# Patient Record
Sex: Female | Born: 1975
Health system: Southern US, Community
[De-identification: ages and names within clinical notes are randomized; demographics above are authoritative.]

## PROBLEM LIST (undated history)

## (undated) DIAGNOSIS — N289 Disorder of kidney and ureter, unspecified: Secondary | ICD-10-CM

## (undated) DIAGNOSIS — R102 Pelvic and perineal pain: Secondary | ICD-10-CM

## (undated) DIAGNOSIS — IMO0002 Reserved for concepts with insufficient information to code with codable children: Secondary | ICD-10-CM

## (undated) DIAGNOSIS — M4800 Spinal stenosis, site unspecified: Secondary | ICD-10-CM

## (undated) DIAGNOSIS — K219 Gastro-esophageal reflux disease without esophagitis: Secondary | ICD-10-CM

## (undated) DIAGNOSIS — M797 Fibromyalgia: Secondary | ICD-10-CM

## (undated) DIAGNOSIS — F509 Eating disorder, unspecified: Secondary | ICD-10-CM

## (undated) DIAGNOSIS — D6804 Acquired von Willebrand disease: Secondary | ICD-10-CM

## (undated) DIAGNOSIS — F909 Attention-deficit hyperactivity disorder, unspecified type: Secondary | ICD-10-CM

## (undated) DIAGNOSIS — M7072 Other bursitis of hip, left hip: Secondary | ICD-10-CM

## (undated) DIAGNOSIS — F4310 Post-traumatic stress disorder, unspecified: Secondary | ICD-10-CM

## (undated) DIAGNOSIS — M7140 Calcium deposit in bursa, unspecified site: Secondary | ICD-10-CM

## (undated) DIAGNOSIS — N840 Polyp of corpus uteri: Secondary | ICD-10-CM

## (undated) DIAGNOSIS — F319 Bipolar disorder, unspecified: Secondary | ICD-10-CM

## (undated) DIAGNOSIS — F419 Anxiety disorder, unspecified: Secondary | ICD-10-CM

## (undated) DIAGNOSIS — M7071 Other bursitis of hip, right hip: Secondary | ICD-10-CM

## (undated) DIAGNOSIS — J309 Allergic rhinitis, unspecified: Secondary | ICD-10-CM

## (undated) DIAGNOSIS — R87629 Unspecified abnormal cytological findings in specimens from vagina: Secondary | ICD-10-CM

## (undated) DIAGNOSIS — D680 Von Willebrand's disease: Secondary | ICD-10-CM

## (undated) DIAGNOSIS — N200 Calculus of kidney: Secondary | ICD-10-CM

## (undated) DIAGNOSIS — N946 Dysmenorrhea, unspecified: Secondary | ICD-10-CM

## (undated) HISTORY — PX: HYSTEROSCOPY: SHX211

## (undated) HISTORY — DX: Reserved for concepts with insufficient information to code with codable children: IMO0002

## (undated) HISTORY — DX: Post-traumatic stress disorder, unspecified: F43.10

## (undated) HISTORY — DX: Unspecified abnormal cytological findings in specimens from vagina: R87.629

## (undated) HISTORY — DX: Dysmenorrhea, unspecified: N94.6

## (undated) HISTORY — DX: Pelvic and perineal pain: R10.2

## (undated) HISTORY — DX: Von Willebrand's disease: D68.0

## (undated) HISTORY — DX: Bipolar disorder, unspecified: F31.9

## (undated) HISTORY — DX: Allergic rhinitis, unspecified: J30.9

## (undated) HISTORY — DX: Attention-deficit hyperactivity disorder, unspecified type: F90.9

## (undated) HISTORY — DX: Polyp of corpus uteri: N84.0

## (undated) HISTORY — DX: Acquired von Willebrand disease: D68.04

## (undated) HISTORY — DX: Eating disorder, unspecified: F50.9

## (undated) HISTORY — DX: Calculus of kidney: N20.0

## (undated) HISTORY — DX: Calcium deposit in bursa, unspecified site: M71.40

## (undated) HISTORY — DX: Gastro-esophageal reflux disease without esophagitis: K21.9

## (undated) HISTORY — DX: Fibromyalgia: M79.7

## (undated) HISTORY — PX: LAPAROSCOPY ABDOMEN DIAGNOSTIC: PRO50

## (undated) HISTORY — PX: ABDOMINAL HYSTERECTOMY: SHX81

## (undated) HISTORY — DX: Spinal stenosis, site unspecified: M48.00

## (undated) HISTORY — PX: OOPHORECTOMY: SHX86

## (undated) HISTORY — PX: TUBAL LIGATION: SHX77

## (undated) HISTORY — DX: Other bursitis of hip, right hip: M70.72

## (undated) HISTORY — DX: Other bursitis of hip, right hip: M70.71

## (undated) HISTORY — DX: Anxiety disorder, unspecified: F41.9

---

## 2005-08-13 ENCOUNTER — Ambulatory Visit: Payer: Self-pay | Admitting: Obstetrics and Gynecology

## 2006-02-06 ENCOUNTER — Ambulatory Visit: Payer: Self-pay | Admitting: Internal Medicine

## 2006-04-16 ENCOUNTER — Emergency Department: Payer: Self-pay | Admitting: Emergency Medicine

## 2008-01-17 ENCOUNTER — Emergency Department: Payer: Self-pay | Admitting: Emergency Medicine

## 2008-02-19 ENCOUNTER — Emergency Department: Payer: Self-pay | Admitting: Emergency Medicine

## 2008-08-23 ENCOUNTER — Inpatient Hospital Stay: Payer: Self-pay | Admitting: Unknown Physician Specialty

## 2008-12-06 ENCOUNTER — Encounter: Payer: Self-pay | Admitting: Orthopedic Surgery

## 2008-12-23 ENCOUNTER — Encounter: Payer: Self-pay | Admitting: Orthopedic Surgery

## 2009-08-29 ENCOUNTER — Ambulatory Visit: Payer: Self-pay | Admitting: Obstetrics and Gynecology

## 2009-09-13 ENCOUNTER — Ambulatory Visit: Payer: Self-pay | Admitting: Obstetrics and Gynecology

## 2009-09-16 ENCOUNTER — Ambulatory Visit: Payer: Self-pay | Admitting: Obstetrics and Gynecology

## 2009-10-20 ENCOUNTER — Ambulatory Visit: Payer: Self-pay | Admitting: Urology

## 2009-11-12 ENCOUNTER — Ambulatory Visit: Payer: Self-pay | Admitting: Neurological Surgery

## 2010-03-25 ENCOUNTER — Ambulatory Visit: Payer: Self-pay | Admitting: Oncology

## 2010-04-13 ENCOUNTER — Ambulatory Visit: Payer: Self-pay | Admitting: Oncology

## 2010-04-25 ENCOUNTER — Ambulatory Visit: Payer: Self-pay | Admitting: Oncology

## 2010-05-04 ENCOUNTER — Ambulatory Visit: Payer: Self-pay | Admitting: Oncology

## 2010-05-25 ENCOUNTER — Ambulatory Visit: Payer: Self-pay | Admitting: Oncology

## 2010-06-25 LAB — HM PAP SMEAR

## 2010-12-03 ENCOUNTER — Emergency Department: Payer: Self-pay | Admitting: Emergency Medicine

## 2011-06-05 ENCOUNTER — Encounter: Payer: Self-pay | Admitting: Internal Medicine

## 2011-06-05 ENCOUNTER — Ambulatory Visit (INDEPENDENT_AMBULATORY_CARE_PROVIDER_SITE_OTHER): Payer: Medicaid Other | Admitting: Internal Medicine

## 2011-06-05 VITALS — BP 120/76 | HR 94 | Temp 98.2°F | Wt 165.0 lb

## 2011-06-05 DIAGNOSIS — G5600 Carpal tunnel syndrome, unspecified upper limb: Secondary | ICD-10-CM

## 2011-06-05 DIAGNOSIS — F319 Bipolar disorder, unspecified: Secondary | ICD-10-CM

## 2011-06-05 MED ORDER — LAMOTRIGINE 25 MG PO TABS: 25.0000 mg | ORAL_TABLET | Freq: Two times a day (BID) | ORAL | Status: DC

## 2011-06-05 MED ORDER — MELOXICAM 15 MG PO TABS: 15.0000 mg | ORAL_TABLET | Freq: Every day | ORAL | Status: AC

## 2011-06-05 MED ORDER — ALPRAZOLAM 0.5 MG PO TABS: 0.5000 mg | ORAL_TABLET | Freq: Three times a day (TID) | ORAL | Status: DC | PRN

## 2011-06-05 NOTE — Progress Notes (Signed)
Subjective:    Patient ID: Analyssa Hodges, female    DOB: 1976-06-14, 35 y.o.   MRN: [id number]  HPI 35 year old female presents for an acute visit. She was seen in our former clinic that I have not seen her in over a year. She reports that she was recently diagnosed with bipolar disorder at mental health clinic. She notes that she has had intermittent episodes of severe depression alternating with episodes of increased energy and anxiety. She has a long history of panic attacks in her and her husband report that these have been more severe recently. She reports that she lost her job because of her inability to control her panic attacks and anxiety. She also separated from her husband for a brief period. She was instructed at the mental Health Center to start a prescription of Lamictal. She did not start this medication. She was concerned about potential side effects. She reports that she has followup scheduled with a psychiatrist on 06/29/2011.  She also notes a recent history of carpal tunnel syndrome. She notes that she was seen by an orthopedic surgeon for this yesterday. She reports that EMG studies are pending. She was instructed to start a prednisone taper but opted not to because she was concerned about potential side effects. She is currently taking ibuprofen for pain relief with minimal improvement.  She reports that she would like to be considered for disability and is interested in having me fill out paperwork so that she can obtain disability benefits because of her bipolar disorder.  Outpatient Encounter Prescriptions as of 06/05/2011  Medication Sig Dispense Refill  . ALPRAZolam (XANAX) 0.5 MG tablet Take 1 tablet (0.5 mg total) by mouth 3 (three) times daily as needed for sleep or anxiety.  90 tablet  0  . lamoTRIgine (LAMICTAL) 25 MG tablet Take 1 tablet (25 mg total) by mouth 2 (two) times daily.  60 tablet  2  . meloxicam (MOBIC) 15 MG tablet Take 1 tablet (15 mg total) by mouth daily.   30 tablet  2    Review of Systems  Constitutional: Negative for fever, chills, appetite change, fatigue and unexpected weight change.  HENT: Negative for ear pain, congestion, sore throat, trouble swallowing, neck pain, voice change and sinus pressure.   Eyes: Negative for visual disturbance.  Respiratory: Negative for cough and shortness of breath.   Cardiovascular: Negative for chest pain, palpitations and leg swelling.  Gastrointestinal: Negative for abdominal pain.  Genitourinary: Negative for dysuria and flank pain.  Musculoskeletal: Positive for myalgias and arthralgias. Negative for gait problem.  Skin: Negative for color change and rash.  Neurological: Negative for dizziness and headaches.  Hematological: Negative for adenopathy. Does not bruise/bleed easily.  Psychiatric/Behavioral: Positive for behavioral problems, sleep disturbance, dysphoric mood and agitation. Negative for suicidal ideas. The patient is nervous/anxious.    BP 120/76  Pulse 94  Temp(Src) 98.2 F (36.8 C) (Oral)  Wt 165 lb (74.844 kg)  SpO2 97%     Objective:   Physical Exam  Constitutional: She is oriented to person, place, and time. She appears well-developed and well-nourished. No distress.  HENT:  Head: Normocephalic and atraumatic.  Right Ear: External ear normal.  Left Ear: External ear normal.  Nose: Nose normal.  Mouth/Throat: Oropharynx is clear and moist. No oropharyngeal exudate.  Eyes: Conjunctivae are normal. Pupils are equal, round, and reactive to light. Right eye exhibits no discharge. Left eye exhibits no discharge. No scleral icterus.  Neck: Normal range of motion. Neck supple.  No tracheal deviation present. No thyromegaly present.  Cardiovascular: Normal rate, regular rhythm, normal heart sounds and intact distal pulses.  Exam reveals no gallop and no friction rub.   No murmur heard. Pulmonary/Chest: Effort normal and breath sounds normal. No respiratory distress. She has no  wheezes. She has no rales. She exhibits no tenderness.  Musculoskeletal: Normal range of motion. She exhibits no edema and no tenderness.  Lymphadenopathy:    She has no cervical adenopathy.  Neurological: She is alert and oriented to person, place, and time. No cranial nerve deficit. She exhibits normal muscle tone. Coordination normal.  Skin: Skin is warm and dry. No rash noted. She is not diaphoretic. No erythema. No pallor.  Psychiatric: She has a normal mood and affect. Her behavior is normal. Judgment and thought content normal.          Assessment & Plan:  1. Bipolar disorder - Per pt, was diagnosed at Mental Health facility.  Encouraged her to take Lamictal as instructed. Will alprazolam as needed for severe episodes of panic. She has taken this medication in the past and tolerated it well. Encouraged her to followup with psychiatry. She will followup here in one month.  2. Carpal Tunnel syndrome - she is taking an excessive amount of ibuprofen. Will change to meloxicam. She will continue to follow with orthopedics. She will followup here in one month.

## 2011-07-06 ENCOUNTER — Ambulatory Visit: Payer: Medicaid Other | Admitting: Internal Medicine

## 2011-07-30 ENCOUNTER — Telehealth: Payer: Self-pay | Admitting: Internal Medicine

## 2011-07-30 DIAGNOSIS — F319 Bipolar disorder, unspecified: Secondary | ICD-10-CM

## 2011-07-30 MED ORDER — ALPRAZOLAM 0.5 MG PO TABS: 0.5000 mg | ORAL_TABLET | Freq: Three times a day (TID) | ORAL | Status: DC | PRN

## 2011-07-30 NOTE — Telephone Encounter (Signed)
Did she follow up with psychiatry? She did not keep follow up here.  We can refill one month only.

## 2011-08-01 NOTE — Telephone Encounter (Signed)
Left pt detailed VM

## 2012-03-16 ENCOUNTER — Emergency Department: Payer: Self-pay | Admitting: Emergency Medicine

## 2012-03-17 LAB — COMPREHENSIVE METABOLIC PANEL
Albumin: 3.5 g/dL (ref 3.4–5.0)
BUN: 12 mg/dL (ref 7–18)
Bilirubin,Total: 0.2 mg/dL (ref 0.2–1.0)
Calcium, Total: 9 mg/dL (ref 8.5–10.1)
Chloride: 107 mmol/L (ref 98–107)
EGFR (African American): >60
Glucose: 88 mg/dL (ref 65–99)
Osmolality: 277 (ref 275–301)
Potassium: 3.7 mmol/L (ref 3.5–5.1)
SGOT(AST): 21 U/L (ref 15–37)
Sodium: 139 mmol/L (ref 136–145)

## 2012-03-17 LAB — URINALYSIS, COMPLETE
Bilirubin,UR: NEGATIVE
Glucose,UR: NEGATIVE mg/dL (ref 0–75)
Ketone: NEGATIVE
Leukocyte Esterase: NEGATIVE
Nitrite: NEGATIVE
Ph: 5 (ref 4.5–8.0)
Specific Gravity: 1.028 (ref 1.003–1.030)
Squamous Epithelial: 5
WBC UR: 1 /HPF (ref 0–5)

## 2012-03-17 LAB — CBC
MCH: 30.5 pg (ref 26.0–34.0)
MCHC: 34.3 g/dL (ref 32.0–36.0)
MCV: 89 fL (ref 80–100)
Platelet: 244 10*3/uL (ref 150–440)
RBC: 4.8 10*6/uL (ref 3.80–5.20)
RDW: 13 % (ref 11.5–14.5)
WBC: 9.7 10*3/uL (ref 3.6–11.0)

## 2012-03-17 LAB — PREGNANCY, URINE: Pregnancy Test, Urine: NEGATIVE m[IU]/mL

## 2012-04-06 ENCOUNTER — Emergency Department: Payer: Self-pay | Admitting: Emergency Medicine

## 2012-04-06 LAB — COMPREHENSIVE METABOLIC PANEL
Albumin: 3.6 g/dL (ref 3.4–5.0)
Alkaline Phosphatase: 83 U/L (ref 50–136)
BUN: 11 mg/dL (ref 7–18)
Bilirubin,Total: 0.4 mg/dL (ref 0.2–1.0)
Chloride: 110 mmol/L — ABNORMAL HIGH (ref 98–107)
Co2: 22 mmol/L (ref 21–32)
EGFR (African American): >60
EGFR (Non-African Amer.): >60
SGOT(AST): 39 U/L — ABNORMAL HIGH (ref 15–37)
SGPT (ALT): 46 U/L (ref 12–78)
Total Protein: 7.6 g/dL (ref 6.4–8.2)

## 2012-04-06 LAB — URINALYSIS, COMPLETE
Bacteria: NONE SEEN
Bilirubin,UR: NEGATIVE
Blood: NEGATIVE
Ketone: NEGATIVE
Nitrite: NEGATIVE
Ph: 8 (ref 4.5–8.0)
Protein: NEGATIVE
RBC,UR: 7 /HPF (ref 0–5)
Specific Gravity: 1.024 (ref 1.003–1.030)
WBC UR: 2 /HPF (ref 0–5)

## 2012-04-06 LAB — CBC
HGB: 14.4 g/dL (ref 12.0–16.0)
MCH: 29.6 pg (ref 26.0–34.0)
MCHC: 33.8 g/dL (ref 32.0–36.0)
Platelet: 278 10*3/uL (ref 150–440)
RBC: 4.86 10*6/uL (ref 3.80–5.20)
RDW: 12.7 % (ref 11.5–14.5)
WBC: 7.2 10*3/uL (ref 3.6–11.0)

## 2012-05-16 ENCOUNTER — Emergency Department: Payer: Self-pay | Admitting: Emergency Medicine

## 2012-05-16 LAB — URINALYSIS, COMPLETE
Bilirubin,UR: NEGATIVE
Glucose,UR: NEGATIVE mg/dL (ref 0–75)
Ketone: NEGATIVE
Leukocyte Esterase: NEGATIVE
Nitrite: POSITIVE
Specific Gravity: 1.026 (ref 1.003–1.030)
Squamous Epithelial: 1
WBC UR: 3 /HPF (ref 0–5)

## 2012-05-16 LAB — BASIC METABOLIC PANEL
Calcium, Total: 9.2 mg/dL (ref 8.5–10.1)
Chloride: 107 mmol/L (ref 98–107)
Co2: 26 mmol/L (ref 21–32)
Osmolality: 274 (ref 275–301)
Potassium: 4.1 mmol/L (ref 3.5–5.1)

## 2012-05-16 LAB — CBC
HCT: 40.6 % (ref 35.0–47.0)
Platelet: 236 10*3/uL (ref 150–440)
RBC: 4.62 10*6/uL (ref 3.80–5.20)
RDW: 12.9 % (ref 11.5–14.5)
WBC: 6.8 10*3/uL (ref 3.6–11.0)

## 2012-05-23 ENCOUNTER — Encounter: Payer: Self-pay | Admitting: Internal Medicine

## 2012-05-23 ENCOUNTER — Ambulatory Visit (INDEPENDENT_AMBULATORY_CARE_PROVIDER_SITE_OTHER): Payer: Medicaid Other | Admitting: Internal Medicine

## 2012-05-23 ENCOUNTER — Telehealth: Payer: Self-pay | Admitting: Internal Medicine

## 2012-05-23 VITALS — BP 122/88 | HR 82 | Temp 98.1°F | Resp 16 | Ht 62.0 in | Wt 170.5 lb

## 2012-05-23 DIAGNOSIS — IMO0001 Reserved for inherently not codable concepts without codable children: Secondary | ICD-10-CM

## 2012-05-23 DIAGNOSIS — G8929 Other chronic pain: Secondary | ICD-10-CM | POA: Insufficient documentation

## 2012-05-23 DIAGNOSIS — N200 Calculus of kidney: Secondary | ICD-10-CM

## 2012-05-23 DIAGNOSIS — R102 Pelvic and perineal pain unspecified side: Secondary | ICD-10-CM

## 2012-05-23 DIAGNOSIS — M797 Fibromyalgia: Secondary | ICD-10-CM

## 2012-05-23 DIAGNOSIS — N949 Unspecified condition associated with female genital organs and menstrual cycle: Secondary | ICD-10-CM

## 2012-05-23 DIAGNOSIS — F319 Bipolar disorder, unspecified: Secondary | ICD-10-CM

## 2012-05-23 HISTORY — DX: Calculus of kidney: N20.0

## 2012-05-23 LAB — POCT URINALYSIS DIPSTICK
Glucose, UA: NEGATIVE
Nitrite, UA: NEGATIVE
Protein, UA: NEGATIVE
Spec Grav, UA: >=1.03
Urobilinogen, UA: 0.2

## 2012-05-23 MED ORDER — ALPRAZOLAM 0.5 MG PO TABS: 0.5000 mg | ORAL_TABLET | Freq: Three times a day (TID) | ORAL | Status: DC | PRN

## 2012-05-23 MED ORDER — HYDROCODONE-ACETAMINOPHEN 5-325 MG PO TABS: 1.0000 | ORAL_TABLET | Freq: Four times a day (QID) | ORAL | Status: DC | PRN

## 2012-05-23 MED ORDER — CIPROFLOXACIN HCL 500 MG PO TABS: 500.0000 mg | ORAL_TABLET | Freq: Two times a day (BID) | ORAL | Status: DC

## 2012-05-23 MED ORDER — ALPRAZOLAM 0.5 MG PO TABS: 0.5000 mg | ORAL_TABLET | Freq: Three times a day (TID) | ORAL | Status: AC | PRN

## 2012-05-23 NOTE — Assessment & Plan Note (Signed)
Pt with chronic right lower pelvic pain. S/p recent CT abd/pel at [facility], which pt reports was normal except for noted nephrolithasis. Also had laparoscopy in the past to evaluate symptoms and this was normal per her report. Will request previous records on CT and surgical evaluation.  Will also set up urogynecology evaluation.

## 2012-05-23 NOTE — Telephone Encounter (Signed)
I called patient to inform her that she would need to come and pay $50 before we could fax paperwork to [facility].

## 2012-05-23 NOTE — Assessment & Plan Note (Signed)
Recent diagnosis of nephrolithiasis in ED. Treated with Cipro and Hydrocodone. Continues to have right sided flank pain. Will repeat urinalysis today. Will request records on CT abdomen/pel and urinalysis from [facility]. Will set up urogynecology evaluation for nephrolithiasis and chronic right lower pelvic pain. Refill given on hydrocodone today.

## 2012-05-23 NOTE — Assessment & Plan Note (Signed)
Pt reports symptoms are controlled off medication. Currently using only xanax prn. Will continue.

## 2012-05-23 NOTE — Assessment & Plan Note (Signed)
Symptoms of chronic, diffuse pain with trigger points most consistent with fibromyalgia. We discussed initiating workup and evaluation including labs to look for inflammatory conditions such as lupus or RA, however pt does not currently have health insurance, so will hold off until coverage established for labs and rheumatology evaluation. Would favor adding Cymbalta if symptoms are persistent, interfering with quality of life. For now, will hold off, as addressing acute issue of nephrolithiasis and treating pain symptoms with hydrocodone.

## 2012-05-23 NOTE — Telephone Encounter (Signed)
Patient and her husband came by to discuss the $85.  I took patient and husband into exam room to discuss the payment.  Patient said that it wasn't told to her at time of visit that she would have to pay for the forms to be filled out and that she didn't have to last year. Husband used fowl language: "This is bullshit that we are charging an office visit and for the forms to be filled out". "We should find a cheaper doctor", mine would have filled the paperwork out at my visit". I explained to both the patient and husband that the charge was for Dr. Tilman Neat time for filling out paperwork.  You could tell by his body language that he wasn't happy and he kept saying that he couldn't afford it he just paid $60 for prescriptions.  I told patient that we would fax paperwork to [facility] and that she needed to come by on Thursday December 5th to pay for the forms.  We went ahead and faxed since patient was scared that she wouldn't be able to drive because she had 30 days to get the paperwork back to them. She apologized for her husbands actions and the way he was acting.

## 2012-05-23 NOTE — Telephone Encounter (Signed)
Please discharge pt from practice.
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
## 2012-05-27 ENCOUNTER — Telehealth: Payer: Self-pay | Admitting: Internal Medicine

## 2012-05-27 NOTE — Telephone Encounter (Signed)
NO. Absolutely not. If she has gone through an entire Rx for Hydrocodone in 5 DAYS!! Then she will need to be seen in the ED or urgent care.  She may have obstructing kidney stone, which needs to be addressed sooner than 12/20

## 2012-05-27 NOTE — Telephone Encounter (Signed)
Patient Information:  Caller Name: Maraki  Phone: [phone]  Patient: Casey Hodges, Casey Hodges  Gender: Female  DOB: 1976-01-14  Age: 36 Years  PCP: Ronna Polio (Adults only)  Pregnant: No   Symptoms  Reason For Call & Symptoms: Wants Rx for pain.  seen in office 05/23/12 for bilateral kidney stones.  Seen in ED 05/16/12.  Has appt with urogynecologist at [facility] 06/13/12.  Taking cipro and having blood in urine; states is almost out of hydrocodone now.  Wants refill.or something stronger.  Reviewed Health History In EMR: Yes  Reviewed Medications In EMR: Yes  Reviewed Allergies In EMR: Yes  Reviewed Surgeries / Procedures: Yes  Date of Onset of Symptoms: 05/16/2012 OB:  LMP: Unknown  Guideline(s) Used:  Flank Pain  Disposition Per Guideline:   See Today in Office  Reason For Disposition Reached:   Moderate pain (e.g., interferes with normal activities or awakens from sleep)  Advice Given:  N/A  Office Follow Up:  Does the office need to follow up with this patient?: Yes  Instructions For The Office: Requests Rx change/refill till can be seen by specialist 06/13/12 krs/can  Patient Refused Recommendation:  Patient Requests Prescription  Requests Rx change/refill till can be seen by specialist 06/13/12 krs/can  RN Note:  States pain is "unrelenting," and cannot sleep well.  States pain is not new, changed, or otherwise emergent.  Per protocol, advised recheck; declines appt at this time.  States would like change in Rx or refill to last till can be seen by specialist 06/13/12.  Info to office for provider review/Rx/callback.  May reach patient at [phone].  krs/can

## 2012-05-27 NOTE — Telephone Encounter (Signed)
I have filled out the paperwork and waiting for Dr. Dan Humphreys to finish her part.

## 2012-05-28 NOTE — Telephone Encounter (Signed)
Pt advised that she could get no more refills and that she needed to go to ED or Urgent care to rule out Kidney stone obstruction.

## 2012-05-29 NOTE — Telephone Encounter (Signed)
I have sent form to [facility] medical records department.

## 2012-07-29 ENCOUNTER — Emergency Department: Payer: Self-pay | Admitting: Emergency Medicine

## 2012-07-29 LAB — URINALYSIS, COMPLETE
Glucose,UR: NEGATIVE mg/dL (ref 0–75)
Ketone: NEGATIVE
Leukocyte Esterase: NEGATIVE
Nitrite: POSITIVE
Ph: 6 (ref 4.5–8.0)
Protein: NEGATIVE
RBC,UR: 3 /HPF (ref 0–5)

## 2012-07-29 LAB — CBC
HCT: 40.2 % (ref 35.0–47.0)
HGB: 13.2 g/dL (ref 12.0–16.0)
MCH: 28.9 pg (ref 26.0–34.0)
MCV: 88 fL (ref 80–100)
Platelet: 251 10*3/uL (ref 150–440)
RBC: 4.56 10*6/uL (ref 3.80–5.20)

## 2012-07-29 LAB — BASIC METABOLIC PANEL
Anion Gap: 6 — ABNORMAL LOW (ref 7–16)
BUN: 10 mg/dL (ref 7–18)
Chloride: 111 mmol/L — ABNORMAL HIGH (ref 98–107)
Creatinine: 0.72 mg/dL (ref 0.60–1.30)
EGFR (African American): >60
EGFR (Non-African Amer.): >60
Osmolality: 281 (ref 275–301)

## 2012-07-29 LAB — PREGNANCY, URINE: Pregnancy Test, Urine: NEGATIVE m[IU]/mL

## 2012-07-30 ENCOUNTER — Encounter: Payer: Self-pay | Admitting: Internal Medicine

## 2012-08-06 ENCOUNTER — Telehealth: Payer: Self-pay | Admitting: Internal Medicine

## 2012-08-06 NOTE — Telephone Encounter (Addendum)
Patient dismissed from [facility] by Ronna Polio, MD effective 05/28/2012. Dismissal letter sent out by certified / registered mail. rmf  Certified letter returned as undeliverable after three attempts, change of address listed on last sticker. Letter remailed to [address], [place], [place] [address]. rmf

## 2012-10-20 ENCOUNTER — Telehealth: Payer: Self-pay | Admitting: *Deleted

## 2012-10-20 NOTE — Telephone Encounter (Signed)
Pt called, requesting referral to rheumatologist for fibromyalgia. Please advise.

## 2012-10-20 NOTE — Telephone Encounter (Signed)
This pt has been discharged from the practice in 05/2012. She will need to establish with another primary care provider for referral.

## 2012-10-24 NOTE — Telephone Encounter (Signed)
Patient called back to check on the status of the referral and I advised her of the information below that says she had been discharged from the practice in December and she would need to find another PCP to refer her to the rheumatologist.  She did want to know the reason why she had been discharged. I advised patient I wasn't aware of the reason.

## 2013-03-17 ENCOUNTER — Emergency Department: Payer: Self-pay | Admitting: Emergency Medicine

## 2013-03-17 LAB — URINALYSIS, COMPLETE
Bilirubin,UR: NEGATIVE
Glucose,UR: NEGATIVE mg/dL (ref 0–75)
Ketone: NEGATIVE
Leukocyte Esterase: NEGATIVE
Nitrite: NEGATIVE
Ph: 6 (ref 4.5–8.0)
RBC,UR: 7 /HPF (ref 0–5)
Specific Gravity: 1.019 (ref 1.003–1.030)
Squamous Epithelial: 9
WBC UR: 4 /HPF (ref 0–5)

## 2013-03-17 LAB — CBC
HGB: 14.2 g/dL (ref 12.0–16.0)
Platelet: 267 10*3/uL (ref 150–440)
RBC: 4.78 10*6/uL (ref 3.80–5.20)
RDW: 13 % (ref 11.5–14.5)
WBC: 9.1 10*3/uL (ref 3.6–11.0)

## 2013-03-17 LAB — BASIC METABOLIC PANEL
Anion Gap: 5 — ABNORMAL LOW (ref 7–16)
BUN: 8 mg/dL (ref 7–18)
Calcium, Total: 9 mg/dL (ref 8.5–10.1)
Chloride: 109 mmol/L — ABNORMAL HIGH (ref 98–107)
Co2: 24 mmol/L (ref 21–32)
EGFR (African American): >60
Glucose: 103 mg/dL — ABNORMAL HIGH (ref 65–99)
Osmolality: 274 (ref 275–301)
Potassium: 3.6 mmol/L (ref 3.5–5.1)

## 2013-05-15 ENCOUNTER — Emergency Department: Payer: Self-pay | Admitting: Emergency Medicine

## 2013-05-15 LAB — COMPREHENSIVE METABOLIC PANEL
Albumin: 3.6 g/dL (ref 3.4–5.0)
Alkaline Phosphatase: 77 U/L (ref 50–136)
Anion Gap: 3 — ABNORMAL LOW (ref 7–16)
BUN: 10 mg/dL (ref 7–18)
Calcium, Total: 8.8 mg/dL (ref 8.5–10.1)
EGFR (African American): >60
EGFR (Non-African Amer.): >60
Glucose: 86 mg/dL (ref 65–99)
SGOT(AST): 25 U/L (ref 15–37)
SGPT (ALT): 37 U/L (ref 12–78)
Sodium: 139 mmol/L (ref 136–145)
Total Protein: 7.4 g/dL (ref 6.4–8.2)

## 2013-05-15 LAB — CBC
HCT: 42.2 % (ref 35.0–47.0)
MCH: 29.6 pg (ref 26.0–34.0)
MCHC: 33.6 g/dL (ref 32.0–36.0)
MCV: 88 fL (ref 80–100)
Platelet: 245 10*3/uL (ref 150–440)
RBC: 4.78 10*6/uL (ref 3.80–5.20)
WBC: 7.1 10*3/uL (ref 3.6–11.0)

## 2013-05-15 LAB — URINALYSIS, COMPLETE
Nitrite: POSITIVE
Protein: NEGATIVE
RBC,UR: 6 /HPF (ref 0–5)
Specific Gravity: 1.021 (ref 1.003–1.030)
Squamous Epithelial: 33

## 2013-05-15 LAB — DRUG SCREEN, URINE
Barbiturates, Ur Screen: NEGATIVE (ref ?–200)
Benzodiazepine, Ur Scrn: POSITIVE (ref ?–200)
Cannabinoid 50 Ng, Ur ~~LOC~~: POSITIVE (ref ?–50)
MDMA (Ecstasy)Ur Screen: NEGATIVE (ref ?–500)
Methadone, Ur Screen: NEGATIVE (ref ?–300)
Opiate, Ur Screen: NEGATIVE (ref ?–300)
Phencyclidine (PCP) Ur S: NEGATIVE (ref ?–25)
Tricyclic, Ur Screen: NEGATIVE (ref ?–1000)

## 2013-05-17 LAB — URINE CULTURE

## 2013-05-25 ENCOUNTER — Emergency Department: Payer: Self-pay | Admitting: Emergency Medicine

## 2013-05-25 LAB — COMPREHENSIVE METABOLIC PANEL
Albumin: 3.6 g/dL (ref 3.4–5.0)
Alkaline Phosphatase: 78 U/L
Anion Gap: 4 — ABNORMAL LOW (ref 7–16)
Bilirubin,Total: 0.3 mg/dL (ref 0.2–1.0)
Calcium, Total: 9.8 mg/dL (ref 8.5–10.1)
Chloride: 108 mmol/L — ABNORMAL HIGH (ref 98–107)
Co2: 25 mmol/L (ref 21–32)
Creatinine: 0.77 mg/dL (ref 0.60–1.30)
EGFR (African American): >60
EGFR (Non-African Amer.): >60
Glucose: 82 mg/dL (ref 65–99)
Osmolality: 273 (ref 275–301)
Potassium: 3.6 mmol/L (ref 3.5–5.1)
SGOT(AST): 23 U/L (ref 15–37)
SGPT (ALT): 32 U/L (ref 12–78)
Sodium: 137 mmol/L (ref 136–145)

## 2013-05-25 LAB — CBC
HCT: 40.9 % (ref 35.0–47.0)
HGB: 13.7 g/dL (ref 12.0–16.0)
MCH: 29.4 pg (ref 26.0–34.0)
MCHC: 33.5 g/dL (ref 32.0–36.0)
MCV: 88 fL (ref 80–100)
Platelet: 260 10*3/uL (ref 150–440)
RDW: 13.2 % (ref 11.5–14.5)

## 2013-05-25 LAB — URINALYSIS, COMPLETE
Bacteria: NONE SEEN
Ketone: NEGATIVE
Leukocyte Esterase: NEGATIVE
Nitrite: NEGATIVE
Ph: 6 (ref 4.5–8.0)
Protein: NEGATIVE
RBC,UR: 17 /HPF (ref 0–5)
Specific Gravity: 1.024 (ref 1.003–1.030)
Squamous Epithelial: 15
WBC UR: 1 /HPF (ref 0–5)

## 2013-05-26 ENCOUNTER — Emergency Department: Payer: Self-pay | Admitting: Emergency Medicine

## 2013-05-29 ENCOUNTER — Emergency Department (HOSPITAL_COMMUNITY)
Admission: EM | Admit: 2013-05-29 | Discharge: 2013-05-29 | Disposition: A | Discharge status: Home / Self Care | Payer: Medicaid Other | Attending: Emergency Medicine | Admitting: Emergency Medicine

## 2013-05-29 ENCOUNTER — Encounter (HOSPITAL_COMMUNITY): Payer: Self-pay | Admitting: Emergency Medicine

## 2013-05-29 DIAGNOSIS — Z8742 Personal history of other diseases of the female genital tract: Secondary | ICD-10-CM | POA: Insufficient documentation

## 2013-05-29 DIAGNOSIS — R109 Unspecified abdominal pain: Secondary | ICD-10-CM | POA: Insufficient documentation

## 2013-05-29 DIAGNOSIS — R6883 Chills (without fever): Secondary | ICD-10-CM | POA: Insufficient documentation

## 2013-05-29 DIAGNOSIS — Z87448 Personal history of other diseases of urinary system: Secondary | ICD-10-CM | POA: Insufficient documentation

## 2013-05-29 DIAGNOSIS — Z9861 Coronary angioplasty status: Secondary | ICD-10-CM | POA: Insufficient documentation

## 2013-05-29 DIAGNOSIS — R110 Nausea: Secondary | ICD-10-CM | POA: Insufficient documentation

## 2013-05-29 DIAGNOSIS — Z87442 Personal history of urinary calculi: Secondary | ICD-10-CM | POA: Insufficient documentation

## 2013-05-29 DIAGNOSIS — Z8659 Personal history of other mental and behavioral disorders: Secondary | ICD-10-CM | POA: Insufficient documentation

## 2013-05-29 DIAGNOSIS — F17200 Nicotine dependence, unspecified, uncomplicated: Secondary | ICD-10-CM | POA: Insufficient documentation

## 2013-05-29 DIAGNOSIS — Z3202 Encounter for pregnancy test, result negative: Secondary | ICD-10-CM | POA: Insufficient documentation

## 2013-05-29 LAB — CBC WITH DIFFERENTIAL/PLATELET
Basophils Absolute: 0 10*3/uL (ref 0.0–0.1)
Basophils Relative: 0 % (ref 0–1)
Eosinophils Relative: 1 % (ref 0–5)
Lymphocytes Relative: 44 % (ref 12–46)
MCHC: 33.8 g/dL (ref 30.0–36.0)
Monocytes Absolute: 0.5 10*3/uL (ref 0.1–1.0)
Neutro Abs: 4.4 10*3/uL (ref 1.7–7.7)
Neutrophils Relative %: 49 % (ref 43–77)
Platelets: 246 10*3/uL (ref 150–400)
RBC: 4.38 MIL/uL (ref 3.87–5.11)
RDW: 13.1 % (ref 11.5–15.5)
WBC: 9 10*3/uL (ref 4.0–10.5)

## 2013-05-29 LAB — COMPREHENSIVE METABOLIC PANEL
ALT: 19 U/L (ref 0–35)
AST: 21 U/L (ref 0–37)
Albumin: 3.3 g/dL — ABNORMAL LOW (ref 3.5–5.2)
CO2: 23 mEq/L (ref 19–32)
Calcium: 9.1 mg/dL (ref 8.4–10.5)
Chloride: 104 mEq/L (ref 96–112)
Creatinine, Ser: 0.68 mg/dL (ref 0.50–1.10)
GFR calc non Af Amer: >90 mL/min (ref >90)
Potassium: 3.8 mEq/L (ref 3.5–5.1)
Sodium: 137 mEq/L (ref 135–145)
Total Bilirubin: 0.1 mg/dL — ABNORMAL LOW (ref 0.3–1.2)
Total Protein: 6.8 g/dL (ref 6.0–8.3)

## 2013-05-29 LAB — POCT PREGNANCY, URINE: Preg Test, Ur: NEGATIVE

## 2013-05-29 LAB — URINALYSIS, ROUTINE W REFLEX MICROSCOPIC
Glucose, UA: NEGATIVE mg/dL
Hgb urine dipstick: NEGATIVE
Leukocytes, UA: NEGATIVE
Protein, ur: NEGATIVE mg/dL
Specific Gravity, Urine: 1.021 (ref 1.005–1.030)
Urobilinogen, UA: 0.2 mg/dL (ref 0.0–1.0)

## 2013-05-29 MED ORDER — OXYCODONE-ACETAMINOPHEN 5-325 MG PO TABS: 1.0000 | ORAL_TABLET | ORAL | Status: DC | PRN

## 2013-05-29 MED ORDER — TAMSULOSIN HCL 0.4 MG PO CAPS: 0.4000 mg | ORAL_CAPSULE | Freq: Every day | ORAL | Status: DC

## 2013-05-29 MED ORDER — IBUPROFEN 800 MG PO TABS: 800.0000 mg | ORAL_TABLET | Freq: Three times a day (TID) | ORAL | Status: DC

## 2013-05-29 MED ORDER — PROMETHAZINE HCL 25 MG PO TABS: 25.0000 mg | ORAL_TABLET | Freq: Four times a day (QID) | ORAL | Status: DC | PRN

## 2013-05-29 MED ORDER — SODIUM CHLORIDE 0.9 % IV BOLUS (SEPSIS)
1000.0000 mL | Freq: Once | INTRAVENOUS | Status: AC
  Administered 2013-05-29: 1000 mL via INTRAVENOUS

## 2013-05-29 MED ORDER — KETOROLAC TROMETHAMINE 30 MG/ML IJ SOLN
30.0000 mg | Freq: Once | INTRAMUSCULAR | Status: AC
  Administered 2013-05-29: 30 mg via INTRAVENOUS
  Filled 2013-05-29: qty 1

## 2013-05-29 MED ORDER — MORPHINE SULFATE 4 MG/ML IJ SOLN
4.0000 mg | Freq: Once | INTRAMUSCULAR | Status: AC
  Administered 2013-05-29: 4 mg via INTRAVENOUS
  Filled 2013-05-29: qty 1

## 2013-05-29 MED ORDER — ONDANSETRON HCL 4 MG/2ML IJ SOLN
4.0000 mg | Freq: Once | INTRAMUSCULAR | Status: AC
  Administered 2013-05-29: 4 mg via INTRAVENOUS
  Filled 2013-05-29: qty 2

## 2013-05-29 NOTE — ED Notes (Signed)
Per pt recently treated for kidney infection. sts that she is done with the abx and still in a lot of pain. sts lower back, flank and pelvic pain.

## 2013-05-29 NOTE — ED Provider Notes (Signed)
TIME SEEN: 8:51 PM  CHIEF COMPLAINT: Left-sided flank pain  HPI: Patient is a 37 year old female with a history of ovarian cysts, recurrent polynephritis and kidney stones who has had one stent placed 16 years ago who presents the emergency department with complaints of left-sided flank pain. She reports that 2 weeks ago she was diagnosed with right-sided pyelonephritis and finished a course of Cipro on Sunday, 5 days ago. She reports that 3 days ago she began having return of her pain and went to [facility] and was diagnosed with a right-sided kidney stone. She reports that yesterday morning she began having sharp left-sided flank pain. She's also had chills and nausea but no vomiting or diarrhea. No dysuria or hematuria. No vaginal bleeding or discharge. She states her symptoms are similar to her prior kidney stones. Her pain radiates into her lower abdomen.  ROS: See HPI Constitutional: no fever  Eyes: no drainage  ENT: no runny nose   Cardiovascular:  no chest pain  Resp: no SOB  GI: no vomiting GU: no dysuria Integumentary: no rash  Allergy: no hives  Musculoskeletal: no leg swelling  Neurological: no slurred speech ROS otherwise negative  PAST MEDICAL HISTORY/PAST SURGICAL HISTORY:  Past Medical History  Diagnosis Date  . Bipolar affective     pt reported  . Uterine polyp   . Eating disorder     Under control per patient    MEDICATIONS:  Prior to Admission medications   Medication Sig Start Date End Date Taking? Authorizing Provider  ibuprofen (ADVIL,MOTRIN) 200 MG tablet Take 400 mg by mouth 2 (two) times daily as needed (pain).   Yes Historical Provider, MD  Melatonin 5 MG TABS Take 10 mg by mouth at bedtime.   Yes Historical Provider, MD  ciprofloxacin (CIPRO) 500 MG tablet Take 500 mg by mouth 2 (two) times daily. 7 day course completed 05/24/2013    Historical Provider, MD    ALLERGIES:  Allergies  Allergen Reactions  . Wellbutrin [Bupropion  Hcl] Other (See Comments)    Mental breakdown? Suicidal   . Lasix [Furosemide] Rash  . Nitrofurantoin Monohyd Macro Rash    SOCIAL HISTORY:  History  Substance Use Topics  . Smoking status: Current Every Day Smoker -- 1.00 packs/day  . Smokeless tobacco: Never Used  . Alcohol Use: Yes     Comment: Occasional    FAMILY HISTORY: History reviewed. No pertinent family history.  EXAM: BP 96/61  Pulse 85  Temp(Src) 97.2 F (36.2 C) (Oral)  Resp 16  Ht 5\' 2"  (1.575 m)  Wt 167 lb (75.751 kg)  BMI 30.54 kg/m2  SpO2 98% CONSTITUTIONAL: Alert and oriented and responds appropriately to questions. Well-appearing; well-nourished, appears uncomfortable on exam HEAD: Normocephalic EYES: Conjunctivae clear, PERRL ENT: normal nose; no rhinorrhea; moist mucous membranes; pharynx without lesions noted NECK: Supple, no meningismus, no LAD  CARD: RRR; S1 and S2 appreciated; no murmurs, no clicks, no rubs, no gallops RESP: Normal chest excursion without splinting or tachypnea; breath sounds clear and equal bilaterally; no wheezes, no rhonchi, no rales,  ABD/GI: Normal bowel sounds; non-distended; soft, non-tender, no rebound, no guarding BACK:  The back appears normal and is non-tender to palpation, right-sided mild CVA tenderness; no midline spinal tenderness, step-off or deformity EXT: Normal ROM in all joints; non-tender to palpation; no edema; normal capillary refill; no cyanosis    SKIN: Normal color for age and race; warm NEURO: Moves all extremities equally PSYCH: The patient's mood and manner are  appropriate. Grooming and personal hygiene are appropriate.  MEDICAL DECISION MAKING: Patient here with right-sided flank pain. She has a history of recurrent pyelonephritis and kidney stones. She appears uncomfortable on exam but nontoxic and is hemodynamically stable. Urine pregnancy test is negative and urine shows no blood or sign of infection. We'll check basic labs, give IV fluids,  antiemetics, pain medication and reassess. Patient has had multiple CT scans in the past. We'll attempt to avoid repeat imaging at this time if possible.  ED PROGRESS: Labs are unremarkable. Patient reports her pain is completely resolved after one dose of Toradol, morphine, Zofran. She reports she would like to be discharged home. We'll discharge with prescription for ibuprofen, Percocet, Flomax, Phenergan. Given return precautions. Patient verbalized understanding and is comfortable plan.     Layla Maw Nitesh Pitstick, DO 05/29/13 2240

## 2013-06-25 HISTORY — PX: LAPAROSCOPIC VAGINAL HYSTERECTOMY: SUR798

## 2013-09-18 ENCOUNTER — Emergency Department: Payer: Self-pay | Admitting: Emergency Medicine

## 2013-09-18 LAB — COMPREHENSIVE METABOLIC PANEL
ANION GAP: 6 — AB (ref 7–16)
AST: 29 U/L (ref 15–37)
Albumin: 3.6 g/dL (ref 3.4–5.0)
Alkaline Phosphatase: 89 U/L
BUN: 13 mg/dL (ref 7–18)
Bilirubin,Total: 0.2 mg/dL (ref 0.2–1.0)
CALCIUM: 9.3 mg/dL (ref 8.5–10.1)
CREATININE: 0.86 mg/dL (ref 0.60–1.30)
Chloride: 105 mmol/L (ref 98–107)
Co2: 26 mmol/L (ref 21–32)
EGFR (Non-African Amer.): >60
GLUCOSE: 92 mg/dL (ref 65–99)
Osmolality: 274 (ref 275–301)
POTASSIUM: 3.6 mmol/L (ref 3.5–5.1)
SGPT (ALT): 54 U/L (ref 12–78)
Sodium: 137 mmol/L (ref 136–145)
Total Protein: 7.9 g/dL (ref 6.4–8.2)

## 2013-09-18 LAB — URINALYSIS, COMPLETE
BILIRUBIN, UR: NEGATIVE
Glucose,UR: NEGATIVE mg/dL (ref 0–75)
Ketone: NEGATIVE
LEUKOCYTE ESTERASE: NEGATIVE
Nitrite: NEGATIVE
Ph: 6 (ref 4.5–8.0)
Protein: NEGATIVE
SPECIFIC GRAVITY: 1.018 (ref 1.003–1.030)
Squamous Epithelial: 5
WBC UR: 4 /HPF (ref 0–5)

## 2013-09-18 LAB — CBC
HCT: 41.3 % (ref 35.0–47.0)
HGB: 13.7 g/dL (ref 12.0–16.0)
MCH: 29.5 pg (ref 26.0–34.0)
MCHC: 33.2 g/dL (ref 32.0–36.0)
MCV: 89 fL (ref 80–100)
PLATELETS: 234 10*3/uL (ref 150–440)
RBC: 4.63 10*6/uL (ref 3.80–5.20)
RDW: 12.9 % (ref 11.5–14.5)
WBC: 9.4 10*3/uL (ref 3.6–11.0)

## 2013-09-22 ENCOUNTER — Emergency Department: Payer: Self-pay | Admitting: Emergency Medicine

## 2013-09-22 LAB — URINALYSIS, COMPLETE
Bacteria: NONE SEEN
Bilirubin,UR: NEGATIVE
Glucose,UR: NEGATIVE mg/dL (ref 0–75)
KETONE: NEGATIVE
LEUKOCYTE ESTERASE: NEGATIVE
Nitrite: NEGATIVE
PH: 6 (ref 4.5–8.0)
PROTEIN: NEGATIVE
SPECIFIC GRAVITY: 1.017 (ref 1.003–1.030)
Squamous Epithelial: 3
WBC UR: NONE SEEN /HPF (ref 0–5)

## 2013-09-22 LAB — COMPREHENSIVE METABOLIC PANEL
ALK PHOS: 85 U/L
ALT: 63 U/L (ref 12–78)
ANION GAP: 5 — AB (ref 7–16)
AST: 25 U/L (ref 15–37)
Albumin: 3.7 g/dL (ref 3.4–5.0)
BUN: 9 mg/dL (ref 7–18)
Bilirubin,Total: 0.2 mg/dL (ref 0.2–1.0)
Calcium, Total: 9.3 mg/dL (ref 8.5–10.1)
Chloride: 104 mmol/L (ref 98–107)
Co2: 28 mmol/L (ref 21–32)
Creatinine: 0.8 mg/dL (ref 0.60–1.30)
EGFR (African American): >60
GLUCOSE: 86 mg/dL (ref 65–99)
Osmolality: 272 (ref 275–301)
POTASSIUM: 3.8 mmol/L (ref 3.5–5.1)
Sodium: 137 mmol/L (ref 136–145)
Total Protein: 7.9 g/dL (ref 6.4–8.2)

## 2013-09-22 LAB — CBC WITH DIFFERENTIAL/PLATELET
BASOS ABS: 0.1 10*3/uL (ref 0.0–0.1)
BASOS PCT: 0.8 %
EOS ABS: 0.1 10*3/uL (ref 0.0–0.7)
EOS PCT: 0.8 %
HCT: 42.3 % (ref 35.0–47.0)
HGB: 13.7 g/dL (ref 12.0–16.0)
Lymphocyte #: 3.9 10*3/uL — ABNORMAL HIGH (ref 1.0–3.6)
Lymphocyte %: 33.3 %
MCH: 29 pg (ref 26.0–34.0)
MCHC: 32.4 g/dL (ref 32.0–36.0)
MCV: 89 fL (ref 80–100)
MONO ABS: 0.6 x10 3/mm (ref 0.2–0.9)
MONOS PCT: 5.5 %
NEUTROS PCT: 59.6 %
Neutrophil #: 6.9 10*3/uL — ABNORMAL HIGH (ref 1.4–6.5)
Platelet: 248 10*3/uL (ref 150–440)
RBC: 4.73 10*6/uL (ref 3.80–5.20)
RDW: 13.3 % (ref 11.5–14.5)
WBC: 11.6 10*3/uL — ABNORMAL HIGH (ref 3.6–11.0)

## 2013-09-22 LAB — LIPASE, BLOOD: LIPASE: 175 U/L (ref 73–393)

## 2013-11-03 ENCOUNTER — Emergency Department: Payer: Self-pay | Admitting: Emergency Medicine

## 2013-11-03 LAB — CBC
HCT: 42.1 % (ref 35.0–47.0)
HGB: 13.8 g/dL (ref 12.0–16.0)
MCH: 29.2 pg (ref 26.0–34.0)
MCHC: 32.7 g/dL (ref 32.0–36.0)
MCV: 89 fL (ref 80–100)
Platelet: 261 10*3/uL (ref 150–440)
RBC: 4.72 10*6/uL (ref 3.80–5.20)
RDW: 12.7 % (ref 11.5–14.5)
WBC: 8.6 10*3/uL (ref 3.6–11.0)

## 2013-11-03 LAB — URINALYSIS, COMPLETE
BACTERIA: NONE SEEN
BILIRUBIN, UR: NEGATIVE
GLUCOSE, UR: NEGATIVE mg/dL (ref 0–75)
Ketone: NEGATIVE
Nitrite: NEGATIVE
PH: 6 (ref 4.5–8.0)
Protein: 30
RBC,UR: 560 /HPF (ref 0–5)
SPECIFIC GRAVITY: 1.027 (ref 1.003–1.030)

## 2013-11-03 LAB — COMPREHENSIVE METABOLIC PANEL
Albumin: 3.7 g/dL (ref 3.4–5.0)
Alkaline Phosphatase: 77 U/L
Anion Gap: 8 (ref 7–16)
BUN: 11 mg/dL (ref 7–18)
Bilirubin,Total: 0.2 mg/dL (ref 0.2–1.0)
CALCIUM: 9.2 mg/dL (ref 8.5–10.1)
Chloride: 107 mmol/L (ref 98–107)
Co2: 23 mmol/L (ref 21–32)
Creatinine: 0.62 mg/dL (ref 0.60–1.30)
EGFR (African American): >60
GLUCOSE: 105 mg/dL — AB (ref 65–99)
Osmolality: 275 (ref 275–301)
Potassium: 4 mmol/L (ref 3.5–5.1)
SGOT(AST): 20 U/L (ref 15–37)
SGPT (ALT): 38 U/L (ref 12–78)
Sodium: 138 mmol/L (ref 136–145)
TOTAL PROTEIN: 7.8 g/dL (ref 6.4–8.2)

## 2013-11-22 ENCOUNTER — Emergency Department (HOSPITAL_COMMUNITY): Payer: Medicaid Other

## 2013-11-22 ENCOUNTER — Emergency Department (HOSPITAL_COMMUNITY)
Admission: EM | Admit: 2013-11-22 | Discharge: 2013-11-22 | Disposition: A | Discharge status: Home / Self Care | Payer: Medicaid Other | Attending: Emergency Medicine | Admitting: Emergency Medicine

## 2013-11-22 ENCOUNTER — Encounter (HOSPITAL_COMMUNITY): Payer: Self-pay | Admitting: Emergency Medicine

## 2013-11-22 DIAGNOSIS — F17200 Nicotine dependence, unspecified, uncomplicated: Secondary | ICD-10-CM | POA: Insufficient documentation

## 2013-11-22 DIAGNOSIS — N390 Urinary tract infection, site not specified: Secondary | ICD-10-CM | POA: Insufficient documentation

## 2013-11-22 DIAGNOSIS — Z79899 Other long term (current) drug therapy: Secondary | ICD-10-CM | POA: Insufficient documentation

## 2013-11-22 DIAGNOSIS — F319 Bipolar disorder, unspecified: Secondary | ICD-10-CM | POA: Insufficient documentation

## 2013-11-22 DIAGNOSIS — Z792 Long term (current) use of antibiotics: Secondary | ICD-10-CM | POA: Insufficient documentation

## 2013-11-22 DIAGNOSIS — Z87442 Personal history of urinary calculi: Secondary | ICD-10-CM | POA: Insufficient documentation

## 2013-11-22 DIAGNOSIS — R000 Tachycardia, unspecified: Secondary | ICD-10-CM | POA: Insufficient documentation

## 2013-11-22 HISTORY — DX: Disorder of kidney and ureter, unspecified: N28.9

## 2013-11-22 LAB — URINALYSIS, ROUTINE W REFLEX MICROSCOPIC
Bilirubin Urine: NEGATIVE
Glucose, UA: NEGATIVE mg/dL
KETONES UR: 15 mg/dL — AB
NITRITE: POSITIVE — AB
PH: 6 (ref 5.0–8.0)
Protein, ur: NEGATIVE mg/dL
Specific Gravity, Urine: 1.027 (ref 1.005–1.030)
UROBILINOGEN UA: 1 mg/dL (ref 0.0–1.0)

## 2013-11-22 LAB — CBC WITH DIFFERENTIAL/PLATELET
BASOS PCT: 0 % (ref 0–1)
Basophils Absolute: 0 10*3/uL (ref 0.0–0.1)
Eosinophils Absolute: 0.1 10*3/uL (ref 0.0–0.7)
Eosinophils Relative: 1 % (ref 0–5)
HCT: 39.5 % (ref 36.0–46.0)
Hemoglobin: 13 g/dL (ref 12.0–15.0)
LYMPHS PCT: 28 % (ref 12–46)
Lymphs Abs: 2.7 10*3/uL (ref 0.7–4.0)
MCH: 29.7 pg (ref 26.0–34.0)
MCHC: 32.9 g/dL (ref 30.0–36.0)
MCV: 90.4 fL (ref 78.0–100.0)
Monocytes Absolute: 0.6 10*3/uL (ref 0.1–1.0)
Monocytes Relative: 6 % (ref 3–12)
Neutro Abs: 6.2 10*3/uL (ref 1.7–7.7)
Neutrophils Relative %: 65 % (ref 43–77)
PLATELETS: 264 10*3/uL (ref 150–400)
RBC: 4.37 MIL/uL (ref 3.87–5.11)
RDW: 12.8 % (ref 11.5–15.5)
WBC: 9.6 10*3/uL (ref 4.0–10.5)

## 2013-11-22 LAB — BASIC METABOLIC PANEL
BUN: 14 mg/dL (ref 6–23)
CALCIUM: 9.8 mg/dL (ref 8.4–10.5)
CO2: 23 mEq/L (ref 19–32)
Chloride: 104 mEq/L (ref 96–112)
Creatinine, Ser: 0.91 mg/dL (ref 0.50–1.10)
GFR calc Af Amer: >90 mL/min (ref >90)
GFR, EST NON AFRICAN AMERICAN: 80 mL/min — AB (ref >90)
Glucose, Bld: 93 mg/dL (ref 70–99)
Potassium: 3.9 mEq/L (ref 3.7–5.3)
Sodium: 140 mEq/L (ref 137–147)

## 2013-11-22 LAB — URINE MICROSCOPIC-ADD ON

## 2013-11-22 MED ORDER — ONDANSETRON HCL 4 MG/2ML IJ SOLN
4.0000 mg | Freq: Once | INTRAMUSCULAR | Status: AC
  Administered 2013-11-22: 4 mg via INTRAVENOUS
  Filled 2013-11-22: qty 2

## 2013-11-22 MED ORDER — HYDROCODONE-ACETAMINOPHEN 5-325 MG PO TABS: 1.0000 | ORAL_TABLET | Freq: Four times a day (QID) | ORAL | Status: DC | PRN

## 2013-11-22 MED ORDER — MORPHINE SULFATE 4 MG/ML IJ SOLN
4.0000 mg | Freq: Once | INTRAMUSCULAR | Status: AC
Start: 2013-11-22 — End: 2013-11-22
  Administered 2013-11-22: 4 mg via INTRAVENOUS
  Filled 2013-11-22: qty 1

## 2013-11-22 MED ORDER — CEPHALEXIN 500 MG PO CAPS: 500.0000 mg | ORAL_CAPSULE | Freq: Four times a day (QID) | ORAL | Status: DC

## 2013-11-22 MED ORDER — DEXTROSE 5 % IV SOLN
1.0000 g | Freq: Once | INTRAVENOUS | Status: AC
  Administered 2013-11-22: 1 g via INTRAVENOUS
  Filled 2013-11-22: qty 10

## 2013-11-22 NOTE — ED Provider Notes (Signed)
1600 - care from Dr. Wilkie Aye. 50F here with R flank pain. UTI on UA, hx of kidney stones - awaiting Renal US to look for hydro concerning for stones. 1700 - Renal US normal. Given pain meds, keflex. Patient stable for discharge.  1. UTI (lower urinary tract infection)      Dagmar Hait, MD 11/22/13 [phone]

## 2013-11-22 NOTE — ED Notes (Signed)
Patient transported to Ultrasound 

## 2013-11-22 NOTE — ED Notes (Signed)
Per pt she began having R flank pain and lower back pain on Friday.  Pt reports hx of kidney stones and also spinal stenosis.

## 2013-11-22 NOTE — Discharge Instructions (Signed)
Urinary Tract Infection  Urinary tract infections (UTIs) can develop anywhere along your urinary tract. Your urinary tract is your body's drainage system for removing wastes and extra water. Your urinary tract includes two kidneys, two ureters, a bladder, and a urethra. Your kidneys are a pair of bean-shaped organs. Each kidney is about the size of your fist. They are located below your ribs, one on each side of your spine.  CAUSES  Infections are caused by microbes, which are microscopic organisms, including fungi, viruses, and bacteria. These organisms are so small that they can only be seen through a microscope. Bacteria are the microbes that most commonly cause UTIs.  SYMPTOMS   Symptoms of UTIs may vary by age and gender of the patient and by the location of the infection. Symptoms in young women typically include a frequent and intense urge to urinate and a painful, burning feeling in the bladder or urethra during urination. Older women and men are more likely to be tired, shaky, and weak and have muscle aches and abdominal pain. A fever may mean the infection is in your kidneys. Other symptoms of a kidney infection include pain in your back or sides below the ribs, nausea, and vomiting.  DIAGNOSIS  To diagnose a UTI, your caregiver will ask you about your symptoms. Your caregiver also will ask to provide a urine sample. The urine sample will be tested for bacteria and white blood cells. White blood cells are made by your body to help fight infection.  TREATMENT   Typically, UTIs can be treated with medication. Because most UTIs are caused by a bacterial infection, they usually can be treated with the use of antibiotics. The choice of antibiotic and length of treatment depend on your symptoms and the type of bacteria causing your infection.  HOME CARE INSTRUCTIONS   If you were prescribed antibiotics, take them exactly as your caregiver instructs you. Finish the medication even if you feel better after you  have only taken some of the medication.   Drink enough water and fluids to keep your urine clear or pale yellow.   Avoid caffeine, tea, and carbonated beverages. They tend to irritate your bladder.   Empty your bladder often. Avoid holding urine for long periods of time.   Empty your bladder before and after sexual intercourse.   After a bowel movement, women should cleanse from front to back. Use each tissue only once.  SEEK MEDICAL CARE IF:    You have back pain.   You develop a fever.   Your symptoms do not begin to resolve within 3 days.  SEEK IMMEDIATE MEDICAL CARE IF:    You have severe back pain or lower abdominal pain.   You develop chills.   You have nausea or vomiting.   You have continued burning or discomfort with urination.  MAKE SURE YOU:    Understand these instructions.   Will watch your condition.   Will get help right away if you are not doing well or get worse.  Document Released: 03/21/2005 Document Revised: 12/11/2011 Document Reviewed: 07/20/2011  ExitCare Patient Information 2014 [employer].

## 2013-11-24 LAB — URINE CULTURE: Colony Count: >=100000

## 2013-11-26 ENCOUNTER — Telehealth (HOSPITAL_BASED_OUTPATIENT_CLINIC_OR_DEPARTMENT_OTHER): Payer: Self-pay | Admitting: Emergency Medicine

## 2013-11-26 NOTE — Telephone Encounter (Signed)
Post ED Visit - Positive Culture Follow-up  Culture report reviewed by antimicrobial stewardship pharmacist: [] Wes Dulaney, Pharm.D., BCPS [] Jeremy Frens, Pharm.D., BCPS [] Elizabeth Martin, Pharm.D., BCPS [x] Minh Pham, Pharm.D., BCPS, AAHIVP [] Michelle Turner, Pharm.D., BCPS, AAHIVP [] Nathan Cope, Pharm.D.  Positive urine culture Treated with Keflex, organism sensitive to the same and no further patient follow-up is required at this time.  Casey Hodges 11/26/2013, 1:40 PM   

## 2013-11-30 NOTE — ED Provider Notes (Signed)
CSN: [id number]     Arrival date & time 11/22/13  1406 History   First MD Initiated Contact with Patient 11/22/13 1426     Chief Complaint  Patient presents with  . Flank Pain  . Back Pain     (Consider location/radiation/quality/duration/timing/severity/associated sxs/prior Treatment) HPI Patient presents with R flank pain and back pain.  ONset of symptoms on Friday.  Waxing and waning but gradually worsening.  No radiation of pain.  No noted fevers.  Rates pain 10/10.  OTC pain medications not working.  Denies dysuria, hematuria.  Denies abdominal pain.  Denies weakness or numbness of the lower extremities or difficulty with bowel or bladder function.  Past Medical History  Diagnosis Date  . Bipolar affective     pt reported  . Uterine polyp   . Eating disorder     Under control per patient  . Renal disorder    Past Surgical History  Procedure Laterality Date  . Tubal ligation    . Laparoscopy abdomen diagnostic    . Hysteroscopy      removed polyps   No family history on file. History  Substance Use Topics  . Smoking status: Current Every Day Smoker -- 1.00 packs/day  . Smokeless tobacco: Never Used  . Alcohol Use: Yes     Comment: Occasional   OB History   Grav Para Term Preterm Abortions TAB SAB Ect Mult Living                 Review of Systems  Constitutional: Negative for fever.  Respiratory: Negative for chest tightness and shortness of breath.   Cardiovascular: Negative for chest pain.  Gastrointestinal: Negative for nausea, vomiting and abdominal pain.  Genitourinary: Positive for flank pain. Negative for dysuria and hematuria.  Musculoskeletal: Positive for back pain.  Neurological: Negative for headaches.  All other systems reviewed and are negative.     Allergies  Wellbutrin; Lasix; and Nitrofurantoin monohyd macro  Home Medications   Prior to Admission medications   Medication Sig Start Date End Date Taking? Authorizing Provider  ALPRAZolam  Prudy Feeler) 0.5 MG tablet Take 0.5 mg by mouth daily as needed for anxiety or sleep.   Yes Historical Provider, MD  cetirizine (ZYRTEC ALLERGY) 10 MG tablet Take 10 mg by mouth daily.   Yes Historical Provider, MD  ibuprofen (ADVIL,MOTRIN) 200 MG tablet Take 600 mg by mouth daily as needed for moderate pain (pain).    Yes Historical Provider, MD  Melatonin 5 MG TABS Take 10 mg by mouth at bedtime.   Yes Historical Provider, MD  ranitidine (ZANTAC) 150 MG tablet Take 150 mg by mouth 2 (two) times daily.   Yes Historical Provider, MD  sertraline (ZOLOFT) 100 MG tablet Take 100 mg by mouth daily.   Yes Historical Provider, MD  cephALEXin (KEFLEX) 500 MG capsule Take 1 capsule (500 mg total) by mouth 4 (four) times daily. 11/22/13   Dagmar Hait, MD  HYDROcodone-acetaminophen (NORCO/VICODIN) 5-325 MG per tablet Take 1 tablet by mouth every 6 (six) hours as needed for moderate pain. 11/22/13   Dagmar Hait, MD   BP 108/67  Pulse 91  Temp(Src) 97.3 F (36.3 C) (Oral)  Resp 16  SpO2 100%  LMP 10/29/2013 Physical Exam  Nursing note and vitals reviewed. Constitutional: She is oriented to person, place, and time. She appears well-developed and well-nourished.  Uncomfortable appearing  HENT:  Head: Normocephalic and atraumatic.  Cardiovascular: Regular rhythm and normal heart sounds.   No  murmur heard. tachycardia  Pulmonary/Chest: Effort normal. No respiratory distress.  Abdominal: Soft. Bowel sounds are normal. There is no tenderness.  No CVA tenderness  Musculoskeletal: She exhibits no edema.  Neurological: She is alert and oriented to person, place, and time.  Skin: Skin is warm and dry.  Psychiatric: She has a normal mood and affect.    ED Course  Procedures (including critical care time) Labs Review Labs Reviewed  URINALYSIS, ROUTINE W REFLEX MICROSCOPIC - Abnormal; Notable for the following:    Color, Urine AMBER (*)    APPearance CLOUDY (*)    Hgb urine dipstick LARGE  (*)    Ketones, ur 15 (*)    Nitrite POSITIVE (*)    Leukocytes, UA SMALL (*)    All other components within normal limits  BASIC METABOLIC PANEL - Abnormal; Notable for the following:    GFR calc non Af Amer 80 (*)    All other components within normal limits  URINE MICROSCOPIC-ADD ON - Abnormal; Notable for the following:    Squamous Epithelial / LPF FEW (*)    Bacteria, UA MANY (*)    All other components within normal limits  URINE CULTURE  CBC WITH DIFFERENTIAL    Imaging Review No results found.   EKG Interpretation None      MDM   Final diagnoses:  UTI (lower urinary tract infection)    Patient presents with right flank pain.  Hx of kidney stones.  Nontoxic on exam but uncomfortable.  Afebrile.  Nitrite + urine.  Patient given IV Rocephin.  Patient has had mulitple CT scans in the past for kidney stones.  Will obtain US to eval for infected kidney stone.  If neg, would treat as pyelonephritis with 1 week course of abx at home.  Signed out to Dr. Gwendolyn GrantWalden.    Shon Batonourtney F Latarsha Zani, MD 12/03/13 [phone]

## 2013-12-23 ENCOUNTER — Ambulatory Visit: Payer: Self-pay | Admitting: Obstetrics & Gynecology

## 2013-12-23 LAB — CBC
HCT: 39.8 % (ref 35.0–47.0)
HGB: 13.7 g/dL (ref 12.0–16.0)
MCH: 30.6 pg (ref 26.0–34.0)
MCHC: 34.3 g/dL (ref 32.0–36.0)
MCV: 89 fL (ref 80–100)
PLATELETS: 231 10*3/uL (ref 150–440)
RBC: 4.46 10*6/uL (ref 3.80–5.20)
RDW: 12.9 % (ref 11.5–14.5)
WBC: 7.5 10*3/uL (ref 3.6–11.0)

## 2013-12-29 ENCOUNTER — Ambulatory Visit: Payer: Self-pay | Admitting: Obstetrics & Gynecology

## 2013-12-30 LAB — HEMOGLOBIN: HGB: 10.8 g/dL — AB (ref 12.0–16.0)

## 2014-01-01 LAB — PATHOLOGY REPORT

## 2014-01-08 LAB — COMPREHENSIVE METABOLIC PANEL
ALBUMIN: 3.1 g/dL — AB (ref 3.4–5.0)
ALK PHOS: 96 U/L
ANION GAP: 4 — AB (ref 7–16)
AST: 63 U/L — AB (ref 15–37)
BILIRUBIN TOTAL: 0.3 mg/dL (ref 0.2–1.0)
BUN: 8 mg/dL (ref 7–18)
CALCIUM: 8.8 mg/dL (ref 8.5–10.1)
CREATININE: 0.71 mg/dL (ref 0.60–1.30)
Chloride: 110 mmol/L — ABNORMAL HIGH (ref 98–107)
Co2: 24 mmol/L (ref 21–32)
EGFR (Non-African Amer.): >60
Glucose: 104 mg/dL — ABNORMAL HIGH (ref 65–99)
Osmolality: 274 (ref 275–301)
Potassium: 4 mmol/L (ref 3.5–5.1)
SGPT (ALT): 80 U/L — ABNORMAL HIGH (ref 12–78)
Sodium: 138 mmol/L (ref 136–145)
TOTAL PROTEIN: 6.8 g/dL (ref 6.4–8.2)

## 2014-01-08 LAB — ACETAMINOPHEN LEVEL: Acetaminophen: <2 ug/mL

## 2014-01-08 LAB — CBC
HCT: 37.2 % (ref 35.0–47.0)
HGB: 12.6 g/dL (ref 12.0–16.0)
MCH: 30.5 pg (ref 26.0–34.0)
MCHC: 34 g/dL (ref 32.0–36.0)
MCV: 90 fL (ref 80–100)
PLATELETS: 324 10*3/uL (ref 150–440)
RBC: 4.13 10*6/uL (ref 3.80–5.20)
RDW: 12.8 % (ref 11.5–14.5)
WBC: 10.4 10*3/uL (ref 3.6–11.0)

## 2014-01-08 LAB — URINALYSIS, COMPLETE
Bacteria: NONE SEEN
Bilirubin,UR: NEGATIVE
Blood: NEGATIVE
GLUCOSE, UR: NEGATIVE mg/dL (ref 0–75)
Ketone: NEGATIVE
NITRITE: NEGATIVE
Ph: 6 (ref 4.5–8.0)
Protein: NEGATIVE
Specific Gravity: 1.015 (ref 1.003–1.030)
Squamous Epithelial: 8
WBC UR: 6 /HPF (ref 0–5)

## 2014-01-08 LAB — DRUG SCREEN, URINE
Amphetamines, Ur Screen: NEGATIVE (ref ?–1000)
Barbiturates, Ur Screen: NEGATIVE (ref ?–200)
Benzodiazepine, Ur Scrn: POSITIVE (ref ?–200)
CANNABINOID 50 NG, UR ~~LOC~~: POSITIVE (ref ?–50)
Cocaine Metabolite,Ur ~~LOC~~: NEGATIVE (ref ?–300)
MDMA (ECSTASY) UR SCREEN: NEGATIVE (ref ?–500)
Methadone, Ur Screen: NEGATIVE (ref ?–300)
Opiate, Ur Screen: NEGATIVE (ref ?–300)
Phencyclidine (PCP) Ur S: NEGATIVE (ref ?–25)
Tricyclic, Ur Screen: POSITIVE (ref ?–1000)

## 2014-01-08 LAB — ETHANOL
Ethanol %: <0.003 % (ref 0.000–0.080)
Ethanol: <3 mg/dL

## 2014-01-08 LAB — SALICYLATE LEVEL: SALICYLATES, SERUM: 2.1 mg/dL

## 2014-01-09 ENCOUNTER — Inpatient Hospital Stay: Payer: Self-pay | Admitting: Psychiatry

## 2014-01-12 LAB — SGOT (AST)(ARMC): SGOT(AST): 22 U/L (ref 15–37)

## 2014-01-12 LAB — ALT: ALT: 43 U/L (ref 12–78)

## 2014-04-21 ENCOUNTER — Ambulatory Visit: Payer: Self-pay | Admitting: Family Medicine

## 2014-05-11 ENCOUNTER — Ambulatory Visit: Payer: Self-pay | Admitting: Neurology

## 2014-05-23 ENCOUNTER — Emergency Department: Payer: Self-pay | Admitting: Emergency Medicine

## 2014-05-23 LAB — CBC
HCT: 39.2 % (ref 35.0–47.0)
HGB: 12.9 g/dL (ref 12.0–16.0)
MCH: 29 pg (ref 26.0–34.0)
MCHC: 32.9 g/dL (ref 32.0–36.0)
MCV: 88 fL (ref 80–100)
Platelet: 242 10*3/uL (ref 150–440)
RBC: 4.45 10*6/uL (ref 3.80–5.20)
RDW: 14 % (ref 11.5–14.5)
WBC: 9.4 10*3/uL (ref 3.6–11.0)

## 2014-05-23 LAB — COMPREHENSIVE METABOLIC PANEL
ALBUMIN: 3.3 g/dL — AB (ref 3.4–5.0)
ANION GAP: 7 (ref 7–16)
AST: 35 U/L (ref 15–37)
Alkaline Phosphatase: 92 U/L
BUN: 12 mg/dL (ref 7–18)
Bilirubin,Total: 0.2 mg/dL (ref 0.2–1.0)
CHLORIDE: 108 mmol/L — AB (ref 98–107)
CREATININE: 0.76 mg/dL (ref 0.60–1.30)
Calcium, Total: 8.7 mg/dL (ref 8.5–10.1)
Co2: 27 mmol/L (ref 21–32)
EGFR (African American): >60
EGFR (Non-African Amer.): >60
GLUCOSE: 93 mg/dL (ref 65–99)
Osmolality: 283 (ref 275–301)
Potassium: 3.6 mmol/L (ref 3.5–5.1)
SGPT (ALT): 45 U/L
Sodium: 142 mmol/L (ref 136–145)
TOTAL PROTEIN: 7.1 g/dL (ref 6.4–8.2)

## 2014-05-24 LAB — URINALYSIS, COMPLETE
BILIRUBIN, UR: NEGATIVE
Glucose,UR: NEGATIVE mg/dL (ref 0–75)
KETONE: NEGATIVE
Leukocyte Esterase: NEGATIVE
Nitrite: NEGATIVE
Ph: 6 (ref 4.5–8.0)
Protein: NEGATIVE
RBC,UR: 1083 /HPF (ref 0–5)
Specific Gravity: 1.023 (ref 1.003–1.030)
Squamous Epithelial: 2
WBC UR: 1 /HPF (ref 0–5)

## 2014-05-26 ENCOUNTER — Emergency Department (HOSPITAL_COMMUNITY)
Admission: EM | Admit: 2014-05-26 | Discharge: 2014-05-26 | Disposition: A | Discharge status: Home / Self Care | Payer: Medicaid Other | Attending: Emergency Medicine | Admitting: Emergency Medicine

## 2014-05-26 ENCOUNTER — Emergency Department (HOSPITAL_COMMUNITY): Payer: Medicaid Other

## 2014-05-26 ENCOUNTER — Encounter (HOSPITAL_COMMUNITY): Payer: Self-pay | Admitting: Family Medicine

## 2014-05-26 DIAGNOSIS — F319 Bipolar disorder, unspecified: Secondary | ICD-10-CM | POA: Diagnosis not present

## 2014-05-26 DIAGNOSIS — Z720 Tobacco use: Secondary | ICD-10-CM | POA: Diagnosis not present

## 2014-05-26 DIAGNOSIS — N201 Calculus of ureter: Secondary | ICD-10-CM | POA: Insufficient documentation

## 2014-05-26 DIAGNOSIS — Z91040 Latex allergy status: Secondary | ICD-10-CM | POA: Insufficient documentation

## 2014-05-26 DIAGNOSIS — Z79899 Other long term (current) drug therapy: Secondary | ICD-10-CM | POA: Insufficient documentation

## 2014-05-26 DIAGNOSIS — Z792 Long term (current) use of antibiotics: Secondary | ICD-10-CM | POA: Diagnosis not present

## 2014-05-26 DIAGNOSIS — R109 Unspecified abdominal pain: Secondary | ICD-10-CM | POA: Diagnosis present

## 2014-05-26 LAB — URINALYSIS, ROUTINE W REFLEX MICROSCOPIC
Bilirubin Urine: NEGATIVE
Glucose, UA: NEGATIVE mg/dL
KETONES UR: NEGATIVE mg/dL
Leukocytes, UA: NEGATIVE
NITRITE: NEGATIVE
PH: 5 (ref 5.0–8.0)
Protein, ur: NEGATIVE mg/dL
SPECIFIC GRAVITY, URINE: 1.021 (ref 1.005–1.030)
Urobilinogen, UA: 0.2 mg/dL (ref 0.0–1.0)

## 2014-05-26 LAB — CBC WITH DIFFERENTIAL/PLATELET
BASOS ABS: 0 10*3/uL (ref 0.0–0.1)
BASOS PCT: 0 % (ref 0–1)
EOS ABS: 0.1 10*3/uL (ref 0.0–0.7)
Eosinophils Relative: 2 % (ref 0–5)
HCT: 36.3 % (ref 36.0–46.0)
Hemoglobin: 11.9 g/dL — ABNORMAL LOW (ref 12.0–15.0)
Lymphocytes Relative: 49 % — ABNORMAL HIGH (ref 12–46)
Lymphs Abs: 3.8 10*3/uL (ref 0.7–4.0)
MCH: 28.5 pg (ref 26.0–34.0)
MCHC: 32.8 g/dL (ref 30.0–36.0)
MCV: 86.8 fL (ref 78.0–100.0)
Monocytes Absolute: 0.5 10*3/uL (ref 0.1–1.0)
Monocytes Relative: 6 % (ref 3–12)
NEUTROS ABS: 3.4 10*3/uL (ref 1.7–7.7)
NEUTROS PCT: 43 % (ref 43–77)
PLATELETS: 242 10*3/uL (ref 150–400)
RBC: 4.18 MIL/uL (ref 3.87–5.11)
RDW: 13.5 % (ref 11.5–15.5)
WBC: 7.9 10*3/uL (ref 4.0–10.5)

## 2014-05-26 LAB — BASIC METABOLIC PANEL
Anion gap: 11 (ref 5–15)
BUN: 8 mg/dL (ref 6–23)
CHLORIDE: 107 meq/L (ref 96–112)
CO2: 24 mEq/L (ref 19–32)
CREATININE: 0.88 mg/dL (ref 0.50–1.10)
Calcium: 9.2 mg/dL (ref 8.4–10.5)
GFR, EST NON AFRICAN AMERICAN: 83 mL/min — AB (ref >90)
Glucose, Bld: 87 mg/dL (ref 70–99)
Potassium: 4.2 mEq/L (ref 3.7–5.3)
Sodium: 142 mEq/L (ref 137–147)

## 2014-05-26 LAB — URINE MICROSCOPIC-ADD ON

## 2014-05-26 MED ORDER — OXYCODONE-ACETAMINOPHEN 5-325 MG PO TABS
1.0000 | ORAL_TABLET | ORAL | Status: DC | PRN
Start: 2014-05-26 — End: 2014-12-13

## 2014-05-26 MED ORDER — ONDANSETRON 4 MG PO TBDP: 4.0000 mg | ORAL_TABLET | Freq: Three times a day (TID) | ORAL | Status: DC | PRN

## 2014-05-26 MED ORDER — KETOROLAC TROMETHAMINE 30 MG/ML IJ SOLN: 30.0000 mg | Freq: Once | INTRAMUSCULAR | Status: DC

## 2014-05-26 MED ORDER — HYDROMORPHONE HCL 1 MG/ML IJ SOLN: 1.0000 mg | Freq: Once | INTRAMUSCULAR | Status: DC

## 2014-05-26 MED ORDER — FENTANYL CITRATE 0.05 MG/ML IJ SOLN
100.0000 ug | Freq: Once | INTRAMUSCULAR | Status: AC
  Administered 2014-05-26: 100 ug via INTRAVENOUS
  Filled 2014-05-26: qty 2

## 2014-05-26 MED ORDER — ONDANSETRON HCL 4 MG/2ML IJ SOLN
4.0000 mg | Freq: Once | INTRAMUSCULAR | Status: AC
  Administered 2014-05-26: 4 mg via INTRAVENOUS
  Filled 2014-05-26: qty 2

## 2014-05-26 NOTE — Discharge Instructions (Signed)
Please follow up with your primary care physician in 1-2 days. If you do not have one please call the [facility] number listed above. Please follow up with Dr. Borden to schedule a follow up appointment.  °Please take pain medication and/or muscle relaxants as prescribed and as needed for pain. Please do not drive on narcotic pain medication or on muscle relaxants. Please read all discharge instructions and return precautions.  ° °Kidney Stones °Kidney stones (urolithiasis) are deposits that form inside your kidneys. The intense pain is caused by the stone moving through the urinary tract. When the stone moves, the ureter goes into spasm around the stone. The stone is usually passed in the urine.  °CAUSES  °· A disorder that makes certain neck glands produce too much parathyroid hormone (primary hyperparathyroidism). °· A buildup of uric acid crystals, similar to gout in your joints. °· Narrowing (stricture) of the ureter. °· A kidney obstruction present at birth (congenital obstruction). °· Previous surgery on the kidney or ureters. °· Numerous kidney infections. °SYMPTOMS  °· Feeling sick to your stomach (nauseous). °· Throwing up (vomiting). °· Blood in the urine (hematuria). °· Pain that usually spreads (radiates) to the groin. °· Frequency or urgency of urination. °DIAGNOSIS  °· Taking a history and physical exam. °· Blood or urine tests. °· CT scan. °· Occasionally, an examination of the inside of the urinary bladder (cystoscopy) is performed. °TREATMENT  °· Observation. °· Increasing your fluid intake. °· Extracorporeal shock wave lithotripsy--This is a noninvasive procedure that uses shock waves to break up kidney stones. °· Surgery may be needed if you have severe pain or persistent obstruction. There are various surgical procedures. Most of the procedures are performed with the use of small instruments. Only small incisions are needed to accommodate these instruments, so recovery time  is minimized. °The size, location, and chemical composition are all important variables that will determine the proper choice of action for you. Talk to your health care provider to better understand your situation so that you will minimize the risk of injury to yourself and your kidney.  °HOME CARE INSTRUCTIONS  °· Drink enough water and fluids to keep your urine clear or pale yellow. This will help you to pass the stone or stone fragments. °· Strain all urine through the provided strainer. Keep all particulate matter and stones for your health care provider to see. The stone causing the pain may be as small as a grain of salt. It is very important to use the strainer each and every time you pass your urine. The collection of your stone will allow your health care provider to analyze it and verify that a stone has actually passed. The stone analysis will often identify what you can do to reduce the incidence of recurrences. °· Only take over-the-counter or prescription medicines for pain, discomfort, or fever as directed by your health care provider. °· Make a follow-up appointment with your health care provider as directed. °· Get follow-up X-rays if required. The absence of pain does not always mean that the stone has passed. It may have only stopped moving. If the urine remains completely obstructed, it can cause loss of kidney function or even complete destruction of the kidney. It is your responsibility to make sure X-rays and follow-ups are completed. Ultrasounds of the kidney can show blockages and the status of the kidney. Ultrasounds are not associated with any radiation and can be performed easily in a matter of minutes. °SEEK   MEDICAL CARE IF: °· You experience pain that is progressive and unresponsive to any pain medicine you have been prescribed. °SEEK IMMEDIATE MEDICAL CARE IF:  °· Pain cannot be controlled with the prescribed medicine. °· You have a fever or shaking chills. °· The severity or  intensity of pain increases over 18 hours and is not relieved by pain medicine. °· You develop a new onset of abdominal pain. °· You feel faint or pass out. °· You are unable to urinate. °MAKE SURE YOU:  °· Understand these instructions. °· Will watch your condition. °· Will get help right away if you are not doing well or get worse. °Document Released: 06/11/2005 Document Revised: 02/11/2013 Document Reviewed: 11/12/2012 °ExitCare® Patient Information ©2015 [employer]. This information is not intended to replace advice given to you by your health care provider. Make sure you discuss any questions you have with your health care provider. ° ° ° °

## 2014-05-26 NOTE — ED Provider Notes (Signed)
CSN: [id number]     Arrival date & time 05/26/14  1831 History   First MD Initiated Contact with Patient 05/26/14 1854     Chief Complaint  Patient presents with  . Flank Pain     (Consider location/radiation/quality/duration/timing/severity/associated sxs/prior Treatment) HPI Comments: Patient is a 38 yo F PMHx significant for recurrent kidney stones presenting to the ED for bilateral flank pain with radiation into lower abdomen with associated hematuria, dysuria, nausea, non-bloody non-bilious vomiting (x2 per day). Patient states this feels similar to previous kidney stones. Patient states [facility] for her kidney stones, states she has had a stent placed before.   Patient is a 38 y.o. female presenting with flank pain.  Flank Pain Associated symptoms include abdominal pain, nausea and vomiting.    Past Medical History  Diagnosis Date  . Bipolar affective     pt reported  . Uterine polyp   . Eating disorder     Under control per patient  . Renal disorder    Past Surgical History  Procedure Laterality Date  . Tubal ligation    . Laparoscopy abdomen diagnostic    . Hysteroscopy      removed polyps   No family history on file. History  Substance Use Topics  . Smoking status: Current Every Day Smoker -- 1.00 packs/day  . Smokeless tobacco: Never Used  . Alcohol Use: Yes     Comment: Occasional   OB History    No data available     Review of Systems  Gastrointestinal: Positive for nausea, vomiting and abdominal pain. Negative for diarrhea, blood in stool and anal bleeding.  Genitourinary: Positive for dysuria, hematuria and flank pain.  All other systems reviewed and are negative.     Allergies  Wellbutrin; Lasix; and Nitrofurantoin monohyd macro  Home Medications   Prior to Admission medications   Medication Sig Start Date End Date Taking? Authorizing Provider  ALPRAZolam Prudy Feeler(XANAX) 0.5 MG tablet Take 0.5 mg by mouth daily as needed  for anxiety or sleep.   Yes Historical Provider, MD  cetirizine (ZYRTEC ALLERGY) 10 MG tablet Take 10 mg by mouth daily.   Yes Historical Provider, MD  ibuprofen (ADVIL,MOTRIN) 200 MG tablet Take 600 mg by mouth daily as needed for moderate pain (pain).    Yes Historical Provider, MD  Melatonin 5 MG TABS Take 10 mg by mouth at bedtime.   Yes Historical Provider, MD  ranitidine (ZANTAC) 150 MG tablet Take 150 mg by mouth 2 (two) times daily.   Yes Historical Provider, MD  sertraline (ZOLOFT) 100 MG tablet Take 100 mg by mouth daily.   Yes Historical Provider, MD  cephALEXin (KEFLEX) 500 MG capsule Take 1 capsule (500 mg total) by mouth 4 (four) times daily. Patient not taking: Reported on 05/26/2014 11/22/13   Elwin MochaBlair Walden, MD  HYDROcodone-acetaminophen (NORCO/VICODIN) 5-325 MG per tablet Take 1 tablet by mouth every 6 (six) hours as needed for moderate pain. Patient not taking: Reported on 05/26/2014 11/22/13   Elwin MochaBlair Walden, MD  ondansetron (ZOFRAN ODT) 4 MG disintegrating tablet Take 1 tablet (4 mg total) by mouth every 8 (eight) hours as needed for nausea or vomiting. 05/26/14   Kristiann Noyce L Lucilia Yanni, PA-C  oxyCODONE-acetaminophen (PERCOCET/ROXICET) 5-325 MG per tablet Take 1 tablet by mouth every 4 (four) hours as needed for severe pain. May take 2 tablets PO q 6 hours for severe pain - Do not take with Tylenol as this tablet already  contains tylenol 05/26/14   Beatrice Ziehm L Jaide Hillenburg, PA-C   BP 113/57 mmHg  Pulse 88  Temp(Src) 97.9 F (36.6 C) (Oral)  Resp 20  SpO2 99%  LMP 10/29/2013 Physical Exam  Constitutional: She is oriented to person, place, and time. She appears well-developed and well-nourished. No distress.  HENT:  Head: Normocephalic and atraumatic.  Right Ear: External ear normal.  Left Ear: External ear normal.  Nose: Nose normal.  Mouth/Throat: Oropharynx is clear and moist.  Eyes: Conjunctivae are normal.  Neck: Neck supple.  Cardiovascular: Normal rate, regular rhythm and  normal heart sounds.   Pulmonary/Chest: Effort normal and breath sounds normal. No respiratory distress.  Abdominal: Soft. Bowel sounds are normal. She exhibits no distension. There is tenderness (mild suprapubic). There is no rebound and no guarding.  Musculoskeletal: Normal range of motion. She exhibits no edema.       Back:  Neurological: She is alert and oriented to person, place, and time.  Moves all extremities without ataxia.   Skin: Skin is warm and dry. She is not diaphoretic.  Nursing note and vitals reviewed.   ED Course  Procedures (including critical care time) Medications  fentaNYL (SUBLIMAZE) injection 100 mcg (100 mcg Intravenous Given 05/26/14 2014)  ondansetron (ZOFRAN) injection 4 mg (4 mg Intravenous Given 05/26/14 2014)  fentaNYL (SUBLIMAZE) injection 100 mcg (100 mcg Intravenous Given 05/26/14 2134)    Labs Review Labs Reviewed  URINALYSIS, ROUTINE W REFLEX MICROSCOPIC - Abnormal; Notable for the following:    APPearance CLOUDY (*)    Hgb urine dipstick LARGE (*)    All other components within normal limits  BASIC METABOLIC PANEL - Abnormal; Notable for the following:    GFR calc non Af Amer 83 (*)    All other components within normal limits  CBC WITH DIFFERENTIAL - Abnormal; Notable for the following:    Hemoglobin 11.9 (*)    Lymphocytes Relative 49 (*)    All other components within normal limits  URINE MICROSCOPIC-ADD ON - Abnormal; Notable for the following:    Squamous Epithelial / LPF MANY (*)    All other components within normal limits  URINE CULTURE    Imaging Review Ct Renal Stone Study  05/26/2014   CLINICAL DATA:  Bilateral flank pain.  Nausea and vomiting.  EXAM: CT ABDOMEN AND PELVIS WITHOUT CONTRAST  TECHNIQUE: Multidetector CT imaging of the abdomen and pelvis was performed following the standard protocol without IV contrast.  COMPARISON:  05/24/2014  FINDINGS: Tiny calculus in the lower pole of the left kidney. No hydronephrosis. No  ureteral calculus. Pelvic phleboliths are noted.  Liver, gallbladder, spleen, pancreas, adrenal glands are within normal limits.  Normal appendix.  Unremarkable bladder. Uterus is absent. Small right adnexal cystic lesion has a benign appearance.  No destructive bone lesion.  IMPRESSION: Left nephrolithiasis.  No evidence of urinary obstruction or ureteral calculus.   Electronically Signed   By: Maryclare Bean M.D.   On: 05/26/2014 21:24     EKG Interpretation None      Patient declined further pain medication after two doses of Fentanyl.   Patient able to tolerate PO intake in ED without difficulty.  MDM   Final diagnoses:  Flank pain  Left ureteral stone    Filed Vitals:   05/26/14 2130  BP: 113/57  Pulse: 88  Temp:   Resp:    Afebrile, NAD, non-toxic appearing, AAOx4.  Pt has been diagnosed with a Kidney Stone via CT. There is no evidence  of significant hydronephrosis, serum creatine WNL, vitals sign stable and the pt does not have irratractable vomiting. Pt will be dc home with pain medications & has been advised to follow up with PCP. Patient is stable at time of discharge      Jeannetta EllisJennifer L Latrelle Bazar, PA-C 05/26/14 2221  Layla MawKristen N Ward, DO 05/27/14 0110

## 2014-05-26 NOTE — ED Notes (Signed)
Pt here with bilateral flank pain and N,V. Hx pf same with kidney stones.

## 2014-05-26 NOTE — ED Notes (Signed)
Gave pt Sprite with PA Jen approval. Pt tolerating well.

## 2014-05-27 LAB — URINE CULTURE
COLONY COUNT: NO GROWTH
CULTURE: NO GROWTH

## 2014-05-28 ENCOUNTER — Emergency Department: Payer: Self-pay | Admitting: Student

## 2014-05-28 LAB — URINALYSIS, COMPLETE
BACTERIA: NONE SEEN
Bilirubin,UR: NEGATIVE
Glucose,UR: NEGATIVE mg/dL (ref 0–75)
Ketone: NEGATIVE
Leukocyte Esterase: NEGATIVE
Nitrite: NEGATIVE
Ph: 7 (ref 4.5–8.0)
Protein: 30
RBC,UR: 1025 /HPF (ref 0–5)
SPECIFIC GRAVITY: 1.017 (ref 1.003–1.030)
Squamous Epithelial: 4
WBC UR: 2 /HPF (ref 0–5)

## 2014-05-28 LAB — COMPREHENSIVE METABOLIC PANEL
ANION GAP: 5 — AB (ref 7–16)
AST: 31 U/L (ref 15–37)
Albumin: 2.7 g/dL — ABNORMAL LOW (ref 3.4–5.0)
Alkaline Phosphatase: 69 U/L
BUN: 10 mg/dL (ref 7–18)
Bilirubin,Total: 0.2 mg/dL (ref 0.2–1.0)
CHLORIDE: 109 mmol/L — AB (ref 98–107)
CO2: 27 mmol/L (ref 21–32)
CREATININE: 0.85 mg/dL (ref 0.60–1.30)
Calcium, Total: 7.7 mg/dL — ABNORMAL LOW (ref 8.5–10.1)
EGFR (African American): >60
EGFR (Non-African Amer.): >60
Glucose: 105 mg/dL — ABNORMAL HIGH (ref 65–99)
OSMOLALITY: 281 (ref 275–301)
POTASSIUM: 3.6 mmol/L (ref 3.5–5.1)
SGPT (ALT): 48 U/L
Sodium: 141 mmol/L (ref 136–145)
Total Protein: 5.9 g/dL — ABNORMAL LOW (ref 6.4–8.2)

## 2014-05-28 LAB — CBC
HCT: 40.7 % (ref 35.0–47.0)
HGB: 13.3 g/dL (ref 12.0–16.0)
MCH: 29.2 pg (ref 26.0–34.0)
MCHC: 32.8 g/dL (ref 32.0–36.0)
MCV: 89 fL (ref 80–100)
PLATELETS: 248 10*3/uL (ref 150–440)
RBC: 4.57 10*6/uL (ref 3.80–5.20)
RDW: 14.4 % (ref 11.5–14.5)
WBC: 7.3 10*3/uL (ref 3.6–11.0)

## 2014-05-28 LAB — PREGNANCY, URINE: Pregnancy Test, Urine: NEGATIVE m[IU]/mL

## 2014-05-30 ENCOUNTER — Emergency Department (HOSPITAL_COMMUNITY)
Admission: EM | Admit: 2014-05-30 | Discharge: 2014-05-30 | Disposition: A | Discharge status: Home / Self Care | Payer: Medicaid Other | Attending: Emergency Medicine | Admitting: Emergency Medicine

## 2014-05-30 ENCOUNTER — Encounter (HOSPITAL_COMMUNITY): Payer: Self-pay | Admitting: Emergency Medicine

## 2014-05-30 DIAGNOSIS — R110 Nausea: Secondary | ICD-10-CM | POA: Diagnosis not present

## 2014-05-30 DIAGNOSIS — R109 Unspecified abdominal pain: Secondary | ICD-10-CM | POA: Diagnosis present

## 2014-05-30 DIAGNOSIS — Z79899 Other long term (current) drug therapy: Secondary | ICD-10-CM | POA: Insufficient documentation

## 2014-05-30 DIAGNOSIS — M545 Low back pain: Secondary | ICD-10-CM | POA: Diagnosis not present

## 2014-05-30 DIAGNOSIS — Z3202 Encounter for pregnancy test, result negative: Secondary | ICD-10-CM | POA: Insufficient documentation

## 2014-05-30 DIAGNOSIS — Z8742 Personal history of other diseases of the female genital tract: Secondary | ICD-10-CM | POA: Insufficient documentation

## 2014-05-30 DIAGNOSIS — Z87448 Personal history of other diseases of urinary system: Secondary | ICD-10-CM | POA: Diagnosis not present

## 2014-05-30 DIAGNOSIS — R195 Other fecal abnormalities: Secondary | ICD-10-CM | POA: Insufficient documentation

## 2014-05-30 DIAGNOSIS — F319 Bipolar disorder, unspecified: Secondary | ICD-10-CM | POA: Diagnosis not present

## 2014-05-30 DIAGNOSIS — Z720 Tobacco use: Secondary | ICD-10-CM | POA: Diagnosis not present

## 2014-05-30 LAB — CBC WITH DIFFERENTIAL/PLATELET
BASOS ABS: 0 10*3/uL (ref 0.0–0.1)
BASOS PCT: 0 % (ref 0–1)
Eosinophils Absolute: 0.1 10*3/uL (ref 0.0–0.7)
Eosinophils Relative: 1 % (ref 0–5)
HCT: 37.5 % (ref 36.0–46.0)
Hemoglobin: 12.5 g/dL (ref 12.0–15.0)
Lymphocytes Relative: 27 % (ref 12–46)
Lymphs Abs: 1.9 10*3/uL (ref 0.7–4.0)
MCH: 28.2 pg (ref 26.0–34.0)
MCHC: 33.3 g/dL (ref 30.0–36.0)
MCV: 84.7 fL (ref 78.0–100.0)
Monocytes Absolute: 0.4 10*3/uL (ref 0.1–1.0)
Monocytes Relative: 6 % (ref 3–12)
Neutro Abs: 4.6 10*3/uL (ref 1.7–7.7)
Neutrophils Relative %: 66 % (ref 43–77)
Platelets: 268 10*3/uL (ref 150–400)
RBC: 4.43 MIL/uL (ref 3.87–5.11)
RDW: 13.2 % (ref 11.5–15.5)
WBC: 7 10*3/uL (ref 4.0–10.5)

## 2014-05-30 LAB — URINE MICROSCOPIC-ADD ON

## 2014-05-30 LAB — RAPID URINE DRUG SCREEN, HOSP PERFORMED
Amphetamines: NOT DETECTED
Barbiturates: NOT DETECTED
Benzodiazepines: NOT DETECTED
COCAINE: NOT DETECTED
Opiates: NOT DETECTED
Tetrahydrocannabinol: NOT DETECTED

## 2014-05-30 LAB — COMPREHENSIVE METABOLIC PANEL
ALBUMIN: 3.4 g/dL — AB (ref 3.5–5.2)
ALK PHOS: 63 U/L (ref 39–117)
ALT: 41 U/L — ABNORMAL HIGH (ref 0–35)
AST: 32 U/L (ref 0–37)
Anion gap: 14 (ref 5–15)
BUN: 7 mg/dL (ref 6–23)
CO2: 20 mEq/L (ref 19–32)
Calcium: 9.6 mg/dL (ref 8.4–10.5)
Chloride: 102 mEq/L (ref 96–112)
Creatinine, Ser: 0.77 mg/dL (ref 0.50–1.10)
GFR calc Af Amer: >90 mL/min (ref >90)
GFR calc non Af Amer: >90 mL/min (ref >90)
Glucose, Bld: 97 mg/dL (ref 70–99)
POTASSIUM: 4.1 meq/L (ref 3.7–5.3)
Sodium: 136 mEq/L — ABNORMAL LOW (ref 137–147)
Total Bilirubin: 0.2 mg/dL — ABNORMAL LOW (ref 0.3–1.2)
Total Protein: 7 g/dL (ref 6.0–8.3)

## 2014-05-30 LAB — URINALYSIS, ROUTINE W REFLEX MICROSCOPIC
BILIRUBIN URINE: NEGATIVE
Glucose, UA: NEGATIVE mg/dL
KETONES UR: 15 mg/dL — AB
LEUKOCYTES UA: NEGATIVE
Nitrite: NEGATIVE
PROTEIN: NEGATIVE mg/dL
Specific Gravity, Urine: 1.009 (ref 1.005–1.030)
Urobilinogen, UA: 0.2 mg/dL (ref 0.0–1.0)
pH: 5.5 (ref 5.0–8.0)

## 2014-05-30 LAB — LIPASE, BLOOD: LIPASE: 23 U/L (ref 11–59)

## 2014-05-30 LAB — PREGNANCY, URINE: Preg Test, Ur: NEGATIVE

## 2014-05-30 MED ORDER — ONDANSETRON HCL 4 MG/2ML IJ SOLN
4.0000 mg | Freq: Once | INTRAMUSCULAR | Status: AC
  Administered 2014-05-30: 4 mg via INTRAVENOUS
  Filled 2014-05-30: qty 2

## 2014-05-30 MED ORDER — FENTANYL CITRATE 0.05 MG/ML IJ SOLN
100.0000 ug | Freq: Once | INTRAMUSCULAR | Status: AC
  Administered 2014-05-30: 100 ug via INTRAVENOUS
  Filled 2014-05-30: qty 2

## 2014-05-30 MED ORDER — DICYCLOMINE HCL 20 MG PO TABS: 20.0000 mg | ORAL_TABLET | Freq: Three times a day (TID) | ORAL | Status: DC

## 2014-05-30 MED ORDER — SODIUM CHLORIDE 0.9 % IV BOLUS (SEPSIS)
1000.0000 mL | Freq: Once | INTRAVENOUS | Status: AC
  Administered 2014-05-30: 1000 mL via INTRAVENOUS

## 2014-05-30 NOTE — ED Provider Notes (Signed)
CSN: [id number]     Arrival date & time 05/30/14  1419 History   First MD Initiated Contact with Patient 05/30/14 1501     Chief Complaint  Patient presents with  . Flank Pain     (Consider location/radiation/quality/duration/timing/severity/associated sxs/prior Treatment) Patient is a 38 y.o. female presenting with flank pain. The history is provided by the patient.  Flank Pain   Casey Hodges is a 38 y.o. female who complains of bilateral back pain, right greater than left, for 1 week.  This is typical of prior episodes of pain that she usually has for 3 or 4 days, at least 4 times each here.  She was evaluated here 05/26/2014, and at that time had a CT of the abdomen which showed a tiny left renal calculus, without hydronephrosis, or suspected ureteral involvement.  She has had nausea today without vomiting.  She had some vomiting a couple of days ago.  She was prescribed Percocet on the prior visit.  [place], but decided to come here for evaluation.  She has not seen her urologist recently.  She denies weakness, dizziness, dysuria, cough, or chest pain.  She has noticed some white colored mucus on her stool, but does not think the stool itself has an abnormal color.  There are no other known modifying factors.   Past Medical History  Diagnosis Date  . Bipolar affective     pt reported  . Uterine polyp   . Eating disorder     Under control per patient  . Renal disorder    Past Surgical History  Procedure Laterality Date  . Tubal ligation    . Laparoscopy abdomen diagnostic    . Hysteroscopy      removed polyps   No family history on file. History  Substance Use Topics  . Smoking status: Current Every Day Smoker -- 1.00 packs/day  . Smokeless tobacco: Never Used  . Alcohol Use: Yes     Comment: Occasional   OB History    No data available     Review of Systems  Genitourinary: Positive for flank pain.  All other systems reviewed and are  negative.     Allergies  Wellbutrin; Hydrocodone; Lasix; and Nitrofurantoin monohyd macro  Home Medications   Prior to Admission medications   Medication Sig Start Date End Date Taking? Authorizing Provider  ARIPiprazole (ABILIFY) 5 MG tablet Take 5 mg by mouth daily.   Yes Historical Provider, MD  Biotin w/ Vitamins C & E (HAIR SKIN & NAILS GUMMIES) 1250-7.5-7.5 MCG-MG-UNT CHEW Chew 1 each by mouth daily.   Yes Historical Provider, MD  ibuprofen (ADVIL,MOTRIN) 200 MG tablet Take 600 mg by mouth daily as needed for moderate pain (pain).    Yes Historical Provider, MD  Melatonin 5 MG TABS Take 20 mg by mouth at bedtime.    Yes Historical Provider, MD  Multiple Vitamins-Minerals (MULTIVITAMIN GUMMIES WOMENS) CHEW Chew 1 each by mouth daily.   Yes Historical Provider, MD  oxyCODONE-acetaminophen (PERCOCET/ROXICET) 5-325 MG per tablet Take 1 tablet by mouth every 4 (four) hours as needed for severe pain. May take 2 tablets PO q 6 hours for severe pain - Do not take with Tylenol as this tablet already contains tylenol 05/26/14  Yes Jennifer L Piepenbrink, PA-C  promethazine (PHENERGAN) 25 MG suppository Place 25 mg rectally every 6 (six) hours as needed for nausea or vomiting.   Yes Historical Provider, MD  promethazine (PHENERGAN) 25 MG tablet Take  25 mg by mouth every 6 (six) hours as needed for nausea or vomiting.   Yes Historical Provider, MD  ranitidine (ZANTAC) 150 MG tablet Take 150 mg by mouth 2 (two) times daily as needed for heartburn.    Yes Historical Provider, MD  cephALEXin (KEFLEX) 500 MG capsule Take 1 capsule (500 mg total) by mouth 4 (four) times daily. Patient not taking: Reported on 05/26/2014 11/22/13   Elwin Mocha, MD  dicyclomine (BENTYL) 20 MG tablet Take 1 tablet (20 mg total) by mouth 4 (four) times daily -  before meals and at bedtime. 05/30/14   Flint Melter, MD  HYDROcodone-acetaminophen (NORCO/VICODIN) 5-325 MG per tablet Take 1 tablet by mouth every 6 (six) hours as  needed for moderate pain. Patient not taking: Reported on 05/26/2014 11/22/13   Elwin Mocha, MD   BP 121/82 mmHg  Pulse 77  Temp(Src) 97.6 F (36.4 C) (Oral)  Resp 10  SpO2 94%  LMP 10/29/2013 Physical Exam  Constitutional: She is oriented to person, place, and time. She appears well-developed and well-nourished. She appears distressed (she appears uncomfortable).  HENT:  Head: Normocephalic and atraumatic.  Right Ear: External ear normal.  Left Ear: External ear normal.  Eyes: Conjunctivae and EOM are normal. Pupils are equal, round, and reactive to light.  Neck: Normal range of motion and phonation normal. Neck supple.  Cardiovascular: Normal rate, regular rhythm and normal heart sounds.   Pulmonary/Chest: Effort normal and breath sounds normal. She exhibits no bony tenderness.  Abdominal: Soft. There is no tenderness.  Genitourinary:  No costovertebral angle tenderness to percussion.  Mild lumbar tenderness to palpation, bilateral.  Musculoskeletal: Normal range of motion.  Neurological: She is alert and oriented to person, place, and time. No cranial nerve deficit or sensory deficit. She exhibits normal muscle tone. Coordination normal.  Skin: Skin is warm, dry and intact.  Psychiatric: She has a normal mood and affect. Her behavior is normal. Judgment and thought content normal.  Nursing note and vitals reviewed.   ED Course  Procedures (including critical care time)  Initial assessment is nonspecific recurrent, likely chronic pain in her back.  There does not appear to be an urgent indication for imaging with either ultrasound or CT.  The CT done on 12/2, showed a very small left renal stone, and normal-appearing gallbladder and pancreas.  This was done during the time that she was having pain.  Medications  sodium chloride 0.9 % bolus 1,000 mL (1,000 mLs Intravenous New Bag/Given 05/30/14 1519)  ondansetron (ZOFRAN) injection 4 mg (4 mg Intravenous Given 05/30/14 1520)   fentaNYL (SUBLIMAZE) injection 100 mcg (100 mcg Intravenous Given 05/30/14 1521)  fentaNYL (SUBLIMAZE) injection 100 mcg (100 mcg Intravenous Given 05/30/14 1621)    Patient Vitals for the past 24 hrs:  BP Temp Temp src Pulse Resp SpO2  05/30/14 1630 121/82 mmHg - - 77 10 94 %  05/30/14 1626 116/66 mmHg - - 80 12 97 %  05/30/14 1545 119/73 mmHg - - 79 14 96 %  05/30/14 1530 127/80 mmHg - - 78 10 92 %  05/30/14 1500 132/88 mmHg - - 85 13 98 %  05/30/14 1446 - - - 86 12 99 %  05/30/14 1445 130/90 mmHg - - - (!) 31 -  05/30/14 1423 133/96 mmHg 97.6 F (36.4 C) Oral 83 22 100 %   r   Review of [place].  Controlled substance reporting system data bank indicates; that she frequently receives narcotic analgesic prescriptions  from various medical providers.  She also receives  Lyrica, and Zolpidem.     Marland Kitchen.4:52 PM Reevaluation with update and discussion. After initial assessment and treatment, an updated evaluation reveals the patient looks and is more comfortable.  I explained to her the findings.  We discussed follow-up with gastroenterology for possible endoscopy, and treatment of symptoms with Bentyl. At that point the female individual with her inquired about, "something for pain". I explained that I thought the Bentyl was adequate treatment. Later the female with the patient told the nurse, "she will probably be back". Jr Milliron L    Labs Review Labs Reviewed  URINALYSIS, ROUTINE W REFLEX MICROSCOPIC - Abnormal; Notable for the following:    Hgb urine dipstick LARGE (*)    Ketones, ur 15 (*)    All other components within normal limits  COMPREHENSIVE METABOLIC PANEL - Abnormal; Notable for the following:    Sodium 136 (*)    Albumin 3.4 (*)    ALT 41 (*)    Total Bilirubin 0.2 (*)    All other components within normal limits  URINE MICROSCOPIC-ADD ON - Abnormal; Notable for the following:    Squamous Epithelial / LPF FEW (*)    All other components within normal limits  CBC  WITH DIFFERENTIAL  LIPASE, BLOOD  PREGNANCY, URINE  URINE RAPID DRUG SCREEN (HOSP PERFORMED)    Imaging Review No results found.   EKG Interpretation None      MDM   Final diagnoses:  Low back pain without sciatica, unspecified back pain laterality  Stool discoloration    Nonspecific recurrent back pain, with recent advanced imaging, and history of chronic and recurrent pain.  Patient likely needs a GI evaluation for causes of imaging negative; occult gastrointestinal disease. Symptomatic treatment emergency department with IV fluids, Zofran, and screening laboratory evaluations were sent.  Labs were normal.  Indicating no acute metabolic infectious or liver problems to account for stool abnormalities.  It is a possibility that the patient has irritable bowel syndrome.  There is no current evidence for obstructing kidney stones, urinary tract infection or impending vascular collapse.  Doubt cauda equina syndrome or lumbar myelopathy   Nursing Notes Reviewed/ Care Coordinated Applicable Imaging Reviewed Interpretation of Laboratory Data incorporated into ED treatment  The patient appears reasonably screened and/or stabilized for discharge and I doubt any other medical condition or other [facility] requiring further screening, evaluation, or treatment in the ED at this time prior to discharge.  Plan: Home Medications- Bentyl; Home Treatments- rest; return here if the recommended treatment, does not improve the symptoms; Recommended follow up- GI and PCP f/u  Flint MelterElliott L Hannahmarie Asberry, MD 05/30/14 [phone]

## 2014-05-30 NOTE — Discharge Instructions (Signed)
Back Pain, Adult Low back pain is very common. About 1 in 5 people have back pain.The cause of low back pain is rarely dangerous. The pain often gets better over time.About half of people with a sudden onset of back pain feel better in just 2 weeks. About 8 in 10 people feel better by 6 weeks.  CAUSES Some common causes of back pain include:  Strain of the muscles or ligaments supporting the spine.  Wear and tear (degeneration) of the spinal discs.  Arthritis.  Direct injury to the back. DIAGNOSIS Most of the time, the direct cause of low back pain is not known.However, back pain can be treated effectively even when the exact cause of the pain is unknown.Answering your caregiver's questions about your overall health and symptoms is one of the most accurate ways to make sure the cause of your pain is not dangerous. If your caregiver needs more information, he or she may order lab work or imaging tests (X-rays or MRIs).However, even if imaging tests show changes in your back, this usually does not require surgery. HOME CARE INSTRUCTIONS For many people, back pain returns.Since low back pain is rarely dangerous, it is often a condition that people can learn to manageon their own.   Remain active. It is stressful on the back to sit or stand in one place. Do not sit, drive, or stand in one place for more than 30 minutes at a time. Take short walks on level surfaces as soon as pain allows.Try to increase the length of time you walk each day.  Do not stay in bed.Resting more than 1 or 2 days can delay your recovery.  Do not avoid exercise or work.Your body is made to move.It is not dangerous to be active, even though your back may hurt.Your back will likely heal faster if you return to being active before your pain is gone.  Pay attention to your body when you bend and lift. Many people have less discomfortwhen lifting if they bend their knees, keep the load close to their bodies,and  avoid twisting. Often, the most comfortable positions are those that put less stress on your recovering back.  Find a comfortable position to sleep. Use a firm mattress and lie on your side with your knees slightly bent. If you lie on your back, put a pillow under your knees.  Only take over-the-counter or prescription medicines as directed by your caregiver. Over-the-counter medicines to reduce pain and inflammation are often the most helpful.Your caregiver may prescribe muscle relaxant drugs.These medicines help dull your pain so you can more quickly return to your normal activities and healthy exercise.  Put ice on the injured area.  Put ice in a plastic bag.  Place a towel between your skin and the bag.  Leave the ice on for 15-20 minutes, 03-04 times a day for the first 2 to 3 days. After that, ice and heat may be alternated to reduce pain and spasms.  Ask your caregiver about trying back exercises and gentle massage. This may be of some benefit.  Avoid feeling anxious or stressed.Stress increases muscle tension and can worsen back pain.It is important to recognize when you are anxious or stressed and learn ways to manage it.Exercise is a great option. SEEK MEDICAL CARE IF:  You have pain that is not relieved with rest or medicine.  You have pain that does not improve in 1 week.  You have new symptoms.  You are generally not feeling well. SEEK   IMMEDIATE MEDICAL CARE IF:   You have pain that radiates from your back into your legs.  You develop new bowel or bladder control problems.  You have unusual weakness or numbness in your arms or legs.  You develop nausea or vomiting.  You develop abdominal pain.  You feel faint. Document Released: 06/11/2005 Document Revised: 12/11/2011 Document Reviewed: 10/13/2013 ExitCare Patient Information 2015 [employer]. This information is not intended to replace advice given to you by your health care provider. Make sure you  discuss any questions you have with your health care provider.  

## 2014-05-30 NOTE — ED Notes (Signed)
Pt. Stated, I have kidney stones that started one week ago.  [name]/when [facility] my stool is completely white so I know that's not normal.  I was here on Tuesday and Wednesday for the same.  It seems like its worse. I have had a stent years ago.

## 2014-05-30 NOTE — ED Notes (Signed)
Pt's significant other stated "if dr doesn't give the pt something for pain, then they will come back" dr bednar [name]

## 2014-06-19 ENCOUNTER — Emergency Department: Payer: Self-pay | Admitting: Emergency Medicine

## 2014-08-06 ENCOUNTER — Ambulatory Visit: Payer: Self-pay | Admitting: Family Medicine

## 2014-09-01 ENCOUNTER — Emergency Department: Payer: Self-pay | Admitting: Emergency Medicine

## 2014-10-16 NOTE — Op Note (Signed)
PATIENT NAME:  Casey Hodges, Casey Hodges MR#:  [id number] DATE OF BIRTH:  11/29/1975  DATE OF PROCEDURE:  12/29/2013  PREOPERATIVE DIAGNOSIS: Menorrhagia and chronic pelvic pain.   POSTOPERATIVE DIAGNOSIS: Menorrhagia, chronic pelvic pain, and fibroid uterus.   PROCEDURE PERFORMED: Complete laparoscopic hysterectomy with bilateral salpingectomy.   SURGEON: Annamarie MajorPaul Kritika Stukes, M.D.   ASSISTANJanene Harvey: [place] ANESTHESIA: General.   ESTIMATED BLOOD LOSS: 75 mL.   COMPLICATIONS: None.   FINDINGS: Fibroid uterus, small cyst on the left ovary that is aspirated.   DISPOSITION: To recovery room stable.   TECHNIQUE: The patient is prepped and draped in the usual sterile fashion. After adequate anesthesia is obtained in the dorsal lithotomy position,  in the bladder, Foley catheter is inserted. A speculum is placed and the cervix is grasped with a tenaculum and the uterus is sounded to 9 cm. A V-Care device is placed on the cervix for manipulation purposes.   Attention is then turned to the abdomen where a Veress needle is inserted through infraumbilical incision after Marcaine is used to anesthetize the skin. Veress needle placement is confirmed using the hanging drop technique and the abdomen is then insufflated with CO2 gas. A 5 mm trocar is then inserted under visualization with the laparoscope with no injuries or bleeding noted. The patient is placed in Trendelenburg positioning, and above findings are visualized.   An 11 mm trocar is placed in the right lower quadrant and a 5 mm trocar is placed in the left lower quadrant lateral to the inferior epigastric blood vessels with no injuries or bleeding noted. The right round ligaments are carefully coagulated and cut using a thunder beat device that uses harmonic energy. The fallopian tubes are dissected out along the mesosalpinx away from the ovary and the ovary and its main blood supply is preserved. The uterine ovarian blood vessels and ligaments are carefully coagulated  and cut. Dissection is carried down to the level of the uterine arteries which are also carefully coagulated and cut. The V-Care device is visualized and tenting the cervicovaginal junction in a circumferential incision is made with the thunder beat to completely amputate the cervix with uterus and fallopian tubes attached and it is removed vaginally.   The vaginal cuff is closed with a Polysorb suture in an interrupted fashion using the Endo Stitch device in a vertical fashion. Five sutures are placed and vaginal examination reveals complete closure with no loss of pneumoperitoneum and no defects.   Pelvic cavity is irrigated with saline with aspiration of all fluid and hemostasis is assured throughout the pelvic cavity. The left ovarian cyst is carefully punctured using the thunder beat device and drained of any fluid. There is no bleeding at the site. Gas is expelled. Trocars are removed. The skin is closed with Dermabond.   Cystoscopy is performed with saline distention of the bladder. The Foley catheter is removed. Urine flow is seen to jet out each ureter on both the left and right sides. Cystoscope is removed and Foley catheter is in place for overnight use.   The patient is sent to the recovery room in good condition. All sponge, instrument, and needle counts are correct    ____________________________ R. Annamarie MajorPaul Vyla Pint, MD rph:ts D: 12/29/2013 11:13:52 ET T: 12/29/2013 11:24:50 ET JOB#: [id number]  cc: Dierdre Searles. Paul Temple Ewart, MD, <Dictator> Nadara MustardOBERT P Kimya Mccahill MD ELECTRONICALLY SIGNED 12/30/2013 8:18

## 2014-10-16 NOTE — H&P (Signed)
PATIENT NAME:  Casey Hodges, Casey Hodges MR#:  [id number] DATE OF BIRTH:  February 11, 1976  DATE OF ADMISSION:  01/09/2014  SEX: Female.  RACE: White.  INITIAL PSYCHIATRIC OR IDENTIFYING INFORMATION: The patient is a 39 year old white female who is not employed and last worked at [employer] and quit in 2012 after she was stressed out because her 1 year old daughter at that time decided to go live with her father. The patient is married for the first time for 3 or 4 years and lives with her husband who is an Art gallery manager and works. The patient was seen with her husband and with her mother. The patient reports that she had a hysterectomy recently 2 weeks ago and she was given pain medications and that was too much for her and coming out of it was hard and so she took  pain medication, which is Percocet, caused her to have itching for which she took Benadryl and in addition, her pain did not subside so she took Naprosyn and her husband got worried and brought here for help. She did not mean to hurt herself.  PAST PSYCHIATRIC HISTORY: This is her third inpatient hospital hold on psychiatry. Her second inpatient hospitalization on psychiatry was in March of 2010 when she had disagreement with her daughter who decided to live with her father. No history of suicide attempts, not being followed by psychiatrist. Per PCP, Dr. Sherryll Burger, stated that he is going to recommend her to a psychiatrist for followup and help.  FAMILY HISTORY OF MENTAL ILLNESS: None known for mental illness in the family. No known history of suicide in the family.  FAMILY HISTORY: Raised by parents. Father works at [employer] part time in the lab and is an EMT. Mother works as Diplomatic Services operational officer. She has a half brother close to family.  PERSONAL HISTORY: Born in [place]. Graduated from high school. No college.   WORK HISTORY: Longest job was working at [employer] and last worked in 2012.   MILITARY HISTORY: None.  MARRIAGE HISTORY: Is married once. Married to husband  for 4 years and gets along very well with him. Has 1 daughter from a relationship and she decided to live with her father and patient gets to see the daughter whenever the daughter wants to see her and she is going to be 38 years old.  ALCOHOL AND DRUGS: Denies drinking alcohol. Does admit smoking THC whenever she wants to have sex and cannot relax. Denies any other street or prescription drug abuse. She smokes cigarettes at the rate of a pack a day for many years.  MEDICAL HISTORY: No known history of high blood pressure, no diabetes mellitus. Status post hysterectomy 2 weeks ago. No major injuries. No history of  MVA and never being unconscious. Spinal stenosis and fibromyalgia and has chronic pain secondary to the same, and degenerative bone disease and kidney stones and von Willebrand disease.   ALLERGIES: ALLERGIC TO LASIX WHICH CAUSES HER ITCHING IN HER PUBIC HAIR.   Being followed by Dr. Sherryll Burger. Last appointment 2 days ago. Next appointment is to be made.   PHYSICAL EXAMINATION: VITALS SIGNS: Temperature is 98.4, pulse is 91 and regular, respirations 18 per minute and regular. Blood pressure is 120/84 mmHg. HEENT: Head is normocephalic, atraumatic. Eyes: PERRLA, fundi are benign. NECK: Supple without any organomegaly.  CHEST: Normal expansion, normal breath sounds heard.  HEART: Normal rate and rhythum.. ABDOMEN: Soft, no organomegaly. Bowel sounds heard. RECTAL: Deferred. PELVIC: Deferred. NEUROLOGIC: Gait is normal. Romberg is negative. Cranial nerves II  through XII are grossly intact. DTRs 2+ normal. Plantars are normal as well.    MENTAL STATUS EXAMINATION: The patient is dressed in scrubs. Alert and oriented to place, person, and time. Was teary-eyed and had crying spells the whole interview talking about her daughter who is going to be 38 years old and patient can see her only when she wants to see her.  Denies any suicidal or homicidal plans. No psychosis. Denies auditory or visual  hallucinations or paranoid thinking. Memory is intact. Cognition and general knowledge of information is fair. Could spell the word world forward and backward without any problems. She knew capital of  [place] and  capital of [place] and, name of the current president. Insight and judgement guarded. Impulse control poor.   IMPRESSION:  AXIS I: Major depressive disorder, recurrent.  THC abuse. Nicotine dependent.  AXIS II: Personality disorder with dependent traits.   AXIS III: History of , left knee problems, history of kidney stones due to infection, a past history of spinal stenosis, fibromyalgia, von Willebrand disease.  AXIS IV: Moderate, constant problem because her daughter is 38 years old, does not want to see her very often and gets depressed about the same.   AXIS V: Global assessment of functioning of 35.  PLAN: Patient [facility] for close observation evaluation and help.Marland Kitchen. She will be observed and then she will be started on individual group therapy with coping skills and dealing with stressors or life and dealing with daughter's issues . Family conference may be requested to  given milieu therapy and supportive counseling. She will be stabilized and appropriate followup appointments will be made in the community at the time of discharge and she will be referred to a psychiatrist and a counselor.   ____________________________ Jannet MantisSurya K. Guss [place], MD skc:lt D: 01/09/2014 20:21:41 ET T: 01/09/2014 21:11:20 ET JOB#: [id number]  cc: Monika SalkSurya K. Guss [place], MD, <Dictator> Beau FannySURYA K Rena Sweeden MD ELECTRONICALLY SIGNED 01/10/2014 18:20

## 2014-10-16 NOTE — Discharge Summary (Signed)
PATIENT NAME:  Casey CadetBUCKNER, Zaylee C MR#:  [id number] DATE OF BIRTH:  18-Mar-1976  DATE OF ADMISSION:  01/09/2014 DATE OF DISCHARGE:  01/12/2014  REASON FOR ADMISSION: "I had an adverse reaction to pain medication."  Patient was brought in by her family for taking too many medications.    DISCHARGE DIAGNOSES: AXIS I: 1.  Unspecified depressive disorder. 2.  Cannibis use disorder.  AXIS II:  Deferred. AXIS III:   1.  Status post hysterectomy. 2.  Fibromyalgia. 3.  Right flank pain of unknown origin. AXIS IV:  Moderate. AXIS V:  Global assessment of function of 50.   DISCHARGE MEDICATIONS: 1.  Sertraline 200 mg p.o. daily for anxiety.  2.  Vistaril 50 mg p.o. at bedtime as needed for insomnia. 3.  Colace 200 mg p.o. b.i.d. for constipation. 4.  Lyrica 75 mg p.o. b.i.d. for fibromyalgia.  MEDICATIONS DISCONTINUED AT DISCHARGE:  Xanax 1 mg p.o. b.i.d.  HOSPITAL COURSE:  The patient is a 39 year old married Caucasian female who was brought in by her family members on July 18 for having a bad reaction to pain medication.  Patient explained that 2 weeks ago she had a partial laparoscopic hysterectomy due to having pelvic pain.  The patient was given opiates for pain.  Her family got concerned because the patient mixed Percocet with Benadryl and Naproxen, and took a large amount of medications all at once because she could not control her pain.  The patient stated that this was not a suicidal attempt, but the family got concerned about this unusual behavior and decided to bring her for psychiatric evaluation.  [facility]  Soon after the patient arrived, she was requesting to be discharged stating that all of this happened just because she had a bad reaction to her pain medication.  The patient states that when she took the Percocet, we was very confused and she does not have much recollection of the events that took place during the time that she was taking it.  The  Percocet was discontinued at admission and the patient's pain was managed with nonsteroidal antiinflammatory drugs.    There were no behavioral problems during her hospital stay, and the patient did not require any seclusion or restraints.   The patient was compliant with treatments.  She was pleasant and cooperative.  The patient has been hospitalized in our unit once before, and even though the patient does offer some anxiety issues, she has not been following up with an outpatient psychiatrist.  She stated that she had requested her primary care provider to refer her to see a professional for mental health.   At admission, the patient was found to have positive toxicology screen for marijuana.  She explained that she does use this marijuana occasionally to address pain issues related with intercourse.    LABORATORY RESULTS:  The patient had an AST on admission of 63 and an ALT of 80.  These were rechecked on July 21, and their levels returned to normal.  AST was 22 and ALT was 43. Her urine toxicology screen, as I mentioned above, was positive for benzodiazepines, cannibis, and antidepressants.   Her hematology screen was within normal limits.    At the time of the discharge, there were no concerns about the patient's safety.  The patient did not voice suicidality and appeared to significantly improve.  Patient was in agreement with following as outpatient with a psychiatrist.   During this hospital, there was  no need to start antidepressant; however, the patient was advised to start psychotherapy to address some ongoing depressive symptoms.     ____________________________ Jimmy Footman, MD ahg:ts D: 01/12/2014 17:14:14 ET T: 01/12/2014 18:00:11 ET JOB#: [id number]  cc: Jimmy Footman, MD, <Dictator> Horton Chin MD ELECTRONICALLY SIGNED 01/13/2014 15:29

## 2014-10-21 ENCOUNTER — Emergency Department: Admit: 2014-10-21 | Disposition: A | Discharge status: Home / Self Care | Payer: Self-pay | Admitting: Student

## 2014-10-21 LAB — CBC
HCT: 38.6 % (ref 35.0–47.0)
HGB: 12.6 g/dL (ref 12.0–16.0)
MCH: 28.9 pg (ref 26.0–34.0)
MCHC: 32.7 g/dL (ref 32.0–36.0)
MCV: 88 fL (ref 80–100)
PLATELETS: 229 10*3/uL (ref 150–440)
RBC: 4.37 10*6/uL (ref 3.80–5.20)
RDW: 13.4 % (ref 11.5–14.5)
WBC: 8.6 10*3/uL (ref 3.6–11.0)

## 2014-10-22 LAB — COMPREHENSIVE METABOLIC PANEL
ALBUMIN: 3.4 g/dL — AB
AST: 28 U/L
Alkaline Phosphatase: 56 U/L
Anion Gap: 6 — ABNORMAL LOW (ref 7–16)
BUN: 8 mg/dL
Bilirubin,Total: 0.5 mg/dL
CALCIUM: 9 mg/dL
Chloride: 111 mmol/L
Co2: 23 mmol/L
Creatinine: 0.69 mg/dL
EGFR (African American): >60
Glucose: 90 mg/dL
Potassium: 3.4 mmol/L — ABNORMAL LOW
SGPT (ALT): 21 U/L
SODIUM: 140 mmol/L
Total Protein: 6.5 g/dL

## 2014-10-22 LAB — ACETAMINOPHEN LEVEL: Acetaminophen: <10 ug/mL

## 2014-10-22 LAB — DRUG SCREEN, URINE
AMPHETAMINES, UR SCREEN: NEGATIVE
BENZODIAZEPINE, UR SCRN: POSITIVE
Barbiturates, Ur Screen: NEGATIVE
Cannabinoid 50 Ng, Ur ~~LOC~~: POSITIVE
Cocaine Metabolite,Ur ~~LOC~~: NEGATIVE
MDMA (ECSTASY) UR SCREEN: NEGATIVE
METHADONE, UR SCREEN: NEGATIVE
Opiate, Ur Screen: NEGATIVE
Phencyclidine (PCP) Ur S: NEGATIVE
Tricyclic, Ur Screen: NEGATIVE

## 2014-10-22 LAB — SALICYLATE LEVEL

## 2014-10-22 LAB — ETHANOL: Ethanol: <5 mg/dL

## 2014-10-22 LAB — TSH: Thyroid Stimulating Horm: 0.774 u[IU]/mL

## 2014-10-24 NOTE — Consult Note (Addendum)
PATIENT NAME:  Casey Hodges, Casey Hodges MR#:  [id number] DATE OF BIRTH:  18-Jan-1976  DATE OF CONSULTATION:  10/22/2014  REFERRING PHYSICIAN:   CONSULTING PHYSICIAN:  Josefita Weissmann K. Adrean Heitz, MD  HISTORY OF PRESENT ILLNESS: The patient is a 39 year old white female not employed and is on disability for mental illness that is bipolar disorder type 2 depressed and fibromyalgia and anxiety. The patient is widowed for 6 weeks after she lost her husband who was 38 years old when he had a bleeding ulcer. The patient has a 38 year old daughter and they both live together. The patient reports that she is being followed on an outpatient basis and then she got Xanax from her primary care physician for her sleep. Last night she felt that she could not sleep and took 1 extra pill of Xanax and her 38 year old daughter got concerned and call for help.  PAST PSYCHIATRIC HISTORY: No history of inpatient hospitalization to psychiatry. Being followed on an outpatient basis. [facility] is following her, by Dr. Mervyn SkeetersA. Her last appointment was last week when she went for therapy. Next appointment is this week and is coming up in the next few days with her therapist for help. No history of suicide attempts.   ALCOHOL AND DRUGS: Does not drink alcohol. Denies street or prescription drug abuse. Does admit smoking nicotine cigarettes.  MENTAL STATUS: The patient is alert and oriented, calm, pleasant and cooperative. No agitation. Affect and mood are stable. Denies feeling depressed. Denies feeling hopeless or helpless. No psychosis. Does not appear to respond to internal stimuli. Cognition is intact. Denies suicidal thoughts or plan. Contracts for safety and is eager to go home so that she can be with her 38 year old daughter who is by herself at home. Insight and judgement are fair. Impulse and control are fair.   IMPRESSION: Bipolar disorder, type 2, depressed, with insomnia.  PLAN: Discontinue involuntary commitment. Discontinue suicide  precautions and discontinue one-on-one sitter as she does not need a Comptrollersitter. Discharge the patient home and she will keep her [facility]up [facility] in the next few days with a therapist and with her doctor. She has enough medications at home.   ____________________________ Jannet MantisSurya K. Guss [place], MD skc:sb D: 10/22/2014 13:44:46 ET T: 10/22/2014 14:34:24 ET JOB#: [id number]  cc: Monika SalkSurya K. Guss [place], MD, <Dictator> Beau FannySURYA K Zharia Conrow MD ELECTRONICALLY SIGNED 10/23/2014 18:55

## 2014-11-29 ENCOUNTER — Other Ambulatory Visit: Payer: Self-pay

## 2014-11-29 DIAGNOSIS — IMO0002 Reserved for concepts with insufficient information to code with codable children: Secondary | ICD-10-CM

## 2014-11-29 DIAGNOSIS — R102 Pelvic and perineal pain: Secondary | ICD-10-CM

## 2014-12-01 ENCOUNTER — Other Ambulatory Visit (INDEPENDENT_AMBULATORY_CARE_PROVIDER_SITE_OTHER): Payer: Medicaid Other

## 2014-12-01 ENCOUNTER — Other Ambulatory Visit: Payer: Self-pay | Admitting: Obstetrics and Gynecology

## 2014-12-01 ENCOUNTER — Ambulatory Visit: Payer: Medicaid Other

## 2014-12-01 DIAGNOSIS — R102 Pelvic and perineal pain: Secondary | ICD-10-CM

## 2014-12-05 NOTE — Progress Notes (Unsigned)
ULTRASOUND REPORT  Location: [facility] Date of Service: 12/01/2014   Indications:Pelvic Pain Findings:  The uterus is surgically absent s/p hysterectomy in 12/29/13.   Right Ovary measures 1.0 x 3.4 x 2.0 cm. It is normal in appearance. Left Ovary measures 0.5 x 2.7 x 1.9 cm. It is normal appearance. Survey of the adnexa demonstrates no adnexal masses. There is no free fluid in the cul de sac.  Impression: 1. Normal appearing pelvic ultrasound s/p hysterectomy.  Recommendations: 1.Clinical correlation with the patient's History and Physical Exam.  Elliott,Teresa, Rad Tech  Herold Harms, MD

## 2014-12-07 ENCOUNTER — Ambulatory Visit: Payer: Medicaid Other | Admitting: Obstetrics and Gynecology

## 2014-12-10 ENCOUNTER — Other Ambulatory Visit: Payer: Medicaid Other

## 2014-12-13 ENCOUNTER — Ambulatory Visit (INDEPENDENT_AMBULATORY_CARE_PROVIDER_SITE_OTHER): Payer: Medicaid Other | Admitting: Family Medicine

## 2014-12-13 ENCOUNTER — Encounter: Payer: Self-pay | Admitting: Family Medicine

## 2014-12-13 VITALS — BP 104/72 | HR 120 | Temp 98.8°F | Ht 62.0 in | Wt 144.4 lb

## 2014-12-13 DIAGNOSIS — L659 Nonscarring hair loss, unspecified: Secondary | ICD-10-CM | POA: Diagnosis not present

## 2014-12-13 DIAGNOSIS — M797 Fibromyalgia: Secondary | ICD-10-CM | POA: Diagnosis not present

## 2014-12-13 DIAGNOSIS — N200 Calculus of kidney: Secondary | ICD-10-CM | POA: Insufficient documentation

## 2014-12-13 DIAGNOSIS — F411 Generalized anxiety disorder: Secondary | ICD-10-CM | POA: Insufficient documentation

## 2014-12-13 DIAGNOSIS — R42 Dizziness and giddiness: Secondary | ICD-10-CM | POA: Diagnosis not present

## 2014-12-13 DIAGNOSIS — L729 Follicular cyst of the skin and subcutaneous tissue, unspecified: Secondary | ICD-10-CM

## 2014-12-13 DIAGNOSIS — F419 Anxiety disorder, unspecified: Secondary | ICD-10-CM | POA: Insufficient documentation

## 2014-12-13 MED ORDER — CLONAZEPAM 0.5 MG PO TABS: 0.5000 mg | ORAL_TABLET | Freq: Two times a day (BID) | ORAL | Status: DC | PRN

## 2014-12-13 MED ORDER — PREGABALIN 150 MG PO CAPS: 150.0000 mg | ORAL_CAPSULE | Freq: Two times a day (BID) | ORAL | Status: DC

## 2014-12-13 MED ORDER — OXYCODONE-ACETAMINOPHEN 7.5-325 MG PO TABS: 1.0000 | ORAL_TABLET | Freq: Three times a day (TID) | ORAL | Status: DC | PRN

## 2014-12-13 NOTE — Progress Notes (Signed)
Name: Casey Hodges   MRN: [id number]    DOB: 30-Apr-1976   Date:12/13/2014       Progress Note  Subjective  Chief Complaint  Chief Complaint  Patient presents with  . Follow-up    wants to discuss = dizzy spells, husband passed away in 22-Sep-2022, has alopecia on front of scalp.    Dizziness This is a new problem. The problem has been unchanged. Associated symptoms include fatigue. Pertinent negatives include no chills, fever, nausea, neck pain, vertigo, visual change (sometimes has blurry vision when not using reading glasses.) or vomiting. She has tried position changes for the symptoms. The treatment provided moderate (Pt. thinks that the dizziness may be from her stressors.) relief.  Anxiety Presents for follow-up visit. Symptoms include dizziness, excessive worry, insomnia, irritability, malaise, nervous/anxious behavior and shortness of breath. Patient reports no nausea. Symptoms occur most days. The severity of symptoms is moderate. The quality of sleep is poor.   Risk factors include family history. Past treatments include benzodiazephines. The treatment provided mild relief. Compliance with prior treatments has been good.  Hair Loss Experienced a patch of hair loss in her front side of head. Noticed it about a month after her husband passed away and has been under a lot of stress. No other pertinent skin changes. Fibromyalgia Pt. Has history of fibromyalgia, with generalized aches and pains, which happen daily. Affected regions include lower and upper back, both lateral thighs, both hips, both knees, and both ankles. She is on Lyrica 150 mg twice daily and Oxycodone-Acetaminophen 7.5-325 mg every 8 hours as needed. These are helping with her pain. No apparent side effects. Cyst Pt. Is here for follow up of a cyst in her chest wall area, first noticed it a week and a half ago. She squeezed it and a greenish-yellow colored fluid came out. Since then, the cyst has gotten smaller in size but  not resolved. She has used hot compresses with no relief.   Past Medical History  Diagnosis Date  . Bipolar affective     pt reported  . Uterine polyp   . Eating disorder     Under control per patient  . Renal disorder   . Painful menstrual periods   . Abnormal vaginal Pap smear     10+ years ago- no colpo repeat was normal  . Painful intercourse   . GERD (gastroesophageal reflux disease)   . VWD (acquired von Willebrand's disease)   . Anxiety   . Spinal stenosis   . Kidney stone   . Bipolar disorder   . AR (allergic rhinitis)   . Pelvic pain in female   . Fibromyalgia     Past Surgical History  Procedure Laterality Date  . Tubal ligation    . Laparoscopy abdomen diagnostic    . Hysteroscopy      removed polyps  . Laparoscopic vaginal hysterectomy  2015    at [facility]    Family History  Problem Relation Age of Onset  . Breast cancer Paternal Grandmother   . Diabetes Maternal Grandmother   . Hypertension Father   . Sleep apnea Mother   . Diabetes Maternal Aunt   . Diabetes Maternal Uncle     History   Social History  . Marital Status: Widowed    Spouse Name: N/A  . Number of Children: 1  . Years of Education: N/A   Occupational History  . Un-employed due to bipolar    Social History Main Topics  .  Smoking status: Current Every Day Smoker -- 1.00 packs/day  . Smokeless tobacco: Never Used  . Alcohol Use: Yes     Comment: Occasional  . Drug Use: No  . Sexual Activity: No   Other Topics Concern  . Not on file   Social History Narrative   Regular Exercise -  NO   Daily Caffeine Use:  1 cup coffee in am      1 child, one step child           Current outpatient prescriptions:  .  Biotin w/ Vitamins C & E (HAIR SKIN & NAILS GUMMIES) 1250-7.5-7.5 MCG-MG-UNT CHEW, Chew 1 each by mouth daily., Disp: , Rfl:  .  clonazePAM (KLONOPIN) 0.5 MG tablet, Take 0.5 mg by mouth 2 (two) times daily as needed for anxiety., Disp: , Rfl:  .  ibuprofen  (ADVIL,MOTRIN) 200 MG tablet, Take 600 mg by mouth daily as needed for moderate pain (pain). , Disp: , Rfl:  .  Melatonin 5 MG TABS, Take 20 mg by mouth at bedtime. , Disp: , Rfl:  .  oxyCODONE-acetaminophen (PERCOCET/ROXICET) 5-325 MG per tablet, Take 1 tablet by mouth every 4 (four) hours as needed for severe pain. May take 2 tablets PO q 6 hours for severe pain - Do not take with Tylenol as this tablet already contains tylenol, Disp: 20 tablet, Rfl: 0 .  pregabalin (LYRICA) 150 MG capsule, Take 150 mg by mouth 2 (two) times daily., Disp: , Rfl:  .  promethazine (PHENERGAN) 25 MG tablet, Take 25 mg by mouth every 6 (six) hours as needed for nausea or vomiting., Disp: , Rfl:  .  ranitidine (ZANTAC) 150 MG tablet, Take 150 mg by mouth 2 (two) times daily as needed for heartburn. , Disp: , Rfl:  .  ALPRAZolam (XANAX) 0.25 MG tablet, Take 0.25 mg by mouth at bedtime as needed for anxiety., Disp: , Rfl:  .  ARIPiprazole (ABILIFY) 5 MG tablet, Take 5 mg by mouth daily., Disp: , Rfl:  .  cephALEXin (KEFLEX) 500 MG capsule, Take 1 capsule (500 mg total) by mouth 4 (four) times daily. (Patient not taking: Reported on 05/26/2014), Disp: 28 capsule, Rfl: 0 .  dicyclomine (BENTYL) 20 MG tablet, Take 1 tablet (20 mg total) by mouth 4 (four) times daily -  before meals and at bedtime. (Patient not taking: Reported on 12/13/2014), Disp: 40 tablet, Rfl: 0 .  HYDROcodone-acetaminophen (NORCO/VICODIN) 5-325 MG per tablet, Take 1 tablet by mouth every 6 (six) hours as needed for moderate pain. (Patient not taking: Reported on 05/26/2014), Disp: 20 tablet, Rfl: 0 .  Multiple Vitamins-Minerals (MULTIVITAMIN GUMMIES WOMENS) CHEW, Chew 1 each by mouth daily., Disp: , Rfl:  .  promethazine (PHENERGAN) 25 MG suppository, Place 25 mg rectally every 6 (six) hours as needed for nausea or vomiting., Disp: , Rfl:   Allergies  Allergen Reactions  . Wellbutrin [Bupropion Hcl] Other (See Comments)    Mental breakdown? Suicidal   .  Hydrocodone Nausea And Vomiting  . Penicillins Other (See Comments)    Yeast infections  . Lasix [Furosemide] Rash  . Nitrofurantoin Monohyd Macro Rash     Review of Systems  Constitutional: Positive for weight loss, malaise/fatigue, irritability and fatigue. Negative for fever and chills.  Respiratory: Positive for shortness of breath.   Gastrointestinal: Negative for nausea and vomiting.  Musculoskeletal: Negative for neck pain.  Neurological: Positive for dizziness. Negative for vertigo.  Psychiatric/Behavioral: Positive for depression. The patient is nervous/anxious and has  insomnia.       Objective  Filed Vitals:   12/13/14 1046  BP: 102/70  Pulse: 120  Temp: 98.8 F (37.1 C)  TempSrc: Oral  Height: 5\' 2"  (1.575 m)  Weight: 144 lb 6.4 oz (65.499 kg)  SpO2: 99%    Physical Exam  Constitutional: She is oriented to person, place, and time and well-developed, well-nourished, and in no distress. Vital signs are normal.  HENT:  Head: Normocephalic and atraumatic.  Right Ear: External ear normal.  Left Ear: External ear normal.  Eyes: Conjunctivae are normal. Pupils are equal, round, and reactive to light.  Neck: Normal range of motion. Neck supple.  Cardiovascular: Normal rate and regular rhythm.   Pulmonary/Chest: Effort normal and breath sounds normal.  Abdominal: Soft. Bowel sounds are normal.  Musculoskeletal:       Cervical back: She exhibits tenderness and spasm.       Lumbar back: She exhibits decreased range of motion, tenderness, pain and spasm.       Back:  Neurological: She is alert and oriented to person, place, and time. She has normal sensation, normal strength and intact cranial nerves. She displays abnormal reflex.  Reflex Scores:      Brachioradialis reflexes are 2+ on the right side and 2+ on the left side.      Patellar reflexes are 2+ on the right side and 0 on the left side. Skin: Skin is warm and dry.     A patch of hair loss on the front  left side of the head. Small round cystic subcutaneous cystic structure on pt.'s chest wall. No surrounding erythema, no drainge visible.  Psychiatric: Memory, affect and judgment normal. Her mood appears anxious. She exhibits a depressed mood.  Nursing note and vitals reviewed.  Assessment & Plan 1. Fibromyalgia Symptoms of chronic generalized musculoskeletal pain are well controlled on daily opioid and Lyrica. Patient is aware of the potential interactions and side effects associated with opioids as well as the tolerance potential. She is compliant with controlled substances agreement. Patient will follow-up in one month - pregabalin (LYRICA) 150 MG capsule; Take 1 capsule (150 mg total) by mouth 2 (two) times daily.  Dispense: 60 capsule; Refill: 0 - oxyCODONE-acetaminophen (PERCOCET) 7.5-325 MG per tablet; Take 1 tablet by mouth every 8 (eight) hours as needed for severe pain.  Dispense: 90 tablet; Refill: 0  2. Patchy loss of hair Hair loss could be from the recent stressors in her life. We will order a TSH for evaluation of thyroid function. Patient will follow-up with dermatology. - TSH  3. Generalized anxiety disorder We will increase the Pam to 0.5 mg 2 times daily as needed for optimal control of symptoms of anxiety. She is aware of the potential interaction between opioids and benzodiazepine therapy and the potential adverse effects and tolerance potential of benzodiazepines. Patient is compliant with her controlled substances agreement and will follow-up in one month. - clonazePAM (KLONOPIN) 0.5 MG tablet; Take 1 tablet (0.5 mg total) by mouth 2 (two) times daily as needed for anxiety.  Dispense: 60 tablet; Refill: 0  4. Postural dizziness Physical exam an orthostatic vital signs are unremarkable. We will obtain laboratory evaluation and follow-up with patient. - CBC w/Diff - Comprehensive Metabolic Panel (CMET)  5. Subcutaneous cyst  residual cystic structure remaining after  drainage of pus. No sign of infection and the cyst is getting small in size. She is going to follow up with Dermatology this week.   Hazel Sams  [facility] 12/13/2014 11:06 AM

## 2014-12-14 LAB — COMPREHENSIVE METABOLIC PANEL
A/G RATIO: 1.8 (ref 1.1–2.5)
ALBUMIN: 4.9 g/dL (ref 3.5–5.5)
ALT: 12 IU/L (ref 0–32)
AST: 17 IU/L (ref 0–40)
Alkaline Phosphatase: 69 IU/L (ref 39–117)
BUN/Creatinine Ratio: 20 (ref 8–20)
BUN: 14 mg/dL (ref 6–20)
Bilirubin Total: 0.5 mg/dL (ref 0.0–1.2)
CO2: 21 mmol/L (ref 18–29)
CREATININE: 0.71 mg/dL (ref 0.57–1.00)
Calcium: 10.2 mg/dL (ref 8.7–10.2)
Chloride: 103 mmol/L (ref 97–108)
GFR calc non Af Amer: 108 mL/min/{1.73_m2} (ref >59)
GFR, EST AFRICAN AMERICAN: 125 mL/min/{1.73_m2} (ref >59)
Globulin, Total: 2.7 g/dL (ref 1.5–4.5)
Glucose: 92 mg/dL (ref 65–99)
Potassium: 4.6 mmol/L (ref 3.5–5.2)
SODIUM: 140 mmol/L (ref 134–144)
Total Protein: 7.6 g/dL (ref 6.0–8.5)

## 2014-12-14 LAB — CBC WITH DIFFERENTIAL/PLATELET
BASOS ABS: 0 10*3/uL (ref 0.0–0.2)
Basos: 0 %
EOS (ABSOLUTE): 0 10*3/uL (ref 0.0–0.4)
Eos: 1 %
HEMOGLOBIN: 14.5 g/dL (ref 11.1–15.9)
Hematocrit: 43.8 % (ref 34.0–46.6)
IMMATURE GRANS (ABS): 0 10*3/uL (ref 0.0–0.1)
IMMATURE GRANULOCYTES: 0 %
LYMPHS: 37 %
Lymphocytes Absolute: 2.1 10*3/uL (ref 0.7–3.1)
MCH: 29.2 pg (ref 26.6–33.0)
MCHC: 33.1 g/dL (ref 31.5–35.7)
MCV: 88 fL (ref 79–97)
MONOCYTES: 9 %
Monocytes Absolute: 0.5 10*3/uL (ref 0.1–0.9)
NEUTROS ABS: 3.1 10*3/uL (ref 1.4–7.0)
NEUTROS PCT: 53 %
Platelets: 282 10*3/uL (ref 150–379)
RBC: 4.96 x10E6/uL (ref 3.77–5.28)
RDW: 13.8 % (ref 12.3–15.4)
WBC: 5.8 10*3/uL (ref 3.4–10.8)

## 2014-12-14 LAB — TSH: TSH: 1.05 u[IU]/mL (ref 0.450–4.500)

## 2014-12-14 NOTE — Progress Notes (Signed)
Called pt, advised of normal labs.

## 2014-12-16 ENCOUNTER — Ambulatory Visit (INDEPENDENT_AMBULATORY_CARE_PROVIDER_SITE_OTHER): Payer: Medicaid Other | Admitting: Obstetrics and Gynecology

## 2014-12-16 ENCOUNTER — Encounter: Payer: Self-pay | Admitting: Obstetrics and Gynecology

## 2014-12-16 VITALS — BP 109/69 | HR 111 | Ht 62.0 in | Wt 148.1 lb

## 2014-12-16 DIAGNOSIS — M545 Low back pain: Secondary | ICD-10-CM | POA: Diagnosis not present

## 2014-12-16 DIAGNOSIS — R102 Pelvic and perineal pain: Secondary | ICD-10-CM | POA: Diagnosis not present

## 2014-12-16 DIAGNOSIS — N898 Other specified noninflammatory disorders of vagina: Secondary | ICD-10-CM

## 2014-12-16 MED ORDER — METRONIDAZOLE 0.75 % VA GEL: 1.0000 | Freq: Every day | VAGINAL | Status: DC

## 2014-12-16 NOTE — Progress Notes (Signed)
Patient ID: Casey Hodges, female   DOB: Feb 10, 1976, 39 y.o.   MRN: [id number]   U/s results. C/o of slight vaginal d/c with odor.   GYN ENCOUNTER NOTE  Subjective:       Casey Hodges is a 39 y.o. G3P1001 female is here for gynecologic evaluation of the following issues:  1. Ultrasound results for pelvic pain 2. Vaginal discharge with odor.  Review of pelvic ultrasound from 12/01/2014 reveals a normal pelvis, status post hysterectomy.  The patient is still having pelvic pain and has vaginal cuff tenderness; she desires to proceed with laparoscopy to assess for etiology to her pelvic pain-rule out endometriosis versus pelvic adhesive disease.  Patient also is complaining of a malodorous discharge.Wet prep is done today.-Consistent with bacterial vaginosis.   Gynecologic History Patient's last menstrual period was 10/29/2013. Contraception: status post hysterectomy  Obstetric History OB History  Gravida Para Term Preterm AB SAB TAB Ectopic Multiple Living  1 1 1       1     # Outcome Date GA Lbr Len/2nd Weight Sex Delivery Anes PTL Lv  1 Term 1999   7 lb 9 oz (3.43 kg) F Vag-Spont   Y      Past Medical History  Diagnosis Date  . Bipolar affective     pt reported  . Uterine polyp   . Eating disorder     Under control per patient  . Renal disorder   . Painful menstrual periods   . Abnormal vaginal Pap smear     10+ years ago- no colpo repeat was normal  . Painful intercourse   . GERD (gastroesophageal reflux disease)   . VWD (acquired von Willebrand's disease)   . Anxiety   . Spinal stenosis   . Kidney stone   . Bipolar disorder   . AR (allergic rhinitis)   . Pelvic pain in female   . Fibromyalgia     Past Surgical History  Procedure Laterality Date  . Tubal ligation    . Laparoscopy abdomen diagnostic    . Hysteroscopy      removed polyps  . Laparoscopic vaginal hysterectomy  2015    at [facility]    Current Outpatient Prescriptions on File Prior to Visit   Medication Sig Dispense Refill  . Biotin w/ Vitamins C & E (HAIR SKIN & NAILS GUMMIES) 1250-7.5-7.5 MCG-MG-UNT CHEW Chew 1 each by mouth daily.    . clonazePAM (KLONOPIN) 0.5 MG tablet Take 1 tablet (0.5 mg total) by mouth 2 (two) times daily as needed for anxiety. 60 tablet 0  . ibuprofen (ADVIL,MOTRIN) 200 MG tablet Take 600 mg by mouth daily as needed for moderate pain (pain).     . Melatonin 5 MG TABS Take 20 mg by mouth at bedtime.     . Multiple Vitamins-Minerals (MULTIVITAMIN GUMMIES WOMENS) CHEW Chew 1 each by mouth daily.    Marland Kitchen oxyCODONE-acetaminophen (PERCOCET) 7.5-325 MG per tablet Take 1 tablet by mouth every 8 (eight) hours as needed for severe pain. 90 tablet 0  . pregabalin (LYRICA) 150 MG capsule Take 1 capsule (150 mg total) by mouth 2 (two) times daily. 60 capsule 0  . promethazine (PHENERGAN) 25 MG suppository Place 25 mg rectally every 6 (six) hours as needed for nausea or vomiting.    . ranitidine (ZANTAC) 150 MG tablet Take 150 mg by mouth 2 (two) times daily as needed for heartburn.      No current facility-administered medications on file prior to visit.  Allergies  Allergen Reactions  . Wellbutrin [Bupropion Hcl] Other (See Comments)    Mental breakdown? Suicidal   . Penicillins Other (See Comments)    Yeast infections  . Lasix [Furosemide] Rash  . Nitrofurantoin Monohyd Macro Rash    History   Social History  . Marital Status: Widowed    Spouse Name: N/A  . Number of Children: 1  . Years of Education: N/A   Occupational History  . Un-employed due to bipolar    Social History Main Topics  . Smoking status: Current Every Day Smoker -- 1.00 packs/day  . Smokeless tobacco: Never Used  . Alcohol Use: Yes     Comment: Occasional  . Drug Use: No  . Sexual Activity: No   Other Topics Concern  . Not on file   Social History Narrative   Regular Exercise -  NO   Daily Caffeine Use:  1 cup coffee in am      1 child, one step child           Family History  Problem Relation Age of Onset  . Breast cancer Paternal Grandmother   . Diabetes Maternal Grandmother   . Hypertension Father   . Sleep apnea Mother   . Diabetes Maternal Aunt   . Diabetes Maternal Uncle     The following portions of the patient's history were reviewed and updated as appropriate: allergies, current medications, past family history, past medical history, past social history, past surgical history and problem list.  Review of Systems Review of Systems - General ROS: negative for - chills, fatigue, fever, hot flashes, malaise or night sweats Hematological and Lymphatic ROS: negative for - bleeding problems or swollen lymph nodes Gastrointestinal ROS: negative for - blood in stools, change in bowel habits and naunal pain,sea/vomiting.POSITIVE-abdominal/pelvic pain Musculoskeletal ROS: negative for - joint pain, muscle pain or muscular weakness Genito-Urinary ROS:  incontinence; negative for -  dysuria, genital ulcers, hematuria, nocturia. POSITIVE-Pelvic pain, vaginal discharge, dyspareunia,  Objective:   BP 109/69 mmHg  Pulse 111  Ht  (1.575 m)  Wt 148 lb 1.6 oz (67.178 kg)  BMI 27.08 kg/m2  LMP 10/29/2013  CONSTITUTIONAL: Well-developed, well-nourished female in no acute distress.  HENT:  Normocephalic, atraumatic.  NECK: Normal range of motion, supple, no masses.  Normal thyroid.  SKIN: Skin is warm and dry. No rash noted. Not diaphoretic. No erythema. No pallor. NEUROLGIC: Alert and oriented to person, place, and time. PSYCHIATRIC: Normal mood and affect. Normal behavior. Normal judgment and thought content. CARDIOVASCULAR:Not Examined RESPIRATORY: Not Examined BREASTS: Not Examined ABDOMEN: Soft, non distended; Non tender.  No Organomegaly. PELVIC:  External Genitalia: Normal  BUS: Normal  Vagina: White discharge, vaginal cuff thickening, mild tenderness  Cervix: Surgically absent  Uterus: Surgically absent  Adnexa: Normal  RV:  Normal   Bladder: Nontender  PROCEDURE NOTE: Wet prep  NS - clue cells present, no WBCs, no trich KOH - No yeast or hyphae   Assessment:   1. Pelvic pain in female   2. Vaginal discharge   3. Low back pain without sciatica, unspecified back pain laterality      Plan:   1. Schedule Laparoscopy w/ biopsy - possible endometriosis Versus pelvic adhesive disease  2. Bacterial vaginosis on wet prep.-Metrogel  A total of 25 minutes were spent face-to-face with the patient during this encounter and over half of that time involved counseling and coordination of care.

## 2014-12-16 NOTE — Patient Instructions (Signed)
1. Use Metrogel for Bacterial Vaginosis  2. Laparoscopy - schedule pre-op appointment and procedure appointment

## 2014-12-19 ENCOUNTER — Encounter: Payer: Self-pay | Admitting: Obstetrics and Gynecology

## 2014-12-19 DIAGNOSIS — M1990 Unspecified osteoarthritis, unspecified site: Secondary | ICD-10-CM | POA: Insufficient documentation

## 2014-12-30 ENCOUNTER — Encounter: Payer: Self-pay | Admitting: *Deleted

## 2014-12-30 ENCOUNTER — Emergency Department: Payer: Medicaid Other

## 2014-12-30 ENCOUNTER — Emergency Department
Admission: EM | Admit: 2014-12-30 | Discharge: 2014-12-30 | Disposition: A | Discharge status: Home / Self Care | Payer: Medicaid Other | Attending: Student | Admitting: Student

## 2014-12-30 DIAGNOSIS — Z79899 Other long term (current) drug therapy: Secondary | ICD-10-CM | POA: Diagnosis not present

## 2014-12-30 DIAGNOSIS — Z7952 Long term (current) use of systemic steroids: Secondary | ICD-10-CM | POA: Diagnosis not present

## 2014-12-30 DIAGNOSIS — R05 Cough: Secondary | ICD-10-CM | POA: Diagnosis present

## 2014-12-30 DIAGNOSIS — Z880 Allergy status to penicillin: Secondary | ICD-10-CM | POA: Insufficient documentation

## 2014-12-30 DIAGNOSIS — Z792 Long term (current) use of antibiotics: Secondary | ICD-10-CM | POA: Diagnosis not present

## 2014-12-30 DIAGNOSIS — J209 Acute bronchitis, unspecified: Secondary | ICD-10-CM | POA: Diagnosis not present

## 2014-12-30 LAB — CBC WITH DIFFERENTIAL/PLATELET
BASOS PCT: 1 %
Basophils Absolute: 0 10*3/uL (ref 0–0.1)
Eosinophils Absolute: 0.1 10*3/uL (ref 0–0.7)
Eosinophils Relative: 1 %
HEMATOCRIT: 41.3 % (ref 35.0–47.0)
Hemoglobin: 13.6 g/dL (ref 12.0–16.0)
LYMPHS ABS: 3 10*3/uL (ref 1.0–3.6)
LYMPHS PCT: 45 %
MCH: 29.3 pg (ref 26.0–34.0)
MCHC: 32.8 g/dL (ref 32.0–36.0)
MCV: 89.4 fL (ref 80.0–100.0)
MONOS PCT: 7 %
Monocytes Absolute: 0.5 10*3/uL (ref 0.2–0.9)
Neutro Abs: 3.1 10*3/uL (ref 1.4–6.5)
Neutrophils Relative %: 46 %
Platelets: 215 10*3/uL (ref 150–440)
RBC: 4.62 MIL/uL (ref 3.80–5.20)
RDW: 13.5 % (ref 11.5–14.5)
WBC: 6.6 10*3/uL (ref 3.6–11.0)

## 2014-12-30 MED ORDER — GUAIFENESIN-CODEINE 100-10 MG/5ML PO SOLN: 10.0000 mL | ORAL | Status: DC | PRN

## 2014-12-30 MED ORDER — PREDNISONE 10 MG PO TABS: 50.0000 mg | ORAL_TABLET | Freq: Every day | ORAL | Status: DC

## 2014-12-30 MED ORDER — FLUCONAZOLE 150 MG PO TABS: 150.0000 mg | ORAL_TABLET | Freq: Every day | ORAL | Status: DC

## 2014-12-30 MED ORDER — LEVOFLOXACIN 500 MG PO TABS: 500.0000 mg | ORAL_TABLET | Freq: Every day | ORAL | Status: DC

## 2014-12-30 NOTE — Discharge Instructions (Signed)

## 2014-12-30 NOTE — ED Provider Notes (Signed)
[facility] Emergency Department Provider Note   Time seen: Approximately 5:45 PM  I have reviewed the triage vital signs and the nursing notes.   HISTORY  Chief Complaint Cough   HPI Casey Hodges is a 39 y.o. female presents with a one-week to 10 day history of cough and congestion and body aches. States that [facility] and given a [facility]-Pak but still complaining of cough. She reports fever subjectively at home and will see her primary care doctor.   Past Medical History  Diagnosis Date  . Bipolar affective     pt reported  . Uterine polyp   . Eating disorder     Under control per patient  . Renal disorder   . Painful menstrual periods   . Abnormal vaginal Pap smear     10+ years ago- no colpo repeat was normal  . Painful intercourse   . GERD (gastroesophageal reflux disease)   . VWD (acquired von Willebrand's disease)   . Anxiety   . Spinal stenosis   . Kidney stone   . Bipolar disorder   . AR (allergic rhinitis)   . Pelvic pain in female   . Fibromyalgia     Patient Active Problem List   Diagnosis Date Noted  . Arthritis 12/19/2014  . Calculus of kidney 12/13/2014  . Patchy loss of hair 12/13/2014  . Anxiety disorder 12/13/2014  . Postural dizziness 12/13/2014  . Subcutaneous cyst 12/13/2014  . Nephrolithiasis 05/23/2012  . Chronic pelvic pain in female 05/23/2012  . Fibromyalgia 05/23/2012  . Bipolar disorder 06/05/2011    Past Surgical History  Procedure Laterality Date  . Tubal ligation    . Laparoscopy abdomen diagnostic    . Hysteroscopy      removed polyps  . [facility]  [facility]    Current Outpatient Rx  Name  Route  Sig  Dispense  Refill  . Biotin w/ Vitamins C & E (HAIR SKIN & NAILS GUMMIES) 1250-7.5-7.5 MCG-MG-UNT CHEW   Oral   Chew 1 each by mouth daily.         . clonazePAM (KLONOPIN) 0.5 MG tablet   Oral   Take 1 tablet (0.5 mg total) by mouth  2 (two) times daily as needed for anxiety.   60 tablet   0   . fluconazole (DIFLUCAN) 150 MG tablet   Oral   Take 1 tablet (150 mg total) by mouth daily.   1 tablet   0   . guaiFENesin-codeine 100-10 MG/5ML syrup   Oral   Take 10 mLs by mouth every 4 (four) hours as needed for cough.   180 mL   0   . ibuprofen (ADVIL,MOTRIN) 200 MG tablet   Oral   Take 600 mg by mouth daily as needed for moderate pain (pain).          Marland Kitchen. levofloxacin (LEVAQUIN) 500 MG tablet   Oral   Take 1 tablet (500 mg total) by mouth daily.   10 tablet   0   . Melatonin 5 MG TABS   Oral   Take 20 mg by mouth at bedtime.          . metroNIDAZOLE (METROGEL) 0.75 % vaginal gel   Vaginal   Place 1 Applicatorful vaginally at bedtime. Apply one applicatorful to vagina at bedtime for 5 days   70 g   1   . Multiple Vitamins-Minerals (MULTIVITAMIN  GUMMIES WOMENS) CHEW   Oral   Chew 1 each by mouth daily.         Marland Kitchen oxyCODONE-acetaminophen (PERCOCET) 7.5-325 MG per tablet   Oral   Take 1 tablet by mouth every 8 (eight) hours as needed for severe pain.   90 tablet   0   . predniSONE (DELTASONE) 10 MG tablet   Oral   Take 5 tablets (50 mg total) by mouth daily with breakfast.   25 tablet   0   . pregabalin (LYRICA) 150 MG capsule   Oral   Take 1 capsule (150 mg total) by mouth 2 (two) times daily.   60 capsule   0   . promethazine (PHENERGAN) 25 MG suppository   Rectal   Place 25 mg rectally every 6 (six) hours as needed for nausea or vomiting.         . ranitidine (ZANTAC) 150 MG tablet   Oral   Take 150 mg by mouth 2 (two) times daily as needed for heartburn.            Allergies Wellbutrin; Penicillins; Lasix; and Nitrofurantoin monohyd macro  Family History  Problem Relation Age of Onset  . Breast cancer Paternal Grandmother   . Diabetes Maternal Grandmother   . Hypertension Father   . Sleep apnea Mother   . Diabetes Maternal Aunt   . Diabetes Maternal Uncle      Social History History  Substance Use Topics  . Smoking status: Current Every Day Smoker -- 1.00 packs/day  . Smokeless tobacco: Never Used  . Alcohol Use: No     Comment: Occasional    Review of Systems Constitutional: Positive for fever/chills and body aches Eyes: No visual changes. ENT: No sore throat. Cardiovascular: Denies chest pain. Respiratory: Denies shortness of breath. Positive for cough. Gastrointestinal: No abdominal pain.  No nausea, no vomiting.  No diarrhea.  No constipation. Genitourinary: Negative for dysuria. Musculoskeletal: Negative for back pain. Skin: Negative for rash. Neurological: Negative for headaches, focal weakness or numbness.  10-point ROS otherwise negative.  ____________________________________________   PHYSICAL EXAM:  VITAL SIGNS: ED Triage Vitals  Enc Vitals Group     BP 12/30/14 1731 132/84 mmHg     Pulse Rate 12/30/14 1731 92     Resp 12/30/14 1731 20     Temp 12/30/14 1731 98.4 F (36.9 C)     Temp Source 12/30/14 1731 Oral     SpO2 12/30/14 1731 97 %     Weight 12/30/14 1731 145 lb (65.772 kg)     Height 12/30/14 1731  (1.575 m)     Head Cir --      Peak Flow --      Pain Score 12/30/14 1733 6     Pain Loc --      Pain Edu? --      Excl. in GC? --     Constitutional: Alert and oriented. Well appearing and in no acute distress. Eyes: Conjunctivae are normal. PERRL. EOMI. Head: Atraumatic. Nose: No congestion/rhinnorhea. Mouth/Throat: Mucous membranes are moist.  Oropharynx non-erythematous. Neck: No stridor.   Cardiovascular: Normal rate, regular rhythm. Grossly normal heart sounds.  Good peripheral circulation. Respiratory: Normal respiratory effort.  No retractions. Lungs worse rhonchi scattered bilaterally with minimal wheezing. Gastrointestinal: Soft and nontender. No distention. No abdominal bruits. No CVA tenderness. Musculoskeletal: No lower extremity tenderness nor edema.  No joint  effusions. Neurologic:  Normal speech and language. No gross focal neurologic deficits are appreciated.  Speech is normal. No gait instability. Skin:  Skin is warm, dry and intact. No rash noted. Psychiatric: Mood and affect are normal. Speech and behavior are normal.  ____________________________________________   LABS (all labs ordered are listed, but only abnormal results are displayed)  Labs Reviewed  CBC WITH DIFFERENTIAL/PLATELET   ____________________________________________   RADIOLOGY  No pneumonia interpreted by radiologist reviewed by myself. ____________________________________________   PROCEDURES  Procedure(s) performed: None  Critical Care performed: No  ____________________________________________   INITIAL IMPRESSION / ASSESSMENT AND PLAN / ED COURSE  Pertinent labs & imaging results that were available during my care of the patient were reviewed by me and considered in my medical decision making (see chart for details).  Bronchitis. Rx given for Levaquin 500 mg daily 10, Robitussin-AC, prednisone 50 mg daily 5 days and Diflucan 150 by mouth 1. She voices no other emergency medical complaints at this visit and will return to the ER with any worsening symptomology. Patient understands and will follow-up with her PCP as directed. ____________________________________________   FINAL CLINICAL IMPRESSION(S) / ED DIAGNOSES  Final diagnoses:  Acute bronchitis, unspecified organism     Evangeline Dakin, PA-C 12/30/14 1859  Sharyn Creamer, MD 12/30/14 2120

## 2014-12-30 NOTE — ED Notes (Signed)
Pt states she has cough, congestion in her chest. Finished a zpak yesterday.  cig smoker.  Intermittent fever.

## 2014-12-30 NOTE — ED Notes (Signed)
Patient in good condition at discharge.  Respirations even and nonlabored.  Patient ambulatory with no obvious difficulty.

## 2014-12-31 ENCOUNTER — Emergency Department: Payer: Medicaid Other

## 2014-12-31 ENCOUNTER — Emergency Department
Admission: EM | Admit: 2014-12-31 | Discharge: 2015-01-01 | Disposition: A | Discharge status: Home / Self Care | Payer: Medicaid Other | Attending: Emergency Medicine | Admitting: Emergency Medicine

## 2014-12-31 ENCOUNTER — Encounter: Payer: Self-pay | Admitting: Emergency Medicine

## 2014-12-31 DIAGNOSIS — Z880 Allergy status to penicillin: Secondary | ICD-10-CM | POA: Insufficient documentation

## 2014-12-31 DIAGNOSIS — F419 Anxiety disorder, unspecified: Secondary | ICD-10-CM | POA: Insufficient documentation

## 2014-12-31 DIAGNOSIS — R51 Headache: Secondary | ICD-10-CM | POA: Insufficient documentation

## 2014-12-31 DIAGNOSIS — R112 Nausea with vomiting, unspecified: Secondary | ICD-10-CM | POA: Insufficient documentation

## 2014-12-31 DIAGNOSIS — Z7952 Long term (current) use of systemic steroids: Secondary | ICD-10-CM | POA: Diagnosis not present

## 2014-12-31 DIAGNOSIS — Z79899 Other long term (current) drug therapy: Secondary | ICD-10-CM | POA: Insufficient documentation

## 2014-12-31 DIAGNOSIS — Z720 Tobacco use: Secondary | ICD-10-CM | POA: Insufficient documentation

## 2014-12-31 DIAGNOSIS — R519 Headache, unspecified: Secondary | ICD-10-CM

## 2014-12-31 LAB — CBC WITH DIFFERENTIAL/PLATELET
BASOS ABS: 0.1 10*3/uL (ref 0–0.1)
BASOS PCT: 1 %
Eosinophils Absolute: 0 10*3/uL (ref 0–0.7)
Eosinophils Relative: 0 %
HCT: 43.2 % (ref 35.0–47.0)
Hemoglobin: 14.4 g/dL (ref 12.0–16.0)
LYMPHS PCT: 32 %
Lymphs Abs: 2.8 10*3/uL (ref 1.0–3.6)
MCH: 29.6 pg (ref 26.0–34.0)
MCHC: 33.3 g/dL (ref 32.0–36.0)
MCV: 88.7 fL (ref 80.0–100.0)
Monocytes Absolute: 0.6 10*3/uL (ref 0.2–0.9)
Monocytes Relative: 7 %
NEUTROS ABS: 5.4 10*3/uL (ref 1.4–6.5)
NEUTROS PCT: 60 %
PLATELETS: 253 10*3/uL (ref 150–440)
RBC: 4.87 MIL/uL (ref 3.80–5.20)
RDW: 13.1 % (ref 11.5–14.5)
WBC: 9 10*3/uL (ref 3.6–11.0)

## 2014-12-31 LAB — COMPREHENSIVE METABOLIC PANEL
ALT: 17 U/L (ref 14–54)
AST: 21 U/L (ref 15–41)
Albumin: 4 g/dL (ref 3.5–5.0)
Alkaline Phosphatase: 69 U/L (ref 38–126)
Anion gap: 10 (ref 5–15)
BILIRUBIN TOTAL: 0.4 mg/dL (ref 0.3–1.2)
BUN: 14 mg/dL (ref 6–20)
CHLORIDE: 104 mmol/L (ref 101–111)
CO2: 24 mmol/L (ref 22–32)
Calcium: 9.5 mg/dL (ref 8.9–10.3)
Creatinine, Ser: 0.86 mg/dL (ref 0.44–1.00)
GFR calc Af Amer: >60 mL/min (ref >60)
GFR calc non Af Amer: >60 mL/min (ref >60)
Glucose, Bld: 98 mg/dL (ref 65–99)
POTASSIUM: 3.6 mmol/L (ref 3.5–5.1)
SODIUM: 138 mmol/L (ref 135–145)
Total Protein: 7.7 g/dL (ref 6.5–8.1)

## 2014-12-31 MED ORDER — FENTANYL CITRATE (PF) 100 MCG/2ML IJ SOLN
INTRAMUSCULAR | Status: AC
  Filled 2014-12-31: qty 2

## 2014-12-31 MED ORDER — DIPHENHYDRAMINE HCL 50 MG/ML IJ SOLN
25.0000 mg | Freq: Once | INTRAMUSCULAR | Status: AC
  Administered 2014-12-31: 25 mg via INTRAVENOUS

## 2014-12-31 MED ORDER — METOCLOPRAMIDE HCL 5 MG/ML IJ SOLN
INTRAMUSCULAR | Status: AC
  Filled 2014-12-31: qty 4

## 2014-12-31 MED ORDER — BUTALBITAL-APAP-CAFFEINE 50-325-40 MG PO TABS: 1.0000 | ORAL_TABLET | Freq: Four times a day (QID) | ORAL | Status: DC | PRN

## 2014-12-31 MED ORDER — METOCLOPRAMIDE HCL 5 MG/ML IJ SOLN
20.0000 mg | Freq: Once | INTRAVENOUS | Status: AC
  Administered 2014-12-31: 20 mg via INTRAVENOUS

## 2014-12-31 MED ORDER — KETOROLAC TROMETHAMINE 30 MG/ML IJ SOLN
30.0000 mg | Freq: Once | INTRAMUSCULAR | Status: AC
  Administered 2014-12-31: 30 mg via INTRAVENOUS

## 2014-12-31 MED ORDER — ONDANSETRON HCL 4 MG/2ML IJ SOLN
4.0000 mg | Freq: Once | INTRAMUSCULAR | Status: AC
  Administered 2014-12-31: 4 mg via INTRAVENOUS

## 2014-12-31 MED ORDER — HYDROMORPHONE HCL 1 MG/ML IJ SOLN
INTRAMUSCULAR | Status: AC
  Administered 2014-12-31: 0.5 mg via INTRAVENOUS
  Filled 2014-12-31: qty 1

## 2014-12-31 MED ORDER — HYDROMORPHONE HCL 1 MG/ML IJ SOLN
0.5000 mg | Freq: Once | INTRAMUSCULAR | Status: AC
  Administered 2014-12-31: 0.5 mg via INTRAVENOUS

## 2014-12-31 MED ORDER — ONDANSETRON HCL 4 MG/2ML IJ SOLN: 4.0000 mg | Freq: Once | INTRAMUSCULAR | Status: DC

## 2014-12-31 MED ORDER — ONDANSETRON HCL 4 MG/2ML IJ SOLN
INTRAMUSCULAR | Status: AC
  Administered 2014-12-31: 4 mg via INTRAVENOUS
  Filled 2014-12-31: qty 2

## 2014-12-31 MED ORDER — KETOROLAC TROMETHAMINE 30 MG/ML IJ SOLN
INTRAMUSCULAR | Status: AC
  Administered 2014-12-31: 30 mg via INTRAVENOUS
  Filled 2014-12-31: qty 1

## 2014-12-31 MED ORDER — DIPHENHYDRAMINE HCL 50 MG/ML IJ SOLN
INTRAMUSCULAR | Status: AC
  Administered 2014-12-31: 25 mg via INTRAVENOUS
  Filled 2014-12-31: qty 1

## 2014-12-31 MED ORDER — FENTANYL CITRATE (PF) 100 MCG/2ML IJ SOLN: 50.0000 ug | Freq: Once | INTRAMUSCULAR | Status: DC

## 2014-12-31 NOTE — ED Notes (Signed)
Pt to rm 8 via EMS.  Per EMS pt seen here yesterday and dx with bronchitis.  Pt states HA started this morning when leaning over and coughing.  Pt states pain happened again tonight.  Pt vomiting upon arrival.

## 2014-12-31 NOTE — ED Notes (Signed)
Pt back from CT requesting pain meds. MD aware

## 2014-12-31 NOTE — ED Provider Notes (Signed)
[facility] Emergency Department Provider Note  ____________________________________________  Time seen: On arrival, via EMS  I have reviewed the triage vital signs and the nursing notes.   HISTORY  Chief Complaint Headache and Emesis    HPI Casey Hodges is a 39 y.o. female who presents with headache and nausea and vomiting. Patient recently treated for bronchitis in the emergency department. She reports she was coughing today and leaned forward and developed a headache nausea this apparently improved without treatment but then it happened again later in the day and patient called EMS. She denies neuro deficits. No fevers no chills. No neck pain.She complains of a global throbbing headache     Past Medical History  Diagnosis Date  . Bipolar affective     pt reported  . Uterine polyp   . Eating disorder     Under control per patient  . Renal disorder   . Painful menstrual periods   . Abnormal vaginal Pap smear     10+ years ago- no colpo repeat was normal  . Painful intercourse   . GERD (gastroesophageal reflux disease)   . VWD (acquired von Willebrand's disease)   . Anxiety   . Spinal stenosis   . Kidney stone   . Bipolar disorder   . AR (allergic rhinitis)   . Pelvic pain in female   . Fibromyalgia     Patient Active Problem List   Diagnosis Date Noted  . Arthritis 12/19/2014  . Calculus of kidney 12/13/2014  . Patchy loss of hair 12/13/2014  . Anxiety disorder 12/13/2014  . Postural dizziness 12/13/2014  . Subcutaneous cyst 12/13/2014  . Nephrolithiasis 05/23/2012  . Chronic pelvic pain in female 05/23/2012  . Fibromyalgia 05/23/2012  . Bipolar disorder 06/05/2011    Past Surgical History  Procedure Laterality Date  . Tubal ligation    . Laparoscopy abdomen diagnostic    . Hysteroscopy      removed polyps  . [facility]  2015    [facility]    Current Outpatient Rx  Name  Route  Sig  Dispense   Refill  . Biotin w/ Vitamins C & E (HAIR SKIN & NAILS GUMMIES) 1250-7.5-7.5 MCG-MG-UNT CHEW   Oral   Chew 1 each by mouth daily.         . clonazePAM (KLONOPIN) 0.5 MG tablet   Oral   Take 1 tablet (0.5 mg total) by mouth 2 (two) times daily as needed for anxiety.   60 tablet   0   . fluconazole (DIFLUCAN) 150 MG tablet   Oral   Take 1 tablet (150 mg total) by mouth daily.   1 tablet   0   . guaiFENesin-codeine 100-10 MG/5ML syrup   Oral   Take 10 mLs by mouth every 4 (four) hours as needed for cough.   180 mL   0   . ibuprofen (ADVIL,MOTRIN) 200 MG tablet   Oral   Take 600 mg by mouth daily as needed for moderate pain (pain).          Marland Kitchen. levofloxacin (LEVAQUIN) 500 MG tablet   Oral   Take 1 tablet (500 mg total) by mouth daily.   10 tablet   0   . Melatonin 5 MG TABS   Oral   Take 20 mg by mouth at bedtime.          . metroNIDAZOLE (METROGEL) 0.75 % vaginal gel   Vaginal   Place 1 Applicatorful  vaginally at bedtime. Apply one applicatorful to vagina at bedtime for 5 days   70 g   1   . Multiple Vitamins-Minerals (MULTIVITAMIN GUMMIES WOMENS) CHEW   Oral   Chew 1 each by mouth daily.         Marland Kitchen oxyCODONE-acetaminophen (PERCOCET) 7.5-325 MG per tablet   Oral   Take 1 tablet by mouth every 8 (eight) hours as needed for severe pain.   90 tablet   0   . predniSONE (DELTASONE) 10 MG tablet   Oral   Take 5 tablets (50 mg total) by mouth daily with breakfast.   25 tablet   0   . pregabalin (LYRICA) 150 MG capsule   Oral   Take 1 capsule (150 mg total) by mouth 2 (two) times daily.   60 capsule   0   . promethazine (PHENERGAN) 25 MG suppository   Rectal   Place 25 mg rectally every 6 (six) hours as needed for nausea or vomiting.         . ranitidine (ZANTAC) 150 MG tablet   Oral   Take 150 mg by mouth 2 (two) times daily as needed for heartburn.            Allergies Wellbutrin; Penicillins; Lasix; and Nitrofurantoin monohyd macro  Family  History  Problem Relation Age of Onset  . Breast cancer Paternal Grandmother   . Diabetes Maternal Grandmother   . Hypertension Father   . Sleep apnea Mother   . Diabetes Maternal Aunt   . Diabetes Maternal Uncle     Social History History  Substance Use Topics  . Smoking status: Current Every Day Smoker -- 1.00 packs/day  . Smokeless tobacco: Never Used  . Alcohol Use: No     Comment: Occasional    Review of Systems  Constitutional: Negative for fever. Eyes: Negative for visual changes. ENT: Negative for sore throat Cardiovascular: Negative for chest pain. Respiratory: Negative for shortness of breath. Gastrointestinal: Negative for abdominal pain, positive for nausea and vomiting Genitourinary: Negative for dysuria. Musculoskeletal: Negative for back pain. Skin: Negative for rash. Neurological: Positive for headache Psychiatric: Positive for anxiety  10-point ROS otherwise negative.  ____________________________________________   PHYSICAL EXAM:  VITAL SIGNS: ED Triage Vitals  Enc Vitals Group     BP 12/31/14 2056 132/108 mmHg     Pulse Rate 12/31/14 2056 90     Resp 12/31/14 2056 20     Temp 12/31/14 2056 98.3 F (36.8 C)     Temp Source 12/31/14 2056 Oral     SpO2 12/31/14 2056 100 %     Weight 12/31/14 2056 140 lb (63.504 kg)     Height 12/31/14 2056  (1.575 m)     Head Cir --      Peak Flow --      Pain Score 12/31/14 2058 10     Pain Loc --      Pain Edu? --      Excl. in GC? --      Constitutional: Alert and oriented. Well appearing and in no distress. Eyes: Conjunctivae are normal.  ENT   Head: Normocephalic and atraumatic.   Mouth/Throat: Mucous membranes are moist. Cardiovascular: Normal rate, regular rhythm. Normal and symmetric distal pulses are present in all extremities. No murmurs, rubs, or gallops. Respiratory: Normal respiratory effort without tachypnea nor retractions. Breath sounds are clear and equal bilaterally.   Gastrointestinal: Soft and non-tender in all quadrants. No distention. There is no CVA  tenderness. Genitourinary: deferred Musculoskeletal: Nontender with normal range of motion in all extremities. No lower extremity tenderness nor edema. Neurologic:  Normal speech and language. No gross focal neurologic deficits are appreciated. Skin:  Skin is warm, dry and intact. No rash noted. Psychiatric: Patient with considerable anxiety  ____________________________________________    LABS (pertinent positives/negatives)  Labs Reviewed  CBC WITH DIFFERENTIAL/PLATELET  COMPREHENSIVE METABOLIC PANEL    ____________________________________________   EKG  None  ____________________________________________    RADIOLOGY I have personally reviewed any xrays that were ordered on this patient: CT head unremarkable  ____________________________________________   PROCEDURES  Procedure(s) performed: none  Critical Care performed: none  ____________________________________________   INITIAL IMPRESSION / ASSESSMENT AND PLAN / ED COURSE  Pertinent labs & imaging results that were available during my care of the patient were reviewed by me and considered in my medical decision making (see chart for details).  Patient given Zofran 4 mg IV, normal saline  IV.  ----------------------------------------- 10:32 PM on 12/31/2014 -----------------------------------------  CT head negative, we will give Toradol for headache and Benadryl and Reglan. I suspect this is a migraine headache area patient has no neck pain, no neuro deficits, no fever no chills, no altered mental status. Normal CT head.   ----------------------------------------- 11:18 PM on 12/31/2014 -----------------------------------------  Patient reports improvement but still having some discomfort, we will add on Dilaudid IV 0.5 mg.   Will sign out to Dr. Pershing Proud to re-evaluate the  patient    ____________________________________________   FINAL CLINICAL IMPRESSION(S) / ED DIAGNOSES  Final diagnoses:  Acute nonintractable headache, unspecified headache type     Jene Every, MD 12/31/14 2320

## 2014-12-31 NOTE — Discharge Instructions (Signed)

## 2015-01-01 NOTE — ED Provider Notes (Signed)
  Physical Exam  BP 132/108 mmHg  Pulse 90  Temp(Src) 98.3 F (36.8 C) (Oral)  Resp 20  Ht 5\' 2"  (1.575 m)  Wt 140 lb (63.504 kg)  BMI 25.60 kg/m2  SpO2 100%  LMP 10/29/2013  Physical Exam Sleeping when I entered the room. Continues to have a nonfocal and normal neuro exam. ED Course  Procedures  MDM ----------------------------------------- 12:26 AM on 01/01/2015 -----------------------------------------  Patient resting comfortably at this time with pain relieved after treatment. Reassuring labs and imaging. We'll discharge to home.      Myrna Blazeravid Matthew Dimitrious Micciche, MD 01/01/15 [phone]

## 2015-01-05 ENCOUNTER — Encounter
Admission: RE | Admit: 2015-01-05 | Discharge: 2015-01-05 | Disposition: A | Discharge status: Home / Self Care | Payer: Medicaid Other | Source: Ambulatory Visit | Attending: Obstetrics and Gynecology | Admitting: Obstetrics and Gynecology

## 2015-01-05 ENCOUNTER — Ambulatory Visit (INDEPENDENT_AMBULATORY_CARE_PROVIDER_SITE_OTHER): Payer: Medicaid Other | Admitting: Obstetrics and Gynecology

## 2015-01-05 ENCOUNTER — Encounter: Payer: Self-pay | Admitting: Obstetrics and Gynecology

## 2015-01-05 VITALS — BP 122/75 | HR 92 | Ht 62.0 in | Wt 143.2 lb

## 2015-01-05 DIAGNOSIS — R102 Pelvic and perineal pain: Secondary | ICD-10-CM

## 2015-01-05 DIAGNOSIS — Z01812 Encounter for preprocedural laboratory examination: Secondary | ICD-10-CM | POA: Insufficient documentation

## 2015-01-05 DIAGNOSIS — Z01818 Encounter for other preprocedural examination: Secondary | ICD-10-CM

## 2015-01-05 LAB — CBC WITH DIFFERENTIAL/PLATELET
BASOS PCT: 0 %
Basophils Absolute: 0 10*3/uL (ref 0–0.1)
Eosinophils Absolute: 0 10*3/uL (ref 0–0.7)
Eosinophils Relative: 0 %
HEMATOCRIT: 42.3 % (ref 35.0–47.0)
Hemoglobin: 14 g/dL (ref 12.0–16.0)
LYMPHS ABS: 1.9 10*3/uL (ref 1.0–3.6)
Lymphocytes Relative: 13 %
MCH: 29.3 pg (ref 26.0–34.0)
MCHC: 33 g/dL (ref 32.0–36.0)
MCV: 88.9 fL (ref 80.0–100.0)
MONO ABS: 0.7 10*3/uL (ref 0.2–0.9)
Monocytes Relative: 5 %
NEUTROS ABS: 12.2 10*3/uL — AB (ref 1.4–6.5)
Neutrophils Relative %: 82 %
Platelets: 264 10*3/uL (ref 150–440)
RBC: 4.76 MIL/uL (ref 3.80–5.20)
RDW: 13.4 % (ref 11.5–14.5)
WBC: 14.9 10*3/uL — ABNORMAL HIGH (ref 3.6–11.0)

## 2015-01-05 LAB — RAPID HIV SCREEN (HIV 1/2 AB+AG)
HIV 1/2 Antibodies: NONREACTIVE
HIV-1 P24 ANTIGEN - HIV24: NONREACTIVE

## 2015-01-05 NOTE — OR Nursing (Addendum)
Per patient Dr. Greggory Keenefrancesco aware of VonWillebrands. She is to take her DDVAP.  Chart to Anesthesia for review.

## 2015-01-05 NOTE — Patient Instructions (Signed)
  Your procedure is scheduled on: Monday 01/10/2015 Report to Day Surgery. 2nd floor Medical mall Entrance To find out your arrival time please call [phone] between 1PM - 3PM on Friday 01/07/2015.  Remember: Instructions that are not followed completely may result in serious medical risk, up to and including death, or upon the discretion of your surgeon and anesthesiologist your surgery may need to be rescheduled.    __x__ 1. Do not eat food or drink liquids after midnight. No gum chewing or hard candies.     __x__ 2. No Alcohol for 24 hours before or after surgery.   ____ 3. Bring all medications with you on the day of surgery if instructed.    __x__ 4. Notify your doctor if there is any change in your medical condition     (cold, fever, infections).     Do not wear jewelry, make-up, hairpins, clips or nail polish.  Do not wear lotions, powders, or perfumes.  Do not shave 48 hours prior to surgery. Men may shave face and neck.  Do not bring valuables to the hospital.    [facility] is not responsible for any belongings or valuables.               Contacts, dentures or bridgework may not be worn into surgery.  Leave your suitcase in the car. After surgery it may be brought to your room.  For patients admitted to the hospital, discharge time is determined by your                treatment team.   Patients discharged the day of surgery will not be allowed to drive home.   Please read over the following fact sheets that you were given:   Surgical Site Infection Prevention   __x__ Take these medicines the morning of surgery with A SIP OF WATER:    1. clonazepam  2. Oxycodone if needed  3.   4.  5.  6.  ____ Fleet Enema (as directed)   __x__ Use CHG Soap as directed  ____ Use inhalers on the day of surgery  ____ Stop metformin 2 days prior to surgery    ____ Take 1/2 of usual insulin dose the night before surgery and none on the morning of surgery.   ____ Stop  Coumadin/Plavix/aspirin on   __x__ Stop Anti-inflammatories on stop motrin today    __x__ Stop supplements until after surgery.  Stop gummy vitamins and melatonin  ____ Bring C-Pap to the hospital.

## 2015-01-05 NOTE — Progress Notes (Addendum)
Patient ID: Casey Hodges, female   DOB: 1975-09-20, 39 y.o.   MRN: [id number] Pre-op appt- lap with bx on 01/10/2015  Subjective:    Patient is a 39 y.o. G1P1037female scheduled for laparoscopy with biopsies. Indications for procedure are chronic pelvic pain  Patient is status post LAVH, bilateral salpingectomy in 2015.  She continues to experience pelvic pain/dyspareunia.  Pain is also associated with defecation.  Sharp stabbing type component is noted intermittently.Marland Kitchen Pelvic ultrasound 12/01/2014 was normal. Review of pathology from hysterectomy demonstrates small fibroid without other abnormalities.  No evidence of endometriosis/adenomyosis.  Pertinent Gynecological History: Menses: status post hysterectomy Bleeding: none Contraception: status post hysterectomy Last mammogram: NA Date: NA Last pap: NA Date: NA  Discussed Blood/Blood Products: yes   Menstrual History: OB History    Gravida Para Term Preterm AB TAB SAB Ectopic Multiple Living   Menarche age:   na  Past Medical History  Diagnosis Date  . Bipolar affective     pt reported  . Uterine polyp   . Eating disorder     Under control per patient  . Renal disorder   . Painful menstrual periods   . Abnormal vaginal Pap smear     10+ years ago- no colpo repeat was normal  . Painful intercourse   . GERD (gastroesophageal reflux disease)   . VWD (acquired von Willebrand's disease)   . Anxiety   . Spinal stenosis   . Kidney stone   . Bipolar disorder   . AR (allergic rhinitis)   . Pelvic pain in female   . Fibromyalgia     Past Surgical History  Procedure Laterality Date  . Tubal ligation    . Laparoscopy abdomen diagnostic    . Hysteroscopy      removed polyps  . Laparoscopic vaginal hysterectomy  2015    at [facility]    OB History  Gravida Para Term Preterm AB SAB TAB Ectopic Multiple Living  # Outcome Date GA Lbr Len/2nd Weight Sex Delivery Anes PTL Lv  1 Term  1999   7 lb 9 oz (3.43 kg) F Vag-Spont   Y      History   Social History  . Marital Status: Widowed    Spouse Name: N/A  . Number of Children: 1  . Years of Education: N/A   Occupational History  . Un-employed due to bipolar    Social History Main Topics  . Smoking status: Current Every Day Smoker -- 1.00 packs/day  . Smokeless tobacco: Never Used  . Alcohol Use: No     Comment: Occasional  . Drug Use: No  . Sexual Activity: No   Other Topics Concern  . None   Social History Narrative   Regular Exercise -  NO   Daily Caffeine Use:  1 cup coffee in am      1 child, one step child          Family History  Problem Relation Age of Onset  . Breast cancer Paternal Grandmother   . Diabetes Maternal Grandmother   . Hypertension Father   . Sleep apnea Mother   . Diabetes Maternal Aunt   . Diabetes Maternal Uncle      (Not in a hospital admission)  Allergies  Allergen Reactions  . Wellbutrin [Bupropion Hcl] Other (See Comments)  Mental breakdown? Suicidal   . Penicillins Other (See Comments)    Yeast infections  . Lasix [Furosemide] Rash  . Nitrofurantoin Monohyd Macro Rash    Review of Systems Constitutional: No recent fever/chills/sweats Respiratory: No recent cough/bronchitis Cardiovascular: No chest pain Gastrointestinal: No recent nausea/vomiting/diarrhea Genitourinary: No UTI symptoms Hematologic/lymphatic: No recent blood thinner use  History of coagulopathy-von Willebrand's disease; uses DDAVP prior to surgery   Objective:    BP 122/75 mmHg  Pulse 92  Ht 5\' 2"  (1.575 m)  Wt 143 lb 3.2 oz (64.955 kg)  BMI 26.18 kg/m2  LMP 10/29/2013  General:   Normal  Skin:   normal  HEENT:  Normal  Neck:  Supple without Adenopathy or Thyromegaly  Lungs:   Heart:              Breasts:   Abdomen:  Pelvis:  M/S   Extremeties:  Neuro:    clear to auscultation bilaterally   Normal without murmur   Not Examined   soft, non-tender; bowel sounds  normal; no masses,  no organomegaly   Exam deferred to OR  No CVAT  Warm/Dry   Normal          Assessment:  1.  Chronic pelvic pain. 2.  Status post LAVH, bilateral salpingectomy-2015   Plan:   1.  Laparoscopy with peritoneal biopsies.    Counseling: Patient is to undergo laparoscopy with peritoneal biopsies for evaluation of chronic pelvic pain.  She is understanding of the planned procedure and is aware of and is accepting of all surgical risks which include but are not limited to bleeding, infection, pelvic organ injury, need for repair, blood clots disorders, anesthesia risks, etc.  The patient does understand that if pathology is identified in the adnexal regions, possible oophorectomy may be indicated.  She has accepting of this.  All questions have been answered.  Informed consent is given.  Patient is ready and willing to proceed with surgery as scheduled  Date of Initial H&P: 01/05/2015  History reviewed, patient examined, no change in status, stable for surgery.

## 2015-01-06 ENCOUNTER — Encounter: Payer: Self-pay | Admitting: Urgent Care

## 2015-01-06 ENCOUNTER — Emergency Department
Admission: EM | Admit: 2015-01-06 | Discharge: 2015-01-06 | Discharge status: Against Medical Advice | Payer: Medicaid Other | Attending: Emergency Medicine | Admitting: Emergency Medicine

## 2015-01-06 DIAGNOSIS — Z91040 Latex allergy status: Secondary | ICD-10-CM | POA: Diagnosis not present

## 2015-01-06 DIAGNOSIS — Z7952 Long term (current) use of systemic steroids: Secondary | ICD-10-CM | POA: Insufficient documentation

## 2015-01-06 DIAGNOSIS — Z880 Allergy status to penicillin: Secondary | ICD-10-CM | POA: Diagnosis not present

## 2015-01-06 DIAGNOSIS — G43909 Migraine, unspecified, not intractable, without status migrainosus: Secondary | ICD-10-CM | POA: Diagnosis not present

## 2015-01-06 DIAGNOSIS — Z79899 Other long term (current) drug therapy: Secondary | ICD-10-CM | POA: Diagnosis not present

## 2015-01-06 DIAGNOSIS — R51 Headache: Secondary | ICD-10-CM | POA: Diagnosis present

## 2015-01-06 DIAGNOSIS — Z720 Tobacco use: Secondary | ICD-10-CM | POA: Diagnosis not present

## 2015-01-06 DIAGNOSIS — G43009 Migraine without aura, not intractable, without status migrainosus: Secondary | ICD-10-CM

## 2015-01-06 LAB — RPR: RPR: NONREACTIVE

## 2015-01-06 MED ORDER — ONDANSETRON 8 MG PO TBDP
8.0000 mg | ORAL_TABLET | Freq: Once | ORAL | Status: AC
  Administered 2015-01-06: 8 mg via ORAL

## 2015-01-06 MED ORDER — KETOROLAC TROMETHAMINE 60 MG/2ML IM SOLN
INTRAMUSCULAR | Status: AC
  Administered 2015-01-06: 60 mg via INTRAMUSCULAR
  Filled 2015-01-06: qty 2

## 2015-01-06 MED ORDER — SUMATRIPTAN SUCCINATE 6 MG/0.5ML ~~LOC~~ SOLN
6.0000 mg | Freq: Once | SUBCUTANEOUS | Status: AC
  Administered 2015-01-06: 6 mg via SUBCUTANEOUS

## 2015-01-06 MED ORDER — KETOROLAC TROMETHAMINE 60 MG/2ML IM SOLN
60.0000 mg | Freq: Once | INTRAMUSCULAR | Status: AC
  Administered 2015-01-06: 60 mg via INTRAMUSCULAR

## 2015-01-06 MED ORDER — SUMATRIPTAN SUCCINATE 6 MG/0.5ML ~~LOC~~ SOLN
SUBCUTANEOUS | Status: AC
  Administered 2015-01-06: 6 mg via SUBCUTANEOUS
  Filled 2015-01-06: qty 0.5

## 2015-01-06 MED ORDER — ONDANSETRON 8 MG PO TBDP
ORAL_TABLET | ORAL | Status: AC
  Administered 2015-01-06: 8 mg via ORAL
  Filled 2015-01-06: qty 1

## 2015-01-06 NOTE — ED Notes (Signed)
Patient's mother here now asking for the patient to be given Demerol. Charge nurse and attending MD made aware. Charge nurse to speak with patient's mother.

## 2015-01-06 NOTE — ED Notes (Signed)
MD spoke with patient and her mother. Mother asking for additional medication and she will "just take her home." Patient and parent have been offered dark room and non-pharmacological intervention - all declined. MD with [facility] for Ketoralac  IM - order to be entered by this RN. Patient has voided on herself during previous vomiting episode - scrubs provided. Patient will be given Toradol and discharged.

## 2015-01-06 NOTE — ED Notes (Signed)
RN administered medication per MD order. Following administration patient states, "If I need a ride. I can call. I need something narcotic for this. What about my Phenergan for my nausea? I am going to need phenergan and narcotics to make this go away. I have been eating Fioricet today."

## 2015-01-06 NOTE — ED Notes (Signed)
Patient vomiting - Zofran  ODT given.

## 2015-01-06 NOTE — ED Notes (Addendum)
Patient presents with migrane headache for over a week. Patient was seen on Friday and had a CT scan. Patient on ABD for bronchitis. (+) nausea. Patient asking for a "shot of something. My dad works here. He will tell. Eloise HarmanMike Childers is my dad. He knows." Patient talking incessantly in triage - I need pain medications. Patient yelling in triage ... "just knock me out."

## 2015-01-06 NOTE — Discharge Instructions (Signed)

## 2015-01-06 NOTE — ED Provider Notes (Signed)
[facility] Emergency Department Provider Note  ____________________________________________  Time seen: 3:30 AM   I have reviewed the triage vital signs and the nursing notes.   HISTORY  Chief Complaint Headache      HPI Casey Hodges is a 39 y.o. female presents with generalize headache times "a few days". Patient states current pain is 10 out of 10.Patient denies any fever no vomiting positive photophobia positive phonophobia. Patient admits to being seen recently in the emergency department for the same. Patient states "they gave me some shift that didn't work. Patient also states that she's been taking medication at home without relief.     Past Medical History  Diagnosis Date  . Bipolar affective     pt reported  . Uterine polyp   . Eating disorder     Under control per patient  . Renal disorder   . Painful menstrual periods   . Abnormal vaginal Pap smear     10+ years ago- no colpo repeat was normal  . Painful intercourse   . GERD (gastroesophageal reflux disease)   . VWD (acquired von Willebrand's disease)   . Anxiety   . Spinal stenosis   . Kidney stone   . Bipolar disorder   . AR (allergic rhinitis)   . Pelvic pain in female   . Fibromyalgia     Patient Active Problem List   Diagnosis Date Noted  . Arthritis 12/19/2014  . Calculus of kidney 12/13/2014  . Patchy loss of hair 12/13/2014  . Anxiety disorder 12/13/2014  . Postural dizziness 12/13/2014  . Subcutaneous cyst 12/13/2014  . Nephrolithiasis 05/23/2012  . Chronic pelvic pain in female 05/23/2012  . Fibromyalgia 05/23/2012  . Bipolar disorder 06/05/2011    Past Surgical History  Procedure Laterality Date  . Tubal ligation    . Laparoscopy abdomen diagnostic    . Hysteroscopy      removed polyps  . Laparoscopic vaginal hysterectomy  2015    at [facility]  . Abdominal hysterectomy      Current Outpatient Rx  Name  Route  Sig  Dispense  Refill  . Biotin w/  Vitamins C & E (HAIR SKIN & NAILS GUMMIES) 1250-7.5-7.5 MCG-MG-UNT CHEW   Oral   Chew 1 each by mouth daily.         . butalbital-acetaminophen-caffeine (FIORICET) 50-325-40 MG per tablet   Oral   Take 1-2 tablets by mouth every 6 (six) hours as needed for headache.   20 tablet   0   . clonazePAM (KLONOPIN) 0.5 MG tablet   Oral   Take 1 tablet (0.5 mg total) by mouth 2 (two) times daily as needed for anxiety.   60 tablet   0   . desmopressin (DDAVP NASAL) 0.01 % solution               . ibuprofen (ADVIL,MOTRIN) 200 MG tablet   Oral   Take 600 mg by mouth daily as needed for moderate pain (pain).          . Melatonin 5 MG TABS   Oral   Take 20 mg by mouth at bedtime.          . Multiple Vitamins-Minerals (MULTIVITAMIN GUMMIES WOMENS) CHEW   Oral   Chew 3 each by mouth daily.          Marland Kitchen oxyCODONE-acetaminophen (PERCOCET) 7.5-325 MG per tablet   Oral   Take 1 tablet by mouth every 8 (eight) hours as needed  for severe pain.   90 tablet   0   . predniSONE (DELTASONE) 10 MG tablet   Oral   Take 5 tablets (50 mg total) by mouth daily with breakfast.   25 tablet   0     Allergies Wellbutrin; Penicillins; Lasix; and Nitrofurantoin monohyd macro  Family History  Problem Relation Age of Onset  . Breast cancer Paternal Grandmother   . Diabetes Maternal Grandmother   . Hypertension Father   . Sleep apnea Mother   . Diabetes Maternal Aunt   . Diabetes Maternal Uncle     Social History History  Substance Use Topics  . Smoking status: Current Every Day Smoker -- 1.00 packs/day  . Smokeless tobacco: Never Used  . Alcohol Use: No     Comment: Occasional    Review of Systems  Constitutional: Negative for fever. Eyes: Negative for visual changes. ENT: Negative for sore throat. Cardiovascular: Negative for chest pain. Respiratory: Negative for shortness of breath. Gastrointestinal: Negative for abdominal pain, vomiting and diarrhea. Genitourinary:  Negative for dysuria. Musculoskeletal: Negative for back pain. Skin: Negative for rash. Neurological: Positive for headaches, negative for focal weakness or numbness.   10-point ROS otherwise negative.  ____________________________________________   PHYSICAL EXAM:  VITAL SIGNS: ED Triage Vitals  Enc Vitals Group     BP 01/06/15 0245 127/88 mmHg     Pulse Rate 01/06/15 0245 83     Resp 01/06/15 0245 18     Temp 01/06/15 0245 98.4 F (36.9 C)     Temp Source 01/06/15 0245 Oral     SpO2 01/06/15 0245 100 %     Weight 01/06/15 0245 143 lb (64.864 kg)     Height 01/06/15 0245 5\' 2"  (1.575 m)     Head Cir --      Peak Flow --      Pain Score 01/06/15 0246 10     Pain Loc --      Pain Edu? --      Excl. in GC? --     Constitutional: Alert and oriented. Apparent distress Eyes: Conjunctivae are normal. PERRL. Normal extraocular movements. ENT   Head: Normocephalic and atraumatic.   Nose: No congestion/rhinnorhea.   Mouth/Throat: Mucous membranes are moist.   Neck: No stridor. Hematological/Lymphatic/Immunilogical: No cervical lymphadenopathy. Cardiovascular: Normal rate, regular rhythm. Normal and symmetric distal pulses are present in all extremities. No murmurs, rubs, or gallops. Respiratory: Normal respiratory effort without tachypnea nor retractions. Breath sounds are clear and equal bilaterally. No wheezes/rales/rhonchi. Gastrointestinal: Soft and nontender. No distention. There is no CVA tenderness. Genitourinary: deferred Musculoskeletal: Nontender with normal range of motion in all extremities. No joint effusions.  No lower extremity tenderness nor edema. Neurologic:  Normal speech and language. No gross focal neurologic deficits are appreciated. Speech is normal.  Skin:  Skin is warm, dry and intact. No rash noted. Psychiatric: Mood and affect are normal. Speech and behavior are normal. Patient exhibits appropriate insight and judgment.      INITIAL  IMPRESSION / ASSESSMENT AND PLAN / ED COURSE  Pertinent labs & imaging results that were available during my care of the patient were reviewed by me and considered in my medical decision making (see chart for details).  Patient refuses to stay for further treatment in the emergency department and as such left AMA  ____________________________________________   FINAL CLINICAL IMPRESSION(S) / ED DIAGNOSES  Final diagnoses:  Migraine without aura and without status migrainosus, not intractable      Duke Salviaandolph  Dewayne Shorter, MD 01/11/15 [phone]

## 2015-01-06 NOTE — ED Notes (Signed)
Conversation had with patient's mother by ED charge nurse; witnessed by this RN. Mother made aware of behavior since patient has arrived to the ED. Mother advised that patient has cursed and demanded IV narcotics and phenergan since arrival. Mother advising that she is a migraine suffer herself and knows what patient is going through; also wanting ED staff to be aware of patient's recent life events that includes the loss of her husband. Mother reporting that she takes Valium herself at home - plans are to take patient home and "give her one of my Valium". Mother is reporting that patient is bipolar - states, "She is not always rational in her thinking." Mother asking for Toradol injection as previously offered. Discussed with Dr. Manson PasseyBrown. MD to triage 1 to speak with mother regarding plan of care.

## 2015-01-06 NOTE — ED Notes (Signed)
Patient called this RN over to speak with her in the lobby. Patient states, "I called my momma in case they want to give me anything stronger." RN advised patient that attending physician had been consulted and that he was not willing to prescribe narcotics tonight. Patient states, "What if I need it?" RN advised patient that MD will provide non-narcotic treatment strategies that will assist in the treatment of her symptoms. Patient states, "Who ever you are talking to. Stop talking to them. I want to be seen by the other doctor." RN advised that patient would be seen by one of the providers in the ED and that she had not been assigned to a physician as of yet. Patient begins to yell in the lobby, "Fuck ya'll. Fuck ya'll. I [facility] Attending MD and charge nurse made aware of the situation as it is presenting in the lobby.

## 2015-01-06 NOTE — OR Nursing (Signed)
Chart reviewed by Dr Henrene HawkingKephart. OK to proceed.

## 2015-01-06 NOTE — ED Notes (Signed)
Brown,MD consulted. MD made aware of presenting complaints and triage assessment. MD with [facility] for Sumatriptan  SQ now. Orders to be entered and carried by this RN.

## 2015-01-10 ENCOUNTER — Encounter
Admission: RE | Disposition: A | Discharge status: Home / Self Care | Payer: Self-pay | Source: Ambulatory Visit | Attending: Obstetrics and Gynecology

## 2015-01-10 ENCOUNTER — Encounter: Payer: Self-pay | Admitting: Anesthesiology

## 2015-01-10 ENCOUNTER — Ambulatory Visit
Admission: RE | Admit: 2015-01-10 | Discharge: 2015-01-10 | Disposition: A | Discharge status: Home / Self Care | Payer: Medicaid Other | Source: Ambulatory Visit | Attending: Obstetrics and Gynecology | Admitting: Obstetrics and Gynecology

## 2015-01-10 ENCOUNTER — Ambulatory Visit: Payer: Medicaid Other | Admitting: Anesthesiology

## 2015-01-10 DIAGNOSIS — N898 Other specified noninflammatory disorders of vagina: Secondary | ICD-10-CM | POA: Diagnosis not present

## 2015-01-10 DIAGNOSIS — G8929 Other chronic pain: Secondary | ICD-10-CM | POA: Diagnosis not present

## 2015-01-10 DIAGNOSIS — Z9851 Tubal ligation status: Secondary | ICD-10-CM | POA: Insufficient documentation

## 2015-01-10 DIAGNOSIS — N736 Female pelvic peritoneal adhesions (postinfective): Secondary | ICD-10-CM | POA: Diagnosis not present

## 2015-01-10 DIAGNOSIS — Z90710 Acquired absence of both cervix and uterus: Secondary | ICD-10-CM | POA: Diagnosis not present

## 2015-01-10 DIAGNOSIS — Z833 Family history of diabetes mellitus: Secondary | ICD-10-CM | POA: Insufficient documentation

## 2015-01-10 DIAGNOSIS — R102 Pelvic and perineal pain: Secondary | ICD-10-CM | POA: Insufficient documentation

## 2015-01-10 DIAGNOSIS — Z880 Allergy status to penicillin: Secondary | ICD-10-CM | POA: Insufficient documentation

## 2015-01-10 DIAGNOSIS — F319 Bipolar disorder, unspecified: Secondary | ICD-10-CM | POA: Insufficient documentation

## 2015-01-10 DIAGNOSIS — N830 Follicular cyst of ovary: Secondary | ICD-10-CM | POA: Insufficient documentation

## 2015-01-10 DIAGNOSIS — Z8489 Family history of other specified conditions: Secondary | ICD-10-CM | POA: Insufficient documentation

## 2015-01-10 DIAGNOSIS — Z803 Family history of malignant neoplasm of breast: Secondary | ICD-10-CM | POA: Insufficient documentation

## 2015-01-10 DIAGNOSIS — K219 Gastro-esophageal reflux disease without esophagitis: Secondary | ICD-10-CM | POA: Diagnosis not present

## 2015-01-10 DIAGNOSIS — F17200 Nicotine dependence, unspecified, uncomplicated: Secondary | ICD-10-CM | POA: Insufficient documentation

## 2015-01-10 DIAGNOSIS — Z888 Allergy status to other drugs, medicaments and biological substances status: Secondary | ICD-10-CM | POA: Insufficient documentation

## 2015-01-10 DIAGNOSIS — Z8249 Family history of ischemic heart disease and other diseases of the circulatory system: Secondary | ICD-10-CM | POA: Diagnosis not present

## 2015-01-10 HISTORY — PX: LAPAROSCOPY: SHX197

## 2015-01-10 SURGERY — LAPAROSCOPY OPERATIVE
Anesthesia: General | Laterality: Left | Wound class: Clean Contaminated

## 2015-01-10 MED ORDER — FENTANYL CITRATE (PF) 100 MCG/2ML IJ SOLN
25.0000 ug | INTRAMUSCULAR | Status: AC | PRN
  Administered 2015-01-10 (×6): 25 ug via INTRAVENOUS

## 2015-01-10 MED ORDER — ONDANSETRON HCL 4 MG/2ML IJ SOLN
INTRAMUSCULAR | Status: AC
  Administered 2015-01-10: 4 mg via INTRAVENOUS
  Filled 2015-01-10: qty 2

## 2015-01-10 MED ORDER — HYDROMORPHONE HCL 1 MG/ML IJ SOLN
0.5000 mg | INTRAMUSCULAR | Status: DC | PRN
  Administered 2015-01-10 (×2): 0.5 mg via INTRAVENOUS

## 2015-01-10 MED ORDER — PROPOFOL 10 MG/ML IV BOLUS
INTRAVENOUS | Status: DC | PRN
  Administered 2015-01-10: 170 mg via INTRAVENOUS

## 2015-01-10 MED ORDER — FENTANYL CITRATE (PF) 100 MCG/2ML IJ SOLN
INTRAMUSCULAR | Status: AC
  Administered 2015-01-10: 25 ug via INTRAVENOUS
  Filled 2015-01-10: qty 2

## 2015-01-10 MED ORDER — BUPIVACAINE-EPINEPHRINE (PF) 0.5% -1:200000 IJ SOLN
INTRAMUSCULAR | Status: AC
  Filled 2015-01-10: qty 30

## 2015-01-10 MED ORDER — FENTANYL CITRATE (PF) 100 MCG/2ML IJ SOLN
INTRAMUSCULAR | Status: DC | PRN
  Administered 2015-01-10 (×6): 50 ug via INTRAVENOUS

## 2015-01-10 MED ORDER — NEOSTIGMINE METHYLSULFATE 10 MG/10ML IV SOLN
INTRAVENOUS | Status: DC | PRN
  Administered 2015-01-10: 5 mg via INTRAVENOUS

## 2015-01-10 MED ORDER — SUCCINYLCHOLINE CHLORIDE 20 MG/ML IJ SOLN: INTRAMUSCULAR | Status: DC | PRN

## 2015-01-10 MED ORDER — HYDROMORPHONE HCL 1 MG/ML IJ SOLN
INTRAMUSCULAR | Status: AC
  Administered 2015-01-10: 0.5 mg via INTRAVENOUS
  Filled 2015-01-10: qty 1

## 2015-01-10 MED ORDER — FAMOTIDINE 20 MG PO TABS
20.0000 mg | ORAL_TABLET | Freq: Once | ORAL | Status: AC
  Administered 2015-01-10: 20 mg via ORAL

## 2015-01-10 MED ORDER — ROCURONIUM BROMIDE 100 MG/10ML IV SOLN
INTRAVENOUS | Status: DC | PRN
  Administered 2015-01-10: 35 mg via INTRAVENOUS
  Administered 2015-01-10: 15 mg via INTRAVENOUS
  Administered 2015-01-10: 10 mg via INTRAVENOUS

## 2015-01-10 MED ORDER — OXYCODONE-ACETAMINOPHEN 5-325 MG PO TABS: 1.0000 | ORAL_TABLET | ORAL | Status: DC | PRN

## 2015-01-10 MED ORDER — ONDANSETRON HCL 4 MG/2ML IJ SOLN
4.0000 mg | Freq: Once | INTRAMUSCULAR | Status: AC | PRN
  Administered 2015-01-10: 4 mg via INTRAVENOUS

## 2015-01-10 MED ORDER — FLUORESCEIN SODIUM 10 % IJ SOLN
INTRAMUSCULAR | Status: AC
  Filled 2015-01-10: qty 5

## 2015-01-10 MED ORDER — ACETAMINOPHEN 10 MG/ML IV SOLN
INTRAVENOUS | Status: AC
  Filled 2015-01-10: qty 100

## 2015-01-10 MED ORDER — ACETAMINOPHEN 10 MG/ML IV SOLN
INTRAVENOUS | Status: DC | PRN
  Administered 2015-01-10: 1000 mg via INTRAVENOUS

## 2015-01-10 MED ORDER — MIDAZOLAM HCL 2 MG/2ML IJ SOLN
INTRAMUSCULAR | Status: DC | PRN
  Administered 2015-01-10: 2 mg via INTRAVENOUS

## 2015-01-10 MED ORDER — IBUPROFEN 800 MG PO TABS: 800.0000 mg | ORAL_TABLET | Freq: Three times a day (TID) | ORAL | Status: DC

## 2015-01-10 MED ORDER — LACTATED RINGERS IV SOLN
INTRAVENOUS | Status: DC
Start: 2015-01-10 — End: 2015-01-10
  Administered 2015-01-10 (×2): via INTRAVENOUS

## 2015-01-10 MED ORDER — SODIUM CHLORIDE 0.9 % IR SOLN
Status: DC | PRN
  Administered 2015-01-10: 300 mL

## 2015-01-10 MED ORDER — GLYCOPYRROLATE 0.2 MG/ML IJ SOLN
INTRAMUSCULAR | Status: DC | PRN
  Administered 2015-01-10: .8 mg via INTRAVENOUS

## 2015-01-10 MED ORDER — DOCUSATE SODIUM 100 MG PO CAPS: 100.0000 mg | ORAL_CAPSULE | Freq: Two times a day (BID) | ORAL | Status: DC

## 2015-01-10 MED ORDER — FAMOTIDINE 20 MG PO TABS
ORAL_TABLET | ORAL | Status: AC
Start: 2015-01-10 — End: 2015-01-10
  Administered 2015-01-10: 20 mg via ORAL
  Filled 2015-01-10: qty 1

## 2015-01-10 SURGICAL SUPPLY — 30 items
BLADE SURG SZ11 CARB STEEL (BLADE) ×3 IMPLANT
BNDG ADH 2 X3.75 FABRIC TAN LF (GAUZE/BANDAGES/DRESSINGS) ×9 IMPLANT
CANISTER SUCT 1200ML W/VALVE (MISCELLANEOUS) ×3 IMPLANT
CATH ROBINSON RED A/P 16FR (CATHETERS) ×3 IMPLANT
CHLORAPREP W/TINT 26ML (MISCELLANEOUS) ×3 IMPLANT
GLOVE BIO SURGEON STRL SZ8 (GLOVE) ×6 IMPLANT
GLOVE INDICATOR 8.0 STRL GRN (GLOVE) ×3 IMPLANT
GOWN STRL REUS W/ TWL LRG LVL3 (GOWN DISPOSABLE) ×1 IMPLANT
GOWN STRL REUS W/ TWL XL LVL3 (GOWN DISPOSABLE) ×1 IMPLANT
GOWN STRL REUS W/TWL LRG LVL3 (GOWN DISPOSABLE) ×2
GOWN STRL REUS W/TWL XL LVL3 (GOWN DISPOSABLE) ×2
IRRIGATION STRYKERFLOW (MISCELLANEOUS) ×1 IMPLANT
IRRIGATOR STRYKERFLOW (MISCELLANEOUS) ×3
IV LACTATED RINGERS 1000ML (IV SOLUTION) ×3 IMPLANT
KIT RM TURNOVER CYSTO AR (KITS) ×3 IMPLANT
LABEL OR SOLS (LABEL) IMPLANT
NS IRRIG 1000ML POUR BTL (IV SOLUTION) IMPLANT
NS IRRIG 500ML POUR BTL (IV SOLUTION) ×3 IMPLANT
PACK GYN LAPAROSCOPIC (MISCELLANEOUS) ×3 IMPLANT
PAD OB MATERNITY 4.3X12.25 (PERSONAL CARE ITEMS) ×3 IMPLANT
PAD PREP 24X41 OB/GYN DISP (PERSONAL CARE ITEMS) ×3 IMPLANT
POUCH ENDO CATCH 10MM SPEC (MISCELLANEOUS) IMPLANT
SCISSORS METZENBAUM CVD 33 (INSTRUMENTS) IMPLANT
SHEARS HARMONIC ACE PLUS 36CM (ENDOMECHANICALS) ×3 IMPLANT
SLEEVE ENDOPATH XCEL 5M (ENDOMECHANICALS) ×9 IMPLANT
SUT PLAIN 4 0 FS 2 27 (SUTURE) IMPLANT
SUT VIC AB 0 CT2 27 (SUTURE) ×6 IMPLANT
SUT VIC AB 0 UR5 27 (SUTURE) IMPLANT
TROCAR XCEL NON-BLD 5MMX100MML (ENDOMECHANICALS) ×3 IMPLANT
TUBING INSUFFLATOR HI FLOW (MISCELLANEOUS) ×3 IMPLANT

## 2015-01-10 NOTE — Anesthesia Preprocedure Evaluation (Addendum)
Anesthesia Evaluation  Patient identified by MRN, date of birth, ID band Patient awake    Reviewed: Allergy & Precautions, NPO status , Patient's Chart, lab work & pertinent test results, reviewed documented beta blocker date and time   Airway Mallampati: II  TM Distance: >3 FB     Dental  (+) Chipped   Pulmonary Current Smoker,          Cardiovascular     Neuro/Psych PSYCHIATRIC DISORDERS Anxiety Bipolar Disorder  Neuromuscular disease    GI/Hepatic GERD-  ,  Endo/Other    Renal/GU Renal InsufficiencyRenal disease     Musculoskeletal  (+) Arthritis -, Fibromyalgia -  Abdominal   Peds  Hematology   Anesthesia Other Findings Does not take prednisone. Hx of acute bronchitis.  Reproductive/Obstetrics                            Anesthesia Physical Anesthesia Plan  ASA: II  Anesthesia Plan: General   Post-op Pain Management:    Induction: Intravenous  Airway Management Planned: Oral ETT  Additional Equipment:   Intra-op Plan:   Post-operative Plan:   Informed Consent: I have reviewed the patients History and Physical, chart, labs and discussed the procedure including the risks, benefits and alternatives for the proposed anesthesia with the patient or authorized representative who has indicated his/her understanding and acceptance.     Plan Discussed with: CRNA  Anesthesia Plan Comments:         Anesthesia Quick Evaluation

## 2015-01-10 NOTE — Discharge Instructions (Signed)
AMBULATORY SURGERY  °DISCHARGE INSTRUCTIONS ° ° °1) The drugs that you were given will stay in your system until tomorrow so for the next 24 hours you should not: ° °A) Drive an automobile °B) Make any legal decisions °C) Drink any alcoholic beverage ° ° °2) You may resume regular meals tomorrow.  Today it is better to start with liquids and gradually work up to solid foods. ° °You may eat anything you prefer, but it is better to start with liquids, then soup and crackers, and gradually work up to solid foods. ° ° °3) Please notify your doctor immediately if you have any unusual bleeding, trouble breathing, redness and pain at the surgery site, drainage, fever, or pain not relieved by medication. ° ° ° °4) Additional Instructions: ° ° ° ° ° ° ° °Please contact your physician with any problems or Same Day Surgery at [phone], Monday through Friday 6 am to 4 pm, or [place] [facility] [place] Main number at [phone].AMBULATORY SURGERY  °DISCHARGE INSTRUCTIONS ° ° °5) The drugs that you were given will stay in your system until tomorrow so for the next 24 hours you should not: ° °D) Drive an automobile °E) Make any legal decisions °F) Drink any alcoholic beverage ° ° °6) You may resume regular meals tomorrow.  Today it is better to start with liquids and gradually work up to solid foods. ° °You may eat anything you prefer, but it is better to start with liquids, then soup and crackers, and gradually work up to solid foods. ° ° °7) Please notify your doctor immediately if you have any unusual bleeding, trouble breathing, redness and pain at the surgery site, drainage, fever, or pain not relieved by medication. ° ° ° °8) Additional Instructions: ° ° ° ° ° ° ° °Please contact your physician with any problems or Same Day Surgery at [phone], Monday through Friday 6 am to 4 pm, or [place] [facility] [place] Main number at [phone]. °

## 2015-01-10 NOTE — H&P (Signed)
Herold Harms, MD at 01/05/2015 9:06 AM     Status: Addendum       Expand All Collapse All   Patient ID: Casey Hodges, female DOB: 1975/11/18, 39 y.o. MRN: [id number] Pre-op appt- lap with bx on 01/10/2015  Subjective:    Patient is a 39 y.o. G1P1027female scheduled for laparoscopy with biopsies. Indications for procedure are chronic pelvic pain  Patient is status post LAVH, bilateral salpingectomy in 2015. She continues to experience pelvic pain/dyspareunia. Pain is also associated with defecation. Sharp stabbing type component is noted intermittently.Marland Kitchen Pelvic ultrasound 12/01/2014 was normal. Review of pathology from hysterectomy demonstrates small fibroid without other abnormalities. No evidence of endometriosis/adenomyosis.  Pertinent Gynecological History: Menses: status post hysterectomy Bleeding: none Contraception: status post hysterectomy Last mammogram: NA Date: NA Last pap: NA Date: NA  Discussed Blood/Blood Products: yes  Menstrual History: OB History    Gravida Para Term Preterm AB TAB SAB Ectopic Multiple Living   1 1 1       1       Menarche age:   na  Past Medical History  Diagnosis Date  . Bipolar affective     pt reported  . Uterine polyp   . Eating disorder     Under control per patient  . Renal disorder   . Painful menstrual periods   . Abnormal vaginal Pap smear     10+ years ago- no colpo repeat was normal  . Painful intercourse   . GERD (gastroesophageal reflux disease)   . VWD (acquired von Willebrand's disease)   . Anxiety   . Spinal stenosis   . Kidney stone   . Bipolar disorder   . AR (allergic rhinitis)   . Pelvic pain in female   . Fibromyalgia     Past Surgical History  Procedure Laterality Date  . Tubal ligation    . Laparoscopy abdomen diagnostic    . Hysteroscopy      removed polyps  .  Laparoscopic vaginal hysterectomy  2015    at [facility]    OB History  Gravida Para Term Preterm AB SAB TAB Ectopic Multiple Living  1 1 1       1     # Outcome Date GA Lbr Len/2nd Weight Sex Delivery Anes PTL Lv  1 Term 1999   7 lb 9 oz (3.43 kg) F Vag-Spont   Y      History   Social History  . Marital Status: Widowed    Spouse Name: N/A  . Number of Children: 1  . Years of Education: N/A   Occupational History  . Un-employed due to bipolar    Social History Main Topics  . Smoking status: Current Every Day Smoker -- 1.00 packs/day  . Smokeless tobacco: Never Used  . Alcohol Use: No     Comment: Occasional  . Drug Use: No  . Sexual Activity: No   Other Topics Concern  . None   Social History Narrative   Regular Exercise - NO   Daily Caffeine Use: 1 cup coffee in am      1 child, one step child          Family History  Problem Relation Age of Onset  . Breast cancer Paternal Grandmother   . Diabetes Maternal Grandmother   . Hypertension Father   . Sleep apnea Mother   . Diabetes Maternal Aunt   . Diabetes Maternal Uncle      (Not in a hospital admission)  Allergies  Allergen Reactions  . Wellbutrin [Bupropion Hcl] Other (See Comments)    Mental breakdown? Suicidal   . Penicillins Other (See Comments)    Yeast infections  . Lasix [Furosemide] Rash  . Nitrofurantoin Monohyd Macro Rash    Review of Systems Constitutional: No recent fever/chills/sweats Respiratory: No recent cough/bronchitis Cardiovascular: No chest pain Gastrointestinal: No recent nausea/vomiting/diarrhea Genitourinary: No UTI symptoms Hematologic/lymphatic: No recent blood thinner use  History of coagulopathy-von Willebrand's disease; uses DDAVP prior to surgery  Objective:    BP 122/75 mmHg  Pulse 92   Ht 5\' 2"  (1.575 m)  Wt 143 lb 3.2 oz (64.955 kg)  BMI 26.18 kg/m2  LMP 10/29/2013  General:  Normal  Skin:  normal  HEENT: Normal  Neck: Supple without Adenopathy or Thyromegaly  Lungs:   Heart:    Breasts:   Abdomen:  Pelvis:  M/S   Extremeties:  Neuro:   clear to auscultation bilaterally  Normal without murmur  Not Examined  soft, non-tender; bowel sounds normal; no masses, no organomegaly  Exam deferred to OR  No CVAT  Warm/Dry   Normal          Assessment:  1. Chronic pelvic pain. 2. Status post LAVH, bilateral salpingectomy-2015  Plan:   1. Laparoscopy with peritoneal biopsies.    Counseling: Patient is to undergo laparoscopy with peritoneal biopsies for evaluation of chronic pelvic pain. She is understanding of the planned procedure and is aware of and is accepting of all surgical risks which include but are not limited to bleeding, infection, pelvic organ injury, need for repair, blood clots disorders, anesthesia risks, etc. The patient does understand that if pathology is identified in the adnexal regions, possible oophorectomy may be indicated. She has accepting of this. All questions have been answered. Informed consent is given. Patient is ready and willing to proceed with surgery as scheduled  Date of Initial H&P: 01/05/2015  History reviewed, patient examined, no change in status, stable for surgery.         Herold HarmsMartin A Ambra Haverstick, MD at 01/05/2015 9:06 AM     Status: Addendum       Expand All Collapse All   Patient ID: Casey CadetKena C Hodges, female DOB: 11/23/1975, 39 y.o. MRN: [id number] Pre-op appt- lap with bx on 01/10/2015  Subjective:    Patient is a 39 y.o. G1P105901female scheduled for laparoscopy with biopsies. Indications for procedure are chronic pelvic pain  Patient is status post LAVH, bilateral salpingectomy in 2015. She continues to experience pelvic pain/dyspareunia.  Pain is also associated with defecation. Sharp stabbing type component is noted intermittently.Marland Kitchen. Pelvic ultrasound 12/01/2014 was normal. Review of pathology from hysterectomy demonstrates small fibroid without other abnormalities. No evidence of endometriosis/adenomyosis.  Pertinent Gynecological History: Menses: status post hysterectomy Bleeding: none Contraception: status post hysterectomy Last mammogram: NA Date: NA Last pap: NA Date: NA  Discussed Blood/Blood Products: yes  Menstrual History: OB History    Gravida Para Term Preterm AB TAB SAB Ectopic Multiple Living   1 1 1       1       Menarche age:   na  Past Medical History  Diagnosis Date  . Bipolar affective     pt reported  . Uterine polyp   . Eating disorder     Under control per patient  . Renal disorder   . Painful menstrual periods   . Abnormal vaginal Pap smear     10+ years ago- no colpo repeat was normal  .  Painful intercourse   . GERD (gastroesophageal reflux disease)   . VWD (acquired von Willebrand's disease)   . Anxiety   . Spinal stenosis   . Kidney stone   . Bipolar disorder   . AR (allergic rhinitis)   . Pelvic pain in female   . Fibromyalgia     Past Surgical History  Procedure Laterality Date  . Tubal ligation    . Laparoscopy abdomen diagnostic    . Hysteroscopy      removed polyps  . Laparoscopic vaginal hysterectomy  2015    at [facility]    OB History  Gravida Para Term Preterm AB SAB TAB Ectopic Multiple Living  # Outcome Date GA Lbr Len/2nd Weight Sex Delivery Anes PTL Lv  1 Term 1999   7 lb 9 oz (3.43 kg) F Vag-Spont   Y      History   Social History  . Marital Status: Widowed    Spouse Name: N/A  . Number of Children: 1  . Years of Education: N/A    Occupational History  . Un-employed due to bipolar    Social History Main Topics  . Smoking status: Current Every Day Smoker -- 1.00 packs/day  . Smokeless tobacco: Never Used  . Alcohol Use: No     Comment: Occasional  . Drug Use: No  . Sexual Activity: No   Other Topics Concern  . None   Social History Narrative   Regular Exercise - NO   Daily Caffeine Use: 1 cup coffee in am      1 child, one step child          Family History  Problem Relation Age of Onset  . Breast cancer Paternal Grandmother   . Diabetes Maternal Grandmother   . Hypertension Father   . Sleep apnea Mother   . Diabetes Maternal Aunt   . Diabetes Maternal Uncle      (Not in a hospital admission)  Allergies  Allergen Reactions  . Wellbutrin [Bupropion Hcl] Other (See Comments)    Mental breakdown? Suicidal   . Penicillins Other (See Comments)    Yeast infections  . Lasix [Furosemide] Rash  . Nitrofurantoin Monohyd Macro Rash    Review of Systems Constitutional: No recent fever/chills/sweats Respiratory: No recent cough/bronchitis Cardiovascular: No chest pain Gastrointestinal: No recent nausea/vomiting/diarrhea Genitourinary: No UTI symptoms Hematologic/lymphatic: No recent blood thinner use  History of coagulopathy-von Willebrand's disease; uses DDAVP prior to surgery  Objective:    BP 122/75 mmHg  Pulse 92  Ht  (1.575 m)  Wt 143 lb 3.2 oz (64.955 kg)  BMI 26.18 kg/m2  LMP 10/29/2013  General:  Normal  Skin:  normal  HEENT: Normal  Neck: Supple without Adenopathy or Thyromegaly  Lungs:   Heart:    Breasts:   Abdomen:  Pelvis:  M/S   Extremeties:  Neuro:   clear to auscultation bilaterally  Normal without murmur  Not Examined  soft, non-tender; bowel sounds normal; no masses, no organomegaly  Exam deferred to OR   No CVAT  Warm/Dry   Normal          Assessment:  1. Chronic pelvic pain. 2. Status post LAVH, bilateral salpingectomy-2015  Plan:   1. Laparoscopy with peritoneal biopsies.    Counseling: Patient is to undergo laparoscopy with peritoneal biopsies for evaluation of chronic pelvic pain. She is understanding of the planned procedure and is aware  of and is accepting of all surgical risks which include but are not limited to bleeding, infection, pelvic organ injury, need for repair, blood clots disorders, anesthesia risks, etc. The patient does understand that if pathology is identified in the adnexal regions, possible oophorectomy may be indicated. She has accepting of this. All questions have been answered. Informed consent is given. Patient is ready and willing to proceed with surgery as scheduled  Date of Initial H&P: 01/05/2015  History reviewed, patient examined, no change in status, stable for surgery.

## 2015-01-10 NOTE — Op Note (Signed)
Casey CadetKena C Hodges PROCEDURE DATE: 01/10/2015 9:25 AM  PREOPERATIVE DIAGNOSIS: CHRONIC PELVIC PAIN POSTOPERATIVE DIAGNOSIS: CHRONIC PELVIC PAIN; PAD PROCEDURE: Procedure(s): LAPAROSCOPY OPERATIVE with biopsy, left oopherectomy (Left),Adhesiolysis, cystoscopy SURGEON:  Dr. Daphine DeutscherMartin A Phoenix Dresser ASSISTANT: Dr. Valentino Saxonherry ANESTHESIA: General Endotracheal  INDICATIONS: 39 y.o. G1P1001 with history of chronic pelvic pain /dyspareunia, status post LAVH bilateral salpingectomy, presents for surgical evaluation and  FINDINGS:  Extensive bowel, left pelvic sidewall and left adnexal adhesions extending the pelvic brim to the cul-de-sac. 30 minutes of adhesiolysis had performed in order to restore somewhat normal anatomy. Right ovary was normal in appearance. There were white lesions on the vaginal cuff(; these were biopsied. Cystoscopy following the laparoscopy verified patent ureters bilaterally. I/O's: Total I/O In: 600 [I.V.:600] Out: 30 [Urine:30] SPECIMENS: Peritoneal biopsies , left ovary COMPLICATIONS: None immediate COUNTS:  YES  PROCEDURE IN DETAIL: The patient was brought to the operating room where she was placed in the supine position.  General endotracheal anesthesia was induced without difficulty.  She was placed in the dorsal lithotomy position using bumblebee stirrups.  A ChloraPrep and Betadine, abdominal, perineal, intravaginal prep and drape was performed in standard fashion.  The timeout was completed.  The red Robison catheter was used to drain the bladder of clear urine. A sponge stick was placed into the vagina to help facilitate anatomic orientation. Laparoscopy was performed in standard fashion.  The Optiview 5 mm trocar and sleeve were placed through a 5 mm subumbilical incision directly into the abdominal pelvic cavity, without evidence of bowel or vascular injury. 2 Additional 5 mm ports were placed in the lower quadrants, respectively, under direct visualization.  The above noted  findings were photo documented. The vaginal cuff was biopsied with biopsy forceps. White lesions suspicious for endometriosis were sampled. This was followed by adhesiolysis in the left pelvic sidewall/adnexa/bowel region in order to restore normal anatomy,using the Ace Harmonic Scalpel. Ultimately the ovary was mobilized and the infundibulopelvic ligament was isolated after which the Ace Harmonic scalpel was used for resection of the ovary. An 11 mm port was placed in the right lower quadrant replacing the 5 mm port site. The ovary was removed from this port. Copious irrigation of the pelvis was performed and the irrigant fluid was aspirated.  The pneumoperitoneum was released. Incisions were closed with 0 Vicryl suture on the 11 mm port site; 4-0 Vicryl suture was used in a simple interrupted manner to close the  5 mm port incisions Telfa and op site dressing was applied. Cystoscopy was then performed with the 30 scope. The ureters were noted to be patent with efflux of fluorescein dye which had been given intravenously, 0.5 cc. Bladder was drained. Procedure was terminated with the patient being extubated and taken to recovery room in satisfactory condition.  Herold HarmsMartin A Dashiel Bergquist, MD [facility]

## 2015-01-10 NOTE — Anesthesia Procedure Notes (Signed)
Procedure Name: Intubation Date/Time: 01/10/2015 7:45 AM Performed by: Henrietta HooverPOPE, Danyela Posas Pre-anesthesia Checklist: Patient identified, Emergency Drugs available, Suction available, Patient being monitored and Timeout performed Patient Re-evaluated:Patient Re-evaluated prior to inductionOxygen Delivery Method: Circle system utilized Preoxygenation: Pre-oxygenation with 100% oxygen Intubation Type: IV induction Ventilation: Mask ventilation without difficulty Laryngoscope Size: Mac and 3 Grade View: Grade II Tube type: Oral Number of attempts: 2 Airway Equipment and Method: Stylet Placement Confirmation: ETT inserted through vocal cords under direct vision,  positive ETCO2 and breath sounds checked- equal and bilateral Secured at: 22 cm Tube secured with: Tape Comments: First attempt by Ledell PeoplesKim Wyatte Dames, CRNA #7.0 ETT without stylet.  Larynx anterior, could see arytenoids and epiglottis.  ETT into esophagus.  Added stylet, Dr Maisie Fushomas for second attempt.  Patient remained saturated VSS .  Dr Maisie Fushomas used Mac 3 also with stylet and was successful. + ETCO2 BBSE

## 2015-01-10 NOTE — Transfer of Care (Signed)
Immediate Anesthesia Transfer of Care Note  Patient: Casey CadetKena C Boss  Procedure(s) Performed: Procedure(s): LAPAROSCOPY OPERATIVE with biopsy, left oopherectomy (Left)  Patient Location: PACU  Anesthesia Type:General  Level of Consciousness: awake  Airway & Oxygen Therapy:   Post-op Assessment: Report given to RN and Post -op Vital signs reviewed and stable  Post vital signs: Reviewed and stable  Last Vitals:  Filed Vitals:   01/10/15 0942  BP: 135/99  Pulse: 100  Temp: 36.3 C  Resp: 19    Complications: No apparent anesthesia complications

## 2015-01-11 ENCOUNTER — Telehealth: Payer: Self-pay | Admitting: Obstetrics and Gynecology

## 2015-01-11 NOTE — Anesthesia Postprocedure Evaluation (Signed)
  Anesthesia Post-op Note  Patient: Casey Hodges  Procedure(s) Performed: Procedure(s): LAPAROSCOPY OPERATIVE with biopsy, left oopherectomy (Left)  Anesthesia type:General  Patient location: PACU  Post pain: Pain level controlled  Post assessment: Post-op Vital signs reviewed, Patient's Cardiovascular Status Stable, Respiratory Function Stable, Patent Airway and No signs of Nausea or vomiting  Post vital signs: Reviewed and stable  Last Vitals:  Filed Vitals:   01/10/15 1118  BP: 118/75  Pulse: 74  Temp:   Resp:     Level of consciousness: awake, alert  and patient cooperative  Complications: No apparent anesthesia complications

## 2015-01-11 NOTE — Telephone Encounter (Signed)
Patient had surgery yesterday. She state her pain level is a 10 on scale 1-10 and has no relief with oxycodone and  motrin. She is crying she is in so much pain. Please Advise

## 2015-01-12 ENCOUNTER — Ambulatory Visit (INDEPENDENT_AMBULATORY_CARE_PROVIDER_SITE_OTHER): Payer: Medicaid Other | Admitting: Family Medicine

## 2015-01-12 ENCOUNTER — Encounter: Payer: Self-pay | Admitting: Family Medicine

## 2015-01-12 VITALS — BP 124/73 | HR 118 | Temp 98.1°F | Resp 18 | Ht 62.0 in | Wt 143.0 lb

## 2015-01-12 DIAGNOSIS — M797 Fibromyalgia: Secondary | ICD-10-CM

## 2015-01-12 DIAGNOSIS — F3176 Bipolar disorder, in full remission, most recent episode depressed: Secondary | ICD-10-CM | POA: Diagnosis not present

## 2015-01-12 DIAGNOSIS — R112 Nausea with vomiting, unspecified: Secondary | ICD-10-CM

## 2015-01-12 DIAGNOSIS — N949 Unspecified condition associated with female genital organs and menstrual cycle: Secondary | ICD-10-CM

## 2015-01-12 DIAGNOSIS — T50905A Adverse effect of unspecified drugs, medicaments and biological substances, initial encounter: Secondary | ICD-10-CM

## 2015-01-12 DIAGNOSIS — G8929 Other chronic pain: Secondary | ICD-10-CM | POA: Diagnosis not present

## 2015-01-12 DIAGNOSIS — R102 Pelvic and perineal pain: Secondary | ICD-10-CM

## 2015-01-12 LAB — SURGICAL PATHOLOGY

## 2015-01-12 MED ORDER — LYRICA 150 MG PO CAPS: 150.0000 mg | ORAL_CAPSULE | Freq: Two times a day (BID) | ORAL | Status: DC

## 2015-01-12 MED ORDER — OXYCODONE-ACETAMINOPHEN 10-325 MG PO TABS: 1.0000 | ORAL_TABLET | ORAL | Status: DC | PRN

## 2015-01-12 MED ORDER — PROMETHAZINE HCL 25 MG PO TABS: 25.0000 mg | ORAL_TABLET | Freq: Every day | ORAL | Status: DC | PRN

## 2015-01-12 MED ORDER — CLONAZEPAM 0.5 MG PO TABS: 0.5000 mg | ORAL_TABLET | Freq: Two times a day (BID) | ORAL | Status: DC | PRN

## 2015-01-12 MED ORDER — OXYCODONE-ACETAMINOPHEN 10-325 MG PO TABS: 1.0000 | ORAL_TABLET | Freq: Three times a day (TID) | ORAL | Status: DC | PRN

## 2015-01-12 NOTE — Progress Notes (Signed)
Name: Casey Hodges   MRN: [id number]    DOB: 1975/07/13   Date:01/12/2015       Progress Note  Subjective  Chief Complaint  Chief Complaint  Patient presents with  . Follow-up    4 wk. for Fibromyalgia  . Referral    Therapist/ for Bipolar Disorder    Anxiety Presents for follow-up visit. Symptoms include depressed mood, insomnia, irritability, malaise, nausea, nervous/anxious behavior and panic. The severity of symptoms is severe and causing significant distress.   Past treatments include benzodiazephines. Compliance with prior treatments has been good.   Pt. is here for Fibromyalgia follow up. She has generalized body aches, most commonly in her lower back, abdomen and pelvic area (recently underwent laparoscopic pelvic surgeyr and had her left ovary removed).She is on Percocet 7.5-325 mg 1 tablet every 8 hours as needed, which is not not controlling her pain. She was given Percoet 5 mg after her surgery and at the time of discharge and she is taking two at a time to relieve her pain. Pelvic pain is rated at 10/10 and chronic pain from Fibromyalgia is rated at 6-7/10. She is on Lyrica in addition to the opioid therapy for fibromyalgia. She also states that she had a tick bite 2 weeks ago and initially felt headaches, weakness, nausea, and chills but all of those symptoms have resolved since her pelvic surgery.  Past Medical History  Diagnosis Date  . Bipolar affective     pt reported  . Uterine polyp   . Eating disorder     Under control per patient  . Renal disorder   . Painful menstrual periods   . Abnormal vaginal Pap smear     10+ years ago- no colpo repeat was normal  . Painful intercourse   . GERD (gastroesophageal reflux disease)   . VWD (acquired von Willebrand's disease)   . Anxiety   . Spinal stenosis   . Kidney stone   . Bipolar disorder   . AR (allergic rhinitis)   . Pelvic pain in female   . Fibromyalgia     Past Surgical History  Procedure Laterality Date   . Tubal ligation    . Laparoscopy abdomen diagnostic    . Hysteroscopy      removed polyps  . Laparoscopic vaginal hysterectomy  2015    at [facility]  . Abdominal hysterectomy    . Laparoscopy Left 01/10/2015    Procedure: LAPAROSCOPY OPERATIVE with biopsy, left oopherectomy;  Surgeon: Herold Harms, MD;  Location: [facility] ORS;  Service: Gynecology;  Laterality: Left;    Family History  Problem Relation Age of Onset  . Breast cancer Paternal Grandmother   . Diabetes Maternal Grandmother   . Hypertension Father   . Sleep apnea Mother   . Diabetes Maternal Aunt   . Diabetes Maternal Uncle     History   Social History  . Marital Status: Widowed    Spouse Name: N/A  . Number of Children: 1  . Years of Education: N/A   Occupational History  . Un-employed due to bipolar    Social History Main Topics  . Smoking status: Current Every Day Smoker -- 1.00 packs/day  . Smokeless tobacco: Never Used  . Alcohol Use: No     Comment: Occasional  . Drug Use: No  . Sexual Activity: No   Other Topics Concern  . Not on file   Social History Narrative   Regular Exercise -  NO   Daily Caffeine  Use:  1 cup coffee in am      1 child, one step child           Current outpatient prescriptions:  .  Biotin w/ Vitamins C & E (HAIR SKIN & NAILS GUMMIES) 1250-7.5-7.5 MCG-MG-UNT CHEW, Chew 1 each by mouth daily., Disp: , Rfl:  .  clonazePAM (KLONOPIN) 0.5 MG tablet, Take 1 tablet (0.5 mg total) by mouth 2 (two) times daily as needed for anxiety., Disp: 60 tablet, Rfl: 0 .  desmopressin (DDAVP NASAL) 0.01 % solution, , Disp: , Rfl:  .  docusate sodium (COLACE) 100 MG capsule, Take 1 capsule (100 mg total) by mouth 2 (two) times daily., Disp: 60 capsule, Rfl: 0 .  ibuprofen (ADVIL,MOTRIN) 800 MG tablet, Take 1 tablet (800 mg total) by mouth 3 (three) times daily., Disp: 30 tablet, Rfl: 1 .  LYRICA 150 MG capsule, Take 150 mg by mouth 2 (two) times daily., Disp: , Rfl: 0 .  Melatonin 5  MG TABS, Take 20 mg by mouth at bedtime. , Disp: , Rfl:  .  Multiple Vitamins-Minerals (MULTIVITAMIN GUMMIES WOMENS) CHEW, Chew 3 each by mouth daily. , Disp: , Rfl:  .  oxyCODONE-acetaminophen (PERCOCET) 7.5-325 MG per tablet, TAKE 1 TABLET EVERY 8 HOURS AS NEEDED FOR SEVERE PAIN, Disp: , Rfl: 0  Allergies  Allergen Reactions  . Wellbutrin [Bupropion Hcl] Other (See Comments)    Mental breakdown? Suicidal   . Penicillins Other (See Comments)    Yeast infections  . Lasix [Furosemide] Rash  . Nitrofurantoin Monohyd Macro Rash     Review of Systems  Constitutional: Positive for irritability. Negative for fever, chills, weight loss and malaise/fatigue.  Gastrointestinal: Positive for nausea and abdominal pain.  Musculoskeletal: Positive for back pain, joint pain and neck pain.  Neurological: Negative for headaches.  Psychiatric/Behavioral: Positive for depression. The patient is nervous/anxious and has insomnia.       Objective  Filed Vitals:   01/12/15 1335  BP: 124/73  Pulse: 118  Temp: 98.1 F (36.7 C)  TempSrc: Oral  Resp: 18  Height: 5\' 2"  (1.575 m)  Weight: 143 lb (64.864 kg)  SpO2: 96%    Physical Exam  Constitutional: She is oriented to person, place, and time and well-developed, well-nourished, and in no distress.  HENT:  Head: Normocephalic and atraumatic.  Cardiovascular: Normal rate and regular rhythm.   Pulmonary/Chest: Effort normal and breath sounds normal.  Abdominal: Soft. There is tenderness (recent pelvic surgery).  Generalized abdominal tenderness to palpation.  Musculoskeletal:       Cervical back: She exhibits pain. She exhibits no tenderness.       Lumbar back: She exhibits tenderness and pain.  Neurological: She is alert and oriented to person, place, and time.  Skin: Skin is warm and dry.  Psychiatric: Affect normal.  Nursing note and vitals reviewed.     Assessment & Plan 1. Fibromyalgia Increased Percocet to 10 mg to help with her  worsening Fibromyalgia and post-op pain. She will follow up in 1 month. - oxyCODONE-acetaminophen (PERCOCET) 10-325 MG per tablet; Take 1 tablet by mouth every 8 (eight) hours as needed for pain.  Dispense: 30 tablet; Refill: 0 - LYRICA 150 MG capsule; Take 1 capsule (150 mg total) by mouth 2 (two) times daily.  Dispense: 60 capsule; Refill: 0  2. Chronic pelvic pain in female Being followed by Gynecology.  3. Bipolar disorder, in full remission, most recent episode depressed Pt. Has been referred to psychiatry but  has not obtained an appointment with [facility]. Continue Clonazepam and follow up in 1 month. - clonazePAM (KLONOPIN) 0.5 MG tablet; Take 1 tablet (0.5 mg total) by mouth 2 (two) times daily as needed for anxiety.  Dispense: 60 tablet; Refill: 0  4. Drug-induced nausea and vomiting  - promethazine (PHENERGAN) 25 MG tablet; Take 1 tablet (25 mg total) by mouth daily as needed for nausea or vomiting.  Dispense: 30 tablet; Refill: 0    Caedan Sumler Asad A[facility] 01/12/2015 1:56 PM

## 2015-01-12 NOTE — Telephone Encounter (Signed)
Pt aware. Rx signed and left up front for pick up.

## 2015-01-12 NOTE — Telephone Encounter (Signed)
Pt is 3 d s/p lap with bx and left oophorectomy. She is on percocet 2 q4 ad ibup 800 q8. Pain is still a 8-9. She is very sore. States pain is worse than her hysterectomy. Bladder function normal. NO BM yet. Encouraged to continue with stool softenter. Increase h20. Fresh fruits and veggies.  Pos eating and drinking. NO UTI sx. Will send to mad.

## 2015-01-18 ENCOUNTER — Encounter: Payer: Medicaid Other | Admitting: Obstetrics and Gynecology

## 2015-01-19 ENCOUNTER — Other Ambulatory Visit: Payer: Self-pay | Admitting: Family Medicine

## 2015-01-19 ENCOUNTER — Telehealth: Payer: Self-pay | Admitting: Family Medicine

## 2015-01-19 NOTE — Telephone Encounter (Signed)
Pt states she was here last week and was supposed to get a lab order to check for tick fever and she forgot the lab order. She state she still feels really bad.

## 2015-01-19 NOTE — Telephone Encounter (Signed)
Patient states she needs her lab order for tick bite, routed to Dr. Sherryll Burger to order lab.

## 2015-01-20 ENCOUNTER — Other Ambulatory Visit: Payer: Self-pay | Admitting: Family Medicine

## 2015-01-20 ENCOUNTER — Ambulatory Visit (INDEPENDENT_AMBULATORY_CARE_PROVIDER_SITE_OTHER): Payer: Medicaid Other | Admitting: Obstetrics and Gynecology

## 2015-01-20 ENCOUNTER — Encounter: Payer: Self-pay | Admitting: Obstetrics and Gynecology

## 2015-01-20 VITALS — BP 127/77 | HR 104 | Ht 62.0 in | Wt 145.5 lb

## 2015-01-20 DIAGNOSIS — M797 Fibromyalgia: Secondary | ICD-10-CM

## 2015-01-20 DIAGNOSIS — Z09 Encounter for follow-up examination after completed treatment for conditions other than malignant neoplasm: Secondary | ICD-10-CM

## 2015-01-20 DIAGNOSIS — W57XXXA Bitten or stung by nonvenomous insect and other nonvenomous arthropods, initial encounter: Secondary | ICD-10-CM | POA: Insufficient documentation

## 2015-01-20 DIAGNOSIS — N736 Female pelvic peritoneal adhesions (postinfective): Secondary | ICD-10-CM

## 2015-01-20 DIAGNOSIS — G8918 Other acute postprocedural pain: Secondary | ICD-10-CM

## 2015-01-20 DIAGNOSIS — R102 Pelvic and perineal pain: Secondary | ICD-10-CM

## 2015-01-20 MED ORDER — OXYCODONE-ACETAMINOPHEN 10-325 MG PO TABS: 1.0000 | ORAL_TABLET | Freq: Three times a day (TID) | ORAL | Status: DC | PRN

## 2015-01-20 NOTE — Progress Notes (Signed)
Disregard first note about prescription, it is suppose to say patient will pick up lab order from front desk.

## 2015-01-20 NOTE — Progress Notes (Signed)
Patient has been called and asked to come pick up prescription from office along with Photo ID.

## 2015-01-20 NOTE — Progress Notes (Signed)
Patient ID: MACY POLIO, female   DOB: February 18, 1976, 39 y.o.   MRN: [id number]  Chief complaint: 1.  Postop check-10 days. 2.  Status post laparoscopy with adhesiolysis, left oophorectomy, and cystoscopy. 3.  Chronic pelvic pain.    10 day post-op lap with bx and left oophorectomy No fevers No draining/oozing at incision Bowel/bladder- wnl Stabbing pains in vaginal area at time Wants refill percocet  Findings from surgery reviewed. Pathology showed left ovary with ovarian cyst and hemorrhage; vaginal cuff biopsies consistent with fibroadipose tissue and Hemosiderin laden macrophages'  OBJECTIVE: BP 127/77 mmHg  Pulse 104  Ht  (1.575 m)  Wt 145 lb 8 oz (65.998 kg)  BMI 26.61 kg/m2  LMP 10/29/2013 Patient is a pleasant, well-appearing white female in no acute distress. Abdomen-soft, nontender; laparoscopy incisions well approximated; residual sutures still present.  No hernias. Pelvic exam-deferred. Extremities-warm, dry  IMPRESSION: 1.  Normal postop check. 2.  Residual abdominal pelvic discomfort likely secondary to healing; pelvic pain, previously noted is improved.  PLAN: 1.  Refill Percocet 10 mg 2.  Return in 4 weeks for follow-up. 3.  Resume all activities as tolerated.

## 2015-01-21 ENCOUNTER — Other Ambulatory Visit: Payer: Self-pay | Admitting: Family Medicine

## 2015-01-21 DIAGNOSIS — N736 Female pelvic peritoneal adhesions (postinfective): Secondary | ICD-10-CM | POA: Insufficient documentation

## 2015-01-22 LAB — B. BURGDORFI ANTIBODIES: Lyme IgG/IgM Ab: <0.91 {ISR} (ref 0.00–0.90)

## 2015-01-25 NOTE — Progress Notes (Signed)
Test was added on to [facility] for Tickborne Disease and patient notified her Lyme Test was negative.

## 2015-01-27 ENCOUNTER — Other Ambulatory Visit: Payer: Self-pay | Admitting: Family Medicine

## 2015-01-27 LAB — HGE(IGG/M)+LYMEAB(IGM)+RKYIGM
HGE IGM TITER: NEGATIVE
HGE IgG Titer: NEGATIVE

## 2015-01-27 LAB — SPECIMEN STATUS REPORT

## 2015-02-14 ENCOUNTER — Ambulatory Visit (INDEPENDENT_AMBULATORY_CARE_PROVIDER_SITE_OTHER): Payer: Medicaid Other | Admitting: Family Medicine

## 2015-02-14 ENCOUNTER — Encounter: Payer: Self-pay | Admitting: Family Medicine

## 2015-02-14 VITALS — BP 132/78 | HR 90 | Temp 98.7°F | Resp 18 | Ht 62.0 in | Wt 144.8 lb

## 2015-02-14 DIAGNOSIS — F411 Generalized anxiety disorder: Secondary | ICD-10-CM

## 2015-02-14 DIAGNOSIS — R112 Nausea with vomiting, unspecified: Secondary | ICD-10-CM

## 2015-02-14 DIAGNOSIS — M797 Fibromyalgia: Secondary | ICD-10-CM

## 2015-02-14 DIAGNOSIS — L729 Follicular cyst of the skin and subcutaneous tissue, unspecified: Secondary | ICD-10-CM | POA: Diagnosis not present

## 2015-02-14 DIAGNOSIS — T50905A Adverse effect of unspecified drugs, medicaments and biological substances, initial encounter: Secondary | ICD-10-CM

## 2015-02-14 DIAGNOSIS — M8560 Other cyst of bone, unspecified site: Secondary | ICD-10-CM | POA: Insufficient documentation

## 2015-02-14 MED ORDER — PROMETHAZINE HCL 25 MG PO TABS: 25.0000 mg | ORAL_TABLET | Freq: Every day | ORAL | Status: DC | PRN

## 2015-02-14 MED ORDER — CLONAZEPAM 0.5 MG PO TABS: 0.5000 mg | ORAL_TABLET | Freq: Two times a day (BID) | ORAL | Status: DC | PRN

## 2015-02-14 MED ORDER — LYRICA 150 MG PO CAPS: 150.0000 mg | ORAL_CAPSULE | Freq: Two times a day (BID) | ORAL | Status: DC

## 2015-02-14 MED ORDER — OXYCODONE-ACETAMINOPHEN 7.5-325 MG PO TABS: 1.0000 | ORAL_TABLET | Freq: Three times a day (TID) | ORAL | Status: DC | PRN

## 2015-02-14 NOTE — Progress Notes (Signed)
Name: Casey Hodges   MRN: [id number]    DOB: 02/08/1976   Date:02/14/2015       Progress Note  Subjective  Chief Complaint  Chief Complaint  Patient presents with  . Follow-up    1 mo.  . Manic Behavior  . Anxiety    Anxiety Presents for follow-up visit. Symptoms include depressed mood, excessive worry, insomnia, irritability, panic and shortness of breath. Patient reports no chest pain or palpitations.   Past treatments include benzodiazephines (tried Buspar by psychiatry and did not seem effective, so stopped taking it.). The treatment provided significant relief. Compliance with prior treatments has been good.  Fibromyalgia Pt. Is here for follow up of Fibromyalgia. This includes symptoms of generalized aches and pains (low back pain, knee pain, bilateral hip pain, depressed mood). She is on Percocet 10-325 mg three times daily and Lyrica 150 mg twice daily. This seems to be helping with her pain. Pt. Would like to go back down to Percocet 7.5-325 mg three times daily dosage.   Past Medical History  Diagnosis Date  . Bipolar affective     pt reported  . Uterine polyp   . Eating disorder     Under control per patient  . Renal disorder   . Painful menstrual periods   . Abnormal vaginal Pap smear     10+ years ago- no colpo repeat was normal  . Painful intercourse   . GERD (gastroesophageal reflux disease)   . VWD (acquired von Willebrand's disease)   . Anxiety   . Spinal stenosis   . Kidney stone   . Bipolar disorder   . AR (allergic rhinitis)   . Pelvic pain in female   . Fibromyalgia     Past Surgical History  Procedure Laterality Date  . Tubal ligation    . Laparoscopy abdomen diagnostic    . Hysteroscopy      removed polyps  . Laparoscopic vaginal hysterectomy  2015    at [facility]  . Abdominal hysterectomy    . Laparoscopy Left 01/10/2015    Procedure: LAPAROSCOPY OPERATIVE with biopsy, left oopherectomy;  Surgeon: Herold Harms, MD;  Location: [facility]  ORS;  Service: Gynecology;  Laterality: Left;    Family History  Problem Relation Age of Onset  . Breast cancer Paternal Grandmother   . Diabetes Maternal Grandmother   . Hypertension Father   . Sleep apnea Mother   . Diabetes Maternal Aunt   . Diabetes Maternal Uncle     Social History   Social History  . Marital Status: Widowed    Spouse Name: N/A  . Number of Children: 1  . Years of Education: N/A   Occupational History  . Un-employed due to bipolar    Social History Main Topics  . Smoking status: Current Every Day Smoker -- 1.00 packs/day  . Smokeless tobacco: Never Used  . Alcohol Use: No     Comment: Occasional  . Drug Use: No  . Sexual Activity: No   Other Topics Concern  . Not on file   Social History Narrative   Regular Exercise -  NO   Daily Caffeine Use:  1 cup coffee in am      1 child, one step child           Current outpatient prescriptions:  .  Biotin w/ Vitamins C & E (HAIR SKIN & NAILS GUMMIES) 1250-7.5-7.5 MCG-MG-UNT CHEW, Chew 1 each by mouth daily., Disp: , Rfl:  .  clonazePAM (  KLONOPIN) 0.5 MG tablet, Take 1 tablet (0.5 mg total) by mouth 2 (two) times daily as needed for anxiety., Disp: 60 tablet, Rfl: 0 .  docusate sodium (COLACE) 100 MG capsule, Take 1 capsule (100 mg total) by mouth 2 (two) times daily., Disp: 60 capsule, Rfl: 0 .  ibuprofen (ADVIL,MOTRIN) 800 MG tablet, Take 1 tablet (800 mg total) by mouth 3 (three) times daily., Disp: 30 tablet, Rfl: 1 .  LYRICA 150 MG capsule, Take 1 capsule (150 mg total) by mouth 2 (two) times daily., Disp: 60 capsule, Rfl: 0 .  oxyCODONE-acetaminophen (PERCOCET) 10-325 MG per tablet, Take 1 tablet by mouth every 8 (eight) hours as needed for pain., Disp: 15 tablet, Rfl: 0 .  promethazine (PHENERGAN) 25 MG tablet, Take 1 tablet (25 mg total) by mouth daily as needed for nausea or vomiting., Disp: 30 tablet, Rfl: 0 .  traZODone (DESYREL) 150 MG tablet, Take 1 tablet by mouth at bedtime., Disp: , Rfl:  3  Allergies  Allergen Reactions  . Wellbutrin [Bupropion Hcl] Other (See Comments)    Mental breakdown? Suicidal   . Penicillins Other (See Comments)    Yeast infections  . Lasix [Furosemide] Rash  . Nitrofurantoin Monohyd Macro Rash     Review of Systems  Constitutional: Positive for irritability.  Respiratory: Positive for shortness of breath.   Cardiovascular: Negative for chest pain and palpitations.  Psychiatric/Behavioral: The patient has insomnia.       Objective  Filed Vitals:   02/14/15 1420  BP: 132/78  Pulse: 90  Temp: 98.7 F (37.1 C)  TempSrc: Oral  Resp: 18  Height:  (1.575 m)  Weight: 144 lb 12.8 oz (65.681 kg)  SpO2: 98%    Physical Exam  Constitutional: She is oriented to person, place, and time and well-developed, well-nourished, and in no distress.  HENT:  Head: Normocephalic and atraumatic.  Cardiovascular: Normal rate and regular rhythm.   Pulmonary/Chest: Effort normal and breath sounds normal.  Musculoskeletal:       Right knee: She exhibits normal range of motion, no swelling and no deformity. No tenderness found.       Left knee: She exhibits normal range of motion, no swelling and no deformity. No tenderness found.       Lumbar back: She exhibits tenderness, pain and spasm.       Back:  Palpable soft mobile mass over the left lower back, no surrounding erythema, but no tenderness.  Neurological: She is alert and oriented to person, place, and time.  Psychiatric: Memory and judgment normal. She exhibits a depressed mood.  Nursing note and vitals reviewed.   Assessment & Plan  1. Fibromyalgia Changed Percocet back to 7.5 mg 3 times a day when necessary. Continue on Lyrica. Follow-up in one month. - LYRICA 150 MG capsule; Take 1 capsule (150 mg total) by mouth 2 (two) times daily.  Dispense: 60 capsule; Refill: 0 - oxyCODONE-acetaminophen (PERCOCET) 7.5-325 MG per tablet; Take 1 tablet by mouth every 8 (eight) hours as needed  for severe pain.  Dispense: 90 tablet; Refill: 0  2. Generalized anxiety disorder Patient will continue on Klonopin 0.5 mg twice a day when necessary. She was asked to inform her psychiatrist that she has stopped taking BuSpar and would like to continue on clonazepam. Patient verbalized understanding. - clonazePAM (KLONOPIN) 0.5 MG tablet; Take 1 tablet (0.5 mg total) by mouth 2 (two) times daily as needed for anxiety.  Dispense: 60 tablet; Refill: 0  3. Subcutaneous  cyst  - [place] Misc Soft Tissue; Future  4. Drug-induced nausea and vomiting  - promethazine (PHENERGAN) 25 MG tablet; Take 1 tablet (25 mg total) by mouth daily as needed for nausea or vomiting.  Dispense: 30 tablet; Refill: 0   Cleavon Goldman Asad A[facility] 02/14/2015 2:47 PM

## 2015-02-15 ENCOUNTER — Other Ambulatory Visit: Payer: Self-pay | Admitting: Family Medicine

## 2015-02-24 ENCOUNTER — Ambulatory Visit (INDEPENDENT_AMBULATORY_CARE_PROVIDER_SITE_OTHER): Payer: Medicaid Other | Admitting: Obstetrics and Gynecology

## 2015-02-24 ENCOUNTER — Encounter: Payer: Self-pay | Admitting: Obstetrics and Gynecology

## 2015-02-24 VITALS — BP 95/68 | HR 139 | Ht 62.0 in | Wt 143.9 lb

## 2015-02-24 DIAGNOSIS — R102 Pelvic and perineal pain: Secondary | ICD-10-CM

## 2015-02-24 DIAGNOSIS — M545 Low back pain: Secondary | ICD-10-CM

## 2015-02-24 DIAGNOSIS — N736 Female pelvic peritoneal adhesions (postinfective): Secondary | ICD-10-CM

## 2015-02-24 NOTE — Progress Notes (Signed)
Patient ID: Casey Hodges, female   DOB: [birth year], 39 y.o.   MRN: [id number] 6 week post op S/p lap w/adhesiolysis Incision left side tender Vaginal pain  bm hurt her vaginal area  Chief complaint: 1.  Chronic pelvic pain/low back pain. 2.  6 weeks status post laparoscopic adhesiolysis.  Patient presents for interval follow-up.  She is reporting normal bowel and bladder function.  She is complaining of some left-sided incisional tenderness.  Vaginal pain and backache persists.  Review of note is notable for extensive adhesiolysis being performed to restore anatomy.  Biopsies from the vaginal cuff did not demonstrate endometriosis But there was pigment laden macrophages present.  Left ovary showed a benign ovary with follicular cyst and associated hemorrhage.  OBJECTIVE: BP 95/68 mmHg  Pulse 139  Ht  (1.575 m)  Wt 143 lb 14.4 oz (65.273 kg)  BMI 26.31 kg/m2  LMP 10/29/2013 Pleasant appearing female in no acute distress. Back: No CVA tenderness. Abdomen: Laparoscopy incisions are well-healed without evidence of hernia, no significant tenderness on palpation. Pelvic exam: External genitalia-normal BUS-normal Vagina-good vault support; tender vaginal cuff 2/4. Cervix-surgically absent Uterus-surgically absent. Adnexa-nonpalpable, nontender.  IMPRESSION: 1.  Possible endometriosis may be an etiology to her chronic pain, status post laparoscopic adhesiolysis.  PLAN: 1.  Trial of Lupron therapy. 2.  Return in 2 months for follow-up exam. 3.  Patient will return when Lupron arrives for first injection.  A total of 15 minutes were spent face-to-face with the patient during this encounter and over half of that time dealt with counseling and coordination of care.  Herold Harms, MD

## 2015-02-24 NOTE — Patient Instructions (Signed)
1. Pt will be called when Lupron arrives. 2. Resume all activities without restrictions.

## 2015-03-21 ENCOUNTER — Encounter: Payer: Self-pay | Admitting: Family Medicine

## 2015-03-21 ENCOUNTER — Ambulatory Visit (INDEPENDENT_AMBULATORY_CARE_PROVIDER_SITE_OTHER): Payer: Medicaid Other | Admitting: Family Medicine

## 2015-03-21 VITALS — BP 100/66 | HR 110 | Temp 98.5°F | Resp 19 | Ht 62.0 in | Wt 147.6 lb

## 2015-03-21 DIAGNOSIS — F411 Generalized anxiety disorder: Secondary | ICD-10-CM

## 2015-03-21 DIAGNOSIS — Z23 Encounter for immunization: Secondary | ICD-10-CM | POA: Insufficient documentation

## 2015-03-21 DIAGNOSIS — R112 Nausea with vomiting, unspecified: Secondary | ICD-10-CM | POA: Diagnosis not present

## 2015-03-21 DIAGNOSIS — T50905A Adverse effect of unspecified drugs, medicaments and biological substances, initial encounter: Secondary | ICD-10-CM

## 2015-03-21 DIAGNOSIS — M797 Fibromyalgia: Secondary | ICD-10-CM

## 2015-03-21 MED ORDER — CLONAZEPAM 0.5 MG PO TABS: 0.5000 mg | ORAL_TABLET | Freq: Three times a day (TID) | ORAL | Status: DC | PRN

## 2015-03-21 MED ORDER — PROMETHAZINE HCL 25 MG PO TABS: 25.0000 mg | ORAL_TABLET | Freq: Every day | ORAL | Status: DC | PRN

## 2015-03-21 MED ORDER — OXYCODONE-ACETAMINOPHEN 7.5-325 MG PO TABS: 1.0000 | ORAL_TABLET | Freq: Three times a day (TID) | ORAL | Status: DC | PRN

## 2015-03-21 MED ORDER — LYRICA 150 MG PO CAPS: 150.0000 mg | ORAL_CAPSULE | Freq: Two times a day (BID) | ORAL | Status: DC

## 2015-03-21 NOTE — Progress Notes (Signed)
Name: Casey Hodges   MRN: [id number]    DOB: Nov 14, 1975   Date:03/21/2015       Progress Note  Subjective  Chief Complaint  Chief Complaint  Patient presents with  . Follow-up    1 mo  . Medication Refill    Anxiety Presents for follow-up visit. Symptoms include depressed mood, insomnia, malaise, nervous/anxious behavior and shortness of breath.   Past treatments include benzodiazephines. The treatment provided moderate (Patient states her symptoms of anxiety are not adequately controlled on present benzodiazepine therapy.) relief. Compliance with prior treatments has been good.   Fibromyalgia Pt. Has generalized musculoskeletal aches and pains in lower back, both knees, both hips, and legs. She sometimes has difficulty walking because of the pain. She is on Percocet 7.5-325 mg three times daily as needed and Lyrica  twice daily.   Past Medical History  Diagnosis Date  . Bipolar affective     pt reported  . Uterine polyp   . Eating disorder     Under control per patient  . Renal disorder   . Painful menstrual periods   . Abnormal vaginal Pap smear     10+ years ago- no colpo repeat was normal  . Painful intercourse   . GERD (gastroesophageal reflux disease)   . VWD (acquired von Willebrand's disease)   . Anxiety   . Spinal stenosis   . Kidney stone   . Bipolar disorder   . AR (allergic rhinitis)   . Pelvic pain in female   . Fibromyalgia   . PTSD (post-traumatic stress disorder)     Past Surgical History  Procedure Laterality Date  . Tubal ligation    . Laparoscopy abdomen diagnostic    . Hysteroscopy      removed polyps  . Laparoscopic vaginal hysterectomy  2015    at [facility]  . Abdominal hysterectomy    . Laparoscopy Left 01/10/2015    Procedure: LAPAROSCOPY OPERATIVE with biopsy, left oopherectomy;  Surgeon: Herold Harms, MD;  Location: [facility] ORS;  Service: Gynecology;  Laterality: Left;    Family History  Problem Relation Age of Onset  .  Breast cancer Paternal Grandmother   . Diabetes Maternal Grandmother   . Hypertension Father   . Sleep apnea Mother   . Diabetes Maternal Aunt   . Diabetes Maternal Uncle     Social History   Social History  . Marital Status: Widowed    Spouse Name: N/A  . Number of Children: 1  . Years of Education: N/A   Occupational History  . Un-employed due to bipolar    Social History Main Topics  . Smoking status: Current Every Day Smoker -- 1.00 packs/day  . Smokeless tobacco: Never Used  . Alcohol Use: No     Comment: Occasional  . Drug Use: No  . Sexual Activity: No   Other Topics Concern  . Not on file   Social History Narrative   Regular Exercise -  NO   Daily Caffeine Use:  1 cup coffee in am      1 child, one step child           Current outpatient prescriptions:  .  Biotin w/ Vitamins C & E (HAIR SKIN & NAILS GUMMIES) 1250-7.5-7.5 MCG-MG-UNT CHEW, Chew 1 each by mouth daily., Disp: , Rfl:  .  clonazePAM (KLONOPIN) 0.5 MG tablet, Take 1 tablet (0.5 mg total) by mouth 2 (two) times daily as needed for anxiety., Disp: 60 tablet, Rfl: 0 .  ibuprofen (ADVIL,MOTRIN) 800 MG tablet, Take 1 tablet (800 mg total) by mouth 3 (three) times daily., Disp: 30 tablet, Rfl: 1 .  LYRICA 150 MG capsule, Take 1 capsule (150 mg total) by mouth 2 (two) times daily., Disp: 60 capsule, Rfl: 0 .  Melatonin 10 MG SUBL, Place 10 mg under the tongue at bedtime., Disp: , Rfl:  .  oxyCODONE-acetaminophen (PERCOCET) 10-325 MG per tablet, Take 1 tablet by mouth every 8 (eight) hours as needed for pain., Disp: 15 tablet, Rfl: 0 .  oxyCODONE-acetaminophen (PERCOCET) 7.5-325 MG per tablet, Take 1 tablet by mouth every 8 (eight) hours as needed for severe pain., Disp: 90 tablet, Rfl: 0 .  promethazine (PHENERGAN) 25 MG tablet, Take 1 tablet (25 mg total) by mouth daily as needed for nausea or vomiting., Disp: 30 tablet, Rfl: 0 .  SEROQUEL XR 300 MG 24 hr tablet, TAKE 1 TABLET BY MOUTH AT 5:30 PM, Disp: ,  Rfl: 1 .  traZODone (DESYREL) 150 MG tablet, Take 1 tablet by mouth at bedtime., Disp: , Rfl: 3  Allergies  Allergen Reactions  . Wellbutrin [Bupropion Hcl] Other (See Comments)    Mental breakdown? Suicidal   . Hydrocodone-Acetaminophen Nausea And Vomiting  . Nitrofurantoin Monohyd Macro Other (See Comments)  . Penicillins Other (See Comments)    Yeast infections  . Vicoprofen  [Hydrocodone-Ibuprofen] Nausea And Vomiting  . Lasix [Furosemide] Rash and Other (See Comments)  . Nitrofurantoin Monohyd Macro Rash   Review of Systems  Respiratory: Positive for shortness of breath.   Musculoskeletal: Positive for back pain and joint pain.  Psychiatric/Behavioral: Positive for depression. The patient is nervous/anxious and has insomnia.     Objective  Filed Vitals:   03/21/15 1359  BP: 100/66  Pulse: 110  Temp: 98.5 F (36.9 C)  TempSrc: Oral  Resp: 19  Height:  (1.575 m)  Weight: 147 lb 9.6 oz (66.951 kg)  SpO2: 99%    Physical Exam  Constitutional: She is well-developed, well-nourished, and in no distress.  Cardiovascular: Normal rate and regular rhythm.   Pulmonary/Chest: Effort normal and breath sounds normal.  Musculoskeletal:       Right hip: She exhibits tenderness.       Left hip: She exhibits tenderness.       Lumbar back: She exhibits tenderness and pain.  Neurological: She is alert.  Psychiatric: Mood and memory normal.  Nursing note and vitals reviewed.  Assessment & Plan  1. Need for immunization against influenza  - Flu Vaccine QUAD 36+ mos IM  2. Fibromyalgia  - LYRICA 150 MG capsule; Take 1 capsule (150 mg total) by mouth 2 (two) times daily.  Dispense: 60 capsule; Refill: 0 - oxyCODONE-acetaminophen (PERCOCET) 7.5-325 MG per tablet; Take 1 tablet by mouth every 8 (eight) hours as needed for severe pain.  Dispense: 90 tablet; Refill: 0  3. Generalized anxiety disorder Increase clonazepam to 0.5 mg 3 times a day when necessary for optimal  treatment of symptoms of anxiety. - clonazePAM (KLONOPIN) 0.5 MG tablet; Take 1 tablet (0.5 mg total) by mouth 3 (three) times daily as needed for anxiety.  Dispense: 90 tablet; Refill: 0  4. Drug-induced nausea and vomiting  - promethazine (PHENERGAN) 25 MG tablet; Take 1 tablet (25 mg total) by mouth daily as needed for nausea or vomiting.  Dispense: 30 tablet; Refill: 0    Ules Marsala Asad A[facility] 03/21/2015 2:27 PM

## 2015-03-22 DIAGNOSIS — T50905A Adverse effect of unspecified drugs, medicaments and biological substances, initial encounter: Secondary | ICD-10-CM

## 2015-03-22 DIAGNOSIS — R112 Nausea with vomiting, unspecified: Secondary | ICD-10-CM | POA: Insufficient documentation

## 2015-04-20 ENCOUNTER — Ambulatory Visit (INDEPENDENT_AMBULATORY_CARE_PROVIDER_SITE_OTHER): Payer: Medicaid Other | Admitting: Family Medicine

## 2015-04-20 ENCOUNTER — Encounter: Payer: Self-pay | Admitting: Family Medicine

## 2015-04-20 VITALS — BP 102/71 | HR 98 | Temp 97.6°F | Resp 18 | Ht 62.0 in | Wt 148.0 lb

## 2015-04-20 DIAGNOSIS — F411 Generalized anxiety disorder: Secondary | ICD-10-CM

## 2015-04-20 DIAGNOSIS — R112 Nausea with vomiting, unspecified: Secondary | ICD-10-CM

## 2015-04-20 DIAGNOSIS — R208 Other disturbances of skin sensation: Secondary | ICD-10-CM | POA: Diagnosis not present

## 2015-04-20 DIAGNOSIS — T50905A Adverse effect of unspecified drugs, medicaments and biological substances, initial encounter: Principal | ICD-10-CM

## 2015-04-20 DIAGNOSIS — R200 Anesthesia of skin: Secondary | ICD-10-CM

## 2015-04-20 DIAGNOSIS — M797 Fibromyalgia: Secondary | ICD-10-CM | POA: Diagnosis not present

## 2015-04-20 MED ORDER — PROMETHAZINE HCL 25 MG PO TABS: 25.0000 mg | ORAL_TABLET | Freq: Every day | ORAL | Status: DC | PRN

## 2015-04-20 MED ORDER — OXYCODONE-ACETAMINOPHEN 7.5-325 MG PO TABS: 1.0000 | ORAL_TABLET | Freq: Three times a day (TID) | ORAL | Status: DC | PRN

## 2015-04-20 MED ORDER — CLONAZEPAM 0.5 MG PO TABS: 0.5000 mg | ORAL_TABLET | Freq: Three times a day (TID) | ORAL | Status: DC | PRN

## 2015-04-20 NOTE — Progress Notes (Signed)
Name: Casey Hodges   MRN: [id number]    DOB: 11/26/1975   Date:04/20/2015       Progress Note  Subjective  Chief Complaint  Chief Complaint  Patient presents with  . Follow-up    1 mo  . Medication Refill    Anxiety Presents for follow-up visit. Symptoms include depressed mood, excessive worry, insomnia, nervous/anxious behavior, panic and shortness of breath.   Past treatments include benzodiazephines. Compliance with prior treatments has been good.   Fibromyalgia Pt. With complaints of generalized muscle aches and pains, most notably in hip, lower back, neck, and knees. She has 'tenderness' on her thighs, arms and sides of her abdomen. She is on Percocet 7.5mg  three times daily as needed. Now off Lyrica as pharmacy is having a tough time getting it approved by her insurance.  Numbness of Right Toe  Pt. Reports right big toe is 'numb' on the right side for 2-3 monthss. Sometimes, it has a burning sensation and when she walks, she feels a 'pop' in the toe. No history of trauma/injury to the toe and is otherwise able to move the toe.   Past Medical History  Diagnosis Date  . Bipolar affective (HCC)     pt reported  . Uterine polyp   . Eating disorder     Under control per patient  . Renal disorder   . Painful menstrual periods   . Abnormal vaginal Pap smear     10+ years ago- no colpo repeat was normal  . Painful intercourse   . GERD (gastroesophageal reflux disease)   . VWD (acquired von Willebrand's disease) (HCC)   . Anxiety   . Spinal stenosis   . Kidney stone   . Bipolar disorder (HCC)   . AR (allergic rhinitis)   . Pelvic pain in female   . Fibromyalgia   . PTSD (post-traumatic stress disorder)     Past Surgical History  Procedure Laterality Date  . Tubal ligation    . Laparoscopy abdomen diagnostic    . Hysteroscopy      removed polyps  . [facility]  . Abdominal hysterectomy    . Laparoscopy Left 01/10/2015     Procedure: LAPAROSCOPY OPERATIVE with biopsy, left oopherectomy;  Surgeon: Herold HarmsMartin A Defrancesco, MD;  Location: [facility] ORS;  Service: Gynecology;  Laterality: Left;    Family History  Problem Relation Age of Onset  . Breast cancer Paternal Grandmother   . Diabetes Maternal Grandmother   . Hypertension Father   . Sleep apnea Mother   . Diabetes Maternal Aunt   . Diabetes Maternal Uncle     Social History   Social History  . Marital Status: Widowed    Spouse Name: N/A  . Number of Children: 1  . Years of Education: N/A   Occupational History  . Un-employed due to bipolar    Social History Main Topics  . Smoking status: Current Every Day Smoker -- 1.00 packs/day  . Smokeless tobacco: Never Used  . Alcohol Use: No     Comment: Occasional  . Drug Use: No  . Sexual Activity: No   Other Topics Concern  . Not on file   Social History Narrative   Regular Exercise -  NO   Daily Caffeine Use:  1 cup coffee in am      1 child, one step child           Current outpatient prescriptions:  .  Biotin w/ Vitamins C & E (HAIR SKIN & NAILS GUMMIES) 1250-7.5-7.5 MCG-MG-UNT CHEW, Chew 1 each by mouth daily., Disp: , Rfl:  .  clonazePAM (KLONOPIN) 0.5 MG tablet, Take 1 tablet (0.5 mg total) by mouth 3 (three) times daily as needed for anxiety., Disp: 90 tablet, Rfl: 0 .  ibuprofen (ADVIL,MOTRIN) 800 MG tablet, Take 1 tablet (800 mg total) by mouth 3 (three) times daily., Disp: 30 tablet, Rfl: 1 .  LYRICA 150 MG capsule, Take 1 capsule (150 mg total) by mouth 2 (two) times daily., Disp: 60 capsule, Rfl: 0 .  Melatonin 10 MG SUBL, Place 10 mg under the tongue at bedtime., Disp: , Rfl:  .  oxyCODONE-acetaminophen (PERCOCET) 7.5-325 MG per tablet, Take 1 tablet by mouth every 8 (eight) hours as needed for severe pain., Disp: 90 tablet, Rfl: 0 .  promethazine (PHENERGAN) 25 MG tablet, Take 1 tablet (25 mg total) by mouth daily as needed for nausea or vomiting., Disp: 30 tablet, Rfl: 0 .   SEROQUEL XR 300 MG 24 hr tablet, TAKE 1 TABLET BY MOUTH AT 5:30 PM, Disp: , Rfl: 1 .  traZODone (DESYREL) 150 MG tablet, Take 1 tablet by mouth at bedtime., Disp: , Rfl: 3  Allergies  Allergen Reactions  . Wellbutrin [Bupropion Hcl] Other (See Comments)    Mental breakdown? Suicidal   . Hydrocodone-Acetaminophen Nausea And Vomiting  . Nitrofurantoin Monohyd Macro Other (See Comments)  . Penicillins Other (See Comments)    Yeast infections  . Vicoprofen  [Hydrocodone-Ibuprofen] Nausea And Vomiting  . Lasix [Furosemide] Rash and Other (See Comments)  . Nitrofurantoin Monohyd Macro Rash   Review of Systems  Constitutional: Negative for fever and chills.  Respiratory: Positive for shortness of breath.   Musculoskeletal: Positive for back pain, joint pain and neck pain.  Neurological: Negative for tingling.  Psychiatric/Behavioral: Positive for depression. The patient is nervous/anxious and has insomnia.    Objective  Filed Vitals:   04/20/15 1344  BP: 102/71  Pulse: 98  Temp: 97.6 F (36.4 C)  TempSrc: Oral  Resp: 18  Height:  (1.575 m)  Weight: 148 lb (67.132 kg)  SpO2: 98%    Physical Exam  Constitutional: She is oriented to person, place, and time and well-developed, well-nourished, and in no distress.  Cardiovascular: Normal rate and regular rhythm.   Pulmonary/Chest: Effort normal and breath sounds normal.  Musculoskeletal:       Right hip: She exhibits tenderness.       Left hip: She exhibits tenderness.       Right knee: Tenderness found.       Left knee: Tenderness found.       Lumbar back: She exhibits tenderness, pain and spasm.       Back:       Feet:  Decreased sensation to light touch over the medial portion of right great toe. Sensation to sharp touch intact. Dorsalis Pedis pulse intact. Strength of toe intact. Foot generally feels cool to touch   Neurological: She is alert and oriented to person, place, and time.  Psychiatric: Memory, affect and  judgment normal.  Nursing note and vitals reviewed.    Assessment & Plan  1. Drug-induced nausea and vomiting Anti-emetic to relieve opioid-induced nausea and vomiting. - promethazine (PHENERGAN) 25 MG tablet; Take 1 tablet (25 mg total) by mouth daily as needed for nausea or vomiting.  Dispense: 30 tablet; Refill: 0  2. Fibromyalgia Patient is on Percocet 7.5 mg every 8 hours as  needed. For the last month, she has not been on Lyrica as her pharmacy cannot get it approved through her insurance. - oxyCODONE-acetaminophen (PERCOCET) 7.5-325 MG tablet; Take 1 tablet by mouth every 8 (eight) hours as needed for severe pain.  Dispense: 90 tablet; Refill: 0  3. Generalized anxiety disorder Symptoms are stable and controlled on clonazepam 0.5 mg 3 times daily as needed. Patient also follows up with mental health for depression. - clonazePAM (KLONOPIN) 0.5 MG tablet; Take 1 tablet (0.5 mg total) by mouth 3 (three) times daily as needed for anxiety.  Dispense: 90 tablet; Refill: 0  4. Numbness of foot Obtain lab work for evaluation of right great toe paresthesia. We will obtain x-ray of the lower back to rule out any acute abnormality. - B12 - Folate - CBC w/Diff/Platelet - DG Lumbar Spine Complete; Future   Silas Muff Asad A. [facility] 04/20/2015 2:01 PM

## 2015-04-21 ENCOUNTER — Ambulatory Visit
Admission: RE | Admit: 2015-04-21 | Discharge: 2015-04-21 | Disposition: A | Discharge status: Home / Self Care | Payer: Medicaid Other | Source: Ambulatory Visit | Attending: Family Medicine | Admitting: Family Medicine

## 2015-04-21 DIAGNOSIS — M545 Low back pain: Secondary | ICD-10-CM | POA: Diagnosis present

## 2015-04-21 DIAGNOSIS — R200 Anesthesia of skin: Secondary | ICD-10-CM

## 2015-04-21 DIAGNOSIS — R208 Other disturbances of skin sensation: Secondary | ICD-10-CM | POA: Insufficient documentation

## 2015-04-21 LAB — CBC WITH DIFFERENTIAL/PLATELET
Basophils Absolute: 0 10*3/uL (ref 0.0–0.2)
Basos: 0 %
EOS (ABSOLUTE): 0.1 10*3/uL (ref 0.0–0.4)
EOS: 1 %
HEMATOCRIT: 39.5 % (ref 34.0–46.6)
HEMOGLOBIN: 13.2 g/dL (ref 11.1–15.9)
Immature Grans (Abs): 0 10*3/uL (ref 0.0–0.1)
Immature Granulocytes: 0 %
LYMPHS ABS: 2.7 10*3/uL (ref 0.7–3.1)
Lymphs: 39 %
MCH: 30.3 pg (ref 26.6–33.0)
MCHC: 33.4 g/dL (ref 31.5–35.7)
MCV: 91 fL (ref 79–97)
MONOCYTES: 7 %
MONOS ABS: 0.5 10*3/uL (ref 0.1–0.9)
NEUTROS ABS: 3.7 10*3/uL (ref 1.4–7.0)
Neutrophils: 53 %
Platelets: 271 10*3/uL (ref 150–379)
RBC: 4.36 x10E6/uL (ref 3.77–5.28)
RDW: 13.4 % (ref 12.3–15.4)
WBC: 6.9 10*3/uL (ref 3.4–10.8)

## 2015-04-21 LAB — FOLATE

## 2015-04-21 LAB — VITAMIN B12: Vitamin B-12: 371 pg/mL (ref 211–946)

## 2015-04-26 ENCOUNTER — Ambulatory Visit: Payer: Medicaid Other | Admitting: Obstetrics and Gynecology

## 2015-05-18 ENCOUNTER — Encounter: Payer: Self-pay | Admitting: Family Medicine

## 2015-05-18 ENCOUNTER — Ambulatory Visit (INDEPENDENT_AMBULATORY_CARE_PROVIDER_SITE_OTHER): Payer: Medicaid Other | Admitting: Family Medicine

## 2015-05-18 VITALS — BP 108/62 | HR 126 | Temp 98.1°F | Resp 18 | Ht 62.0 in | Wt 149.1 lb

## 2015-05-18 DIAGNOSIS — R208 Other disturbances of skin sensation: Secondary | ICD-10-CM

## 2015-05-18 DIAGNOSIS — R200 Anesthesia of skin: Secondary | ICD-10-CM

## 2015-05-18 DIAGNOSIS — M797 Fibromyalgia: Secondary | ICD-10-CM | POA: Diagnosis not present

## 2015-05-18 DIAGNOSIS — F411 Generalized anxiety disorder: Secondary | ICD-10-CM

## 2015-05-18 MED ORDER — LYRICA 150 MG PO CAPS: 150.0000 mg | ORAL_CAPSULE | Freq: Two times a day (BID) | ORAL | Status: DC

## 2015-05-18 MED ORDER — OXYCODONE-ACETAMINOPHEN 7.5-325 MG PO TABS: 1.0000 | ORAL_TABLET | Freq: Three times a day (TID) | ORAL | Status: DC | PRN

## 2015-05-18 MED ORDER — CLONAZEPAM 0.5 MG PO TABS: 0.5000 mg | ORAL_TABLET | Freq: Three times a day (TID) | ORAL | Status: DC | PRN

## 2015-05-18 NOTE — Progress Notes (Signed)
Name: Casey Hodges   MRN: [id number]    DOB: 06/17/1976   Date:05/18/2015       Progress Note  Subjective  Chief Complaint  Chief Complaint  Patient presents with  . Medication Refill    1 month F/U  . Fibromyalgia    controlling symptoms  . Anxiety    worsten due to the holidays coming up and losing husband 8 months ago, patient is not sleeping at night getting 2-3 hours nightly.    Anxiety Presents for follow-up visit. The problem has been gradually worsening (worse from last month, especially closer to holidays,). Symptoms include excessive worry, nervous/anxious behavior and panic. Patient reports no chest pain or shortness of breath. Insomnia: pt. wants to go back to Alprazolam as she thinks it was helping more with her sleep AND anxiety vs Clonazepam.   Past treatments include benzodiazephines. The treatment provided significant relief. Compliance with prior treatments has been good.   Fibromyalgia Pt. is here for follow up of Fibromyalgia. She has chronic back pain, bilateral knee, hip,and shoulder pain. She is taking Percocet 7.5/325mg  every 8 hours as needed and Lyrica 150 mg twice daily. This helps relieve her pain.   Right Toe Numbness Pt. Is here for follow up of right toe numbness, present for at least 2 months. Symptoms present all the time. X ray of lower back was unremarkable. Lab work including testing for B12, Folate, and CBC was negative.   Past Medical History  Diagnosis Date  . Bipolar affective (HCC)     pt reported  . Uterine polyp   . Eating disorder     Under control per patient  . Renal disorder   . Painful menstrual periods   . Abnormal vaginal Pap smear     10+ years ago- no colpo repeat was normal  . Painful intercourse   . GERD (gastroesophageal reflux disease)   . VWD (acquired von Willebrand's disease) (HCC)   . Anxiety   . Spinal stenosis   . Kidney stone   . Bipolar disorder (HCC)   . AR (allergic rhinitis)   . Pelvic pain in female     . Fibromyalgia   . PTSD (post-traumatic stress disorder)     Past Surgical History  Procedure Laterality Date  . Tubal ligation    . Laparoscopy abdomen diagnostic    . Hysteroscopy      removed polyps  . Laparoscopic vaginal hysterectomy  2015    at [facility]  . Abdominal hysterectomy    . Laparoscopy Left 01/10/2015    Procedure: LAPAROSCOPY OPERATIVE with biopsy, left oopherectomy;  Surgeon: Herold Harms, MD;  Location: [facility] ORS;  Service: Gynecology;  Laterality: Left;    Family History  Problem Relation Age of Onset  . Breast cancer Paternal Grandmother   . Diabetes Maternal Grandmother   . Hypertension Father   . Sleep apnea Mother   . Diabetes Maternal Aunt   . Diabetes Maternal Uncle     Social History   Social History  . Marital Status: Widowed    Spouse Name: N/A  . Number of Children: 1  . Years of Education: N/A   Occupational History  . Un-employed due to bipolar    Social History Main Topics  . Smoking status: Current Every Day Smoker -- 0.75 packs/day    Types: Cigarettes    Start date: 05/18/1995  . Smokeless tobacco: Never Used  . Alcohol Use: 0.0 oz/week    0 Standard drinks or  equivalent per week     Comment: Socially  . Drug Use: No  . Sexual Activity: No   Other Topics Concern  . Not on file   Social History Narrative   Regular Exercise -  NO   Daily Caffeine Use:  1 cup coffee in am      1 child, one step child           Current outpatient prescriptions:  .  Biotin w/ Vitamins C & E (HAIR SKIN & NAILS GUMMIES) 1250-7.5-7.5 MCG-MG-UNT CHEW, Chew 1 each by mouth daily., Disp: , Rfl:  .  clonazePAM (KLONOPIN) 0.5 MG tablet, Take 1 tablet (0.5 mg total) by mouth 3 (three) times daily as needed for anxiety., Disp: 90 tablet, Rfl: 0 .  ibuprofen (ADVIL,MOTRIN) 800 MG tablet, Take 1 tablet (800 mg total) by mouth 3 (three) times daily., Disp: 30 tablet, Rfl: 1 .  LYRICA 150 MG capsule, Take 1 capsule (150 mg total) by mouth 2  (two) times daily., Disp: 60 capsule, Rfl: 0 .  Melatonin 10 MG SUBL, Place 10 mg under the tongue at bedtime., Disp: , Rfl:  .  oxyCODONE-acetaminophen (PERCOCET) 7.5-325 MG tablet, Take 1 tablet by mouth every 8 (eight) hours as needed for severe pain., Disp: 90 tablet, Rfl: 0 .  prazosin (MINIPRESS) 1 MG capsule, TAKE 1 DAILY FOR A WEEK AND THEN TAKE 1 TABLET 2 TIMES DAILY, Disp: , Rfl: 0 .  promethazine (PHENERGAN) 25 MG tablet, Take 1 tablet (25 mg total) by mouth daily as needed for nausea or vomiting., Disp: 30 tablet, Rfl: 0 .  traZODone (DESYREL) 150 MG tablet, Take 1 tablet by mouth at bedtime., Disp: , Rfl: 3  Allergies  Allergen Reactions  . Wellbutrin [Bupropion Hcl] Other (See Comments)    Mental breakdown? Suicidal   . Hydrocodone-Acetaminophen Nausea And Vomiting  . Nitrofurantoin Monohyd Macro Other (See Comments)  . Penicillins Other (See Comments)    Yeast infections  . Vicoprofen  [Hydrocodone-Ibuprofen] Nausea And Vomiting  . Lasix [Furosemide] Rash and Other (See Comments)  . Nitrofurantoin Monohyd Macro Rash   Review of Systems  Respiratory: Negative for shortness of breath.   Cardiovascular: Negative for chest pain.  Musculoskeletal: Positive for back pain and joint pain.  Neurological: Positive for tingling.  Psychiatric/Behavioral: Positive for depression. The patient is nervous/anxious. Insomnia: pt. wants to go back to Alprazolam as she thinks it was helping more with her sleep AND anxiety vs Clonazepam.     Objective  Filed Vitals:   05/18/15 1409  BP: 108/62  Pulse: 126  Temp: 98.1 F (36.7 C)  TempSrc: Oral  Resp: 18  Height: 5\' 2"  (1.575 m)  Weight: 149 lb 1.6 oz (67.631 kg)  SpO2: 97%    Physical Exam  Constitutional: She is oriented to person, place, and time and well-developed, well-nourished, and in no distress.  Cardiovascular: Normal rate, regular rhythm and normal heart sounds.   No murmur heard. Pulmonary/Chest: Effort normal and  breath sounds normal. She has no wheezes.  Musculoskeletal:       Lumbar back: She exhibits tenderness, pain and spasm.       Back:  Neurological: She is alert and oriented to person, place, and time.  Psychiatric: Mood, memory, affect and judgment normal.  Nursing note and vitals reviewed.   Assessment & Plan  1. Fibromyalgia Pain is controlled on current therapy.  - LYRICA 150 MG capsule; Take 1 capsule (150 mg total) by mouth 2 (two) times  daily.  Dispense: 60 capsule; Refill: 0 - oxyCODONE-acetaminophen (PERCOCET) 7.5-325 MG tablet; Take 1 tablet by mouth every 8 (eight) hours as needed for severe pain.  Dispense: 90 tablet; Refill: 0  2. Generalized anxiety disorder DC clonazepam via taper and follow-up in 10 days to start on alprazolam. - clonazePAM (KLONOPIN) 0.5 MG tablet; Take 1 tablet (0.5 mg total) by mouth 3 (three) times daily as needed for anxiety. Clonazepam 0.5mg  TID PRN x 3 days, then 0.5 mg BID PRN x 3 days, then 0.5mg  QD PRN x 3 days, then 0.25mg  QD PRN x 1 day, then STOP.  Dispense: 20 tablet; Refill: 0  3. Numbness of foot  - Ambulatory referral to Neurology    [facility] 05/18/2015 2:25 PM

## 2015-05-25 ENCOUNTER — Ambulatory Visit (INDEPENDENT_AMBULATORY_CARE_PROVIDER_SITE_OTHER): Payer: Medicaid Other | Admitting: Family Medicine

## 2015-05-25 ENCOUNTER — Encounter: Payer: Self-pay | Admitting: Family Medicine

## 2015-05-25 VITALS — BP 108/70 | HR 101 | Temp 97.6°F | Resp 18 | Ht 62.0 in | Wt 152.8 lb

## 2015-05-25 DIAGNOSIS — M797 Fibromyalgia: Secondary | ICD-10-CM | POA: Diagnosis not present

## 2015-05-25 DIAGNOSIS — R011 Cardiac murmur, unspecified: Secondary | ICD-10-CM | POA: Diagnosis not present

## 2015-05-25 DIAGNOSIS — F411 Generalized anxiety disorder: Secondary | ICD-10-CM

## 2015-05-25 DIAGNOSIS — R8290 Unspecified abnormal findings in urine: Secondary | ICD-10-CM | POA: Insufficient documentation

## 2015-05-25 DIAGNOSIS — R8299 Other abnormal findings in urine: Secondary | ICD-10-CM | POA: Diagnosis not present

## 2015-05-25 MED ORDER — ALPRAZOLAM 0.5 MG PO TABS: 0.5000 mg | ORAL_TABLET | Freq: Three times a day (TID) | ORAL | Status: DC | PRN

## 2015-05-25 NOTE — Progress Notes (Signed)
Name: Casey Hodges   MRN: [id number]    DOB: 11-Apr-1976   Date:05/25/2015       Progress Note  Subjective  Chief Complaint  Chief Complaint  Patient presents with  . Follow-up    10 day f/u Medication change   Anxiety Presents for follow-up visit. Onset was 1 to 5 years ago. Symptoms include excessive worry, insomnia, nervous/anxious behavior and panic. Patient reports no chest pain, irritability or shortness of breath. The severity of symptoms is moderate.   Past treatments include benzodiazephines. The treatment provided mild relief. Compliance with prior treatments has been good. Prior compliance problems include medication issues.   Pt. also desires to be tested for Lupus. She has done some research on Fibromyalgia muscle pain and is wondering if she has Lupus instead. She has no rashes but has long-standing generalized muscle and joint pain including shoulder, lower back, feet, and elbows. She has history of von Willebrand's disease and no family history of Lupus.   Pt. Has noticed cloudy and yellowish colored urine for past 2-3 days, no increased frequency, or urgency. No blood in urine. She has had a 24-hour virus which caused nausea but no vomiting which  Has resolved. She is not sexually active.   Past Medical History  Diagnosis Date  . Bipolar affective (HCC)     pt reported  . Uterine polyp   . Eating disorder     Under control per patient  . Renal disorder   . Painful menstrual periods   . Abnormal vaginal Pap smear     10+ years ago- no colpo repeat was normal  . Painful intercourse   . GERD (gastroesophageal reflux disease)   . VWD (acquired von Willebrand's disease) (HCC)   . Anxiety   . Spinal stenosis   . Kidney stone   . Bipolar disorder (HCC)   . AR (allergic rhinitis)   . Pelvic pain in female   . Fibromyalgia   . PTSD (post-traumatic stress disorder)     Past Surgical History  Procedure Laterality Date  . Tubal ligation    . Laparoscopy abdomen  diagnostic    . Hysteroscopy      removed polyps  . Laparoscopic vaginal hysterectomy  2015    at [facility]  . Abdominal hysterectomy    . Laparoscopy Left 01/10/2015    Procedure: LAPAROSCOPY OPERATIVE with biopsy, left oopherectomy;  Surgeon: Herold Harms, MD;  Location: [facility] ORS;  Service: Gynecology;  Laterality: Left;    Family History  Problem Relation Age of Onset  . Breast cancer Paternal Grandmother   . Diabetes Maternal Grandmother   . Hypertension Father   . Sleep apnea Mother   . Diabetes Maternal Aunt   . Diabetes Maternal Uncle     Social History   Social History  . Marital Status: Widowed    Spouse Name: N/A  . Number of Children: 1  . Years of Education: N/A   Occupational History  . Un-employed due to bipolar    Social History Main Topics  . Smoking status: Current Every Day Smoker -- 0.75 packs/day    Types: Cigarettes    Start date: 05/18/1995  . Smokeless tobacco: Never Used  . Alcohol Use: 0.0 oz/week    0 Standard drinks or equivalent per week     Comment: Socially  . Drug Use: No  . Sexual Activity: No   Other Topics Concern  . Not on file   Social History Narrative  Regular Exercise -  NO   Daily Caffeine Use:  1 cup coffee in am      1 child, one step child           Current outpatient prescriptions:  .  Biotin w/ Vitamins C & E (HAIR SKIN & NAILS GUMMIES) 1250-7.5-7.5 MCG-MG-UNT CHEW, Chew 1 each by mouth daily., Disp: , Rfl:  .  clonazePAM (KLONOPIN) 0.5 MG tablet, Take 1 tablet (0.5 mg total) by mouth 3 (three) times daily as needed for anxiety. Clonazepam 0.5mg  TID PRN x 3 days, then 0.5 mg BID PRN x 3 days, then 0.5mg  QD PRN x 3 days, then 0.25mg  QD PRN x 1 day, then STOP., Disp: 20 tablet, Rfl: 0 .  ibuprofen (ADVIL,MOTRIN) 800 MG tablet, Take 1 tablet (800 mg total) by mouth 3 (three) times daily., Disp: 30 tablet, Rfl: 1 .  LYRICA 150 MG capsule, Take 1 capsule (150 mg total) by mouth 2 (two) times daily., Disp: 60  capsule, Rfl: 0 .  Melatonin 10 MG SUBL, Place 10 mg under the tongue at bedtime., Disp: , Rfl:  .  oxyCODONE-acetaminophen (PERCOCET) 7.5-325 MG tablet, Take 1 tablet by mouth every 8 (eight) hours as needed for severe pain., Disp: 90 tablet, Rfl: 0 .  promethazine (PHENERGAN) 25 MG tablet, Take 1 tablet (25 mg total) by mouth daily as needed for nausea or vomiting., Disp: 30 tablet, Rfl: 0 .  traZODone (DESYREL) 150 MG tablet, Take 1 tablet by mouth at bedtime., Disp: , Rfl: 3 .  prazosin (MINIPRESS) 1 MG capsule, TAKE 1 DAILY FOR A WEEK AND THEN TAKE 1 TABLET 2 TIMES DAILY, Disp: , Rfl: 0  Allergies  Allergen Reactions  . Wellbutrin [Bupropion Hcl] Other (See Comments)    Mental breakdown? Suicidal   . Hydrocodone-Acetaminophen Nausea And Vomiting  . Nitrofurantoin Monohyd Macro Other (See Comments)  . Penicillins Other (See Comments)    Yeast infections  . Vicoprofen  [Hydrocodone-Ibuprofen] Nausea And Vomiting  . Lasix [Furosemide] Rash and Other (See Comments)  . Nitrofurantoin Monohyd Macro Rash    Review of Systems  Constitutional: Negative for irritability.  Respiratory: Negative for shortness of breath.   Cardiovascular: Negative for chest pain.  Psychiatric/Behavioral: Positive for depression. The patient is nervous/anxious and has insomnia.     Objective  Filed Vitals:   05/25/15 1439  BP: 108/70  Pulse: 101  Temp: 97.6 F (36.4 C)  TempSrc: Oral  Resp: 18  Height: 5\' 2"  (1.575 m)  Weight: 152 lb 12.8 oz (69.31 kg)  SpO2: 98%    Physical Exam  Constitutional: She is oriented to person, place, and time and well-developed, well-nourished, and in no distress.  HENT:  Head: Normocephalic and atraumatic.  Cardiovascular: Normal rate.   Murmur heard.  Systolic murmur is present with a grade of 2/6  Pulmonary/Chest: Effort normal and breath sounds normal.  Musculoskeletal:       Lumbar back: She exhibits tenderness, pain and spasm.  Tenderness to palpation  over the bilateral flank area.  Neurological: She is alert and oriented to person, place, and time.  Psychiatric: Mood, memory, affect and judgment normal.  Nursing note and vitals reviewed.   Assessment & Plan  1. Generalized anxiety disorder We'll start on alprazolam 0.5 mg 1 tablet up to 3 times daily as needed. Patient advised to start with 1 tablet daily as needed and gradually increase to 2 tablets daily as needed. - ALPRAZolam (XANAX) 0.5 MG tablet; Take 1 tablet (0.5  mg total) by mouth 3 (three) times daily as needed for anxiety.  Dispense: 90 tablet; Refill: 0  2. Fibromyalgia Patient desires to be tested for lupus. We will order pertinent labs and follow-up. - CBC with Differential - PTT - Antinuclear Antib (ANA) - Anti-DNA antibody, double-stranded - Anti-Smith antibody  3. Cardiac murmur File to cardiology for evaluation of heart murmur. - Ambulatory referral to Cardiology  4. Cloudy urine We'll obtain urinalysis and a renal ultrasound for evaluation of patient's reports of cloudy urine and tenderness over the flank area - Urinalysis, Routine w reflex microscopic - US Renal; Future   Veronda Gabor Asad A. [facility] 05/25/2015 2:50 PM

## 2015-05-26 ENCOUNTER — Telehealth: Payer: Self-pay | Admitting: Family Medicine

## 2015-05-26 NOTE — Telephone Encounter (Signed)
Casey Hodges from [facility] received a prescription for Alprazolam Horatio Pel(Xanex), but she wanted to let you know that the patient  Is also getting Diazepam from a different doctor. She will refill the Alprazolam only if you still think it is okay. Please advise [phone]

## 2015-05-26 NOTE — Telephone Encounter (Signed)
Routed to Dr. Shah for approval 

## 2015-05-27 LAB — URINALYSIS, ROUTINE W REFLEX MICROSCOPIC
Bilirubin, UA: NEGATIVE
Glucose, UA: NEGATIVE
Ketones, UA: NEGATIVE
Leukocytes, UA: NEGATIVE
NITRITE UA: NEGATIVE
PH UA: 8 — AB (ref 5.0–7.5)
RBC UA: NEGATIVE
Specific Gravity, UA: 1.023 (ref 1.005–1.030)
UUROB: 0.2 mg/dL (ref 0.2–1.0)

## 2015-05-27 LAB — CBC WITH DIFFERENTIAL/PLATELET
BASOS: 0 %
Basophils Absolute: 0 10*3/uL (ref 0.0–0.2)
EOS (ABSOLUTE): 0.1 10*3/uL (ref 0.0–0.4)
EOS: 1 %
HEMATOCRIT: 42.2 % (ref 34.0–46.6)
HEMOGLOBIN: 14 g/dL (ref 11.1–15.9)
IMMATURE GRANS (ABS): 0 10*3/uL (ref 0.0–0.1)
IMMATURE GRANULOCYTES: 0 %
LYMPHS: 33 %
Lymphocytes Absolute: 2.8 10*3/uL (ref 0.7–3.1)
MCH: 29.6 pg (ref 26.6–33.0)
MCHC: 33.2 g/dL (ref 31.5–35.7)
MCV: 89 fL (ref 79–97)
MONOCYTES: 5 %
Monocytes Absolute: 0.4 10*3/uL (ref 0.1–0.9)
NEUTROS ABS: 5.2 10*3/uL (ref 1.4–7.0)
NEUTROS PCT: 61 %
PLATELETS: 272 10*3/uL (ref 150–379)
RBC: 4.73 x10E6/uL (ref 3.77–5.28)
RDW: 13.7 % (ref 12.3–15.4)
WBC: 8.5 10*3/uL (ref 3.4–10.8)

## 2015-05-27 LAB — ANA: Anti Nuclear Antibody(ANA): NEGATIVE

## 2015-05-27 LAB — APTT: aPTT: 26 s (ref 24–33)

## 2015-05-27 LAB — ANTI-SMITH ANTIBODY: ENA SM Ab Ser-aCnc: <0.2 AI (ref 0.0–0.9)

## 2015-05-27 LAB — ANTI-DNA ANTIBODY, DOUBLE-STRANDED

## 2015-05-27 NOTE — Telephone Encounter (Signed)
Spoke with the pharmacist who explained that patient has been taking Diazepam 2 mg 3 times daily by [facility] [place]. Last prescription was for diazepam 2 mg 3 times daily #42. She filled the first prescription on 05/02/2015. As Diazepam is not listed in her medications list. Tried to contact patient and left a voice message for her to return our call.

## 2015-06-01 ENCOUNTER — Ambulatory Visit: Admission: RE | Admit: 2015-06-01 | Payer: Medicaid Other | Source: Ambulatory Visit

## 2015-06-09 ENCOUNTER — Ambulatory Visit (INDEPENDENT_AMBULATORY_CARE_PROVIDER_SITE_OTHER): Payer: Medicaid Other | Admitting: Family Medicine

## 2015-06-09 ENCOUNTER — Encounter: Payer: Self-pay | Admitting: Family Medicine

## 2015-06-09 VITALS — BP 109/71 | HR 99 | Temp 98.8°F | Resp 14 | Ht 62.0 in | Wt 148.1 lb

## 2015-06-09 DIAGNOSIS — T50905A Adverse effect of unspecified drugs, medicaments and biological substances, initial encounter: Principal | ICD-10-CM

## 2015-06-09 DIAGNOSIS — R0683 Snoring: Secondary | ICD-10-CM | POA: Diagnosis not present

## 2015-06-09 DIAGNOSIS — R112 Nausea with vomiting, unspecified: Secondary | ICD-10-CM | POA: Diagnosis not present

## 2015-06-09 DIAGNOSIS — M797 Fibromyalgia: Secondary | ICD-10-CM

## 2015-06-09 MED ORDER — LYRICA 150 MG PO CAPS: 150.0000 mg | ORAL_CAPSULE | Freq: Two times a day (BID) | ORAL | Status: DC

## 2015-06-09 MED ORDER — PROMETHAZINE HCL 25 MG PO TABS: 25.0000 mg | ORAL_TABLET | Freq: Every day | ORAL | Status: DC | PRN

## 2015-06-09 MED ORDER — OXYCODONE-ACETAMINOPHEN 7.5-325 MG PO TABS: 1.0000 | ORAL_TABLET | Freq: Three times a day (TID) | ORAL | Status: DC | PRN

## 2015-06-09 NOTE — Progress Notes (Signed)
Name: Casey Hodges   MRN: [id number]    DOB: 1975/09/14   Date:06/09/2015       Progress Note  Subjective  Chief Complaint  Chief Complaint  Patient presents with  . Follow-up    1 mo  . Medication Refill    lyrica 150 mg / oxycodone 7.2-325 mg / promethazine      HPI  Pt. Is here for medication management and refills.  Percocet 7.5-325 mg Lyrica 150 mg Promethazine 25 mg daily. Pt. Has fibromyalgia, described as pain in knees, hips, thighs (feels bruised), low back pain, and neck pain. Symptoms are persistent but responsive to Percocet and Lyrica. No side effects reported except for constipation  She would also like to obtain a sleep study. Thinks she has sleep apnea. Her daughter has told her that she snores, sometimes she stops breathing and sometimes, her breathing sounds strange.   Past Medical History  Diagnosis Date  . Bipolar affective (HCC)     pt reported  . Uterine polyp   . Eating disorder     Under control per patient  . Renal disorder   . Painful menstrual periods   . Abnormal vaginal Pap smear     10+ years ago- no colpo repeat was normal  . Painful intercourse   . GERD (gastroesophageal reflux disease)   . VWD (acquired von Willebrand's disease) (HCC)   . Anxiety   . Spinal stenosis   . Kidney stone   . Bipolar disorder (HCC)   . AR (allergic rhinitis)   . Pelvic pain in female   . Fibromyalgia   . PTSD (post-traumatic stress disorder)     Past Surgical History  Procedure Laterality Date  . Tubal ligation    . Laparoscopy abdomen diagnostic    . Hysteroscopy      removed polyps  . Laparoscopic vaginal hysterectomy  2015    at [facility]  . Abdominal hysterectomy    . Laparoscopy Left 01/10/2015    Procedure: LAPAROSCOPY OPERATIVE with biopsy, left oopherectomy;  Surgeon: Herold Harms, MD;  Location: [facility] ORS;  Service: Gynecology;  Laterality: Left;    Family History  Problem Relation Age of Onset  . Breast cancer Paternal  Grandmother   . Diabetes Maternal Grandmother   . Hypertension Father   . Sleep apnea Mother   . Diabetes Maternal Aunt   . Diabetes Maternal Uncle     Social History   Social History  . Marital Status: Widowed    Spouse Name: N/A  . Number of Children: 1  . Years of Education: N/A   Occupational History  . Un-employed due to bipolar    Social History Main Topics  . Smoking status: Current Every Day Smoker -- 0.75 packs/day    Types: Cigarettes    Start date: 05/18/1995  . Smokeless tobacco: Never Used  . Alcohol Use: 0.0 oz/week    0 Standard drinks or equivalent per week     Comment: Socially  . Drug Use: No  . Sexual Activity: No   Other Topics Concern  . Not on file   Social History Narrative   Regular Exercise -  NO   Daily Caffeine Use:  1 cup coffee in am      1 child, one step child          Current outpatient prescriptions:  .  ALPRAZolam (XANAX) 0.5 MG tablet, Take 1 tablet (0.5 mg total) by mouth 3 (three) times daily as needed  for anxiety., Disp: 90 tablet, Rfl: 0 .  Biotin w/ Vitamins C & E (HAIR SKIN & NAILS GUMMIES) 1250-7.5-7.5 MCG-MG-UNT CHEW, Chew 1 each by mouth daily., Disp: , Rfl:  .  ibuprofen (ADVIL,MOTRIN) 800 MG tablet, Take 1 tablet (800 mg total) by mouth 3 (three) times daily., Disp: 30 tablet, Rfl: 1 .  LYRICA 150 MG capsule, Take 1 capsule (150 mg total) by mouth 2 (two) times daily., Disp: 60 capsule, Rfl: 0 .  Melatonin 10 MG SUBL, Place 10 mg under the tongue at bedtime., Disp: , Rfl:  .  oxyCODONE-acetaminophen (PERCOCET) 7.5-325 MG tablet, Take 1 tablet by mouth every 8 (eight) hours as needed for severe pain., Disp: 90 tablet, Rfl: 0 .  prazosin (MINIPRESS) 1 MG capsule, TAKE 1 DAILY FOR A WEEK AND THEN TAKE 1 TABLET 2 TIMES DAILY, Disp: , Rfl: 0 .  promethazine (PHENERGAN) 25 MG tablet, Take 1 tablet (25 mg total) by mouth daily as needed for nausea or vomiting., Disp: 30 tablet, Rfl: 0 .  traZODone (DESYREL) 150 MG tablet, Take  1 tablet by mouth at bedtime., Disp: , Rfl: 3  Allergies  Allergen Reactions  . Wellbutrin [Bupropion Hcl] Other (See Comments)    Mental breakdown? Suicidal   . Hydrocodone-Acetaminophen Nausea And Vomiting  . Nitrofurantoin Other (See Comments)  . Nitrofurantoin Monohyd Macro Other (See Comments)  . Penicillins Other (See Comments)    Yeast infections  . Vicoprofen  [Hydrocodone-Ibuprofen] Nausea And Vomiting  . Lasix [Furosemide] Rash and Other (See Comments)  . Nitrofurantoin Monohyd Macro Rash    Review of Systems  Musculoskeletal: Positive for back pain, joint pain and neck pain.  Psychiatric/Behavioral: Positive for depression. The patient is nervous/anxious and has insomnia.      Objective  Filed Vitals:   06/09/15 1415  BP: 109/71  Pulse: 99  Temp: 98.8 F (37.1 C)  TempSrc: Oral  Resp: 14  Height: 5\' 2"  (1.575 m)  Weight: 148 lb 1.6 oz (67.178 kg)  SpO2: 97%    Physical Exam  Constitutional: She is oriented to person, place, and time and well-developed, well-nourished, and in no distress.  HENT:  Head: Normocephalic and atraumatic.  Mouth/Throat: No posterior oropharyngeal erythema.  L. Tonsillar hypertrophy.  Cardiovascular: Normal rate and regular rhythm.   Murmur heard.  Systolic murmur is present with a grade of 2/6  Pulmonary/Chest: Effort normal and breath sounds normal.  Musculoskeletal:       Lumbar back: She exhibits tenderness and pain.       Back:  Neurological: She is alert and oriented to person, place, and time.  Nursing note and vitals reviewed.     Assessment & Plan  1. Drug-induced nausea and vomiting  - promethazine (PHENERGAN) 25 MG tablet; Take 1 tablet (25 mg total) by mouth daily as needed for nausea or vomiting.  Dispense: 30 tablet; Refill: 0  2. Fibromyalgia  - LYRICA 150 MG capsule; Take 1 capsule (150 mg total) by mouth 2 (two) times daily.  Dispense: 60 capsule; Refill: 0 - oxyCODONE-acetaminophen (PERCOCET)  7.5-325 MG tablet; Take 1 tablet by mouth every 8 (eight) hours as needed for severe pain.  Dispense: 90 tablet; Refill: 0  3. Snoring  - Ambulatory referral to Sleep Studies   Rastus Borton Asad A. Faylene KurtzShah [facility] 06/09/2015 2:38 PM

## 2015-06-21 ENCOUNTER — Other Ambulatory Visit: Payer: Self-pay | Admitting: Family Medicine

## 2015-06-21 DIAGNOSIS — F411 Generalized anxiety disorder: Secondary | ICD-10-CM

## 2015-06-21 NOTE — Telephone Encounter (Signed)
Pt needs refill on Alprazolam.

## 2015-06-22 MED ORDER — ALPRAZOLAM 0.5 MG PO TABS: 0.5000 mg | ORAL_TABLET | Freq: Three times a day (TID) | ORAL | Status: DC | PRN

## 2015-06-22 NOTE — Telephone Encounter (Signed)
Routed to Dr. Shah for approval 

## 2015-07-11 ENCOUNTER — Ambulatory Visit (INDEPENDENT_AMBULATORY_CARE_PROVIDER_SITE_OTHER): Payer: Medicaid Other | Admitting: Family Medicine

## 2015-07-11 ENCOUNTER — Encounter: Payer: Self-pay | Admitting: Family Medicine

## 2015-07-11 VITALS — BP 112/80 | HR 102 | Temp 98.0°F | Resp 19 | Ht 62.0 in | Wt 155.7 lb

## 2015-07-11 DIAGNOSIS — R112 Nausea with vomiting, unspecified: Secondary | ICD-10-CM | POA: Diagnosis not present

## 2015-07-11 DIAGNOSIS — J069 Acute upper respiratory infection, unspecified: Secondary | ICD-10-CM

## 2015-07-11 DIAGNOSIS — F411 Generalized anxiety disorder: Secondary | ICD-10-CM

## 2015-07-11 DIAGNOSIS — M797 Fibromyalgia: Secondary | ICD-10-CM | POA: Diagnosis not present

## 2015-07-11 DIAGNOSIS — T50905A Adverse effect of unspecified drugs, medicaments and biological substances, initial encounter: Principal | ICD-10-CM

## 2015-07-11 MED ORDER — PROMETHAZINE HCL 25 MG PO TABS: 25.0000 mg | ORAL_TABLET | Freq: Every day | ORAL | Status: DC | PRN

## 2015-07-11 MED ORDER — LYRICA 150 MG PO CAPS: 150.0000 mg | ORAL_CAPSULE | Freq: Two times a day (BID) | ORAL | Status: DC

## 2015-07-11 MED ORDER — OXYCODONE-ACETAMINOPHEN 7.5-325 MG PO TABS: 1.0000 | ORAL_TABLET | Freq: Three times a day (TID) | ORAL | Status: DC | PRN

## 2015-07-11 MED ORDER — ALPRAZOLAM 0.5 MG PO TABS: 0.5000 mg | ORAL_TABLET | Freq: Three times a day (TID) | ORAL | Status: DC | PRN

## 2015-07-11 NOTE — Progress Notes (Signed)
Name: Casey Hodges   MRN: [id number]    DOB: [birth year]   Date:07/11/2015       Progress Note  Subjective  Chief Complaint  Chief Complaint  Patient presents with  . Follow-up    1 mo  . Medication Refill    Anxiety Presents for follow-up visit. The problem has been gradually improving. Symptoms include excessive worry, nausea, nervous/anxious behavior and panic. Patient reports no insomnia. Symptoms occur most days.   Past treatments include benzodiazephines. Compliance with prior treatments has been good.  Cough This is a new problem. The current episode started yesterday (2 days ago). The cough is productive of sputum. Associated symptoms include chills, myalgias, nasal congestion, postnasal drip and a sore throat. Pertinent negatives include no fever or headaches. She has tried nothing for the symptoms.   Fibromylagia Patient has generalized musculoskeletal pain. This is present mainly in her legs, lower back, neck, knees, and hip pain. She is on Percocet 7.5-325 mg every 8 hours as needed along with Lyrica 150 mg twice daily,which helps relieve her pain. No obvious adverse effects.  Past Medical History  Diagnosis Date  . Bipolar affective (HCC)     pt reported  . Uterine polyp   . Eating disorder     Under control per patient  . Renal disorder   . Painful menstrual periods   . Abnormal vaginal Pap smear     10+ years ago- no colpo repeat was normal  . Painful intercourse   . GERD (gastroesophageal reflux disease)   . VWD (acquired von Willebrand's disease) (HCC)   . Anxiety   . Spinal stenosis   . Kidney stone   . Bipolar disorder (HCC)   . AR (allergic rhinitis)   . Pelvic pain in female   . Fibromyalgia   . PTSD (post-traumatic stress disorder)     Past Surgical History  Procedure Laterality Date  . Tubal ligation    . Laparoscopy abdomen diagnostic    . Hysteroscopy      removed polyps  . Laparoscopic vaginal hysterectomy  2015    at [facility]  .  Abdominal hysterectomy    . Laparoscopy Left 01/10/2015    Procedure: LAPAROSCOPY OPERATIVE with biopsy, left oopherectomy;  Surgeon: Herold Harms, MD;  Location: [facility] ORS;  Service: Gynecology;  Laterality: Left;    Family History  Problem Relation Age of Onset  . Breast cancer Paternal Grandmother   . Diabetes Maternal Grandmother   . Hypertension Father   . Sleep apnea Mother   . Diabetes Maternal Aunt   . Diabetes Maternal Uncle     Social History   Social History  . Marital Status: Widowed    Spouse Name: N/A  . Number of Children: 1  . Years of Education: N/A   Occupational History  . Un-employed due to bipolar    Social History Main Topics  . Smoking status: Current Every Day Smoker -- 0.75 packs/day    Types: Cigarettes    Start date: 05/18/1995  . Smokeless tobacco: Never Used  . Alcohol Use: 0.0 oz/week    0 Standard drinks or equivalent per week     Comment: Socially  . Drug Use: No  . Sexual Activity: No   Other Topics Concern  . Not on file   Social History Narrative   Regular Exercise -  NO   Daily Caffeine Use:  1 cup coffee in am      1 child, one step child  Current outpatient prescriptions:  .  ALPRAZolam (XANAX) 0.5 MG tablet, Take 1 tablet (0.5 mg total) by mouth 3 (three) times daily as needed for anxiety., Disp: 90 tablet, Rfl: 0 .  Biotin w/ Vitamins C & E (HAIR SKIN & NAILS GUMMIES) 1250-7.5-7.5 MCG-MG-UNT CHEW, Chew 1 each by mouth daily., Disp: , Rfl:  .  ibuprofen (ADVIL,MOTRIN) 800 MG tablet, Take 1 tablet (800 mg total) by mouth 3 (three) times daily., Disp: 30 tablet, Rfl: 1 .  LYRICA 150 MG capsule, Take 1 capsule (150 mg total) by mouth 2 (two) times daily., Disp: 60 capsule, Rfl: 0 .  Melatonin 10 MG SUBL, Place 10 mg under the tongue at bedtime., Disp: , Rfl:  .  oxyCODONE-acetaminophen (PERCOCET) 7.5-325 MG tablet, Take 1 tablet by mouth every 8 (eight) hours as needed for severe pain., Disp: 90 tablet, Rfl:  0 .  prazosin (MINIPRESS) 1 MG capsule, TAKE 1 DAILY FOR A WEEK AND THEN TAKE 1 TABLET 2 TIMES DAILY, Disp: , Rfl: 0 .  promethazine (PHENERGAN) 25 MG tablet, Take 1 tablet (25 mg total) by mouth daily as needed for nausea or vomiting., Disp: 30 tablet, Rfl: 0 .  SAPHRIS 10 MG SUBL, Take 1 tablet by mouth at bedtime., Disp: , Rfl: 2 .  traZODone (DESYREL) 150 MG tablet, Take 1 tablet by mouth at bedtime., Disp: , Rfl: 3  Allergies  Allergen Reactions  . Wellbutrin [Bupropion Hcl] Other (See Comments)    Mental breakdown? Suicidal   . Hydrocodone-Acetaminophen Nausea And Vomiting  . Nitrofurantoin Other (See Comments)  . Nitrofurantoin Monohyd Macro Other (See Comments)  . Penicillins Other (See Comments)    Yeast infections  . Vicoprofen  [Hydrocodone-Ibuprofen] Nausea And Vomiting  . Lasix [Furosemide] Rash and Other (See Comments)  . Nitrofurantoin Monohyd Macro Rash     Review of Systems  Constitutional: Positive for chills. Negative for fever.  HENT: Positive for postnasal drip and sore throat.   Respiratory: Positive for cough.   Gastrointestinal: Positive for nausea and vomiting.  Musculoskeletal: Positive for myalgias, back pain, joint pain and neck pain.  Neurological: Negative for headaches.  Psychiatric/Behavioral: Positive for depression. The patient is nervous/anxious. The patient does not have insomnia.      Objective  Filed Vitals:   07/11/15 1447  BP: 112/80  Pulse: 102  Temp: 98 F (36.7 C)  TempSrc: Oral  Resp: 19  Height: 5\' 2"  (1.575 m)  Weight: 155 lb 11.2 oz (70.625 kg)  SpO2: 98%    Physical Exam  Constitutional: She is well-developed, well-nourished, and in no distress.  HENT:  Head: Normocephalic and atraumatic.  Right Ear: Tympanic membrane and ear canal normal.  Left Ear: Tympanic membrane and ear canal normal.  Nose: Right sinus exhibits no maxillary sinus tenderness and no frontal sinus tenderness. Left sinus exhibits no maxillary sinus  tenderness and no frontal sinus tenderness.  Mouth/Throat: Posterior oropharyngeal erythema present. No oropharyngeal exudate.  Tonsillar hypertrophy.  Neck: Neck supple.  Cardiovascular: Normal rate, regular rhythm and normal heart sounds.   Pulmonary/Chest: Effort normal and breath sounds normal.  Musculoskeletal:       Right hip: She exhibits tenderness.       Left hip: She exhibits tenderness.       Right knee: Tenderness found.       Left knee: Tenderness found.       Cervical back: She exhibits tenderness.       Back:  Nursing note and vitals reviewed.  Assessment & Plan  1. Drug-induced nausea and vomiting Likely secondary to opioids. Responsive to Phenergan. Continue present therapy. - promethazine (PHENERGAN) 25 MG tablet; Take 1 tablet (25 mg total) by mouth daily as needed for nausea or vomiting.  Dispense: 30 tablet; Refill: 0  2. Fibromyalgia Stable and responsive to Percocet and Lyrica. Refills provided and follow-up in one month. - LYRICA 150 MG capsule; Take 1 capsule (150 mg total) by mouth 2 (two) times daily.  Dispense: 60 capsule; Refill: 0 - oxyCODONE-acetaminophen (PERCOCET) 7.5-325 MG tablet; Take 1 tablet by mouth every 8 (eight) hours as needed for severe pain.  Dispense: 90 tablet; Refill: 0  3. Generalized anxiety disorder Symptoms of anxiety are responsive to alprazolam taken up to 3 times daily as needed. Patient aware of the dependence potential, side effects, and drug interactions of benzodiazepines. Refills provided and follow-up in one month - ALPRAZolam (XANAX) 0.5 MG tablet; Take 1 tablet (0.5 mg total) by mouth 3 (three) times daily as needed for anxiety.  Dispense: 90 tablet; Refill: 0  4. Upper respiratory infection Likely viral. Symptomatic treatment advised.  Marx Doig Asad A. Faylene KurtzShah [facility] 07/11/2015 2:57 PM

## 2015-07-12 DIAGNOSIS — J069 Acute upper respiratory infection, unspecified: Secondary | ICD-10-CM | POA: Insufficient documentation

## 2015-07-24 ENCOUNTER — Emergency Department: Payer: Medicaid Other

## 2015-07-24 ENCOUNTER — Emergency Department
Admission: EM | Admit: 2015-07-24 | Discharge: 2015-07-25 | Disposition: A | Discharge status: Home / Self Care | Payer: Medicaid Other | Attending: Emergency Medicine | Admitting: Emergency Medicine

## 2015-07-24 DIAGNOSIS — Z880 Allergy status to penicillin: Secondary | ICD-10-CM | POA: Insufficient documentation

## 2015-07-24 DIAGNOSIS — Z791 Long term (current) use of non-steroidal anti-inflammatories (NSAID): Secondary | ICD-10-CM | POA: Insufficient documentation

## 2015-07-24 DIAGNOSIS — Z79899 Other long term (current) drug therapy: Secondary | ICD-10-CM | POA: Diagnosis not present

## 2015-07-24 DIAGNOSIS — J209 Acute bronchitis, unspecified: Secondary | ICD-10-CM | POA: Diagnosis not present

## 2015-07-24 DIAGNOSIS — R197 Diarrhea, unspecified: Secondary | ICD-10-CM | POA: Diagnosis not present

## 2015-07-24 DIAGNOSIS — E876 Hypokalemia: Secondary | ICD-10-CM | POA: Insufficient documentation

## 2015-07-24 DIAGNOSIS — F17210 Nicotine dependence, cigarettes, uncomplicated: Secondary | ICD-10-CM | POA: Diagnosis not present

## 2015-07-24 DIAGNOSIS — R0602 Shortness of breath: Secondary | ICD-10-CM | POA: Diagnosis present

## 2015-07-24 LAB — CBC
HEMATOCRIT: 44.5 % (ref 35.0–47.0)
Hemoglobin: 14.9 g/dL (ref 12.0–16.0)
MCH: 29.6 pg (ref 26.0–34.0)
MCHC: 33.5 g/dL (ref 32.0–36.0)
MCV: 88.4 fL (ref 80.0–100.0)
Platelets: 286 10*3/uL (ref 150–440)
RBC: 5.03 MIL/uL (ref 3.80–5.20)
RDW: 13.4 % (ref 11.5–14.5)
WBC: 8.7 10*3/uL (ref 3.6–11.0)

## 2015-07-24 LAB — BASIC METABOLIC PANEL
Anion gap: 8 (ref 5–15)
BUN: 16 mg/dL (ref 6–20)
CHLORIDE: 108 mmol/L (ref 101–111)
CO2: 21 mmol/L — ABNORMAL LOW (ref 22–32)
CREATININE: 0.81 mg/dL (ref 0.44–1.00)
Calcium: 9.5 mg/dL (ref 8.9–10.3)
GFR calc Af Amer: >60 mL/min (ref >60)
GFR calc non Af Amer: >60 mL/min (ref >60)
GLUCOSE: 94 mg/dL (ref 65–99)
Potassium: 3.4 mmol/L — ABNORMAL LOW (ref 3.5–5.1)
SODIUM: 137 mmol/L (ref 135–145)

## 2015-07-24 LAB — MONONUCLEOSIS SCREEN: Mono Screen: NEGATIVE

## 2015-07-24 LAB — TROPONIN I: Troponin I: <0.03 ng/mL (ref <0.031)

## 2015-07-24 MED ORDER — POTASSIUM CHLORIDE CRYS ER 20 MEQ PO TBCR
10.0000 meq | EXTENDED_RELEASE_TABLET | Freq: Once | ORAL | Status: AC
  Administered 2015-07-24: 10 meq via ORAL
  Filled 2015-07-24: qty 1

## 2015-07-24 MED ORDER — ONDANSETRON 4 MG PO TBDP
4.0000 mg | ORAL_TABLET | Freq: Once | ORAL | Status: AC
  Administered 2015-07-24: 4 mg via ORAL
  Filled 2015-07-24: qty 1

## 2015-07-24 MED ORDER — IPRATROPIUM-ALBUTEROL 0.5-2.5 (3) MG/3ML IN SOLN
3.0000 mL | Freq: Once | RESPIRATORY_TRACT | Status: AC
  Administered 2015-07-24: 3 mL via RESPIRATORY_TRACT
  Filled 2015-07-24: qty 3

## 2015-07-24 MED ORDER — ONDANSETRON HCL 4 MG PO TABS: 4.0000 mg | ORAL_TABLET | Freq: Three times a day (TID) | ORAL | Status: DC | PRN

## 2015-07-24 MED ORDER — SODIUM CHLORIDE 0.9 % IV BOLUS (SEPSIS)
1000.0000 mL | Freq: Once | INTRAVENOUS | Status: AC
  Administered 2015-07-24: 1000 mL via INTRAVENOUS

## 2015-07-24 MED ORDER — PREDNISONE 20 MG PO TABS
60.0000 mg | ORAL_TABLET | Freq: Once | ORAL | Status: AC
  Administered 2015-07-24: 60 mg via ORAL
  Filled 2015-07-24: qty 3

## 2015-07-24 MED ORDER — PREDNISONE 10 MG PO TABS: ORAL_TABLET | ORAL | Status: DC

## 2015-07-24 NOTE — Discharge Instructions (Signed)
Your exam and evaluation are reassuring in the emergency department. You still have wheezing due to bronchospasm caused by acute bronchitis likely a virus. Return to the emergency room for any worsening condition including trouble breathing, vomiting or diarrhea with concern for dehydration, dizziness or passing out, chest pain or trouble breathing, or any other symptoms concerning to you.  Acute Bronchitis Bronchitis is inflammation of the airways that extend from the windpipe into the lungs (bronchi). The inflammation often causes mucus to develop. This leads to a cough, which is the most common symptom of bronchitis.  In acute bronchitis, the condition usually develops suddenly and goes away over time, usually in a couple weeks. Smoking, allergies, and asthma can make bronchitis worse. Repeated episodes of bronchitis may cause further lung problems.  CAUSES Acute bronchitis is most often caused by the same virus that causes a cold. The virus can spread from person to person (contagious) through coughing, sneezing, and touching contaminated objects. SIGNS AND SYMPTOMS   Cough.   Fever.   Coughing up mucus.   Body aches.   Chest congestion.   Chills.   Shortness of breath.   Sore throat.  DIAGNOSIS  Acute bronchitis is usually diagnosed through a physical exam. Your health care provider will also ask you questions about your medical history. Tests, such as chest X-rays, are sometimes done to rule out other conditions.  TREATMENT  Acute bronchitis usually goes away in a couple weeks. Oftentimes, no medical treatment is necessary. Medicines are sometimes given for relief of fever or cough. Antibiotic medicines are usually not needed but may be prescribed in certain situations. In some cases, an inhaler may be recommended to help reduce shortness of breath and control the cough. A cool mist vaporizer may also be used to help thin bronchial secretions and make it easier to clear the  chest.  HOME CARE INSTRUCTIONS  Get plenty of rest.   Drink enough fluids to keep your urine clear or pale yellow (unless you have a medical condition that requires fluid restriction). Increasing fluids may help thin your respiratory secretions (sputum) and reduce chest congestion, and it will prevent dehydration.   Take medicines only as directed by your health care provider.  If you were prescribed an antibiotic medicine, finish it all even if you start to feel better.  Avoid smoking and secondhand smoke. Exposure to cigarette smoke or irritating chemicals will make bronchitis worse. If you are a smoker, consider using nicotine gum or skin patches to help control withdrawal symptoms. Quitting smoking will help your lungs heal faster.   Reduce the chances of another bout of acute bronchitis by washing your hands frequently, avoiding people with cold symptoms, and trying not to touch your hands to your mouth, nose, or eyes.   Keep all follow-up visits as directed by your health care provider.  SEEK MEDICAL CARE IF: Your symptoms do not improve after 1 week of treatment.  SEEK IMMEDIATE MEDICAL CARE IF:  You develop an increased fever or chills.   You have chest pain.   You have severe shortness of breath.  You have bloody sputum.   You develop dehydration.  You faint or repeatedly feel like you are going to pass out.  You develop repeated vomiting.  You develop a severe headache. MAKE SURE YOU:   Understand these instructions.  Will watch your condition.  Will get help right away if you are not doing well or get worse.   This information is not intended to  replace advice given to you by your health care provider. Make sure you discuss any questions you have with your health care provider.   Document Released: 07/19/2004 Document Revised: 07/02/2014 Document Reviewed: 12/02/2012 Elsevier Interactive Patient Education 2016 [employer].  Hypokalemia Hypokalemia  means that the amount of potassium in the blood is lower than normal.Potassium is a chemical, called an electrolyte, that helps regulate the amount of fluid in the body. It also stimulates muscle contraction and helps nerves function properly.Most of the body's potassium is inside of cells, and only a very small amount is in the blood. Because the amount in the blood is so small, minor changes can be life-threatening. CAUSES  Antibiotics.  Diarrhea or vomiting.  Using laxatives too much, which can cause diarrhea.  Chronic kidney disease.  Water pills (diuretics).  Eating disorders (bulimia).  Low magnesium level.  Sweating a lot. SIGNS AND SYMPTOMS  Weakness.  Constipation.  Fatigue.  Muscle cramps.  Mental confusion.  Skipped heartbeats or irregular heartbeat (palpitations).  Tingling or numbness. DIAGNOSIS  Your health care provider can diagnose hypokalemia with blood tests. In addition to checking your potassium level, your health care provider may also check other lab tests. TREATMENT Hypokalemia can be treated with potassium supplements taken by mouth or adjustments in your current medicines. If your potassium level is very low, you may need to get potassium through a vein (IV) and be monitored in the hospital. A diet high in potassium is also helpful. Foods high in potassium are:  Nuts, such as peanuts and pistachios.  Seeds, such as sunflower seeds and pumpkin seeds.  Peas, lentils, and lima beans.  Whole grain and bran cereals and breads.  Fresh fruit and vegetables, such as apricots, avocado, bananas, cantaloupe, kiwi, oranges, tomatoes, asparagus, and potatoes.  Orange and tomato juices.  Red meats.  Fruit yogurt. HOME CARE INSTRUCTIONS  Take all medicines as prescribed by your health care provider.  Maintain a healthy diet by including nutritious food, such as fruits, vegetables, nuts, whole grains, and lean meats.  If you are taking a laxative,  be sure to follow the directions on the label. SEEK MEDICAL CARE IF:  Your weakness gets worse.  You feel your heart pounding or racing.  You are vomiting or having diarrhea.  You are diabetic and having trouble keeping your blood glucose in the normal range. SEEK IMMEDIATE MEDICAL CARE IF:  You have chest pain, shortness of breath, or dizziness.  You are vomiting or having diarrhea for more than 2 days.  You faint. MAKE SURE YOU:   Understand these instructions.  Will watch your condition.  Will get help right away if you are not doing well or get worse.   This information is not intended to replace advice given to you by your health care provider. Make sure you discuss any questions you have with your health care provider.   Document Released: 06/11/2005 Document Revised: 07/02/2014 Document Reviewed: 12/12/2012 Elsevier Interactive Patient Education [employer]

## 2015-07-24 NOTE — ED Notes (Signed)
Pt not in room or lobby 

## 2015-07-24 NOTE — ED Notes (Signed)
Called and spoke with pt regarding d/c instructions, flu and rx; will leave instructions and rx in labeled envelope and front desk for pt to p/u; pt voices good understanding

## 2015-07-24 NOTE — ED Provider Notes (Addendum)
[facility] Emergency Department Provider Note   ____________________________________________  Time seen:  I have reviewed the triage vital signs and the triage nursing note.  HISTORY  Chief Complaint Shortness of Breath   Historian Patient  HPI Casey Hodges is a 40 y.o. female with a history of recent upper respiratory infection/acute bronchitis. She's had symptoms for about a week. She did take a Z-Pak course as well as a 5 day taper of prednisone. She has been on albuterol for wheezing. She doesn't have a history wheezing, except for this illness.  Intermittent chills.  Decreased by mouth intake with some nausea. She has had a few episodes of nonbloody diarrhea. No known sick contacts or bad food. She thinks the diarrhea may have been due to recent antibiotic use.    Past Medical History  Diagnosis Date  . Bipolar affective (HCC)     pt reported  . Uterine polyp   . Eating disorder     Under control per patient  . Renal disorder   . Painful menstrual periods   . Abnormal vaginal Pap smear     10+ years ago- no colpo repeat was normal  . Painful intercourse   . GERD (gastroesophageal reflux disease)   . VWD (acquired von Willebrand's disease) (HCC)   . Anxiety   . Spinal stenosis   . Kidney stone   . Bipolar disorder (HCC)   . AR (allergic rhinitis)   . Pelvic pain in female   . Fibromyalgia   . PTSD (post-traumatic stress disorder)     Patient Active Problem List   Diagnosis Date Noted  . Upper respiratory infection 07/12/2015  . Snoring 06/09/2015  . Cardiac murmur 05/25/2015  . Cloudy urine 05/25/2015  . Numbness of foot 04/20/2015  . Drug-induced nausea and vomiting 03/22/2015  . Need for immunization against influenza 03/21/2015  . Cyst, bone 02/14/2015  . Pelvic adhesive disease 01/21/2015  . Tick bite 01/20/2015  . Arthritis 12/19/2014  . Calculus of kidney 12/13/2014  . Patchy loss of hair 12/13/2014  . Anxiety disorder  12/13/2014  . Postural dizziness 12/13/2014  . Subcutaneous cyst 12/13/2014  . Nephrolithiasis 05/23/2012  . Chronic pelvic pain in female 05/23/2012  . Fibromyalgia 05/23/2012  . Bipolar disorder with depression (HCC) 06/05/2011    Past Surgical History  Procedure Laterality Date  . Tubal ligation    . Laparoscopy abdomen diagnostic    . Hysteroscopy      removed polyps  . Laparoscopic vaginal hysterectomy  2015    at [facility]  . Abdominal hysterectomy    . Laparoscopy Left 01/10/2015    Procedure: LAPAROSCOPY OPERATIVE with biopsy, left oopherectomy;  Surgeon: Herold Harms, MD;  Location: [facility] ORS;  Service: Gynecology;  Laterality: Left;    Current Outpatient Rx  Name  Route  Sig  Dispense  Refill  . ALPRAZolam (XANAX) 0.5 MG tablet   Oral   Take 1 tablet (0.5 mg total) by mouth 3 (three) times daily as needed for anxiety.   90 tablet   0   . Biotin w/ Vitamins C & E (HAIR SKIN & NAILS GUMMIES) 1250-7.5-7.5 MCG-MG-UNT CHEW   Oral   Chew 1 each by mouth daily.         Marland Kitchen ibuprofen (ADVIL,MOTRIN) 800 MG tablet   Oral   Take 1 tablet (800 mg total) by mouth 3 (three) times daily.   30 tablet   1   . LYRICA 150 MG capsule  Oral   Take 1 capsule (150 mg total) by mouth 2 (two) times daily.   60 capsule   0     Dispense as written.   . Melatonin 10 MG SUBL   Sublingual   Place 10 mg under the tongue at bedtime.         . ondansetron (ZOFRAN) 4 MG tablet   Oral   Take 1 tablet (4 mg total) by mouth every 8 (eight) hours as needed for nausea or vomiting.   10 tablet   0   . oxyCODONE-acetaminophen (PERCOCET) 7.5-325 MG tablet   Oral   Take 1 tablet by mouth every 8 (eight) hours as needed for severe pain.   90 tablet   0   . prazosin (MINIPRESS) 1 MG capsule      TAKE 1 DAILY FOR A WEEK AND THEN TAKE 1 TABLET 2 TIMES DAILY      0   . predniSONE (DELTASONE) 10 MG tablet      60 mg daily 4 days 50 mg daily times one day and 40 mg daily  1 day 30 mg daily 1 day 20 mg daily 1 day 10 mg daily 1 day   39 tablet   0   . promethazine (PHENERGAN) 25 MG tablet   Oral   Take 1 tablet (25 mg total) by mouth daily as needed for nausea or vomiting.   30 tablet   0   . SAPHRIS 10 MG SUBL   Oral   Take 1 tablet by mouth at bedtime.      2     Dispense as written.   . traZODone (DESYREL) 150 MG tablet   Oral   Take 1 tablet by mouth at bedtime.      3     Allergies Wellbutrin; Hydrocodone-acetaminophen; Nitrofurantoin; Nitrofurantoin monohyd macro; Penicillins; Vicoprofen ; Lasix; and Nitrofurantoin monohyd macro  Family History  Problem Relation Age of Onset  . Breast cancer Paternal Grandmother   . Diabetes Maternal Grandmother   . Hypertension Father   . Sleep apnea Mother   . Diabetes Maternal Aunt   . Diabetes Maternal Uncle     Social History Social History  Substance Use Topics  . Smoking status: Current Every Day Smoker -- 0.75 packs/day    Types: Cigarettes    Start date: 05/18/1995  . Smokeless tobacco: Never Used  . Alcohol Use: 0.0 oz/week    0 Standard drinks or equivalent per week     Comment: Socially    Review of Systems  Constitutional: Subjective fever and chills. Eyes: Negative for visual changes. ENT: Negative for sore throat. Cardiovascular: Negative for chest pain. Respiratory: Positive for shortness of breath and cough. Gastrointestinal: Negative for abdominal pain. Genitourinary: Negative for dysuria. Musculoskeletal: Negative for back pain. Skin: Negative for rash. Neurological: Negative for headache. 10 point Review of Systems otherwise negative ____________________________________________   PHYSICAL EXAM:  VITAL SIGNS: ED Triage Vitals  Enc Vitals Group     BP 07/24/15 1502 116/75 mmHg     Pulse Rate 07/24/15 1502 94     Resp 07/24/15 1502 20     Temp 07/24/15 1502 97.6 F (36.4 C)     Temp Source 07/24/15 1854 Oral     SpO2 07/24/15 1502 100 %      Weight 07/24/15 1502 145 lb (65.772 kg)     Height 07/24/15 1502  (1.575 m)     Head Cir --  Peak Flow --      Pain Score 07/24/15 1456 7     Pain Loc --      Pain Edu? --      Excl. in GC? --      Constitutional: Alert and oriented. Well appearing and in no distress. Eyes: Conjunctivae are normal. PERRL. Normal extraocular movements. ENT   Head: Normocephalic and atraumatic.   Nose: No congestion/rhinnorhea.   Mouth/Throat: Mucous membranes are moist.   Neck: No stridor. Cardiovascular/Chest: Normal rate, regular rhythm.  No murmurs, rubs, or gallops. Respiratory: Normal respiratory effort without tachypnea nor retractions. Moderate expiratory wheezing and mild rhonchi posteriorly bilaterally. Gastrointestinal: Soft. No distention, no guarding, no rebound. Nontender.    Genitourinary/rectal:Deferred Musculoskeletal: Nontender with normal range of motion in all extremities. No joint effusions.  No lower extremity tenderness.  No edema. Neurologic:  Normal speech and language. No gross or focal neurologic deficits are appreciated. Skin:  Skin is warm, dry and intact. No rash noted. Psychiatric: Mood and affect are normal. Speech and behavior are normal. Patient exhibits appropriate insight and judgment.  ____________________________________________   EKG I, Governor Rooks, MD, the attending physician have personally viewed and interpreted all ECGs.  70 bpm. Normal sinus rhythm. Narrow QRS. Normal axis. Normal ST and T-wave ____________________________________________  LABS (pertinent positives/negatives)  Basic metabolic panel without significant abnormalities CBC within normal limits Troponin less than 0.03 Mono negative  ____________________________________________  RADIOLOGY All Xrays were viewed by me. Imaging interpreted by Radiologist.  Chest x-ray two-view: No active cardiopulmonary  disease __________________________________________  PROCEDURES  Procedure(s) performed: None  Critical Care performed: None  ____________________________________________   ED COURSE / ASSESSMENT AND PLAN  Pertinent labs & imaging results that were available during my care of the patient were reviewed by me and considered in my medical decision making (see chart for details).    Patient still complaining of feeling overall miserable with URI symptoms plus bronchospasm. I suspect she is still having symptoms of viral acute bronchitis. No indication of other bacterial infection including no elevated white blood cell count, fever documented, or infiltrate on chest x-ray. I am going to put her back on a longer taper of prednisone to help with the bronchospasm.  I will discharge with nausea medications Zofran when necessary. Patient is overall well-appearing, but did ask for IV fluids for decreased by mouth intake.    CONSULTATIONS:  None   Patient / Family / Caregiver informed of clinical course, medical decision-making process, and agree with plan.   I discussed return precautions, follow-up instructions, and discharged instructions with patient and/or family.  ----------------------------------------- 7:55 PM on 07/24/2015 -----------------------------------------  When I went to discuss final return precautions with the patient after IV fluids, she had eloped. Patient left without discharge instructions or prescriptions.  ___________________________________________   FINAL CLINICAL IMPRESSION(S) / ED DIAGNOSES   Final diagnoses:  Acute bronchitis, unspecified organism  Hypokalemia              Note: This dictation was prepared with Dragon dictation. Any transcriptional errors that result from this process are unintentional   Governor Rooks, MD 07/24/15 1954  Governor Rooks, MD 07/24/15 [phone]

## 2015-07-24 NOTE — ED Notes (Signed)
Pt currently taking medication for bronchitis and pneumonia since last Tuesday, Multiple episodes of diarrhea and vomiting, hot and cold chills and SOB/

## 2015-07-25 ENCOUNTER — Encounter: Payer: Self-pay | Admitting: Cardiovascular Disease

## 2015-07-25 ENCOUNTER — Ambulatory Visit (INDEPENDENT_AMBULATORY_CARE_PROVIDER_SITE_OTHER): Payer: Medicaid Other | Admitting: Cardiovascular Disease

## 2015-07-25 VITALS — BP 98/70 | HR 99 | Ht 62.0 in | Wt 149.5 lb

## 2015-07-25 DIAGNOSIS — I9589 Other hypotension: Secondary | ICD-10-CM | POA: Diagnosis not present

## 2015-07-25 DIAGNOSIS — R011 Cardiac murmur, unspecified: Secondary | ICD-10-CM

## 2015-07-25 DIAGNOSIS — J069 Acute upper respiratory infection, unspecified: Secondary | ICD-10-CM | POA: Diagnosis not present

## 2015-07-25 DIAGNOSIS — Z720 Tobacco use: Secondary | ICD-10-CM

## 2015-07-25 DIAGNOSIS — I959 Hypotension, unspecified: Secondary | ICD-10-CM | POA: Insufficient documentation

## 2015-07-25 DIAGNOSIS — F17200 Nicotine dependence, unspecified, uncomplicated: Secondary | ICD-10-CM

## 2015-07-25 NOTE — Assessment & Plan Note (Signed)
She feels better after going to the emergency room yesterday, prednisone taper continued, given IV fluids, nebulizer treatment. Blood pressure running low, encourage her to increase her fluid intake

## 2015-07-25 NOTE — Assessment & Plan Note (Signed)
We have encouraged her to continue to work on weaning her cigarettes and smoking cessation. She will continue to work on this and does not want any assistance with chantix.  

## 2015-07-25 NOTE — Patient Instructions (Addendum)
You are doing well. No medication changes were made.  Please call if you are concerned about a murmur Echo can be performed  Please call us if you have new issues that need to be addressed before your next appt.    Echocardiogram An echocardiogram, or echocardiography, uses sound waves (ultrasound) to produce an image of your heart. The echocardiogram is simple, painless, obtained within a short period of time, and offers valuable information to your health care provider. The images from an echocardiogram can provide information such as:  Evidence of coronary artery disease (CAD).  Heart size.  Heart muscle function.  Heart valve function.  Aneurysm detection.  Evidence of a past heart attack.  Fluid buildup around the heart.  Heart muscle thickening.  Assess heart valve function. LET [facility] CARE PROVIDER KNOW ABOUT:  Any allergies you have.  All medicines you are taking, including vitamins, herbs, eye drops, creams, and over-the-counter medicines.  Previous problems you or members of your family have had with the use of anesthetics.  Any blood disorders you have.  Previous surgeries you have had.  Medical conditions you have.  Possibility of pregnancy, if this applies. BEFORE THE PROCEDURE  No special preparation is needed. Eat and drink normally.  PROCEDURE   In order to produce an image of your heart, gel will be applied to your chest and a wand-like tool (transducer) will be moved over your chest. The gel will help transmit the sound waves from the transducer. The sound waves will harmlessly bounce off your heart to allow the heart images to be captured in real-time motion. These images will then be recorded.  You may need an IV to receive a medicine that improves the quality of the pictures. AFTER THE PROCEDURE You may return to your normal schedule including diet, activities, and medicines, unless your health care provider tells you otherwise.   This  information is not intended to replace advice given to you by your health care provider. Make sure you discuss any questions you have with your health care provider.   Document Released: 06/08/2000 Document Revised: 07/02/2014 Document Reviewed: 02/16/2013 Elsevier Interactive Patient Education [employer]

## 2015-07-25 NOTE — Assessment & Plan Note (Signed)
We listened closely on exam today for murmur, none was appreciated. Unclear if this is secondary to change in fluid status as she has been running dry through her recent bronchitis. She did receive IV fluids yesterday. Grossly no significant valve disease suggested given her benign exam We did offer echocardiogram if she is concerned about murmur. She prefers to not do echocardiogram at this time which I feel is safe as she has normal EKG, no symptoms of shortness of breath or other cardiac issues and essentially benign exam. If she changes her mind and would like echocardiogram, this could be ordered with a phone call to our office

## 2015-07-25 NOTE — Progress Notes (Signed)
Patient ID: Casey Hodges, female    DOB: 19-Nov-1975, 40 y.o.   MRN: [id number]  HPI Comments: Casey Hodges a pleasant 40 year old woman with long history of smoking from age 40 up to 40 years old, continues to smoke, recent bronchitis, seen in the emergency room yesterday given IV fluids and prednisone taper, who presents by referral from Dr. Sherryll Burger for evaluation of heart murmur.  In general she reports that she is still recovering from her bronchitis, feels better after fluids, breathing treatment, steroids Has had a difficult time for the past week or so with the infection  Denies any lightheadedness or dizziness, no orthostasis symptoms Reports being told that she has a murmur by Dr. Sherryll Burger,  Denies any shortness of breath at baseline, no exercise intolerance  She reports having issues over the past year, lost her husband suddenly, dealing with his loss Some chronic pain issues  EKG done in the emergency room yesterday shows normal sinus rhythm with rate 78 bpm, no significant ST or T-wave changes   Allergies  Allergen Reactions  . Wellbutrin [Bupropion Hcl] Other (See Comments)    Mental breakdown? Suicidal   . Hydrocodone-Acetaminophen Nausea And Vomiting  . Nitrofurantoin Other (See Comments)  . Nitrofurantoin Monohyd Macro Other (See Comments)  . Penicillins Other (See Comments)    Yeast infections  . Vicoprofen  [Hydrocodone-Ibuprofen] Nausea And Vomiting  . Lasix [Furosemide] Rash and Other (See Comments)  . Nitrofurantoin Monohyd Macro Rash    Current Outpatient Prescriptions on File Prior to Visit  Medication Sig Dispense Refill  . ALPRAZolam (XANAX) 0.5 MG tablet Take 1 tablet (0.5 mg total) by mouth 3 (three) times daily as needed for anxiety. 90 tablet 0  . Biotin w/ Vitamins C & E (HAIR SKIN & NAILS GUMMIES) 1250-7.5-7.5 MCG-MG-UNT CHEW Chew 1 each by mouth daily.    Marland Kitchen ibuprofen (ADVIL,MOTRIN) 800 MG tablet Take 1 tablet (800 mg total) by mouth 3 (three) times daily.  30 tablet 1  . LYRICA 150 MG capsule Take 1 capsule (150 mg total) by mouth 2 (two) times daily. 60 capsule 0  . Melatonin 10 MG SUBL Place 10 mg under the tongue at bedtime.    . ondansetron (ZOFRAN) 4 MG tablet Take 1 tablet (4 mg total) by mouth every 8 (eight) hours as needed for nausea or vomiting. 10 tablet 0  . oxyCODONE-acetaminophen (PERCOCET) 7.5-325 MG tablet Take 1 tablet by mouth every 8 (eight) hours as needed for severe pain. 90 tablet 0  . promethazine (PHENERGAN) 25 MG tablet Take 1 tablet (25 mg total) by mouth daily as needed for nausea or vomiting. 30 tablet 0   No current facility-administered medications on file prior to visit.    Past Medical History  Diagnosis Date  . Bipolar affective (HCC)     pt reported  . Uterine polyp   . Eating disorder     Under control per patient  . Renal disorder   . Painful menstrual periods   . Abnormal vaginal Pap smear     10+ years ago- no colpo repeat was normal  . Painful intercourse   . GERD (gastroesophageal reflux disease)   . VWD (acquired von Willebrand's disease) (HCC)   . Anxiety   . Spinal stenosis   . Kidney stone   . Bipolar disorder (HCC)   . AR (allergic rhinitis)   . Pelvic pain in female   . Fibromyalgia   . PTSD (post-traumatic stress disorder)  Past Surgical History  Procedure Laterality Date  . Tubal ligation    . Laparoscopy abdomen diagnostic    . Hysteroscopy      removed polyps  . Laparoscopic vaginal hysterectomy  2015    at [facility]  . Abdominal hysterectomy    . Laparoscopy Left 01/10/2015    Procedure: LAPAROSCOPY OPERATIVE with biopsy, left oopherectomy;  Surgeon: Herold Harms, MD;  Location: [facility] ORS;  Service: Gynecology;  Laterality: Left;    Social History  reports that she has been smoking Cigarettes.  She started smoking about 20 years ago. She has been smoking about 0.75 packs per day. She has never used smokeless tobacco. She reports that she drinks alcohol. She  reports that she does not use illicit drugs.  Family History family history includes Breast cancer in her paternal grandmother; Diabetes in her maternal aunt, maternal grandmother, and maternal uncle; Hypertension in her father; Sleep apnea in her mother.   Review of Systems  Constitutional: Negative.   Respiratory: Positive for cough and wheezing.   Cardiovascular: Negative.   Gastrointestinal: Negative.   Musculoskeletal: Negative.   Neurological: Negative.   Hematological: Negative.   Psychiatric/Behavioral: Negative.   All other systems reviewed and are negative.   BP 98/70 mmHg  Pulse 99  Ht  (1.575 m)  Wt 149 lb 8 oz (67.813 kg)  BMI 27.34 kg/m2  LMP 10/29/2013  Physical Exam  Constitutional: She is oriented to person, place, and time. She appears well-developed and well-nourished.  HENT:  Head: Normocephalic.  Nose: Nose normal.  Mouth/Throat: Oropharynx is clear and moist.  Eyes: Conjunctivae are normal. Pupils are equal, round, and reactive to light.  Neck: Normal range of motion. Neck supple. No JVD present.  Cardiovascular: Normal rate, regular rhythm, normal heart sounds and intact distal pulses.  Exam reveals no gallop and no friction rub.   No murmur heard. Pulmonary/Chest: Effort normal and breath sounds normal. No respiratory distress. She has no wheezes. She has no rales. She exhibits no tenderness.  Abdominal: Soft. Bowel sounds are normal. She exhibits no distension. There is no tenderness.  Musculoskeletal: Normal range of motion. She exhibits no edema or tenderness.  Lymphadenopathy:    She has no cervical adenopathy.  Neurological: She is alert and oriented to person, place, and time. Coordination normal.  Skin: Skin is warm and dry. No rash noted. No erythema.  Psychiatric: She has a normal mood and affect. Her behavior is normal. Judgment and thought content normal.

## 2015-07-25 NOTE — Assessment & Plan Note (Signed)
She reports that her blood pressure typically runs low. Systolic pressure 90 on today's visit, possibly exacerbated from her medications or from underlying infection. Encouraged her to increase her fluid intake

## 2015-07-27 DIAGNOSIS — F4310 Post-traumatic stress disorder, unspecified: Secondary | ICD-10-CM | POA: Diagnosis not present

## 2015-08-10 DIAGNOSIS — F4310 Post-traumatic stress disorder, unspecified: Secondary | ICD-10-CM | POA: Diagnosis not present

## 2015-08-11 ENCOUNTER — Ambulatory Visit (INDEPENDENT_AMBULATORY_CARE_PROVIDER_SITE_OTHER): Payer: Medicaid Other | Admitting: Family Medicine

## 2015-08-11 ENCOUNTER — Encounter: Payer: Self-pay | Admitting: Family Medicine

## 2015-08-11 VITALS — BP 100/71 | HR 114 | Temp 98.5°F | Resp 20 | Ht 62.0 in | Wt 151.4 lb

## 2015-08-11 DIAGNOSIS — T50905A Adverse effect of unspecified drugs, medicaments and biological substances, initial encounter: Principal | ICD-10-CM

## 2015-08-11 DIAGNOSIS — F411 Generalized anxiety disorder: Secondary | ICD-10-CM

## 2015-08-11 DIAGNOSIS — R112 Nausea with vomiting, unspecified: Secondary | ICD-10-CM | POA: Diagnosis not present

## 2015-08-11 DIAGNOSIS — M797 Fibromyalgia: Secondary | ICD-10-CM | POA: Diagnosis not present

## 2015-08-11 MED ORDER — LYRICA 150 MG PO CAPS: 150.0000 mg | ORAL_CAPSULE | Freq: Two times a day (BID) | ORAL | Status: DC

## 2015-08-11 MED ORDER — OXYCODONE-ACETAMINOPHEN 7.5-325 MG PO TABS: 1.0000 | ORAL_TABLET | Freq: Three times a day (TID) | ORAL | Status: DC | PRN

## 2015-08-11 MED ORDER — ALPRAZOLAM 0.5 MG PO TABS: 0.5000 mg | ORAL_TABLET | Freq: Three times a day (TID) | ORAL | Status: DC | PRN

## 2015-08-11 MED ORDER — PROMETHAZINE HCL 25 MG PO TABS: 25.0000 mg | ORAL_TABLET | Freq: Every day | ORAL | Status: DC | PRN

## 2015-08-11 NOTE — Progress Notes (Signed)
Name: Casey Hodges   MRN: [id number]    DOB: Oct 27, 1975   Date:08/11/2015       Progress Note  Subjective  Chief Complaint  Chief Complaint  Patient presents with  . Follow-up    1 mo  . Medication Refill    xanax / lyrica / oxycodone / promethazine    Anxiety Presents for follow-up visit. The problem has been unchanged. Symptoms include excessive worry, irritability, nausea, nervous/anxious behavior, panic and shortness of breath. Patient reports no chest pain. The severity of symptoms is moderate.   Her past medical history is significant for anxiety/panic attacks. Past treatments include benzodiazephines.   Fibromyalgia Pt. Has fibromyalgia, characterized by generalized musculoskeletal aches, especially in knees, back, and neck. Takes Oxycodone-Acetaminophen 7.5-325 mg three times daily as needed, along with Lyrica 150 mg twice a day.  Opioid-Induced Nausea Occasional nausea (3-4x/week), mostly triggered by Percocet, she takes Promethazine to relieve her symptoms.  Past Medical History  Diagnosis Date  . Bipolar affective (HCC)     pt reported  . Uterine polyp   . Eating disorder     Under control per patient  . Renal disorder   . Painful menstrual periods   . Abnormal vaginal Pap smear     10+ years ago- no colpo repeat was normal  . Painful intercourse   . GERD (gastroesophageal reflux disease)   . VWD (acquired von Willebrand's disease) (HCC)   . Anxiety   . Spinal stenosis   . Kidney stone   . Bipolar disorder (HCC)   . AR (allergic rhinitis)   . Pelvic pain in female   . Fibromyalgia   . PTSD (post-traumatic stress disorder)     Past Surgical History  Procedure Laterality Date  . Tubal ligation    . Laparoscopy abdomen diagnostic    . Hysteroscopy      removed polyps  . Laparoscopic vaginal hysterectomy  2015    at [facility]  . Abdominal hysterectomy    . Laparoscopy Left 01/10/2015    Procedure: LAPAROSCOPY OPERATIVE with biopsy, left oopherectomy;   Surgeon: Herold Harms, MD;  Location: [facility] ORS;  Service: Gynecology;  Laterality: Left;    Family History  Problem Relation Age of Onset  . Breast cancer Paternal Grandmother   . Diabetes Maternal Grandmother   . Hypertension Father   . Sleep apnea Mother   . Diabetes Maternal Aunt   . Diabetes Maternal Uncle     Social History   Social History  . Marital Status: Widowed    Spouse Name: N/A  . Number of Children: 1  . Years of Education: N/A   Occupational History  . Un-employed due to bipolar    Social History Main Topics  . Smoking status: Current Every Day Smoker -- 0.75 packs/day    Types: Cigarettes    Start date: 05/18/1995  . Smokeless tobacco: Never Used  . Alcohol Use: 0.0 oz/week    0 Standard drinks or equivalent per week     Comment: Socially  . Drug Use: No  . Sexual Activity: No   Other Topics Concern  . Not on file   Social History Narrative   Regular Exercise -  NO   Daily Caffeine Use:  1 cup coffee in am      1 child, one step child           Current outpatient prescriptions:  .  ALPRAZolam (XANAX) 0.5 MG tablet, Take 1 tablet (0.5 mg total)  by mouth 3 (three) times daily as needed for anxiety., Disp: 90 tablet, Rfl: 0 .  Asenapine Maleate (SAPHRIS) 10 MG SUBL, Place 1 tablet under the tongue at bedtime., Disp: , Rfl:  .  Biotin w/ Vitamins C & E (HAIR SKIN & NAILS GUMMIES) 1250-7.5-7.5 MCG-MG-UNT CHEW, Chew 1 each by mouth daily., Disp: , Rfl:  .  ibuprofen (ADVIL,MOTRIN) 800 MG tablet, Take 1 tablet (800 mg total) by mouth 3 (three) times daily., Disp: 30 tablet, Rfl: 1 .  LYRICA 150 MG capsule, Take 1 capsule (150 mg total) by mouth 2 (two) times daily., Disp: 60 capsule, Rfl: 0 .  Melatonin 10 MG SUBL, Place 10 mg under the tongue at bedtime., Disp: , Rfl:  .  ondansetron (ZOFRAN) 4 MG tablet, Take 1 tablet (4 mg total) by mouth every 8 (eight) hours as needed for nausea or vomiting., Disp: 10 tablet, Rfl: 0 .   oxyCODONE-acetaminophen (PERCOCET) 7.5-325 MG tablet, Take 1 tablet by mouth every 8 (eight) hours as needed for severe pain., Disp: 90 tablet, Rfl: 0 .  promethazine (PHENERGAN) 25 MG tablet, Take 1 tablet (25 mg total) by mouth daily as needed for nausea or vomiting., Disp: 30 tablet, Rfl: 0 .  zolpidem (AMBIEN) 10 MG tablet, Take 10 mg by mouth at bedtime. Reported on 08/11/2015, Disp: , Rfl: 2  Allergies  Allergen Reactions  . Wellbutrin [Bupropion Hcl] Other (See Comments)    Mental breakdown? Suicidal   . Hydrocodone-Acetaminophen Nausea And Vomiting  . Nitrofurantoin Other (See Comments)  . Nitrofurantoin Monohyd Macro Other (See Comments)  . Penicillins Other (See Comments)    Yeast infections  . Vicoprofen  [Hydrocodone-Ibuprofen] Nausea And Vomiting  . Lasix [Furosemide] Rash and Other (See Comments)  . Nitrofurantoin Monohyd Macro Rash     Review of Systems  Constitutional: Positive for irritability.  Respiratory: Positive for shortness of breath.   Cardiovascular: Negative for chest pain.  Gastrointestinal: Positive for nausea. Negative for vomiting.  Musculoskeletal: Positive for myalgias, back pain, joint pain and neck pain.  Psychiatric/Behavioral: Positive for depression (sees a Therapist, sports in [place]). The patient is nervous/anxious.      Objective  Filed Vitals:   08/11/15 1546  BP: 100/71  Pulse: 114  Temp: 98.5 F (36.9 C)  TempSrc: Oral  Resp: 20  Height: 5\' 2"  (1.575 m)  Weight: 151 lb 6.4 oz (68.675 kg)  SpO2: 98%    Physical Exam  Constitutional: She is oriented to person, place, and time and well-developed, well-nourished, and in no distress.  HENT:  Head: Normocephalic and atraumatic.  Cardiovascular: Normal rate and regular rhythm.   Pulmonary/Chest: Effort normal and breath sounds normal.  Musculoskeletal:       Right knee: Tenderness found.       Left knee: Tenderness found.       Lumbar back: She exhibits pain and spasm.        Back:  Neurological: She is alert and oriented to person, place, and time.  Psychiatric: Memory, affect and judgment normal.  Nursing note and vitals reviewed.       Assessment & Plan  1. Drug-induced nausea and vomiting  - promethazine (PHENERGAN) 25 MG tablet; Take 1 tablet (25 mg total) by mouth daily as needed for nausea or vomiting.  Dispense: 30 tablet; Refill: 0  2. Fibromyalgia Symptoms stable and controlled on Lyrica and Percocet - LYRICA 150 MG capsule; Take 1 capsule (150 mg total) by mouth 2 (two) times daily.  Dispense: 60  capsule; Refill: 0 - oxyCODONE-acetaminophen (PERCOCET) 7.5-325 MG tablet; Take 1 tablet by mouth every 8 (eight) hours as needed for severe pain.  Dispense: 90 tablet; Refill: 0  3. Generalized anxiety disorder  - ALPRAZolam (XANAX) 0.5 MG tablet; Take 1 tablet (0.5 mg total) by mouth 3 (three) times daily as needed for anxiety.  Dispense: 90 tablet; Refill: 0   Aryav Wimberly Asad A. [facility] 08/11/2015 4:09 PM

## 2015-08-21 IMAGING — CR DG TOE 5TH LEFT
1 series · 3 of 3 positions shown · non-contrast
Comparison: Left foot series [id number].

CLINICAL DATA: 38-year-old female status post blunt trauma to the
fifth digit versus would not table. Discoloration and pain. Initial
encounter.

EXAM:
DG TOE 5TH LEFT

[Series 1: ap · 0.17mm/px · 3 of 3 slices shown]
[im 1/3]
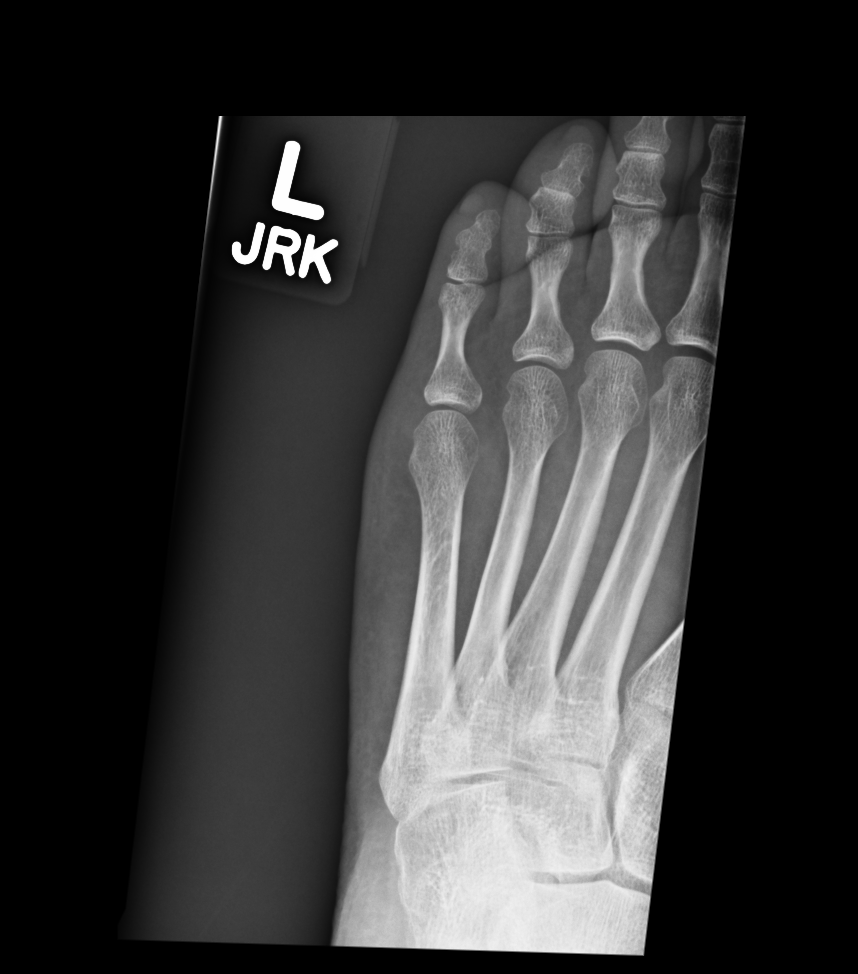
[im 2/3]
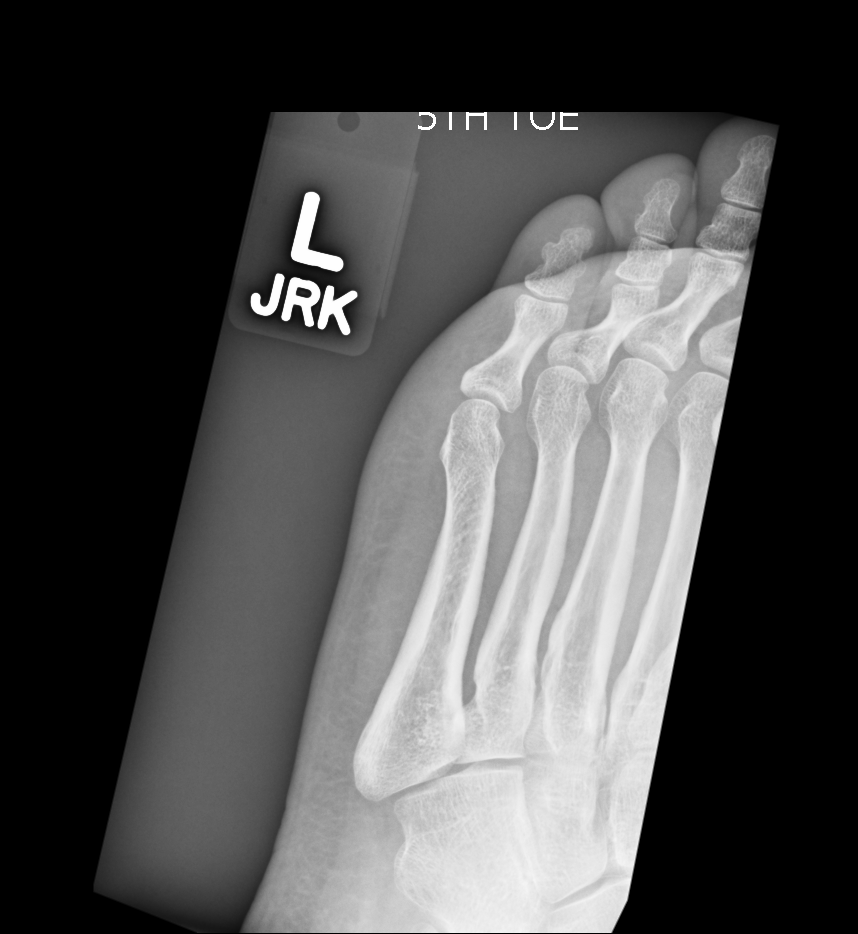
[im 3/3]
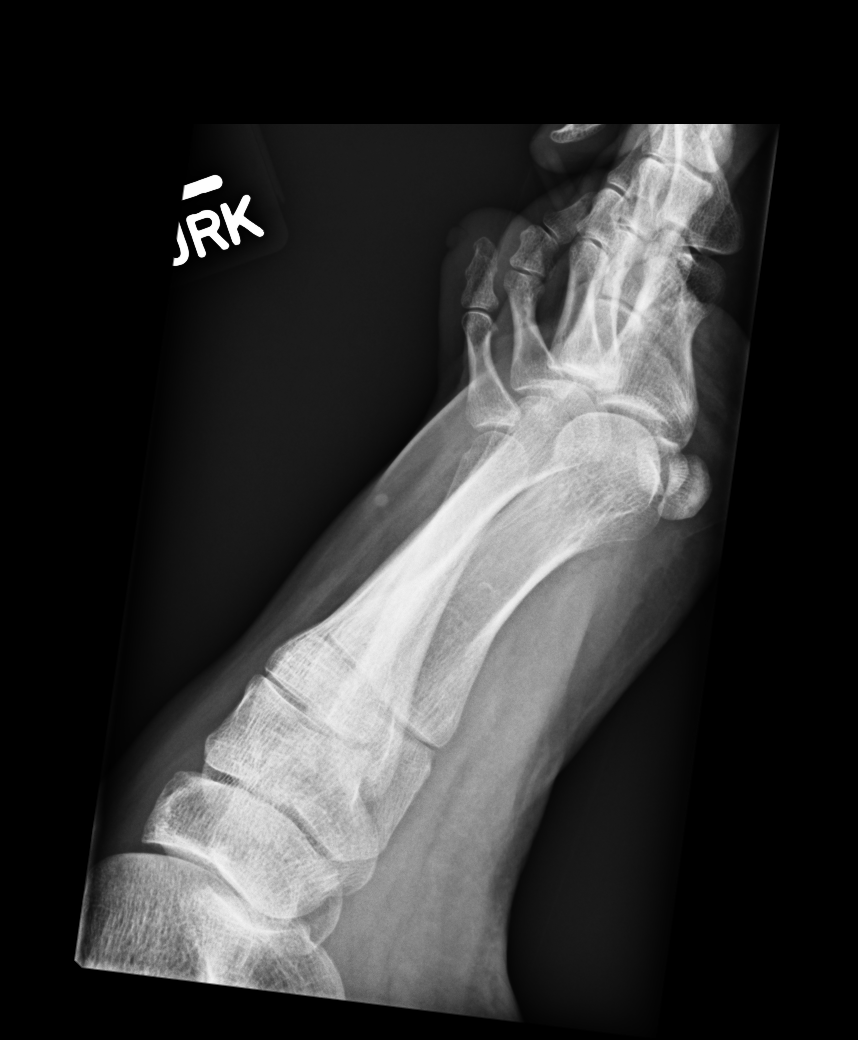

[3 of 3 positions shown; findings below may reference images not displayed]

FINDINGS: Bone mineralization is within normal limits. Incidental fused left
fifth middle and distal phalanges as before. No left fifth phalanx
fracture or dislocation identified. Joint spaces and alignment
preserved. Other visualized osseous structures are intact.
IMPRESSION: No acute fracture or dislocation identified about the left fifth
toe.

## 2015-08-26 DIAGNOSIS — F4310 Post-traumatic stress disorder, unspecified: Secondary | ICD-10-CM | POA: Diagnosis not present

## 2015-09-08 ENCOUNTER — Encounter: Payer: Self-pay | Admitting: Family Medicine

## 2015-09-08 ENCOUNTER — Ambulatory Visit (INDEPENDENT_AMBULATORY_CARE_PROVIDER_SITE_OTHER): Payer: Medicaid Other | Admitting: Family Medicine

## 2015-09-08 VITALS — BP 100/67 | HR 99 | Temp 98.3°F | Resp 16 | Ht 62.0 in | Wt 152.9 lb

## 2015-09-08 DIAGNOSIS — T50905A Adverse effect of unspecified drugs, medicaments and biological substances, initial encounter: Secondary | ICD-10-CM

## 2015-09-08 DIAGNOSIS — R112 Nausea with vomiting, unspecified: Secondary | ICD-10-CM

## 2015-09-08 DIAGNOSIS — M797 Fibromyalgia: Secondary | ICD-10-CM | POA: Diagnosis not present

## 2015-09-08 DIAGNOSIS — F411 Generalized anxiety disorder: Secondary | ICD-10-CM | POA: Diagnosis not present

## 2015-09-08 DIAGNOSIS — M542 Cervicalgia: Secondary | ICD-10-CM | POA: Diagnosis not present

## 2015-09-08 MED ORDER — LYRICA 150 MG PO CAPS: 150.0000 mg | ORAL_CAPSULE | Freq: Two times a day (BID) | ORAL | Status: DC

## 2015-09-08 MED ORDER — PROMETHAZINE HCL 25 MG PO TABS: 25.0000 mg | ORAL_TABLET | Freq: Every day | ORAL | Status: DC | PRN

## 2015-09-08 MED ORDER — OXYCODONE-ACETAMINOPHEN 7.5-325 MG PO TABS: 1.0000 | ORAL_TABLET | Freq: Three times a day (TID) | ORAL | Status: DC | PRN

## 2015-09-08 MED ORDER — ALPRAZOLAM 0.5 MG PO TABS: 0.5000 mg | ORAL_TABLET | Freq: Three times a day (TID) | ORAL | Status: DC | PRN

## 2015-09-08 NOTE — Progress Notes (Signed)
Name: Casey Hodges   MRN: [id number]    DOB: 1976-03-27   Date:09/08/2015       Progress Note  Subjective  Chief Complaint  Chief Complaint  Patient presents with  . Follow-up    1 mo  . Medication Refill    HPI  Anxiety: Symptoms include worrying, nervous, sweaty palms, and feeling uneasy. Takes Alprazolam 0.5mg  three times daily as needed. Anxiety higher this week with the anniversary of her husband's death.  Fibromyalgia: Generalized musculoskeletal aches and pains, most prominent in knees, lower back, and neck. She takes Lyrica and Percocet which helps relieve her pain to some extent. Overall pain is rated at 6/10 today. No side effects reported from Lyrica.   Opioid-Induced Nausea: Nausea mostly from Perocet plus anxiety. No vomiting. Takes Promethazine 25 gm once daily as needed for nausea.    Past Medical History  Diagnosis Date  . Bipolar affective (HCC)     pt reported  . Uterine polyp   . Eating disorder     Under control per patient  . Renal disorder   . Painful menstrual periods   . Abnormal vaginal Pap smear     10+ years ago- no colpo repeat was normal  . Painful intercourse   . GERD (gastroesophageal reflux disease)   . VWD (acquired von Willebrand's disease) (HCC)   . Anxiety   . Spinal stenosis   . Kidney stone   . Bipolar disorder (HCC)   . AR (allergic rhinitis)   . Pelvic pain in female   . Fibromyalgia   . PTSD (post-traumatic stress disorder)     Past Surgical History  Procedure Laterality Date  . Tubal ligation    . Laparoscopy abdomen diagnostic    . Hysteroscopy      removed polyps  . Laparoscopic vaginal hysterectomy  2015    at [facility]  . Abdominal hysterectomy    . Laparoscopy Left 01/10/2015    Procedure: LAPAROSCOPY OPERATIVE with biopsy, left oopherectomy;  Surgeon: Herold Harms, MD;  Location: [facility] ORS;  Service: Gynecology;  Laterality: Left;    Family History  Problem Relation Age of Onset  . Breast cancer  Paternal Grandmother   . Diabetes Maternal Grandmother   . Hypertension Father   . Sleep apnea Mother   . Diabetes Maternal Aunt   . Diabetes Maternal Uncle     Social History   Social History  . Marital Status: Widowed    Spouse Name: N/A  . Number of Children: 1  . Years of Education: N/A   Occupational History  . Un-employed due to bipolar    Social History Main Topics  . Smoking status: Current Every Day Smoker -- 0.75 packs/day    Types: Cigarettes    Start date: 05/18/1995  . Smokeless tobacco: Never Used  . Alcohol Use: 0.0 oz/week    0 Standard drinks or equivalent per week     Comment: Socially  . Drug Use: No  . Sexual Activity: No   Other Topics Concern  . Not on file   Social History Narrative   Regular Exercise -  NO   Daily Caffeine Use:  1 cup coffee in am      1 child, one step child           Current outpatient prescriptions:  .  ALPRAZolam (XANAX) 0.5 MG tablet, Take 1 tablet (0.5 mg total) by mouth 3 (three) times daily as needed for anxiety., Disp: 90 tablet, Rfl:  0 .  Asenapine Maleate (SAPHRIS) 10 MG SUBL, Place 1 tablet under the tongue at bedtime., Disp: , Rfl:  .  Biotin w/ Vitamins C & E (HAIR SKIN & NAILS GUMMIES) 1250-7.5-7.5 MCG-MG-UNT CHEW, Chew 1 each by mouth daily., Disp: , Rfl:  .  ibuprofen (ADVIL,MOTRIN) 800 MG tablet, Take 1 tablet (800 mg total) by mouth 3 (three) times daily., Disp: 30 tablet, Rfl: 1 .  LYRICA 150 MG capsule, Take 1 capsule (150 mg total) by mouth 2 (two) times daily., Disp: 60 capsule, Rfl: 0 .  Melatonin 10 MG SUBL, Place 10 mg under the tongue at bedtime., Disp: , Rfl:  .  ondansetron (ZOFRAN) 4 MG tablet, Take 1 tablet (4 mg total) by mouth every 8 (eight) hours as needed for nausea or vomiting., Disp: 10 tablet, Rfl: 0 .  oxyCODONE-acetaminophen (PERCOCET) 7.5-325 MG tablet, Take 1 tablet by mouth every 8 (eight) hours as needed for severe pain., Disp: 90 tablet, Rfl: 0 .  promethazine (PHENERGAN) 25 MG  tablet, Take 1 tablet (25 mg total) by mouth daily as needed for nausea or vomiting., Disp: 30 tablet, Rfl: 0 .  zolpidem (AMBIEN) 10 MG tablet, Take 10 mg by mouth at bedtime. Reported on 08/11/2015, Disp: , Rfl: 2  Allergies  Allergen Reactions  . Wellbutrin [Bupropion Hcl] Other (See Comments)    Mental breakdown? Suicidal   . Hydrocodone-Acetaminophen Nausea And Vomiting  . Nitrofurantoin Other (See Comments)  . Nitrofurantoin Monohyd Macro Other (See Comments)  . Penicillins Other (See Comments)    Yeast infections  . Vicoprofen  [Hydrocodone-Ibuprofen] Nausea And Vomiting  . Lasix [Furosemide] Rash and Other (See Comments)  . Nitrofurantoin Monohyd Macro Rash     Review of Systems  Gastrointestinal: Positive for nausea. Negative for vomiting.  Musculoskeletal: Positive for back pain, joint pain and neck pain.  Psychiatric/Behavioral: Positive for depression. The patient is nervous/anxious and has insomnia.      Objective  Filed Vitals:   09/08/15 1550  BP: 100/67  Pulse: 99  Temp: 98.3 F (36.8 C)  TempSrc: Oral  Resp: 16  Height:  (1.575 m)  Weight: 152 lb 14.4 oz (69.355 kg)  SpO2: 97%    Physical Exam  Constitutional: She is oriented to person, place, and time and well-developed, well-nourished, and in no distress.  Cardiovascular: Normal rate and regular rhythm.   Pulmonary/Chest: Effort normal and breath sounds normal.  Musculoskeletal:       Right knee: She exhibits normal range of motion and no swelling. No tenderness found.       Left knee: She exhibits normal range of motion and no swelling. No tenderness found.       Back:  Neurological: She is alert and oriented to person, place, and time.  Nursing note and vitals reviewed.   Assessment & Plan  1. Fibromyalgia Symptoms responsive to Lyrica and Percocet. Compliant with controlled substances agreement. Refills provided - LYRICA 150 MG capsule; Take 1 capsule (150 mg total) by mouth 2 (two)  times daily.  Dispense: 60 capsule; Refill: 0 - oxyCODONE-acetaminophen (PERCOCET) 7.5-325 MG tablet; Take 1 tablet by mouth every 8 (eight) hours as needed for severe pain.  Dispense: 90 tablet; Refill: 0  2. Drug-induced nausea and vomiting  - promethazine (PHENERGAN) 25 MG tablet; Take 1 tablet (25 mg total) by mouth daily as needed for nausea or vomiting.  Dispense: 30 tablet; Refill: 0  3. Generalized anxiety disorder m helps relieve her symptoms of anxiety. Refills  provided - ALPRAZolam (XANAX) 0.5 MG tablet; Take 1 tablet (0.5 mg total) by mouth 3 (three) times daily as needed for anxiety.  Dispense: 90 tablet; Refill: 0  4. Cervical spine pain  - DG Cervical Spine Complete; Future   Katara Griner Asad A. [facility]aylene [facility] 09/08/2015 4:05 PM

## 2015-09-09 ENCOUNTER — Ambulatory Visit
Admission: RE | Admit: 2015-09-09 | Discharge: 2015-09-09 | Disposition: A | Discharge status: Home / Self Care | Payer: Medicare Other | Source: Ambulatory Visit | Attending: Surgical Oncology | Admitting: Surgical Oncology

## 2015-09-09 ENCOUNTER — Ambulatory Visit
Admission: RE | Admit: 2015-09-09 | Discharge: 2015-09-09 | Disposition: A | Discharge status: Home / Self Care | Payer: Medicare Other | Source: Ambulatory Visit | Attending: Family Medicine | Admitting: Family Medicine

## 2015-09-09 DIAGNOSIS — M542 Cervicalgia: Secondary | ICD-10-CM | POA: Diagnosis not present

## 2015-09-12 DIAGNOSIS — F319 Bipolar disorder, unspecified: Secondary | ICD-10-CM | POA: Diagnosis not present

## 2015-09-12 DIAGNOSIS — F3132 Bipolar disorder, current episode depressed, moderate: Secondary | ICD-10-CM | POA: Diagnosis not present

## 2015-09-12 DIAGNOSIS — F4310 Post-traumatic stress disorder, unspecified: Secondary | ICD-10-CM | POA: Diagnosis not present

## 2015-09-15 DIAGNOSIS — F4310 Post-traumatic stress disorder, unspecified: Secondary | ICD-10-CM | POA: Diagnosis not present

## 2015-09-16 ENCOUNTER — Inpatient Hospital Stay
Admission: EM | Admit: 2015-09-16 | Discharge: 2015-09-18 | DRG: 872 | Discharge status: Against Medical Advice | Payer: Medicare Other | Attending: Internal Medicine | Admitting: Internal Medicine

## 2015-09-16 ENCOUNTER — Emergency Department: Payer: Medicare Other

## 2015-09-16 ENCOUNTER — Encounter: Payer: Self-pay | Admitting: Emergency Medicine

## 2015-09-16 DIAGNOSIS — R410 Disorientation, unspecified: Secondary | ICD-10-CM | POA: Diagnosis not present

## 2015-09-16 DIAGNOSIS — F4310 Post-traumatic stress disorder, unspecified: Secondary | ICD-10-CM | POA: Diagnosis present

## 2015-09-16 DIAGNOSIS — Z79899 Other long term (current) drug therapy: Secondary | ICD-10-CM | POA: Diagnosis not present

## 2015-09-16 DIAGNOSIS — R220 Localized swelling, mass and lump, head: Secondary | ICD-10-CM | POA: Diagnosis not present

## 2015-09-16 DIAGNOSIS — F1310 Sedative, hypnotic or anxiolytic abuse, uncomplicated: Secondary | ICD-10-CM | POA: Diagnosis present

## 2015-09-16 DIAGNOSIS — A419 Sepsis, unspecified organism: Principal | ICD-10-CM | POA: Diagnosis present

## 2015-09-16 DIAGNOSIS — F3130 Bipolar disorder, current episode depressed, mild or moderate severity, unspecified: Secondary | ICD-10-CM | POA: Diagnosis not present

## 2015-09-16 DIAGNOSIS — F411 Generalized anxiety disorder: Secondary | ICD-10-CM | POA: Diagnosis present

## 2015-09-16 DIAGNOSIS — Z880 Allergy status to penicillin: Secondary | ICD-10-CM

## 2015-09-16 DIAGNOSIS — L03211 Cellulitis of face: Secondary | ICD-10-CM | POA: Diagnosis present

## 2015-09-16 DIAGNOSIS — K219 Gastro-esophageal reflux disease without esophagitis: Secondary | ICD-10-CM | POA: Diagnosis present

## 2015-09-16 DIAGNOSIS — R400 Somnolence: Secondary | ICD-10-CM | POA: Diagnosis present

## 2015-09-16 DIAGNOSIS — M797 Fibromyalgia: Secondary | ICD-10-CM | POA: Diagnosis present

## 2015-09-16 DIAGNOSIS — F332 Major depressive disorder, recurrent severe without psychotic features: Secondary | ICD-10-CM | POA: Diagnosis present

## 2015-09-16 DIAGNOSIS — R42 Dizziness and giddiness: Secondary | ICD-10-CM | POA: Diagnosis not present

## 2015-09-16 DIAGNOSIS — F17210 Nicotine dependence, cigarettes, uncomplicated: Secondary | ICD-10-CM | POA: Diagnosis present

## 2015-09-16 DIAGNOSIS — G8929 Other chronic pain: Secondary | ICD-10-CM | POA: Diagnosis present

## 2015-09-16 DIAGNOSIS — F319 Bipolar disorder, unspecified: Secondary | ICD-10-CM | POA: Diagnosis present

## 2015-09-16 DIAGNOSIS — Z885 Allergy status to narcotic agent status: Secondary | ICD-10-CM

## 2015-09-16 DIAGNOSIS — F413 Other mixed anxiety disorders: Secondary | ICD-10-CM | POA: Diagnosis not present

## 2015-09-16 DIAGNOSIS — Z881 Allergy status to other antibiotic agents status: Secondary | ICD-10-CM

## 2015-09-16 DIAGNOSIS — J309 Allergic rhinitis, unspecified: Secondary | ICD-10-CM | POA: Diagnosis present

## 2015-09-16 DIAGNOSIS — Z888 Allergy status to other drugs, medicaments and biological substances status: Secondary | ICD-10-CM | POA: Diagnosis not present

## 2015-09-16 DIAGNOSIS — F419 Anxiety disorder, unspecified: Secondary | ICD-10-CM | POA: Diagnosis not present

## 2015-09-16 LAB — URINE DRUG SCREEN, QUALITATIVE (ARMC ONLY)
Amphetamines, Ur Screen: NOT DETECTED
Barbiturates, Ur Screen: NOT DETECTED
Benzodiazepine, Ur Scrn: POSITIVE — AB
CANNABINOID 50 NG, UR ~~LOC~~: NOT DETECTED
COCAINE METABOLITE, UR ~~LOC~~: NOT DETECTED
MDMA (ECSTASY) UR SCREEN: NOT DETECTED
Methadone Scn, Ur: NOT DETECTED
Opiate, Ur Screen: POSITIVE — AB
PHENCYCLIDINE (PCP) UR S: NOT DETECTED
Tricyclic, Ur Screen: NOT DETECTED

## 2015-09-16 LAB — CBC WITH DIFFERENTIAL/PLATELET
Basophils Absolute: 0 10*3/uL (ref 0–0.1)
Basophils Relative: 0 %
EOS ABS: 0.1 10*3/uL (ref 0–0.7)
EOS PCT: 1 %
HCT: 38.1 % (ref 35.0–47.0)
HEMOGLOBIN: 12.8 g/dL (ref 12.0–16.0)
LYMPHS ABS: 3.1 10*3/uL (ref 1.0–3.6)
Lymphocytes Relative: 25 %
MCH: 30.4 pg (ref 26.0–34.0)
MCHC: 33.5 g/dL (ref 32.0–36.0)
MCV: 90.7 fL (ref 80.0–100.0)
MONOS PCT: 7 %
Monocytes Absolute: 0.8 10*3/uL (ref 0.2–0.9)
Neutro Abs: 8.3 10*3/uL — ABNORMAL HIGH (ref 1.4–6.5)
Neutrophils Relative %: 67 %
PLATELETS: 179 10*3/uL (ref 150–440)
RBC: 4.2 MIL/uL (ref 3.80–5.20)
RDW: 13.6 % (ref 11.5–14.5)
WBC: 12.4 10*3/uL — ABNORMAL HIGH (ref 3.6–11.0)

## 2015-09-16 LAB — COMPREHENSIVE METABOLIC PANEL
ALK PHOS: 67 U/L (ref 38–126)
ALT: 24 U/L (ref 14–54)
ANION GAP: 4 — AB (ref 5–15)
AST: 34 U/L (ref 15–41)
Albumin: 3.4 g/dL — ABNORMAL LOW (ref 3.5–5.0)
BUN: 10 mg/dL (ref 6–20)
CALCIUM: 8.7 mg/dL — AB (ref 8.9–10.3)
CO2: 26 mmol/L (ref 22–32)
Chloride: 110 mmol/L (ref 101–111)
Creatinine, Ser: 0.68 mg/dL (ref 0.44–1.00)
Glucose, Bld: 97 mg/dL (ref 65–99)
Potassium: 3.6 mmol/L (ref 3.5–5.1)
SODIUM: 140 mmol/L (ref 135–145)
Total Bilirubin: 0.3 mg/dL (ref 0.3–1.2)
Total Protein: 6.5 g/dL (ref 6.5–8.1)

## 2015-09-16 LAB — PREGNANCY, URINE: Preg Test, Ur: NEGATIVE

## 2015-09-16 MED ORDER — MORPHINE SULFATE (PF) 4 MG/ML IV SOLN
4.0000 mg | INTRAVENOUS | Status: DC | PRN
Start: 2015-09-16 — End: 2015-09-17
  Administered 2015-09-16: 4 mg via INTRAVENOUS
  Filled 2015-09-16: qty 1

## 2015-09-16 MED ORDER — SODIUM CHLORIDE 0.9 % IV SOLN
Freq: Once | INTRAVENOUS | Status: AC
  Administered 2015-09-16: 16:00:00 via INTRAVENOUS

## 2015-09-16 MED ORDER — PREGABALIN 75 MG PO CAPS
150.0000 mg | ORAL_CAPSULE | Freq: Two times a day (BID) | ORAL | Status: DC
  Administered 2015-09-16 – 2015-09-18 (×4): 150 mg via ORAL
  Filled 2015-09-16 (×5): qty 2

## 2015-09-16 MED ORDER — ENOXAPARIN SODIUM 40 MG/0.4ML ~~LOC~~ SOLN
40.0000 mg | SUBCUTANEOUS | Status: DC
  Administered 2015-09-16 – 2015-09-17 (×2): 40 mg via SUBCUTANEOUS
  Filled 2015-09-16 (×2): qty 0.4

## 2015-09-16 MED ORDER — ALPRAZOLAM ER 1 MG PO TB24
1.0000 mg | ORAL_TABLET | Freq: Two times a day (BID) | ORAL | Status: DC
  Administered 2015-09-16: 1 mg via ORAL
  Filled 2015-09-16 (×2): qty 1

## 2015-09-16 MED ORDER — ACETAMINOPHEN 325 MG PO TABS
650.0000 mg | ORAL_TABLET | Freq: Four times a day (QID) | ORAL | Status: DC | PRN
  Administered 2015-09-17: 650 mg via ORAL
  Filled 2015-09-16: qty 2

## 2015-09-16 MED ORDER — CLINDAMYCIN PHOSPHATE 600 MG/50ML IV SOLN
600.0000 mg | Freq: Once | INTRAVENOUS | Status: AC
  Administered 2015-09-16: 600 mg via INTRAVENOUS
  Filled 2015-09-16: qty 50

## 2015-09-16 MED ORDER — SODIUM CHLORIDE 0.9% FLUSH
3.0000 mL | Freq: Two times a day (BID) | INTRAVENOUS | Status: DC
  Administered 2015-09-16 – 2015-09-17 (×2): 3 mL via INTRAVENOUS

## 2015-09-16 MED ORDER — GADOBENATE DIMEGLUMINE 529 MG/ML IV SOLN
15.0000 mL | Freq: Once | INTRAVENOUS | Status: AC | PRN
  Administered 2015-09-16: 14 mL via INTRAVENOUS

## 2015-09-16 MED ORDER — QUETIAPINE FUMARATE 100 MG PO TABS
100.0000 mg | ORAL_TABLET | Freq: Every day | ORAL | Status: DC
  Administered 2015-09-16 – 2015-09-17 (×2): 100 mg via ORAL
  Filled 2015-09-16 (×2): qty 1

## 2015-09-16 MED ORDER — MORPHINE SULFATE (PF) 2 MG/ML IV SOLN
2.0000 mg | Freq: Once | INTRAVENOUS | Status: AC
  Administered 2015-09-16: 2 mg via INTRAVENOUS

## 2015-09-16 MED ORDER — SODIUM CHLORIDE 0.9 % IV SOLN: 500.0000 mg | Freq: Once | INTRAVENOUS | Status: DC

## 2015-09-16 MED ORDER — PROMETHAZINE HCL 25 MG/ML IJ SOLN
12.5000 mg | Freq: Four times a day (QID) | INTRAMUSCULAR | Status: DC | PRN
  Administered 2015-09-17: 25 mg via INTRAVENOUS
  Filled 2015-09-16: qty 1

## 2015-09-16 MED ORDER — ONDANSETRON HCL 4 MG/2ML IJ SOLN
4.0000 mg | Freq: Once | INTRAMUSCULAR | Status: AC
  Administered 2015-09-16: 4 mg via INTRAVENOUS
  Filled 2015-09-16: qty 2

## 2015-09-16 MED ORDER — SODIUM CHLORIDE 0.9 % IV SOLN
1.0000 g | Freq: Three times a day (TID) | INTRAVENOUS | Status: DC
  Administered 2015-09-17 – 2015-09-18 (×5): 1 g via INTRAVENOUS
  Filled 2015-09-16 (×7): qty 1

## 2015-09-16 MED ORDER — IOPAMIDOL (ISOVUE-300) INJECTION 61%
75.0000 mL | Freq: Once | INTRAVENOUS | Status: AC | PRN
  Administered 2015-09-16: 75 mL via INTRAVENOUS

## 2015-09-16 MED ORDER — ACETAMINOPHEN 650 MG RE SUPP: 650.0000 mg | Freq: Four times a day (QID) | RECTAL | Status: DC | PRN

## 2015-09-16 MED ORDER — SODIUM CHLORIDE 0.9 % IV SOLN
INTRAVENOUS | Status: AC
  Administered 2015-09-16: 23:00:00 via INTRAVENOUS

## 2015-09-16 MED ORDER — MORPHINE SULFATE (PF) 2 MG/ML IV SOLN
INTRAVENOUS | Status: AC
  Administered 2015-09-16: 2 mg via INTRAVENOUS
  Filled 2015-09-16: qty 1

## 2015-09-16 MED ORDER — DIPHENHYDRAMINE HCL 50 MG/ML IJ SOLN
25.0000 mg | Freq: Once | INTRAMUSCULAR | Status: AC
  Administered 2015-09-16: 25 mg via INTRAVENOUS
  Filled 2015-09-16: qty 1

## 2015-09-16 MED ORDER — MORPHINE SULFATE (PF) 4 MG/ML IV SOLN
4.0000 mg | Freq: Once | INTRAVENOUS | Status: AC
  Administered 2015-09-16: 4 mg via INTRAVENOUS
  Filled 2015-09-16: qty 1

## 2015-09-16 MED ORDER — VANCOMYCIN HCL IN DEXTROSE 1-5 GM/200ML-% IV SOLN
1000.0000 mg | Freq: Once | INTRAVENOUS | Status: AC
  Administered 2015-09-17: 1000 mg via INTRAVENOUS
  Filled 2015-09-16: qty 200

## 2015-09-16 NOTE — ED Notes (Signed)
Pt returned from MRI °

## 2015-09-16 NOTE — ED Notes (Addendum)
[facility] swelling to left side of forehead and left jaw.  States she woke up from nap like this  Also states she is dizzy and having blurred vision. States she feels like she is slightly disoriented

## 2015-09-16 NOTE — ED Notes (Signed)
PT c/o waking up this morning with left sided facial swelling and a knot of her forehead. Pt also states she has had blurred vision and dizziness. Denies any SOB or confusion. Pt A&O

## 2015-09-16 NOTE — ED Notes (Signed)
Patient transported to MRI 

## 2015-09-16 NOTE — ED Notes (Signed)
Pt mother states " pt has been acting disoriented and more confused than usual, doing odd things at home."

## 2015-09-16 NOTE — ED Provider Notes (Signed)
[facility] Emergency Department Provider Note  ____________________________________________  Time seen: Approximately 4:35 PM  I have reviewed the triage vital signs and the nursing notes.   HISTORY  Chief Complaint Facial Swelling    HPI PAIJE GOODHART is a 40 y.o. female who comes in reporting that she got up from a nap and had pain and swelling in her face. She has a $0.50 piece size area for head just above the nose which is red swollen with a pale area surrounding it is tender to touch patient reports he feels little bit numb in the middle of it as well. Patient's nose is slightly flushed and patient has a swollen area at the left side of the angle of the jaw. That is also slightly red and tender. The swelling does not cover the ankle of the jaw. Patient also reports feeling weak and dizzy having blurry vision and double vision   Past Medical History  Diagnosis Date  . Bipolar affective (HCC)     pt reported  . Uterine polyp   . Eating disorder     Under control per patient  . Renal disorder   . Painful menstrual periods   . Abnormal vaginal Pap smear     10+ years ago- no colpo repeat was normal  . Painful intercourse   . GERD (gastroesophageal reflux disease)   . VWD (acquired von Willebrand's disease) (HCC)   . Anxiety   . Spinal stenosis   . Kidney stone   . Bipolar disorder (HCC)   . AR (allergic rhinitis)   . Pelvic pain in female   . Fibromyalgia   . PTSD (post-traumatic stress disorder)     Patient Active Problem List   Diagnosis Date Noted  . Cervical spine pain 09/08/2015  . Smoker 07/25/2015  . Hypotension 07/25/2015  . Upper respiratory infection 07/12/2015  . Snoring 06/09/2015  . Cardiac murmur 05/25/2015  . Cloudy urine 05/25/2015  . Numbness of foot 04/20/2015  . Drug-induced nausea and vomiting 03/22/2015  . Need for immunization against influenza 03/21/2015  . Cyst, bone 02/14/2015  . Pelvic adhesive disease  01/21/2015  . Tick bite 01/20/2015  . Arthritis 12/19/2014  . Calculus of kidney 12/13/2014  . Patchy loss of hair 12/13/2014  . Anxiety disorder 12/13/2014  . Postural dizziness 12/13/2014  . Subcutaneous cyst 12/13/2014  . Nephrolithiasis 05/23/2012  . Chronic pelvic pain in female 05/23/2012  . Fibromyalgia 05/23/2012  . Bipolar disorder with depression (HCC) 06/05/2011    Past Surgical History  Procedure Laterality Date  . Tubal ligation    . Laparoscopy abdomen diagnostic    . Hysteroscopy      removed polyps  . Laparoscopic vaginal hysterectomy  2015    at [facility]  . Abdominal hysterectomy    . Laparoscopy Left 01/10/2015    Procedure: LAPAROSCOPY OPERATIVE with biopsy, left oopherectomy;  Surgeon: Herold Harms, MD;  Location: [facility] ORS;  Service: Gynecology;  Laterality: Left;    Current Outpatient Rx  Name  Route  Sig  Dispense  Refill  . ALPRAZolam (XANAX) 0.5 MG tablet   Oral   Take 1 tablet (0.5 mg total) by mouth 3 (three) times daily as needed for anxiety.   90 tablet   0   . Asenapine Maleate (SAPHRIS) 10 MG SUBL   Sublingual   Place 1 tablet under the tongue at bedtime.         . Biotin w/ Vitamins C & E (  HAIR SKIN & NAILS GUMMIES) 1250-7.5-7.5 MCG-MG-UNT CHEW   Oral   Chew 1 each by mouth daily.         Marland Kitchen ibuprofen (ADVIL,MOTRIN) 800 MG tablet   Oral   Take 1 tablet (800 mg total) by mouth 3 (three) times daily.   30 tablet   1   . LYRICA 150 MG capsule   Oral   Take 1 capsule (150 mg total) by mouth 2 (two) times daily.   60 capsule   0     Dispense as written.   . Melatonin 10 MG SUBL   Sublingual   Place 10 mg under the tongue at bedtime.         . ondansetron (ZOFRAN) 4 MG tablet   Oral   Take 1 tablet (4 mg total) by mouth every 8 (eight) hours as needed for nausea or vomiting.   10 tablet   0   . oxyCODONE-acetaminophen (PERCOCET) 7.5-325 MG tablet   Oral   Take 1 tablet by mouth every 8 (eight) hours as needed  for severe pain.   90 tablet   0   . promethazine (PHENERGAN) 25 MG tablet   Oral   Take 1 tablet (25 mg total) by mouth daily as needed for nausea or vomiting.   30 tablet   0   . zolpidem (AMBIEN) 10 MG tablet   Oral   Take 10 mg by mouth at bedtime. Reported on 08/11/2015      2     Allergies Wellbutrin; Hydrocodone-acetaminophen; Nitrofurantoin; Nitrofurantoin monohyd macro; Penicillins; Vicoprofen ; Lasix; and Nitrofurantoin monohyd macro  Family History  Problem Relation Age of Onset  . Breast cancer Paternal Grandmother   . Diabetes Maternal Grandmother   . Hypertension Father   . Sleep apnea Mother   . Diabetes Maternal Aunt   . Diabetes Maternal Uncle     Social History Social History  Substance Use Topics  . Smoking status: Current Every Day Smoker -- 0.75 packs/day    Types: Cigarettes    Start date: 05/18/1995  . Smokeless tobacco: Never Used  . Alcohol Use: 0.0 oz/week    0 Standard drinks or equivalent per week     Comment: Socially    Review of Systems Constitutional: No fever/chills Eyes: See history of present illness ENT: No sore throat. Cardiovascular: Denies chest pain. Respiratory: Denies shortness of breath. Gastrointestinal: No abdominal pain.  No nausea, no vomiting.  No diarrhea.  No constipation. Genitourinary: Negative for dysuria. Musculoskeletal: Negative for back pain. Skin: Negative for rash. Neurological: Negative for focal weakness or numbness.  10-point ROS otherwise negative.  ____________________________________________   PHYSICAL EXAM:  VITAL SIGNS: ED Triage Vitals  Enc Vitals Group     BP 09/16/15 1527 125/84 mmHg     Pulse Rate 09/16/15 1527 125     Resp 09/16/15 1527 20     Temp 09/16/15 1527 98.6 F (37 C)     Temp Source 09/16/15 1527 Oral     SpO2 09/16/15 1527 98 %     Weight 09/16/15 1527 154 lb (69.854 kg)     Height 09/16/15 1527  (1.575 m)     Head Cir --      Peak Flow --      Pain Score  09/16/15 1527 6     Pain Loc --      Pain Edu? --      Excl. in GC? --     Constitutional: Alert  and oriented. Well appearing and in no acute distress. Eyes: Conjunctivae are normal. PERRL. EOMI. Head: Atraumatic. But there is the swelling as described in H&P Nose: No congestion/rhinnorhea. Nasal skin is slightly erythematous Mouth/Throat: Mucous membranes are moist.  Oropharynx non-erythematous. Of both buccal mucosa Neck: No stridor.  Cardiovascular: Normal rate, regular rhythm. Grossly normal heart sounds.  Good peripheral circulation. Respiratory: Normal respiratory effort.  No retractions. Lungs CTAB. Gastrointestinal: Soft and nontender. No distention. No abdominal bruits. No CVA tenderness. Musculoskeletal: No lower extremity tenderness nor edema.  No joint effusions. Neurologic:  Normal speech and language. No gross focal neurologic deficits are appreciated. No gait instability. Skin:  Skin is warm, dry and intact. No rash noted. Psychiatric: Mood and affect are normal. Speech and behavior are normal.  ____________________________________________   LABS (all labs ordered are listed, but only abnormal results are displayed)  Labs Reviewed  URINE DRUG SCREEN, QUALITATIVE ([facility] ONLY) - Abnormal; Notable for the following:    Opiate, Ur Screen POSITIVE (*)    Benzodiazepine, Ur Scrn POSITIVE (*)    All other components within normal limits  CBC WITH DIFFERENTIAL/PLATELET - Abnormal; Notable for the following:    WBC 12.4 (*)    Neutro Abs 8.3 (*)    All other components within normal limits  COMPREHENSIVE METABOLIC PANEL - Abnormal; Notable for the following:    Calcium 8.7 (*)    Albumin 3.4 (*)    Anion gap 4 (*)    All other components within normal limits  PREGNANCY, URINE  CBC WITH DIFFERENTIAL/PLATELET   ____________________________________________  EKG  EKG read and interpreted by me shows sinus tachycardia at a rate of 103 normal axis no acute ST-T wave  changes ____________________________________________  RADIOLOGY  CT of the head and face show what appears to be cellulitis in the forehead and in the area of the left parotid including possibly the left parotid. MRI shows no intracranial abnormalities there is some facial cellulitis going on ____________________________________________   PROCEDURES    ____________________________________________   INITIAL IMPRESSION / ASSESSMENT AND PLAN / ED COURSE  Pertinent labs & imaging results that were available during my care of the patient were reviewed by me and considered in my medical decision making (see chart for details).  As time progresses and swelling gets worse especially in the left jaw area is now moving somewhat down her neck. I will give her the clindamycin and plan on admitting once again the MRI. ____________________________________________   FINAL CLINICAL IMPRESSION(S) / ED DIAGNOSES  Final diagnoses:  Facial cellulitis   Rapidly progressing facial cellulitis   Arnaldo NatalPaul F Malinda, MD 09/16/15 2112

## 2015-09-16 NOTE — Progress Notes (Signed)
Pharmacy Antibiotic Note  Casey Hodges is a 40 y.o. female admitted on 09/16/2015 with cellulitis.  Pharmacy has been consulted for vancomycin and meropenem dosing.  Plan: DW 60kg  Vd 42L kei 0.078 hr-1  t1/2 9 hours Vancomycin 1 gram q 12 hours ordered with stacked dosing, Level before 5th dose. Goal trough 15-20.  Meropenem 1 gram q 8 hours ordered.  Height: 5\' 2"  (157.5 cm) Weight: 164 lb 3.2 oz (74.481 kg) IBW/kg (Calculated) : 50.1  Temp (24hrs), Avg:98.5 F (36.9 C), Min:98.3 F (36.8 C), Max:98.6 F (37 C)   Recent Labs Lab 09/16/15 1641  WBC 12.4*  CREATININE 0.68    Estimated Creatinine Clearance: 89.3 mL/min (by C-G formula based on Cr of 0.68).    Allergies  Allergen Reactions  . Wellbutrin [Bupropion Hcl] Other (See Comments)    Reaction:  Suicidal   . Augmentin [Amoxicillin-Pot Clavulanate] Diarrhea, Nausea And Vomiting and Other (See Comments)    Has patient had a PCN reaction causing immediate rash, facial/tongue/throat swelling, SOB or lightheadedness with hypotension: No Has patient had a PCN reaction causing severe rash involving mucus membranes or skin necrosis: No Has patient had a PCN reaction that required hospitalization No Has patient had a PCN reaction occurring within the last 10 years: Yes If all of the above answers are "NO", then may proceed with Cephalosporin use.  Marland Kitchen. Hydrocodone-Acetaminophen Nausea And Vomiting  . Penicillins Diarrhea, Nausea And Vomiting and Other (See Comments)    Has patient had a PCN reaction causing immediate rash, facial/tongue/throat swelling, SOB or lightheadedness with hypotension: No Has patient had a PCN reaction causing severe rash involving mucus membranes or skin necrosis: No Has patient had a PCN reaction that required hospitalization No Has patient had a PCN reaction occurring within the last 10 years: Yes If all of the above answers are "NO", then may proceed with Cephalosporin use.  Casey Hodges. Vicoprofen   [Hydrocodone-Ibuprofen] Nausea And Vomiting  . Lasix [Furosemide] Rash  . Nitrofurantoin Monohyd Macro Rash    Antimicrobials this admission: vancomycin meropenem >>    >>   Dose adjustments this admission:   Microbiology results: 3/24 BCx: pending   Thank you for allowing pharmacy to be a part of this patient's care.  Casey Hodges S 09/16/2015 11:50 PM

## 2015-09-16 NOTE — H&P (Signed)
[facility] [place] [facility]   PATIENT NAME: Casey CrookKena Hodges    MR#:  [id number]  DATE OF BIRTH:  07/04/1975  DATE OF ADMISSION:  09/16/2015  PRIMARY CARE PHYSICIAN: Brayton ElSyed Shah, MD   REQUESTING/REFERRING PHYSICIAN: Darnelle CatalanMalinda, MD  CHIEF COMPLAINT:   Chief Complaint  Patient presents with  . Facial Swelling    HISTORY OF PRESENT ILLNESS:  Casey Hodges  is a 40 y.o. female who presents with facial swelling and tenderness, as well as some cold chills. Patient states that she woke up this morning and noticed some swelling in her forehead, as well as some central erythema and tenderness. She came to the walk-in clinic and throughout the day has also had some progressive swelling of her left jaw area. In walk-in clinic this or she had a low-grade fever and sent her to the ED for scan. CT scan showed consistent with facial cellulitis without abscess, without significant involvement in other facial structures, except for one comment about possible mild parotid gland inflammation. Per ED provider has progressed even in the short time and she's been here in the ED. She has tachycardia and an elevated white blood cell count. Hospitals were called for admission for sepsis due to facial cellulitis.  PAST MEDICAL HISTORY:   Past Medical History  Diagnosis Date  . Bipolar affective (HCC)     pt reported  . Uterine polyp   . Eating disorder     Under control per patient  . Renal disorder   . Painful menstrual periods   . Abnormal vaginal Pap smear     10+ years ago- no colpo repeat was normal  . Painful intercourse   . GERD (gastroesophageal reflux disease)   . VWD (acquired von Willebrand's disease) (HCC)   . Anxiety   . Spinal stenosis   . Kidney stone   . Bipolar disorder (HCC)   . AR (allergic rhinitis)   . Pelvic pain in female   . Fibromyalgia   . PTSD (post-traumatic stress disorder)     PAST SURGICAL HISTORY:   Past Surgical History  Procedure  Laterality Date  . Tubal ligation    . Laparoscopy abdomen diagnostic    . Hysteroscopy      removed polyps  . [facility]ar[facility] vaginal [facility]  [facility]  . Abdominal hysterectomy    . Laparoscopy Left 01/10/2015    Procedure: LAPAROSCOPY OPERATIVE with biopsy, left oopherectomy;  Surgeon: Herold HarmsMartin A Defrancesco, MD;  Location: [facility] ORS;  Service: Gynecology;  Laterality: Left;    SOCIAL HISTORY:   Social History  Substance Use Topics  . Smoking status: Current Every Day Smoker -- 0.75 packs/day    Types: Cigarettes    Start date: 05/18/1995  . Smokeless tobacco: Never Used  . Alcohol Use: 0.0 oz/week    0 Standard drinks or equivalent per week     Comment: Socially    FAMILY HISTORY:   Family History  Problem Relation Age of Onset  . Breast cancer Paternal Grandmother   . Diabetes Maternal Grandmother   . Hypertension Father   . Sleep apnea Mother   . Diabetes Maternal Aunt   . Diabetes Maternal Uncle     DRUG ALLERGIES:   Allergies  Allergen Reactions  . Wellbutrin [Bupropion Hcl] Other (See Comments)    Reaction:  Suicidal   . Augmentin [Amoxicillin-Pot Clavulanate] Diarrhea, Nausea And Vomiting and Other (See Comments)    Has patient had a PCN  reaction causing immediate rash, facial/tongue/throat swelling, SOB or lightheadedness with hypotension: No Has patient had a PCN reaction causing severe rash involving mucus membranes or skin necrosis: No Has patient had a PCN reaction that required hospitalization No Has patient had a PCN reaction occurring within the last 10 years: Yes If all of the above answers are "NO", then may proceed with Cephalosporin use.  Marland Kitchen Hydrocodone-Acetaminophen Nausea And Vomiting  . Penicillins Diarrhea, Nausea And Vomiting and Other (See Comments)    Has patient had a PCN reaction causing immediate rash, facial/tongue/throat swelling, SOB or lightheadedness with hypotension: No Has patient had a PCN reaction causing severe  rash involving mucus membranes or skin necrosis: No Has patient had a PCN reaction that required hospitalization No Has patient had a PCN reaction occurring within the last 10 years: Yes If all of the above answers are "NO", then may proceed with Cephalosporin use.  [name]  [Hydrocodone-Ibuprofen] Nausea And Vomiting  . Lasix [Furosemide] Rash  . Nitrofurantoin Monohyd Macro Rash    MEDICATIONS AT HOME:   Prior to Admission medications   Medication Sig Start Date End Date Taking? Authorizing Provider  ALPRAZolam (XANAX XR) 1 MG 24 hr tablet Take 1 mg by mouth 2 (two) times daily.   Yes Historical Provider, MD  Biotin w/ Vitamins C & E (HAIR SKIN & NAILS GUMMIES) 1250-7.5-7.5 MCG-MG-UNT CHEW Chew 1 each by mouth daily.   Yes Historical Provider, MD  ibuprofen (ADVIL,MOTRIN) 800 MG tablet Take 800 mg by mouth 3 (three) times daily as needed for mild pain.   Yes Historical Provider, MD  LYRICA 150 MG capsule Take 1 capsule (150 mg total) by mouth 2 (two) times daily. 09/08/15  Yes Ellyn Hack, MD  Melatonin 10 MG SUBL Place 10 mg under the tongue at bedtime as needed (for sleep).    Yes Historical Provider, MD  ondansetron (ZOFRAN) 4 MG tablet Take 1 tablet (4 mg total) by mouth every 8 (eight) hours as needed for nausea or vomiting. 07/24/15  Yes Governor Rooks, MD  oxyCODONE-acetaminophen (PERCOCET) 7.5-325 MG tablet Take 1 tablet by mouth every 8 (eight) hours as needed for severe pain. Patient taking differently: Take 2 tablets by mouth every 6 (six) hours as needed for severe pain.  09/08/15  Yes Ellyn Hack, MD  promethazine (PHENERGAN) 25 MG tablet Take 1 tablet (25 mg total) by mouth daily as needed for nausea or vomiting. 09/08/15  Yes Ellyn Hack, MD  QUEtiapine (SEROQUEL) 100 MG tablet Take 100 mg by mouth at bedtime.   Yes Historical Provider, MD  ALPRAZolam Prudy Feeler) 0.5 MG tablet Take 1 tablet (0.5 mg total) by mouth 3 (three) times daily as needed for anxiety. Patient  not taking: Reported on 09/16/2015 09/08/15   Ellyn Hack, MD    REVIEW OF SYSTEMS:  Review of Systems  Constitutional: Positive for chills. Negative for fever, weight loss and malaise/fatigue.  HENT: Negative for ear pain, hearing loss and tinnitus.        Facial swelling and tenderness  Eyes: Negative for blurred vision, double vision, pain and redness.  Respiratory: Negative for cough, hemoptysis and shortness of breath.   Cardiovascular: Negative for chest pain, palpitations, orthopnea and leg swelling.  Gastrointestinal: Negative for nausea, vomiting, abdominal pain, diarrhea and constipation.  Genitourinary: Negative for dysuria, frequency and hematuria.  Musculoskeletal: Negative for back pain, joint pain and neck pain.  Skin:       No  acne, rash, or lesions  Neurological: Negative for dizziness, tremors, focal weakness and weakness.  Endo/Heme/Allergies: Negative for polydipsia. Does not bruise/bleed easily.  Psychiatric/Behavioral: Negative for depression. The patient is not nervous/anxious and does not have insomnia.      VITAL SIGNS:   Filed Vitals:   09/16/15 1527 09/16/15 1652 09/16/15 1830 09/16/15 1930  BP: 125/84  125/92 141/93  Pulse: 125 107 108 114  Temp: 98.6 F (37 C)     TempSrc: Oral     Resp: 20 17 16 16   Height: 5\' 2"  (1.575 m)     Weight: 69.854 kg (154 lb)     SpO2: 98% 100% 99% 100%   Wt Readings from Last 3 Encounters:  09/16/15 69.854 kg (154 lb)  09/08/15 69.355 kg (152 lb 14.4 oz)  08/11/15 68.675 kg (151 lb 6.4 oz)    PHYSICAL EXAMINATION:  Physical Exam  Vitals reviewed. Constitutional: She is oriented to person, place, and time. She appears well-developed and well-nourished. No distress.  HENT:  Head: Normocephalic and atraumatic.  Mouth/Throat: Oropharynx is clear and moist.  Medial left-sided facial swelling of her forehead, periorbital area, and left parotid region.  Eyes: Conjunctivae and EOM are normal. Pupils are equal,  round, and reactive to light. No scleral icterus.  Neck: Normal range of motion. Neck supple. No JVD present. No thyromegaly present.  Cardiovascular: Regular rhythm and intact distal pulses.  Exam reveals no gallop and no friction rub.   No murmur heard. Tachycardic  Respiratory: Effort normal and breath sounds normal. No respiratory distress. She has no wheezes. She has no rales.  GI: Soft. Bowel sounds are normal. She exhibits no distension. There is no tenderness.  Musculoskeletal: Normal range of motion. She exhibits no edema.  No arthritis, no gout  Lymphadenopathy:    She has no cervical adenopathy.  Neurological: She is alert and oriented to person, place, and time. No cranial nerve deficit.  No dysarthria, no aphasia  Skin: Skin is warm and dry. No rash noted. There is erythema (left medial forehead).  Psychiatric: She has a normal mood and affect. Her behavior is normal. Judgment and thought content normal.    LABORATORY PANEL:   CBC  Recent Labs Lab 09/16/15 1641  WBC 12.4*  HGB 12.8  HCT 38.1  PLT 179   ------------------------------------------------------------------------------------------------------------------  Chemistries   Recent Labs Lab 09/16/15 1641  NA 140  K 3.6  CL 110  CO2 26  GLUCOSE 97  BUN 10  CREATININE 0.68  CALCIUM 8.7*  AST 34  ALT 24  ALKPHOS 67  BILITOT 0.3   ------------------------------------------------------------------------------------------------------------------  Cardiac Enzymes No results for input(s): TROPONINI in the last 168 hours. ------------------------------------------------------------------------------------------------------------------  RADIOLOGY:  Ct Head Wo Contrast  09/16/2015  CLINICAL DATA:  LEFT-sided facial swelling after awakening earlier today. Knot on LEFT forehead. EXAM: CT HEAD WITHOUT CONTRAST CT MAXILLOFACIAL WITH CONTRAST TECHNIQUE: Multidetector CT imaging of the head was performed  without contrast and maxillofacial structures was performed using the standard protocol with intravenous contrast. Multiplanar CT image reconstructions of the maxillofacial structures were also generated. COMPARISON:  12/31/2014. CONTRAST:  75 mL Isovue-300 FINDINGS: CT HEAD FINDINGS No evidence for acute infarction, hemorrhage, mass lesion, hydrocephalus, or extra-axial fluid. Normal cerebral volume. No white matter disease. Calvarium intact. CT MAXILLOFACIAL FINDINGS The maxillary, ethmoid, sphenoid, and frontal sinuses are clear. There is no middle ear or mastoid fluid. The orbits are negative. There is frontal soft tissue swelling, LEFT paramedian prominence, as seen for instance  on image 8 series 4. No underlying osseous or paranasal sinus inflammatory process. The adjacent orbits are negative. Over the LEFT cheek, there is thickening of the platysma, and cellulitis of the subcutaneous fat overlying the masseter muscle. No submandibular gland inflammation is seen. There may be mild asymmetric enhancement of the LEFT parotid tail. Correlate clinically for acute LEFT parotitis. Stranding of the fat does extend to the LEFT masticator space. No calculi are evident. There does not appear to be acute periodontal disease or dental caries. An asymmetric level II lymph node is not pathologically enlarged, 6 mm short axis. Retroantral fat of the maxillary sinuses is preserved. No visible tonsillitis or adenoidal enlargement. No evidence for eustachian tube dysfunction. No osseous inflammatory process of the skull base or upper cervical region. IMPRESSION: Negative CT head. Cellulitis involving the LEFT paramedian forehead, as well as the LEFT cheek and LEFT masticator space without visible abscess. No submandibular gland information is observed but there may be slight asymmetric enhancement of the tail of the LEFT parotid; it is not clear this is primary versus secondary. No acute sinusitis or temporal bone inflammation.  No periodontal disease is evident. Electronically Signed   By: Elsie Stain M.D.   On: 09/16/2015 18:32   Mr Laqueta Jean ZO Contrast  09/16/2015  CLINICAL DATA:  41 year old female with dizziness, disoriented double vision, swelling left side of forehead and jaw. Initial encounter. EXAM: MRI HEAD WITHOUT AND WITH CONTRAST TECHNIQUE: Multiplanar, multiecho pulse sequences of the brain and surrounding structures were obtained without and with intravenous contrast. CONTRAST:  14mL MULTIHANCE GADOBENATE DIMEGLUMINE 529 MG/ML IV SOLN COMPARISON:  CT head and face from 1807 hours today. Brain MRI 05/11/2014. FINDINGS: Major intracranial vascular flow voids are stable and within normal limits. Cerebral volume is normal. No restricted diffusion to suggest acute infarction. No midline shift, mass effect, evidence of mass lesion, ventriculomegaly, extra-axial collection or acute intracranial hemorrhage. Cervicomedullary junction and pituitary are within normal limits. Negative visualized cervical spine. Wallace Cullens and white matter signal remains within normal limits throughout the brain. No abnormal intracranial enhancement identified. No dural thickening. Visible internal auditory structures appear normal. Mastoids are clear. Paranasal sinuses are clear. There is soft tissue swelling about the forehead, eccentric to the left, an the bridge of the nose, with patchy hyper enhancement. Globes and intraorbital soft tissues appear to remain normal. No associated fluid collection. The parotid glands appear normal by MRI. Normal bone marrow signal. IMPRESSION: 1. Stable and normal MRI appearance of the brain. 2. Forehead soft tissue swelling and enhancement compatible with cellulitis. No associated fluid collection. Underlying paranasal sinuses and orbits appear to remain normal. Electronically Signed   By: Odessa Fleming M.D.   On: 09/16/2015 21:07   Ct Maxillofacial W/cm  09/16/2015  CLINICAL DATA:  LEFT-sided facial swelling after  awakening earlier today. Knot on LEFT forehead. EXAM: CT HEAD WITHOUT CONTRAST CT MAXILLOFACIAL WITH CONTRAST TECHNIQUE: Multidetector CT imaging of the head was performed without contrast and maxillofacial structures was performed using the standard protocol with intravenous contrast. Multiplanar CT image reconstructions of the maxillofacial structures were also generated. COMPARISON:  12/31/2014. CONTRAST:  75 mL Isovue-300 FINDINGS: CT HEAD FINDINGS No evidence for acute infarction, hemorrhage, mass lesion, hydrocephalus, or extra-axial fluid. Normal cerebral volume. No white matter disease. Calvarium intact. CT MAXILLOFACIAL FINDINGS The maxillary, ethmoid, sphenoid, and frontal sinuses are clear. There is no middle ear or mastoid fluid. The orbits are negative. There is frontal soft tissue swelling, LEFT paramedian prominence, as  seen for instance on image 8 series 4. No underlying osseous or paranasal sinus inflammatory process. The adjacent orbits are negative. Over the LEFT cheek, there is thickening of the platysma, and cellulitis of the subcutaneous fat overlying the masseter muscle. No submandibular gland inflammation is seen. There may be mild asymmetric enhancement of the LEFT parotid tail. Correlate clinically for acute LEFT parotitis. Stranding of the fat does extend to the LEFT masticator space. No calculi are evident. There does not appear to be acute periodontal disease or dental caries. An asymmetric level II lymph node is not pathologically enlarged, 6 mm short axis. Retroantral fat of the maxillary sinuses is preserved. No visible tonsillitis or adenoidal enlargement. No evidence for eustachian tube dysfunction. No osseous inflammatory process of the skull base or upper cervical region. IMPRESSION: Negative CT head. Cellulitis involving the LEFT paramedian forehead, as well as the LEFT cheek and LEFT masticator space without visible abscess. No submandibular gland information is observed but  there may be slight asymmetric enhancement of the tail of the LEFT parotid; it is not clear this is primary versus secondary. No acute sinusitis or temporal bone inflammation. No periodontal disease is evident. Electronically Signed   By: Elsie Stain M.D.   On: 09/16/2015 18:32    EKG:   Orders placed or performed during the hospital encounter of 09/16/15  . EKG 12-Lead  . EKG 12-Lead    IMPRESSION AND PLAN:  Principal Problem:   Sepsis (HCC) - IV antibiotics started in the ED, we'll continue them on admission for cellulitis order set. Blood culture sent. This is most likely due to her cellulitis. Hemodynamically stable Active Problems:   Facial cellulitis - IV antibiotics and cultures as above.   Bipolar disorder with depression (HCC) - continue home meds   Anxiety disorder - home dose anxiolytics   GERD (gastroesophageal reflux disease) - not on home meds, treat when necessary  All the records are reviewed and case discussed with ED provider. Management plans discussed with the patient and/or family.  DVT PROPHYLAXIS: SubQ lovenox  GI PROPHYLAXIS: None  ADMISSION STATUS: Inpatient  CODE STATUS: Full Code Status History    This patient does not have a recorded code status. Please follow your organizational policy for patients in this situation.      TOTAL TIME TAKING CARE OF THIS PATIENT: 45 minutes.    Kegan Mckeithan FIELDING 09/16/2015, 9:29 PM  [facility]  Office  [phone]  CC: Primary care physician; Brayton El, MD

## 2015-09-17 LAB — BASIC METABOLIC PANEL
Anion gap: 3 — ABNORMAL LOW (ref 5–15)
BUN: 8 mg/dL (ref 6–20)
CHLORIDE: 111 mmol/L (ref 101–111)
CO2: 23 mmol/L (ref 22–32)
Calcium: 8.4 mg/dL — ABNORMAL LOW (ref 8.9–10.3)
Creatinine, Ser: 0.71 mg/dL (ref 0.44–1.00)
GFR calc Af Amer: >60 mL/min (ref >60)
GLUCOSE: 105 mg/dL — AB (ref 65–99)
POTASSIUM: 3.8 mmol/L (ref 3.5–5.1)
Sodium: 137 mmol/L (ref 135–145)

## 2015-09-17 LAB — CBC
HEMATOCRIT: 35.4 % (ref 35.0–47.0)
HEMOGLOBIN: 11.9 g/dL — AB (ref 12.0–16.0)
MCH: 30.1 pg (ref 26.0–34.0)
MCHC: 33.6 g/dL (ref 32.0–36.0)
MCV: 89.7 fL (ref 80.0–100.0)
Platelets: 167 10*3/uL (ref 150–440)
RBC: 3.94 MIL/uL (ref 3.80–5.20)
RDW: 13.7 % (ref 11.5–14.5)
WBC: 9.1 10*3/uL (ref 3.6–11.0)

## 2015-09-17 MED ORDER — ALPRAZOLAM ER 0.5 MG PO TB24
0.5000 mg | ORAL_TABLET | Freq: Two times a day (BID) | ORAL | Status: DC
  Administered 2015-09-17: 0.5 mg via ORAL
  Filled 2015-09-17: qty 1

## 2015-09-17 MED ORDER — SODIUM CHLORIDE 0.9 % IV SOLN
INTRAVENOUS | Status: DC
  Administered 2015-09-17 – 2015-09-18 (×2): via INTRAVENOUS

## 2015-09-17 MED ORDER — SODIUM CHLORIDE 0.9 % IV BOLUS (SEPSIS)
1000.0000 mL | Freq: Once | INTRAVENOUS | Status: AC
Start: 2015-09-17 — End: 2015-09-17
  Administered 2015-09-17: 1000 mL via INTRAVENOUS

## 2015-09-17 MED ORDER — VANCOMYCIN HCL IN DEXTROSE 1-5 GM/200ML-% IV SOLN
1000.0000 mg | Freq: Two times a day (BID) | INTRAVENOUS | Status: DC
  Administered 2015-09-17 – 2015-09-18 (×3): 1000 mg via INTRAVENOUS
  Filled 2015-09-17 (×5): qty 200

## 2015-09-17 MED ORDER — MORPHINE SULFATE (PF) 2 MG/ML IV SOLN
2.0000 mg | INTRAVENOUS | Status: DC | PRN
  Administered 2015-09-17 (×2): 2 mg via INTRAVENOUS
  Filled 2015-09-17 (×2): qty 1

## 2015-09-17 NOTE — Progress Notes (Signed)
[facility] - [place] at [facility]   PATIENT NAME: Casey Hodges    MR#:  [id number]  DATE OF BIRTH:  1976-03-19  SUBJECTIVE:  States that her face is swollen her eyes are somewhat difficult to open Still complains of facial pain Afebrile overnight  REVIEW OF SYSTEMS:  CONSTITUTIONAL: No fever, positive fatigue or weakness.  EYES: No blurred or double vision.  EARS, NOSE, AND THROAT: No tinnitus or ear pain.  RESPIRATORY: No cough, shortness of breath, wheezing or hemoptysis.  CARDIOVASCULAR: No chest pain, orthopnea, edema.  GASTROINTESTINAL: No nausea, vomiting, diarrhea or abdominal pain.  GENITOURINARY: No dysuria, hematuria.  ENDOCRINE: No polyuria, nocturia,  HEMATOLOGY: No anemia, easy bruising or bleeding SKIN: No rash or lesion. MUSCULOSKELETAL: No joint pain or arthritis.   NEUROLOGIC: No tingling, numbness, weakness.  PSYCHIATRY: No anxiety or depression.   DRUG ALLERGIES:   Allergies  Allergen Reactions  . Wellbutrin [Bupropion Hcl] Other (See Comments)    Reaction:  Suicidal   . Augmentin [Amoxicillin-Pot Clavulanate] Diarrhea, Nausea And Vomiting and Other (See Comments)    Has patient had a PCN reaction causing immediate rash, facial/tongue/throat swelling, SOB or lightheadedness with hypotension: No Has patient had a PCN reaction causing severe rash involving mucus membranes or skin necrosis: No Has patient had a PCN reaction that required hospitalization No Has patient had a PCN reaction occurring within the last 10 years: Yes If all of the above answers are "NO", then may proceed with Cephalosporin use.  Marland Kitchen Hydrocodone-Acetaminophen Nausea And Vomiting  . Penicillins Diarrhea, Nausea And Vomiting and Other (See Comments)    Has patient had a PCN reaction causing immediate rash, facial/tongue/throat swelling, SOB or lightheadedness with hypotension: No Has patient had a PCN reaction causing severe rash involving mucus membranes or skin  necrosis: No Has patient had a PCN reaction that required hospitalization No Has patient had a PCN reaction occurring within the last 10 years: Yes If all of the above answers are "NO", then may proceed with Cephalosporin use.  [name]  [Hydrocodone-Ibuprofen] Nausea And Vomiting  . Lasix [Furosemide] Rash  . Nitrofurantoin Monohyd Macro Rash    VITALS:  Blood pressure 97/52, pulse 91, temperature 98.8 F (37.1 C), temperature source Oral, resp. rate 16, height  (1.575 m), weight 74.481 kg (164 lb 3.2 oz), last menstrual period 09/16/2015, SpO2 98 %.  PHYSICAL EXAMINATION:  VITAL SIGNS: Filed Vitals:   09/17/15 0802 09/17/15 1112  BP: 98/58 97/52  Pulse: 97 91  Temp: 98.6 F (37 C) 98.8 F (37.1 C)  Resp: 14 16   GENERAL:40 y.o.female currently in minimal acute distress. Given mental status HEAD: Normocephalic, atraumatic. Facial swelling more prominent left face involving the eyelids EYES: Pupils equal, round, reactive to light. Extraocular muscles intact. No scleral icterus.  MOUTH: Moist mucosal membrane. Dentition intact. No abscess noted.  EAR, NOSE, THROAT: Clear without exudates. No external lesions.  NECK: Supple. No thyromegaly. No nodules. No JVD.  PULMONARY: Clear to ascultation, without wheeze rails or rhonci. No use of accessory muscles, Good respiratory effort. good air entry bilaterally CHEST: Nontender to palpation.  CARDIOVASCULAR: S1 and S2. Regular rate and rhythm. No murmurs, rubs, or gallops. No edema. Pedal pulses 2+ bilaterally.  GASTROINTESTINAL: Soft, nontender, nondistended. No masses. Positive bowel sounds. No hepatosplenomegaly.  MUSCULOSKELETAL: No swelling, clubbing, or edema. Range of motion full in all extremities.  NEUROLOGIC: Cranial nerves II through XII are intact. No gross focal neurological deficits. Sensation intact. Reflexes intact.  SKIN: Erythema left forehead otherwise No ulceration, lesions, rashes, or cyanosis. Skin warm and  dry. Turgor intact.  PSYCHIATRIC: Mood, affect within normal limits. The patient is drowsy but arousable Insight, judgment intact.      LABORATORY PANEL:   CBC  Recent Labs Lab 09/17/15 0331  WBC 9.1  HGB 11.9*  HCT 35.4  PLT 167   ------------------------------------------------------------------------------------------------------------------  Chemistries   Recent Labs Lab 09/16/15 1641 09/17/15 0331  NA 140 137  K 3.6 3.8  CL 110 111  CO2 26 23  GLUCOSE 97 105*  BUN 10 8  CREATININE 0.68 0.71  CALCIUM 8.7* 8.4*  AST 34  --   ALT 24  --   ALKPHOS 67  --   BILITOT 0.3  --    ------------------------------------------------------------------------------------------------------------------  Cardiac Enzymes No results for input(s): TROPONINI in the last 168 hours. ------------------------------------------------------------------------------------------------------------------  RADIOLOGY:  Ct Head Wo Contrast  09/16/2015  CLINICAL DATA:  LEFT-sided facial swelling after awakening earlier today. Knot on LEFT forehead. EXAM: CT HEAD WITHOUT CONTRAST CT MAXILLOFACIAL WITH CONTRAST TECHNIQUE: Multidetector CT imaging of the head was performed without contrast and maxillofacial structures was performed using the standard protocol with intravenous contrast. Multiplanar CT image reconstructions of the maxillofacial structures were also generated. COMPARISON:  12/31/2014. CONTRAST:  75 mL Isovue-300 FINDINGS: CT HEAD FINDINGS No evidence for acute infarction, hemorrhage, mass lesion, hydrocephalus, or extra-axial fluid. Normal cerebral volume. No white matter disease. Calvarium intact. CT MAXILLOFACIAL FINDINGS The maxillary, ethmoid, sphenoid, and frontal sinuses are clear. There is no middle ear or mastoid fluid. The orbits are negative. There is frontal soft tissue swelling, LEFT paramedian prominence, as seen for instance on image 8 series 4. No underlying osseous or  paranasal sinus inflammatory process. The adjacent orbits are negative. Over the LEFT cheek, there is thickening of the platysma, and cellulitis of the subcutaneous fat overlying the masseter muscle. No submandibular gland inflammation is seen. There may be mild asymmetric enhancement of the LEFT parotid tail. Correlate clinically for acute LEFT parotitis. Stranding of the fat does extend to the LEFT masticator space. No calculi are evident. There does not appear to be acute periodontal disease or dental caries. An asymmetric level II lymph node is not pathologically enlarged, 6 mm short axis. Retroantral fat of the maxillary sinuses is preserved. No visible tonsillitis or adenoidal enlargement. No evidence for eustachian tube dysfunction. No osseous inflammatory process of the skull base or upper cervical region. IMPRESSION: Negative CT head. Cellulitis involving the LEFT paramedian forehead, as well as the LEFT cheek and LEFT masticator space without visible abscess. No submandibular gland information is observed but there may be slight asymmetric enhancement of the tail of the LEFT parotid; it is not clear this is primary versus secondary. No acute sinusitis or temporal bone inflammation. No periodontal disease is evident. Electronically Signed   By: Elsie Stain M.D.   On: 09/16/2015 18:32   Mr Laqueta Jean WG Contrast  09/16/2015  CLINICAL DATA:  40 year old female with dizziness, disoriented double vision, swelling left side of forehead and jaw. Initial encounter. EXAM: MRI HEAD WITHOUT AND WITH CONTRAST TECHNIQUE: Multiplanar, multiecho pulse sequences of the brain and surrounding structures were obtained without and with intravenous contrast. CONTRAST:  14mL MULTIHANCE GADOBENATE DIMEGLUMINE 529 MG/ML IV SOLN COMPARISON:  CT head and face from 1807 hours today. Brain MRI 05/11/2014. FINDINGS: Major intracranial vascular flow voids are stable and within normal limits. Cerebral volume is normal. No restricted  diffusion to suggest acute infarction.  No midline shift, mass effect, evidence of mass lesion, ventriculomegaly, extra-axial collection or acute intracranial hemorrhage. Cervicomedullary junction and pituitary are within normal limits. Negative visualized cervical spine. Wallace CullensGray and white matter signal remains within normal limits throughout the brain. No abnormal intracranial enhancement identified. No dural thickening. Visible internal auditory structures appear normal. Mastoids are clear. Paranasal sinuses are clear. There is soft tissue swelling about the forehead, eccentric to the left, an the bridge of the nose, with patchy hyper enhancement. Globes and intraorbital soft tissues appear to remain normal. No associated fluid collection. The parotid glands appear normal by MRI. Normal bone marrow signal. IMPRESSION: 1. Stable and normal MRI appearance of the brain. 2. Forehead soft tissue swelling and enhancement compatible with cellulitis. No associated fluid collection. Underlying paranasal sinuses and orbits appear to remain normal. Electronically Signed   By: Odessa FlemingH  Hall M.D.   On: 09/16/2015 21:07   Ct Maxillofacial W/cm  09/16/2015  CLINICAL DATA:  LEFT-sided facial swelling after awakening earlier today. Knot on LEFT forehead. EXAM: CT HEAD WITHOUT CONTRAST CT MAXILLOFACIAL WITH CONTRAST TECHNIQUE: Multidetector CT imaging of the head was performed without contrast and maxillofacial structures was performed using the standard protocol with intravenous contrast. Multiplanar CT image reconstructions of the maxillofacial structures were also generated. COMPARISON:  12/31/2014. CONTRAST:  75 mL Isovue-300 FINDINGS: CT HEAD FINDINGS No evidence for acute infarction, hemorrhage, mass lesion, hydrocephalus, or extra-axial fluid. Normal cerebral volume. No white matter disease. Calvarium intact. CT MAXILLOFACIAL FINDINGS The maxillary, ethmoid, sphenoid, and frontal sinuses are clear. There is no middle ear or mastoid  fluid. The orbits are negative. There is frontal soft tissue swelling, LEFT paramedian prominence, as seen for instance on image 8 series 4. No underlying osseous or paranasal sinus inflammatory process. The adjacent orbits are negative. Over the LEFT cheek, there is thickening of the platysma, and cellulitis of the subcutaneous fat overlying the masseter muscle. No submandibular gland inflammation is seen. There may be mild asymmetric enhancement of the LEFT parotid tail. Correlate clinically for acute LEFT parotitis. Stranding of the fat does extend to the LEFT masticator space. No calculi are evident. There does not appear to be acute periodontal disease or dental caries. An asymmetric level II lymph node is not pathologically enlarged, 6 mm short axis. Retroantral fat of the maxillary sinuses is preserved. No visible tonsillitis or adenoidal enlargement. No evidence for eustachian tube dysfunction. No osseous inflammatory process of the skull base or upper cervical region. IMPRESSION: Negative CT head. Cellulitis involving the LEFT paramedian forehead, as well as the LEFT cheek and LEFT masticator space without visible abscess. No submandibular gland information is observed but there may be slight asymmetric enhancement of the tail of the LEFT parotid; it is not clear this is primary versus secondary. No acute sinusitis or temporal bone inflammation. No periodontal disease is evident. Electronically Signed   By: Elsie StainJohn T Curnes M.D.   On: 09/16/2015 18:32    EKG:   Orders placed or performed during the hospital encounter of 09/16/15  . EKG 12-Lead  . EKG 12-Lead    ASSESSMENT AND PLAN:   40 year old Caucasian female admitted 09/16/15 with sepsis secondary to cellulitis  1. Sepsis secondary to facial cellulitis: Meeting septic criteria on admission secondary to tachycardia and leukocytosis currently on vancomycin/meropenem. I see no indication for this large dosage of antibiotics will follow culture  data decreased likely discontinue meropenem potentially changing everything to Keflex versus clindamycin. 2. Hypotension: The patient's blood pressure marginal IV fluid bolus  normal saline and continue IV fluid hydration likely secondary to sepsis as above 3. Somnolence: Decreased standing dose of Ativan decreased pain medications I believe this is most likely secondary to overmedication would avoid further sedating agents 4. Venous thromboembolism prophylactic: Lovenox  Disposition: Suspect she she will not require any home health needs   All the records are reviewed and case discussed with Care Management/Social Workerr. Management plans discussed with the patient, family and they are in agreement.  CODE STATUS: Full  TOTAL TIME TAKING CARE OF THIS PATIENT: 33 minutes.   POSSIBLE D/C IN 2-3 DAYS, DEPENDING ON CLINICAL CONDITION.   Hower,  Mardi Mainland.D on 09/17/2015 at 11:43 AM  Between 7am to 6pm - Pager - [phone]  After 6pm: House Pager: - [phone]  [facility]  Office  [phone]  CC: Primary care physician; Brayton El, MD

## 2015-09-17 NOTE — Progress Notes (Addendum)
Dr hower notified of low bp and dizziness,lethargy notified after lyrica.the patient on high doses of antianxietal agentspt not on maintenance ivf.md to place orders.

## 2015-09-18 DIAGNOSIS — F3130 Bipolar disorder, current episode depressed, mild or moderate severity, unspecified: Secondary | ICD-10-CM

## 2015-09-18 DIAGNOSIS — F413 Other mixed anxiety disorders: Secondary | ICD-10-CM

## 2015-09-18 MED ORDER — LORAZEPAM 1 MG PO TABS: 1.0000 mg | ORAL_TABLET | Freq: Four times a day (QID) | ORAL | Status: DC | PRN

## 2015-09-18 MED ORDER — LORAZEPAM 2 MG PO TABS
0.0000 mg | ORAL_TABLET | Freq: Four times a day (QID) | ORAL | Status: DC
  Administered 2015-09-18 (×2): 2 mg via ORAL
  Filled 2015-09-18 (×2): qty 1

## 2015-09-18 MED ORDER — CHLORDIAZEPOXIDE HCL 10 MG PO CAPS
10.0000 mg | ORAL_CAPSULE | Freq: Three times a day (TID) | ORAL | Status: DC
  Administered 2015-09-18: 10 mg via ORAL
  Filled 2015-09-18 (×2): qty 1

## 2015-09-18 MED ORDER — LORAZEPAM 2 MG/ML IJ SOLN
INTRAMUSCULAR | Status: AC
Start: 2015-09-18 — End: 2015-09-18
  Administered 2015-09-18: 1 mg via INTRAVENOUS
  Filled 2015-09-18: qty 1

## 2015-09-18 MED ORDER — QUETIAPINE FUMARATE 25 MG PO TABS
25.0000 mg | ORAL_TABLET | Freq: Every day | ORAL | Status: DC
  Filled 2015-09-18: qty 1

## 2015-09-18 MED ORDER — SERTRALINE HCL 50 MG PO TABS
25.0000 mg | ORAL_TABLET | Freq: Every day | ORAL | Status: DC
  Administered 2015-09-18: 25 mg via ORAL
  Filled 2015-09-18: qty 1

## 2015-09-18 MED ORDER — THIAMINE HCL 100 MG/ML IJ SOLN: 100.0000 mg | Freq: Every day | INTRAMUSCULAR | Status: DC

## 2015-09-18 MED ORDER — FOLIC ACID 1 MG PO TABS
1.0000 mg | ORAL_TABLET | Freq: Every day | ORAL | Status: DC
  Administered 2015-09-18: 1 mg via ORAL
  Filled 2015-09-18: qty 1

## 2015-09-18 MED ORDER — ADULT MULTIVITAMIN W/MINERALS CH
1.0000 | ORAL_TABLET | Freq: Every day | ORAL | Status: DC
  Administered 2015-09-18: 1 via ORAL
  Filled 2015-09-18: qty 1

## 2015-09-18 MED ORDER — HALOPERIDOL LACTATE 5 MG/ML IJ SOLN: 5.0000 mg | Freq: Four times a day (QID) | INTRAMUSCULAR | Status: DC | PRN

## 2015-09-18 MED ORDER — HALOPERIDOL LACTATE 5 MG/ML IJ SOLN: 5.0000 mg | Freq: Once | INTRAMUSCULAR | Status: DC

## 2015-09-18 MED ORDER — LORAZEPAM 2 MG/ML IJ SOLN
INTRAMUSCULAR | Status: AC
Start: 2015-09-18 — End: 2015-09-18
  Filled 2015-09-18: qty 1

## 2015-09-18 MED ORDER — LORAZEPAM 2 MG PO TABS: 0.0000 mg | ORAL_TABLET | Freq: Two times a day (BID) | ORAL | Status: DC

## 2015-09-18 MED ORDER — CLINDAMYCIN HCL 150 MG PO CAPS
300.0000 mg | ORAL_CAPSULE | Freq: Four times a day (QID) | ORAL | Status: DC
  Administered 2015-09-18: 300 mg via ORAL
  Filled 2015-09-18: qty 2

## 2015-09-18 MED ORDER — LORAZEPAM 2 MG/ML IJ SOLN: 2.0000 mg | Freq: Once | INTRAMUSCULAR | Status: DC

## 2015-09-18 MED ORDER — LORAZEPAM 2 MG/ML IJ SOLN
1.0000 mg | Freq: Four times a day (QID) | INTRAMUSCULAR | Status: DC | PRN
  Administered 2015-09-18 (×2): 1 mg via INTRAVENOUS
  Filled 2015-09-18: qty 1

## 2015-09-18 MED ORDER — VITAMIN B-1 100 MG PO TABS
100.0000 mg | ORAL_TABLET | Freq: Every day | ORAL | Status: DC
  Administered 2015-09-18: 100 mg via ORAL
  Filled 2015-09-18: qty 1

## 2015-09-18 NOTE — Progress Notes (Signed)
Patient states that she has been squeezing red place on forehead and that pus came out. Advised patient to leave this area alone and reinforced hand hygiene.

## 2015-09-18 NOTE — Progress Notes (Signed)
Nursing supervisor Ann at bedside to assess patient. Casey Hatchnn states that patient is not disoriented and very argumentative.Patient refuses to stay and take medication. Prime doc paged Dr. Anne HahnWillis. Dr. Anne HahnWillis states that patient can leave AMA but will not get any prescriptions. Education giving to patient about safety and reasoning on why she should stay. Patient still refuses to stay. Patient belongings given back to patient. Patient denies harming herself or others no IVC orders.

## 2015-09-18 NOTE — Progress Notes (Signed)
Reassess patient - hallucinations with intermittent lethargy -- at this time would not be able to leave AMA Currently patient does not want to leave -- If does will need IVC Psych to eval

## 2015-09-18 NOTE — Progress Notes (Addendum)
rn  Asked patient what meds were in here purse . Pt handed bottle of zantac,xanax .5mg ,motrin  And empty bottle of percocet. Pt reports "i have no more pill bottles in my purse"

## 2015-09-18 NOTE — Progress Notes (Addendum)
Patient agitated this morning rummaging through her purse. Charge nurse bedside found pill bottles in pt purse and on the floor. Sent loose pill found on floor to pharmacy to identify. Patient became agitated and through tray all over floor. Patient becoming abusive Code 300 called.  Patient wants to leave AMA. Dr. Clint GuyHower called and came to her bedside and talked at length with her. Signed AMA paperwork. Patient ready to leave and said her father was at her door to take her home but no one there.  Pharmacy called back and confirmed xanax tablets.

## 2015-09-18 NOTE — Progress Notes (Signed)
Patient agitated that she cannot use her vape at bedside or take her xanax I have explained she can have a nicotine patch and we are currently administering her medications for anxiety/withdrawal  She states she wants to leave the hospital I have advised against and informed her that decision may worsen her infection/health  She understands these risks  AMA papers signed at bedside

## 2015-09-18 NOTE — Progress Notes (Signed)
Upon entering patients room. Patient verbally aggressive, and attempting to throw dinner tray. Patient states that she does not want staff in her room or to be at the hospital. Safety sitter at bedside. Patient education giving to patient with slight improvement. Per report Reeves DamJadie MD D/C tele and why patient is not on tele.

## 2015-09-18 NOTE — Progress Notes (Signed)
Security alert called: House keeping/nursing staff found "pills" hidden behind the patient's bed when asked about that she became acutely agitated and combative throwing things. She is to receive Haldol/Ativan.

## 2015-09-18 NOTE — Progress Notes (Signed)
Called Dr. Clint GuyHower because patient very agitated and abusive. He ordered 0.5mg  haldol and 2mg  lorazepam IV one time dose.

## 2015-09-18 NOTE — Progress Notes (Signed)
Patient expressed to nurse tech that she had lied to the doctor's about her state of mind. She stated that "I am hearing voices and seeing people" & "I want to die" & "I don't care about myself or who I hurt. I hope the infections kills me so I can die"   Called Dr. Clint GuyHower & Dr. Garnetta BuddyFaheem. Dr Clint GuyHower put in order for suicide precautions and said to talk to Dr. Garnetta BuddyFaheem regarding IVC. Dr. Garnetta BuddyFaheem said she has left for the day and that an IVC would have to come from Dr. Clint GuyHower. Spoke to Dr. Clint GuyHower and he said that if patient isn't resistant to staying at the hospital there would be no need to IVC at this time. Said he has left for the day also.

## 2015-09-18 NOTE — Progress Notes (Signed)
[facility] - [place] at [facility]   PATIENT NAME: Casey Hodges    MR#:  [id number]  DATE OF BIRTH:  06-03-1976  SUBJECTIVE:  Afebrile overnight Multiple other psychiatric issues as noted in my earlier notes Now with hallucinations - sees bugs, and is confused  REVIEW OF SYSTEMS:  CONSTITUTIONAL: No fever, positive fatigue or weakness.  EYES: No blurred or double vision.  EARS, NOSE, AND THROAT: No tinnitus or ear pain.  RESPIRATORY: No cough, shortness of breath, wheezing or hemoptysis.  CARDIOVASCULAR: No chest pain, orthopnea, edema.  GASTROINTESTINAL: No nausea, vomiting, diarrhea or abdominal pain.  GENITOURINARY: No dysuria, hematuria.  ENDOCRINE: No polyuria, nocturia,  HEMATOLOGY: No anemia, easy bruising or bleeding SKIN: No rash or lesion. MUSCULOSKELETAL: No joint pain or arthritis.   NEUROLOGIC: No tingling, numbness, weakness.  PSYCHIATRY: No anxiety positive depression.   DRUG ALLERGIES:   Allergies  Allergen Reactions  . Wellbutrin [Bupropion Hcl] Other (See Comments)    Reaction:  Suicidal   . Augmentin [Amoxicillin-Pot Clavulanate] Diarrhea, Nausea And Vomiting and Other (See Comments)    Has patient had a PCN reaction causing immediate rash, facial/tongue/throat swelling, SOB or lightheadedness with hypotension: No Has patient had a PCN reaction causing severe rash involving mucus membranes or skin necrosis: No Has patient had a PCN reaction that required hospitalization No Has patient had a PCN reaction occurring within the last 10 years: Yes If all of the above answers are "NO", then may proceed with Cephalosporin use.  Marland Kitchen Hydrocodone-Acetaminophen Nausea And Vomiting  . Penicillins Diarrhea, Nausea And Vomiting and Other (See Comments)    Has patient had a PCN reaction causing immediate rash, facial/tongue/throat swelling, SOB or lightheadedness with hypotension: No Has patient had a PCN reaction causing severe rash involving  mucus membranes or skin necrosis: No Has patient had a PCN reaction that required hospitalization No Has patient had a PCN reaction occurring within the last 10 years: Yes If all of the above answers are "NO", then may proceed with Cephalosporin use.  [name]  [Hydrocodone-Ibuprofen] Nausea And Vomiting  . Lasix [Furosemide] Rash  . Nitrofurantoin Monohyd Macro Rash    VITALS:  Blood pressure 131/75, pulse 99, temperature 99 F (37.2 C), temperature source Oral, resp. rate 16, height 5\' 2"  (1.575 m), weight 74.481 kg (164 lb 3.2 oz), last menstrual period 09/16/2015, SpO2 99 %.  PHYSICAL EXAMINATION:  VITAL SIGNS: Filed Vitals:   09/18/15 0802 09/18/15 1139  BP: 124/80 131/75  Pulse: 101 99  Temp: 98.6 F (37 C) 99 F (37.2 C)  Resp: 16 16   GENERAL:40 y.o.female currently in minimal acute distress. Given mental status HEAD: Normocephalic, atraumatic. Facial swelling more prominent left face involving the eyelids EYES: Pupils equal, round, reactive to light. Extraocular muscles intact. No scleral icterus.  MOUTH: Moist mucosal membrane. Dentition intact. No abscess noted.  EAR, NOSE, THROAT: Clear without exudates. No external lesions.  NECK: Supple. No thyromegaly. No nodules. No JVD.  PULMONARY: Clear to ascultation, without wheeze rails or rhonci. No use of accessory muscles, Good respiratory effort. good air entry bilaterally CHEST: Nontender to palpation.  CARDIOVASCULAR: S1 and S2. Regular rate and rhythm. No murmurs, rubs, or gallops. No edema. Pedal pulses 2+ bilaterally.  GASTROINTESTINAL: Soft, nontender, nondistended. No masses. Positive bowel sounds. No hepatosplenomegaly.  MUSCULOSKELETAL: No swelling, clubbing, or edema. Range of motion full in all extremities.  NEUROLOGIC: Cranial nerves II through XII are intact. No gross focal neurological deficits. Sensation intact. Reflexes  intact.  SKIN: Erythema left forehead with centralized lesion otherwise No  ulceration, lesions, rashes, or cyanosis. Skin warm and dry. Turgor intact.  PSYCHIATRIC: Mood, affect blunted. The patient is drowsy but arousable Insight, judgment poor at this time.      LABORATORY PANEL:   CBC  Recent Labs Lab 09/17/15 0331  WBC 9.1  HGB 11.9*  HCT 35.4  PLT 167   ------------------------------------------------------------------------------------------------------------------  Chemistries   Recent Labs Lab 09/16/15 1641 09/17/15 0331  NA 140 137  K 3.6 3.8  CL 110 111  CO2 26 23  GLUCOSE 97 105*  BUN 10 8  CREATININE 0.68 0.71  CALCIUM 8.7* 8.4*  AST 34  --   ALT 24  --   ALKPHOS 67  --   BILITOT 0.3  --    ------------------------------------------------------------------------------------------------------------------  Cardiac Enzymes No results for input(s): TROPONINI in the last 168 hours. ------------------------------------------------------------------------------------------------------------------  RADIOLOGY:  Ct Head Wo Contrast  09/16/2015  CLINICAL DATA:  LEFT-sided facial swelling after awakening earlier today. Knot on LEFT forehead. EXAM: CT HEAD WITHOUT CONTRAST CT MAXILLOFACIAL WITH CONTRAST TECHNIQUE: Multidetector CT imaging of the head was performed without contrast and maxillofacial structures was performed using the standard protocol with intravenous contrast. Multiplanar CT image reconstructions of the maxillofacial structures were also generated. COMPARISON:  12/31/2014. CONTRAST:  75 mL Isovue-300 FINDINGS: CT HEAD FINDINGS No evidence for acute infarction, hemorrhage, mass lesion, hydrocephalus, or extra-axial fluid. Normal cerebral volume. No white matter disease. Calvarium intact. CT MAXILLOFACIAL FINDINGS The maxillary, ethmoid, sphenoid, and frontal sinuses are clear. There is no middle ear or mastoid fluid. The orbits are negative. There is frontal soft tissue swelling, LEFT paramedian prominence, as seen for instance  on image 8 series 4. No underlying osseous or paranasal sinus inflammatory process. The adjacent orbits are negative. Over the LEFT cheek, there is thickening of the platysma, and cellulitis of the subcutaneous fat overlying the masseter muscle. No submandibular gland inflammation is seen. There may be mild asymmetric enhancement of the LEFT parotid tail. Correlate clinically for acute LEFT parotitis. Stranding of the fat does extend to the LEFT masticator space. No calculi are evident. There does not appear to be acute periodontal disease or dental caries. An asymmetric level II lymph node is not pathologically enlarged, 6 mm short axis. Retroantral fat of the maxillary sinuses is preserved. No visible tonsillitis or adenoidal enlargement. No evidence for eustachian tube dysfunction. No osseous inflammatory process of the skull base or upper cervical region. IMPRESSION: Negative CT head. Cellulitis involving the LEFT paramedian forehead, as well as the LEFT cheek and LEFT masticator space without visible abscess. No submandibular gland information is observed but there may be slight asymmetric enhancement of the tail of the LEFT parotid; it is not clear this is primary versus secondary. No acute sinusitis or temporal bone inflammation. No periodontal disease is evident. Electronically Signed   By: Elsie Stain M.D.   On: 09/16/2015 18:32   Mr Laqueta Jean ZH Contrast  09/16/2015  CLINICAL DATA:  41 year old female with dizziness, disoriented double vision, swelling left side of forehead and jaw. Initial encounter. EXAM: MRI HEAD WITHOUT AND WITH CONTRAST TECHNIQUE: Multiplanar, multiecho pulse sequences of the brain and surrounding structures were obtained without and with intravenous contrast. CONTRAST:  14mL MULTIHANCE GADOBENATE DIMEGLUMINE 529 MG/ML IV SOLN COMPARISON:  CT head and face from 1807 hours today. Brain MRI 05/11/2014. FINDINGS: Major intracranial vascular flow voids are stable and within normal  limits. Cerebral volume is normal. No  restricted diffusion to suggest acute infarction. No midline shift, mass effect, evidence of mass lesion, ventriculomegaly, extra-axial collection or acute intracranial hemorrhage. Cervicomedullary junction and pituitary are within normal limits. Negative visualized cervical spine. Wallace CullensGray and white matter signal remains within normal limits throughout the brain. No abnormal intracranial enhancement identified. No dural thickening. Visible internal auditory structures appear normal. Mastoids are clear. Paranasal sinuses are clear. There is soft tissue swelling about the forehead, eccentric to the left, an the bridge of the nose, with patchy hyper enhancement. Globes and intraorbital soft tissues appear to remain normal. No associated fluid collection. The parotid glands appear normal by MRI. Normal bone marrow signal. IMPRESSION: 1. Stable and normal MRI appearance of the brain. 2. Forehead soft tissue swelling and enhancement compatible with cellulitis. No associated fluid collection. Underlying paranasal sinuses and orbits appear to remain normal. Electronically Signed   By: Odessa FlemingH  Hall M.D.   On: 09/16/2015 21:07   Ct Maxillofacial W/cm  09/16/2015  CLINICAL DATA:  LEFT-sided facial swelling after awakening earlier today. Knot on LEFT forehead. EXAM: CT HEAD WITHOUT CONTRAST CT MAXILLOFACIAL WITH CONTRAST TECHNIQUE: Multidetector CT imaging of the head was performed without contrast and maxillofacial structures was performed using the standard protocol with intravenous contrast. Multiplanar CT image reconstructions of the maxillofacial structures were also generated. COMPARISON:  12/31/2014. CONTRAST:  75 mL Isovue-300 FINDINGS: CT HEAD FINDINGS No evidence for acute infarction, hemorrhage, mass lesion, hydrocephalus, or extra-axial fluid. Normal cerebral volume. No white matter disease. Calvarium intact. CT MAXILLOFACIAL FINDINGS The maxillary, ethmoid, sphenoid, and frontal  sinuses are clear. There is no middle ear or mastoid fluid. The orbits are negative. There is frontal soft tissue swelling, LEFT paramedian prominence, as seen for instance on image 8 series 4. No underlying osseous or paranasal sinus inflammatory process. The adjacent orbits are negative. Over the LEFT cheek, there is thickening of the platysma, and cellulitis of the subcutaneous fat overlying the masseter muscle. No submandibular gland inflammation is seen. There may be mild asymmetric enhancement of the LEFT parotid tail. Correlate clinically for acute LEFT parotitis. Stranding of the fat does extend to the LEFT masticator space. No calculi are evident. There does not appear to be acute periodontal disease or dental caries. An asymmetric level II lymph node is not pathologically enlarged, 6 mm short axis. Retroantral fat of the maxillary sinuses is preserved. No visible tonsillitis or adenoidal enlargement. No evidence for eustachian tube dysfunction. No osseous inflammatory process of the skull base or upper cervical region. IMPRESSION: Negative CT head. Cellulitis involving the LEFT paramedian forehead, as well as the LEFT cheek and LEFT masticator space without visible abscess. No submandibular gland information is observed but there may be slight asymmetric enhancement of the tail of the LEFT parotid; it is not clear this is primary versus secondary. No acute sinusitis or temporal bone inflammation. No periodontal disease is evident. Electronically Signed   By: Elsie StainJohn T Curnes M.D.   On: 09/16/2015 18:32    EKG:   Orders placed or performed during the hospital encounter of 09/16/15  . EKG 12-Lead  . EKG 12-Lead    ASSESSMENT AND PLAN:   40 year old Caucasian female admitted 09/16/15 with sepsis secondary to cellulitis  1. Sepsis secondary to facial cellulitis: Meeting septic criteria on admission secondary to tachycardia and leukocytosis- cultures thus far negative - change to oral clindamycin 2.  Hypotension: improvedabove 3. etoh withdrawal: ciwa 4. Delirium: patient self administering xanax, lyrica, other hidden medications in room - psych consulted this  morning --  4. Venous thromboembolism prophylactic: Lovenox  Disposition: Suspect she she will not require any home health needs, question benefit from psychiatric services   All the records are reviewed and case discussed with Care Management/Social Workerr. Management plans discussed with the patient, family and they are in agreement.  CODE STATUS: Full  TOTAL TIME TAKING CARE OF THIS PATIENT: total time today 120 minutes.   POSSIBLE D/C IN 2-3 DAYS, DEPENDING ON CLINICAL CONDITION.   Jaire Pinkham,  Mardi Mainland.D on 09/18/2015 at 2:48 PM  Between 7am to 6pm - Pager - [phone]  After 6pm: House Pager: - [phone]  [facility]  Office  [phone]  CC: Primary care physician; Brayton El, MD

## 2015-09-18 NOTE — Progress Notes (Signed)
LCSW Arrie SenateClaudine Darrell Hauk [phone] Consulted with Doctor Hower and he will re-assess. Called and spoke directly with Dr Garnetta BuddyFaheem to consult with patient.

## 2015-09-18 NOTE — Progress Notes (Signed)
Patients belongings sent to security and purse stored in med room

## 2015-09-18 NOTE — Progress Notes (Signed)
Patient leaving, off unit

## 2015-09-18 NOTE — Progress Notes (Signed)
rn called to pt's room after environmental service  Found 5 pills behind  trashcan .pills were pink. Pills sent to pharmacy for verification

## 2015-09-18 NOTE — Consult Note (Signed)
[facility] Consult   Reason for Consult:  Depression and Hallucination  Referring Physician:  Nadara Mustard Patient Identification: Casey Hodges MRN:  [id number] Principal Diagnosis: MDD, recurrent severe w/o                                      Sedative, hypnotic abuse  Diagnosis:   Patient Active Problem List   Diagnosis Date Noted  . GERD (gastroesophageal reflux disease) [K21.9] 09/16/2015  . Sepsis ([place]) [A41.9] 09/16/2015  . Facial cellulitis [I62.703] 09/16/2015  . Cervical spine pain [M54.2] 09/08/2015  . Smoker [Z72.0] 07/25/2015  . Hypotension [I95.9] 07/25/2015  . Upper respiratory infection [J06.9] 07/12/2015  . Snoring [R06.83] 06/09/2015  . Cardiac murmur [R01.1] 05/25/2015  . Cloudy urine [R82.99] 05/25/2015  . Numbness of foot [R20.8] 04/20/2015  . Drug-induced nausea and vomiting [R11.2] 03/22/2015  . Need for immunization against influenza [Z23] 03/21/2015  . Cyst, bone [M85.60] 02/14/2015  . Pelvic adhesive disease [N73.6] 01/21/2015  . Tick bite [T14.8, W57.XXXA] 01/20/2015  . Arthritis [M19.90] 12/19/2014  . Calculus of kidney [N20.0] 12/13/2014  . Patchy loss of hair [L65.9] 12/13/2014  . Anxiety disorder [F41.9] 12/13/2014  . Postural dizziness [R42] 12/13/2014  . Subcutaneous cyst [L72.9] 12/13/2014  . Nephrolithiasis [N20.0] 05/23/2012  . Chronic pelvic pain in female [N94.9, G89.29] 05/23/2012  . Fibromyalgia [M79.7] 05/23/2012  . Bipolar disorder with depression ([place]) [F31.30] 06/05/2011    Total Time spent with patient: 1 hour  Subjective:   Casey Hodges is a 40 y.o. female patient admitted with Casey Hodges is a 40 y.o. female who presents with facial swelling and tenderness, as well as some cold chills.   HPI:    Casey Hodges is a 40 y.o. widowed female who presents with facial swelling and tenderness, as well as some cold chills. Patient was evaluated in her room and most of the history was obtained from the patient as well  as review of the chart. She was admitted due to facial swelling and erythema and tenderness.  According to the records, she came to the walk-in clinic and throughout the day has also had some progressive swelling of her left jaw area. In walk-in clinic this or she had a low-grade fever and sent her to the ED for scan. CT scan showed consistent with facial cellulitis without abscess, without significant involvement in other facial structures, except for one comment about possible mild parotid gland inflammation. Per ED provider has progressed even in the short time. She has tachycardia and an elevated white blood cell count. Hospitals were called for admission for sepsis due to facial cellulitis.  While patient was in the hospital staff noted that she has taken extra amount of her Xanax pills. Some of them were found laying on the ground next to her garbage. Her pill bottle was sent to the pharmacy and they were counted  During my interview patient appeared anxious and depressed. She reported that she has been feeling depressed due to the death anniversary of her husband who died 1 year ago. She has been struggling all year long. She reported that she has a 19 year old daughter who is a Equities trader in high school and she wants to be discharged as she has to take care of her. Patient reported that last year she was at home with her husband and she woke up in normal morning, Made coffee for herself and was  watching TV. Her husband was laying in the bed and she did not realize that he is actually sick and was bleeding in the bed. She reported that when she went to wake him up as he had a doctor's appointment he was already bleeding. She was feeling guilty and was crying during the interview. She reported that she tried to help him but then he died on her. Patient reported that she cannot forgive herself. She has been struggling all year long. She reported that she follows with a psychiatrist at [facility] and they have had  adjusted her medications and she was recently started on Seroquel after discontinuing the Saphris She reported that she also takes Xanax XR 1 mg at bedtime due to her anxiety. She was diagnosed with cognitive impairment and had the CT scan done. She reported that she has also tried taking Lexapro but it made her very tired  I also obtained collateral information from her mother. She reported that patient was tried on Wellbutrin which made her suicidal. Her mother wants her to be off of the psychotropic medications as she reported that most of these medications are making her very tired and she is not in her right state of mind. She reported that patient has done well on the very small dose of Zoloft in the past. She reported that patient is currently struggling due to the death anniversary of her husband and is depressed and anxious. Her mother is very supportive and reported that she will retire on April 21 and will be providing her support and encouragement to start seeing a counselor on a regular basis.  Patient does not have any suicidal homicidal ideations or plans at this time. She wants to be present for her daughter.      Past Psychiatric History:   Patient has been diagnosed with bipolar disorder [facility]. She reported that she has never attempted suicide. She denied having any suicidal ideations or plans at this time.   Risk to Self: Is patient at risk for suicide?: No Risk to Others:   Prior Inpatient Therapy:   Prior Outpatient Therapy:    Past Medical History:  Past Medical History  Diagnosis Date  . Bipolar affective ([place])     pt reported  . Uterine polyp   . Eating disorder     Under control per patient  . Renal disorder   . Painful menstrual periods   . Abnormal vaginal Pap smear     10+ years ago- no colpo repeat was normal  . Painful intercourse   . GERD (gastroesophageal reflux disease)   . VWD (acquired von Willebrand's disease) ([place])   . Anxiety   . Spinal  stenosis   . Kidney stone   . Bipolar disorder ([place])   . AR (allergic rhinitis)   . Pelvic pain in female   . Fibromyalgia   . PTSD (post-traumatic stress disorder)     Past Surgical History  Procedure Laterality Date  . Tubal ligation    . Laparoscopy abdomen diagnostic    . Hysteroscopy      removed polyps  . Laparoscopic vaginal hysterectomy  2015    at [facility]  . Abdominal hysterectomy    . Laparoscopy Left 01/10/2015    Procedure: LAPAROSCOPY OPERATIVE with biopsy, left oopherectomy;  Surgeon: Brayton Mars, MD;  Location: [facility] ORS;  Service: Gynecology;  Laterality: Left;   Family History:  Family History  Problem Relation Age of Onset  . Breast cancer Paternal Grandmother   .  Diabetes Maternal Grandmother   . Hypertension Father   . Sleep apnea Mother   . Diabetes Maternal Aunt   . Diabetes Maternal Uncle    Family Psychiatric  History: she  denied any history of psychiatric illness in the family  Social History:  History  Alcohol Use  . 0.0 oz/week  . 0 Standard drinks or equivalent per week    Comment: Socially     History  Drug Use No    Social History   Social History  . Marital Status: Widowed    Spouse Name: N/A  . Number of Children: 1  . Years of Education: N/A   Occupational History  . Un-employed due to bipolar    Social History Main Topics  . Smoking status: Current Every Day Smoker -- 0.75 packs/day    Types: Cigarettes    Start date: 05/18/1995  . Smokeless tobacco: Never Used  . Alcohol Use: 0.0 oz/week    0 Standard drinks or equivalent per week     Comment: Socially  . Drug Use: No  . Sexual Activity: No   Other Topics Concern  . None   Social History Narrative   Regular Exercise -  NO   Daily Caffeine Use:  1 cup coffee in am      1 child, one step child         Additional Social History:    Allergies:   Allergies  Allergen Reactions  . Wellbutrin [Bupropion Hcl] Other (See Comments)    Reaction:   Suicidal   . Augmentin [Amoxicillin-Pot Clavulanate] Diarrhea, Nausea And Vomiting and Other (See Comments)    Has patient had a PCN reaction causing immediate rash, facial/tongue/throat swelling, SOB or lightheadedness with hypotension: No Has patient had a PCN reaction causing severe rash involving mucus membranes or skin necrosis: No Has patient had a PCN reaction that required hospitalization No Has patient had a PCN reaction occurring within the last 10 years: Yes If all of the above answers are "NO", then may proceed with Cephalosporin use.  Marland Kitchen Hydrocodone-Acetaminophen Nausea And Vomiting  . Penicillins Diarrhea, Nausea And Vomiting and Other (See Comments)    Has patient had a PCN reaction causing immediate rash, facial/tongue/throat swelling, SOB or lightheadedness with hypotension: No Has patient had a PCN reaction causing severe rash involving mucus membranes or skin necrosis: No Has patient had a PCN reaction that required hospitalization No Has patient had a PCN reaction occurring within the last 10 years: Yes If all of the above answers are "NO", then may proceed with Cephalosporin use.  Burtis Junes  [Hydrocodone-Ibuprofen] Nausea And Vomiting  . Lasix [Furosemide] Rash  . Nitrofurantoin Monohyd Macro Rash    Labs:  Results for orders placed or performed during the hospital encounter of 09/16/15 (from the past 48 hour(s))  CBC with Differential     Status: Abnormal   Collection Time: 09/16/15  4:41 PM  Result Value Ref Range   WBC 12.4 (H) 3.6 - 11.0 K/uL   RBC 4.20 3.80 - 5.20 MIL/uL   Hemoglobin 12.8 12.0 - 16.0 g/dL   HCT 38.1 35.0 - 47.0 %   MCV 90.7 80.0 - 100.0 fL   MCH 30.4 26.0 - 34.0 pg   MCHC 33.5 32.0 - 36.0 g/dL   RDW 13.6 11.5 - 14.5 %   Platelets 179 150 - 440 K/uL   Neutrophils Relative % 67 %   Neutro Abs 8.3 (H) 1.4 - 6.5 K/uL   Lymphocytes Relative  25 %   Lymphs Abs 3.1 1.0 - 3.6 K/uL   Monocytes Relative 7 %   Monocytes Absolute 0.8 0.2 - 0.9  K/uL   Eosinophils Relative 1 %   Eosinophils Absolute 0.1 0 - 0.7 K/uL   Basophils Relative 0 %   Basophils Absolute 0.0 0 - 0.1 K/uL  Comprehensive metabolic panel     Status: Abnormal   Collection Time: 09/16/15  4:41 PM  Result Value Ref Range   Sodium 140 135 - 145 mmol/L   Potassium 3.6 3.5 - 5.1 mmol/L   Chloride 110 101 - 111 mmol/L   CO2 26 22 - 32 mmol/L   Glucose, Bld 97 65 - 99 mg/dL   BUN 10 6 - 20 mg/dL   Creatinine, Ser 0.68 0.44 - 1.00 mg/dL   Calcium 8.7 (L) 8.9 - 10.3 mg/dL   Total Protein 6.5 6.5 - 8.1 g/dL   Albumin 3.4 (L) 3.5 - 5.0 g/dL   AST 34 15 - 41 U/L   ALT 24 14 - 54 U/L   Alkaline Phosphatase 67 38 - 126 U/L   Total Bilirubin 0.3 0.3 - 1.2 mg/dL   GFR calc non Af Amer >60 >60 mL/min   GFR calc Af Amer >60 >60 mL/min    Comment: (NOTE) The eGFR has been calculated using the CKD EPI equation. This calculation has not been validated in all clinical situations. eGFR's persistently <60 mL/min signify possible Chronic Kidney Disease.    Anion gap 4 (L) 5 - 15  Pregnancy, urine     Status: None   Collection Time: 09/16/15  5:25 PM  Result Value Ref Range   Preg Test, Ur NEGATIVE NEGATIVE  Urine Drug Screen, Qualitative ([facility] only)     Status: Abnormal   Collection Time: 09/16/15  5:25 PM  Result Value Ref Range   Tricyclic, Ur Screen NONE DETECTED NONE DETECTED   Amphetamines, Ur Screen NONE DETECTED NONE DETECTED   MDMA (Ecstasy)Ur Screen NONE DETECTED NONE DETECTED   Cocaine Metabolite,Ur [place] NONE DETECTED NONE DETECTED   Opiate, Ur Screen POSITIVE (A) NONE DETECTED   Phencyclidine (PCP) Ur S NONE DETECTED NONE DETECTED   Cannabinoid 50 Ng, Ur [place] NONE DETECTED NONE DETECTED   Barbiturates, Ur Screen NONE DETECTED NONE DETECTED   Benzodiazepine, Ur Scrn POSITIVE (A) NONE DETECTED   Methadone Scn, Ur NONE DETECTED NONE DETECTED    Comment: (NOTE) 170  Tricyclics, urine               Cutoff 1000 ng/mL 200  Amphetamines, urine             Cutoff  1000 ng/mL 300  MDMA (Ecstasy), urine           Cutoff 500 ng/mL 400  Cocaine Metabolite, urine       Cutoff 300 ng/mL 500  Opiate, urine                   Cutoff 300 ng/mL 600  Phencyclidine (PCP), urine      Cutoff 25 ng/mL 700  Cannabinoid, urine              Cutoff 50 ng/mL 800  Barbiturates, urine             Cutoff 200 ng/mL 900  Benzodiazepine, urine           Cutoff 200 ng/mL 1000 Methadone, urine  Cutoff 300 ng/mL 1100 1200 The urine drug screen provides only a preliminary, unconfirmed 1300 analytical test result and should not be used for non-medical 1400 purposes. Clinical consideration and professional judgment should 1500 be applied to any positive drug screen result due to possible 1600 interfering substances. A more specific alternate chemical method 1700 must be used in order to obtain a confirmed analytical result.  1800 Gas chromato graphy / mass spectrometry (GC/MS) is the preferred 1900 confirmatory method.   Culture, blood (routine x 2)     Status: None (Preliminary result)   Collection Time: 09/16/15 11:19 PM  Result Value Ref Range   Specimen Description BLOOD RIGHT WRIST    Special Requests BOTTLES DRAWN AEROBIC AND ANAEROBIC 6CCAERO,6CCANA    Culture NO GROWTH < 12 HOURS    Report Status PENDING   Culture, blood (routine x 2)     Status: None (Preliminary result)   Collection Time: 09/16/15 11:19 PM  Result Value Ref Range   Specimen Description BLOOD LEFT ASSIST CONTROL    Special Requests      BOTTLES DRAWN AEROBIC AND ANAEROBIC 8CCAERO,10CCANA   Culture NO GROWTH < 12 HOURS    Report Status PENDING   Basic metabolic panel     Status: Abnormal   Collection Time: 09/17/15  3:31 AM  Result Value Ref Range   Sodium 137 135 - 145 mmol/L   Potassium 3.8 3.5 - 5.1 mmol/L   Chloride 111 101 - 111 mmol/L   CO2 23 22 - 32 mmol/L   Glucose, Bld 105 (H) 65 - 99 mg/dL   BUN 8 6 - 20 mg/dL   Creatinine, Ser 0.71 0.44 - 1.00 mg/dL   Calcium 8.4  (L) 8.9 - 10.3 mg/dL   GFR calc non Af Amer >60 >60 mL/min   GFR calc Af Amer >60 >60 mL/min    Comment: (NOTE) The eGFR has been calculated using the CKD EPI equation. This calculation has not been validated in all clinical situations. eGFR's persistently <60 mL/min signify possible Chronic Kidney Disease.    Anion gap 3 (L) 5 - 15  CBC     Status: Abnormal   Collection Time: 09/17/15  3:31 AM  Result Value Ref Range   WBC 9.1 3.6 - 11.0 K/uL   RBC 3.94 3.80 - 5.20 MIL/uL   Hemoglobin 11.9 (L) 12.0 - 16.0 g/dL   HCT 35.4 35.0 - 47.0 %   MCV 89.7 80.0 - 100.0 fL   MCH 30.1 26.0 - 34.0 pg   MCHC 33.6 32.0 - 36.0 g/dL   RDW 13.7 11.5 - 14.5 %   Platelets 167 150 - 440 K/uL    Current Facility-Administered Medications  Medication Dose Route Frequency Provider Last Rate Last Dose  . 0.9 %  sodium chloride infusion   Intravenous Continuous Lytle Butte, MD 100 mL/hr at 09/18/15 0252    . acetaminophen (TYLENOL) tablet 650 mg  650 mg Oral Q6H PRN Lance Coon, MD   650 mg at 09/17/15 1336   Or  . acetaminophen (TYLENOL) suppository 650 mg  650 mg Rectal Q6H PRN Lance Coon, MD      . chlordiazePOXIDE (LIBRIUM) capsule 10 mg  10 mg Oral TID Rainey Pines, MD      . clindamycin (CLEOCIN) capsule 300 mg  300 mg Oral 4 times per day Lytle Butte, MD      . enoxaparin (LOVENOX) injection 40 mg  40 mg Subcutaneous Q24H Lance Coon, MD  40 mg at 09/17/15 2249  . folic acid (FOLVITE) tablet 1 mg  1 mg Oral Daily Lance Coon, MD   1 mg at 09/18/15 1610  . haloperidol lactate (HALDOL) injection 5 mg  5 mg Intravenous Once Lytle Butte, MD      . LORazepam (ATIVAN) tablet 1 mg  1 mg Oral Q6H PRN Lance Coon, MD       Or  . LORazepam (ATIVAN) injection 1 mg  1 mg Intravenous Q6H PRN Lance Coon, MD   1 mg at 09/18/15 0720  . LORazepam (ATIVAN) injection 2 mg  2 mg Intravenous Once Lytle Butte, MD      . LORazepam (ATIVAN) tablet 0-4 mg  0-4 mg Oral Q6H Lance Coon, MD   2 mg at  09/18/15 0615   Followed by  . [START ON 09/20/2015] LORazepam (ATIVAN) tablet 0-4 mg  0-4 mg Oral Q12H Lance Coon, MD      . morphine 2 MG/ML injection 2 mg  2 mg Intravenous Q4H PRN Lytle Butte, MD   2 mg at 09/17/15 2037  . multivitamin with minerals tablet 1 tablet  1 tablet Oral Daily Lance Coon, MD   1 tablet at 09/18/15 0810  . pregabalin (LYRICA) capsule 150 mg  150 mg Oral BID Lance Coon, MD   150 mg at 09/18/15 0810  . promethazine (PHENERGAN) injection 12.5-25 mg  12.5-25 mg Intravenous Q6H PRN Lance Coon, MD   25 mg at 09/17/15 0037  . QUEtiapine (SEROQUEL) tablet 25 mg  25 mg Oral QHS Rainey Pines, MD      . sertraline (ZOLOFT) tablet 25 mg  25 mg Oral Daily Rainey Pines, MD      . sodium chloride flush (NS) 0.9 % injection 3 mL  3 mL Intravenous Q12H Lance Coon, MD   3 mL at 09/17/15 2250  . thiamine (VITAMIN B-1) tablet 100 mg  100 mg Oral Daily Lance Coon, MD   100 mg at 09/18/15 9604   Or  . thiamine (B-1) injection 100 mg  100 mg Intravenous Daily Lance Coon, MD        Musculoskeletal: Strength & Muscle Tone: within normal limits Gait & Station: normal Patient leans: N/A  Psychiatric Specialty Exam: Review of Systems  Psychiatric/Behavioral: Positive for depression and substance abuse. Negative for hallucinations. The patient is nervous/anxious.     Blood pressure 131/75, pulse 99, temperature 99 F (37.2 C), temperature source Oral, resp. rate 16, height 5' 2"  (1.575 m), weight 164 lb 3.2 oz (74.481 kg), last menstrual period 09/16/2015, SpO2 99 %.Body mass index is 30.03 kg/(m^2).  General Appearance: Casual  Eye Contact::  Fair  Speech:  Clear and Coherent and Normal Rate  Volume:  Normal  Mood:  Anxious and Depressed  Affect:  Congruent and Depressed  Thought Process:  Coherent and Goal Directed  Orientation:  Full (Time, Place, and Person)  Thought Content:  WDL  Suicidal Thoughts:  No  Homicidal Thoughts:  No  Memory:  Immediate;   Fair   Judgement:  Fair  Insight:  Fair  Psychomotor Activity:  Normal  Concentration:  Fair  Recall:  [employer] of Knowledge:Fair  Language: Fair  Akathisia:  No  Handed:  Right  AIMS (if indicated):     Assets:  Communication Skills Physical Health Social Support Talents/Skills  ADL's:  Intact  Cognition: WNL  Sleep:      Treatment Plan Summary: Daily contact with patient to assess and  evaluate symptoms and progress in treatment and Medication management  Disposition: No evidence of imminent risk to self or others at present.   Patient does not meet criteria for psychiatric inpatient admission. Supportive therapy provided about ongoing stressors. Discussed crisis plan, support from social network, calling 911, coming to the Emergency Department, and calling Suicide Hotline.   Discuss her care with the nursing staff in detail. I will start her on Librium 10 mg by mouth 3 times a day to help with the withdrawal from the Xanax at this time. I will also start her on Zoloft 25 mg daily for her depressive symptoms.   I will decrease Seroquel 25 mg by mouth daily at bedtime She can follow up with [facility] for outpatient care Thank you for allowing me to participate  in the care of this patient Patient does not meet the criteria for psychiatric involuntary commitment at this time      This note was generated in part or whole with voice recognition software. Voice regonition is usually quite accurate but there are transcription errors that can and very often do occur. I apologize for any typographical errors that were not detected and corrected.   Rainey Pines, MD 09/18/2015 2:54 PM

## 2015-09-18 NOTE — Progress Notes (Signed)
Called the nursing is patient wanted to leave AMA. She is being argumentative with staff here and refusing to take her medicines. Chart review it seems there is some similar issue earlier today. At that time it seems patient may have voiced some concerning statements in terms of self-harm. Psychiatry consult was performed and patient was not deemed appropriate for IVC. Not felt to be intermittent self threat at that time. Tonight she is stating that she wants to leave AMA. It is been explained to her that this is not medically recommended given that she has not completed treatment, and living at this time a night is not possible to coordinate continued care from the hospital setting. There is been some concern here with this patient for alcohol withdrawal as well. This was all explained to the patient she states full understanding and wishes to leave anyway.  Kristeen MissWILLIS, Tiandra [place] FIELDING [facility] 09/19/2015, 2:50 AM

## 2015-09-18 NOTE — Progress Notes (Addendum)
Nurse spoke to supervising nurse at length regarding phone conversations with both doctor's and whether patient will be placed on IVC.  Patient is on suicide precautions and supervising nurse will get patient a 1:1.

## 2015-09-18 NOTE — Progress Notes (Signed)
Patient resting on floor refusing to take medication, or nursing to obtain an IV site. Patient states that it is her right to take her medications or not. Patient is alert and oriented x4 no disorientation noted. Patient states that she does not have to be here and when she spoke to staff earlier it was all an joke.

## 2015-09-18 NOTE — Progress Notes (Signed)
Patient becoming more and more disoriented this evening, also very agitated. Medication orders were adjusted earlier today. Dr Anne HahnWillis, MD on-call notified. Patient has in Hx Alcohol use but not how much and patient unable to state about her alcohol use. CIWA orders placed. MD also notified that patient has smoke vape in room, and this has been taken from the patient, placed in patient's med bin. Nursing continues to monitor.

## 2015-09-18 NOTE — Progress Notes (Signed)
Second attempt to get patient to take medication. Nikki RN also assisted for patient to take medication. Nursing supervisor called states she would come see patient.

## 2015-09-19 ENCOUNTER — Emergency Department (HOSPITAL_COMMUNITY): Payer: Medicare Other

## 2015-09-19 ENCOUNTER — Encounter (HOSPITAL_COMMUNITY): Payer: Self-pay | Admitting: Emergency Medicine

## 2015-09-19 ENCOUNTER — Emergency Department (HOSPITAL_COMMUNITY)
Admission: EM | Admit: 2015-09-19 | Discharge: 2015-09-19 | Disposition: A | Discharge status: Home / Self Care | Payer: Medicare Other | Attending: Physician Assistant | Admitting: Physician Assistant

## 2015-09-19 DIAGNOSIS — F419 Anxiety disorder, unspecified: Secondary | ICD-10-CM | POA: Insufficient documentation

## 2015-09-19 DIAGNOSIS — F4310 Post-traumatic stress disorder, unspecified: Secondary | ICD-10-CM | POA: Insufficient documentation

## 2015-09-19 DIAGNOSIS — Z880 Allergy status to penicillin: Secondary | ICD-10-CM | POA: Diagnosis not present

## 2015-09-19 DIAGNOSIS — F319 Bipolar disorder, unspecified: Secondary | ICD-10-CM | POA: Insufficient documentation

## 2015-09-19 DIAGNOSIS — F17210 Nicotine dependence, cigarettes, uncomplicated: Secondary | ICD-10-CM | POA: Diagnosis not present

## 2015-09-19 DIAGNOSIS — Z79899 Other long term (current) drug therapy: Secondary | ICD-10-CM | POA: Diagnosis not present

## 2015-09-19 DIAGNOSIS — Z87442 Personal history of urinary calculi: Secondary | ICD-10-CM | POA: Diagnosis not present

## 2015-09-19 DIAGNOSIS — M797 Fibromyalgia: Secondary | ICD-10-CM | POA: Insufficient documentation

## 2015-09-19 DIAGNOSIS — Z3202 Encounter for pregnancy test, result negative: Secondary | ICD-10-CM | POA: Diagnosis not present

## 2015-09-19 DIAGNOSIS — Z862 Personal history of diseases of the blood and blood-forming organs and certain disorders involving the immune mechanism: Secondary | ICD-10-CM | POA: Insufficient documentation

## 2015-09-19 DIAGNOSIS — Z8719 Personal history of other diseases of the digestive system: Secondary | ICD-10-CM | POA: Diagnosis not present

## 2015-09-19 DIAGNOSIS — Z8742 Personal history of other diseases of the female genital tract: Secondary | ICD-10-CM | POA: Insufficient documentation

## 2015-09-19 DIAGNOSIS — R220 Localized swelling, mass and lump, head: Secondary | ICD-10-CM | POA: Diagnosis not present

## 2015-09-19 DIAGNOSIS — L03211 Cellulitis of face: Secondary | ICD-10-CM | POA: Diagnosis not present

## 2015-09-19 LAB — COMPREHENSIVE METABOLIC PANEL
ALBUMIN: 3.1 g/dL — AB (ref 3.5–5.0)
ALT: 141 U/L — ABNORMAL HIGH (ref 14–54)
AST: 89 U/L — AB (ref 15–41)
Alkaline Phosphatase: 87 U/L (ref 38–126)
Anion gap: 10 (ref 5–15)
BUN: 6 mg/dL (ref 6–20)
CHLORIDE: 108 mmol/L (ref 101–111)
CO2: 21 mmol/L — ABNORMAL LOW (ref 22–32)
Calcium: 9.3 mg/dL (ref 8.9–10.3)
Creatinine, Ser: 0.6 mg/dL (ref 0.44–1.00)
GFR calc Af Amer: >60 mL/min (ref >60)
GFR calc non Af Amer: >60 mL/min (ref >60)
GLUCOSE: 86 mg/dL (ref 65–99)
POTASSIUM: 3.7 mmol/L (ref 3.5–5.1)
SODIUM: 139 mmol/L (ref 135–145)
Total Bilirubin: 0.2 mg/dL — ABNORMAL LOW (ref 0.3–1.2)
Total Protein: 6.3 g/dL — ABNORMAL LOW (ref 6.5–8.1)

## 2015-09-19 LAB — CBC WITH DIFFERENTIAL/PLATELET
Basophils Absolute: 0 10*3/uL (ref 0.0–0.1)
Basophils Relative: 0 %
EOS PCT: 2 %
Eosinophils Absolute: 0.2 10*3/uL (ref 0.0–0.7)
HEMATOCRIT: 37.2 % (ref 36.0–46.0)
Hemoglobin: 12.3 g/dL (ref 12.0–15.0)
LYMPHS ABS: 2.7 10*3/uL (ref 0.7–4.0)
LYMPHS PCT: 37 %
MCH: 29.5 pg (ref 26.0–34.0)
MCHC: 33.1 g/dL (ref 30.0–36.0)
MCV: 89.2 fL (ref 78.0–100.0)
MONO ABS: 0.5 10*3/uL (ref 0.1–1.0)
Monocytes Relative: 7 %
Neutro Abs: 3.9 10*3/uL (ref 1.7–7.7)
Neutrophils Relative %: 54 %
PLATELETS: 186 10*3/uL (ref 150–400)
RBC: 4.17 MIL/uL (ref 3.87–5.11)
RDW: 13.4 % (ref 11.5–15.5)
WBC: 7.3 10*3/uL (ref 4.0–10.5)

## 2015-09-19 LAB — HCG, SERUM, QUALITATIVE: Preg, Serum: NEGATIVE

## 2015-09-19 MED ORDER — CLINDAMYCIN HCL 150 MG PO CAPS
300.0000 mg | ORAL_CAPSULE | Freq: Once | ORAL | Status: AC
  Administered 2015-09-19: 300 mg via ORAL
  Filled 2015-09-19: qty 2

## 2015-09-19 MED ORDER — CLINDAMYCIN HCL 300 MG PO CAPS: 300.0000 mg | ORAL_CAPSULE | Freq: Three times a day (TID) | ORAL | Status: DC

## 2015-09-19 NOTE — Discharge Instructions (Signed)
Follow up with your PCP for follow up.  Please return if symptoms get worse, you have pain with eye movement or other concerns.   Cellulitis Cellulitis is an infection of the skin and the tissue beneath it. The infected area is usually red and tender. Cellulitis occurs most often in the arms and lower legs.  CAUSES  Cellulitis is caused by bacteria that enter the skin through cracks or cuts in the skin. The most common types of bacteria that cause cellulitis are staphylococci and streptococci. SIGNS AND SYMPTOMS   Redness and warmth.  Swelling.  Tenderness or pain.  Fever. DIAGNOSIS  Your health care provider can usually determine what is wrong based on a physical exam. Blood tests may also be done. TREATMENT  Treatment usually involves taking an antibiotic medicine. HOME CARE INSTRUCTIONS   Take your antibiotic medicine as directed by your health care provider. Finish the antibiotic even if you start to feel better.  Keep the infected arm or leg elevated to reduce swelling.  Apply a warm cloth to the affected area up to 4 times per day to relieve pain.  Take medicines only as directed by your health care provider.  Keep all follow-up visits as directed by your health care provider. SEEK MEDICAL CARE IF:   You notice red streaks coming from the infected area.  Your red area gets larger or turns dark in color.  Your bone or joint underneath the infected area becomes painful after the skin has healed.  Your infection returns in the same area or another area.  You notice a swollen bump in the infected area.  You develop new symptoms.  You have a fever. SEEK IMMEDIATE MEDICAL CARE IF:   You feel very sleepy.  You develop vomiting or diarrhea.  You have a general ill feeling (malaise) with muscle aches and pains.   This information is not intended to replace advice given to you by your health care provider. Make sure you discuss any questions you have with your health  care provider.   Document Released: 03/21/2005 Document Revised: 03/02/2015 Document Reviewed: 08/27/2011 Elsevier Interactive Patient Education 2016 [employer].  MRSA FAQs What is MRSA? Staphylococcus aureus (pronounced staff-ill-oh-KOK-us AW-ree-us), or "Staph" is a very common germ that about 1 out of every 3 people have on their skin or in their nose. This germ does not cause any problems for most people who have it on their skin. But sometimes it can cause serious infections such as skin or wound infections, pneumonia, or infections of the blood.  Antibiotics are given to kill Staph germs when they cause infections. Some Staph are resistant, meaning they cannot be killed by some antibiotics. "Methicillin-resistant Staphylococcus aureus" or "MRSA" is a type of Staph that is resistant to some of the antibiotics that are often used to treat Staph infections. Who is most likely to get an MRSA infection? In the hospital, people who are more likely to get an MRSA infection are people who:  have other health conditions making them sick  have been in the hospital or a nursing home  have been treated with antibiotics. People who are healthy and who have not been in the hospital or a nursing home can also get MRSA infections. These infections usually involve the skin. More information about this type of MRSA infection, known as "community-associated MRSA" infection, is available from the Centers for Disease Control and Prevention (CDC). ReportNation.uy How do I get an MRSA infection? People who have MRSA germs  on their skin or who are infected with MRSA may be able to spread the germ to other people. MRSA can be passed on to bed linens, bed rails, bathroom fixtures, and medical equipment. It can spread to other people on contaminated equipment and on the hands of doctors, nurses, other healthcare providers and visitors. Can MRSA infections be treated? Yes, there are antibiotics that can  kill MRSA germs. Some patients with MRSA abscesses may need surgery to drain the infection. Your healthcare provider will determine which treatments are best for you. What are some of the things that hospitals are doing to prevent MRSA infections? To prevent MRSA infections, doctors, nurses and other healthcare providers:  Clean their hands with soap and water or an alcohol-based hand rub before and after caring for every patient.  Carefully clean hospital rooms and medical equipment.  Use Contact Precautions when caring for patients with MRSA. Contact Precautions mean:  Whenever possible, patients with MRSA will have a single room or will share a room only with someone else who also has MRSA.  Healthcare providers will put on gloves and wear a gown over their clothing while taking care of patients with MRSA.  Visitors may also be asked to wear a gown and gloves.  When leaving the room, hospital providers and visitors remove their gown and gloves and clean their hands.  Patients on Contact Precautions are asked to stay in their hospital rooms as much as possible. They should not go to common areas, such as the gift shop or cafeteria. They may go to other areas of the hospital for treatments and tests.  May test some patients to see if they have MRSA on their skin. This test involves rubbing a cotton-tipped swab in the patient's nostrils or on the skin. What can I do to help prevent MRSA infections? In the hospital  Make sure that all doctors, nurses, and other healthcare providers clean their hands with soap and water or an alcohol-based hand rub before and after caring for you. If you do not see your providers clean their hands, please ask them to do so. When you go home  If you have wounds or an intravascular device (such as a catheter or dialysis port) make sure that you know how to take care of them. Can my friends and family get MRSA when they visit me? The chance of getting MRSA  while visiting a person who has MRSA is very low. To decrease the chance of getting MRSA your family and friends should:  Clean their hands before they enter your room and when they leave.  Ask a healthcare provider if they need to wear protective gowns and gloves when they visit you. What do I need to do when I go home from the hospital? To prevent another MRSA infection and to prevent spreading MRSA to others:  Keep taking any antibiotics prescribed by your doctor. Don't take half-doses or stop before you complete your prescribed course.  Clean your hands often, especially before and after changing your wound dressing or bandage.  People who live with you should clean their hands often as well.  Keep any wounds clean and change bandages as instructed until healed.  Avoid sharing personal items such as towels or razors.  Wash and dry your clothes and bed linens in the warmest temperatures recommended on the labels.  Tell your healthcare providers that you have MRSA. This includes home health nurses and aides, therapists, and personnel in doctors' offices.  Your  doctor may have more instructions for you. If you have questions, please ask your doctor or nurse. Developed and co-sponsored by [employer] for [employer] of [place] [phone]); Infectious Diseases Society of America (IDSA); [facility]; Association for Professionals in Infection Control and Epidemiology (APIC); Centers for Disease Control and Prevention (CDC); and [employer]   This information is not intended to replace advice given to you by your health care provider. Make sure you discuss any questions you have with your health care provider.   Document Released: 06/16/2013 Document Reviewed: 08/25/2014 Elsevier Interactive Patient Education [employer]

## 2015-09-19 NOTE — ED Notes (Signed)
C/o skin infection to forehead since Friday.

## 2015-09-19 NOTE — ED Provider Notes (Signed)
CSN: [id number]     Arrival date & time 09/19/15  0122 History   First MD Initiated Contact with Patient 09/19/15 [phone]     Chief Complaint  Patient presents with  . Recurrent Skin Infections     (Consider location/radiation/quality/duration/timing/severity/associated sxs/prior Treatment) HPI   Patient is a 40 year old female with past medical history significant for bipolar, anxiety, fibromyalgia, PTSD presenting today with likely facial cellulitis. Patient was seen at [facility] for this on Friday. She got a CT and MRI completed that time. When asked about this patient denies any any imaging done. She does report that they found something that needs to be treated with IV antibiotics but she "didn't like the way they were treating her there". So she left AMA.  In reviewing the records MRI appears to have evidence of cellulitis of the forehead. CAT scan done at that time, 3 days ago shows evidence of cellulitis, question parotid infection.   Past Medical History  Diagnosis Date  . Bipolar affective (HCC)     pt reported  . Uterine polyp   . Eating disorder     Under control per patient  . Renal disorder   . Painful menstrual periods   . Abnormal vaginal Pap smear     10+ years ago- no colpo repeat was normal  . Painful intercourse   . GERD (gastroesophageal reflux disease)   . VWD (acquired von Willebrand's disease) (HCC)   . Anxiety   . Spinal stenosis   . Kidney stone   . Bipolar disorder (HCC)   . AR (allergic rhinitis)   . Pelvic pain in female   . Fibromyalgia   . PTSD (post-traumatic stress disorder)    Past Surgical History  Procedure Laterality Date  . Tubal ligation    . Laparoscopy abdomen diagnostic    . Hysteroscopy      removed polyps  . [facility]  [facility]  . Abdominal hysterectomy    . Laparoscopy Left 01/10/2015    Procedure: LAPAROSCOPY OPERATIVE with biopsy, left oopherectomy;  Surgeon: Herold HarmsMartin A Defrancesco,  MD;  Location: [facility] ORS;  Service: Gynecology;  Laterality: Left;   Family History  Problem Relation Age of Onset  . Breast cancer Paternal Grandmother   . Diabetes Maternal Grandmother   . Hypertension Father   . Sleep apnea Mother   . Diabetes Maternal Aunt   . Diabetes Maternal Uncle    Social History  Substance Use Topics  . Smoking status: Current Every Day Smoker -- 0.75 packs/day    Types: Cigarettes    Start date: 05/18/1995  . Smokeless tobacco: Never Used  . Alcohol Use: 0.0 oz/week    0 Standard drinks or equivalent per week     Comment: Socially   OB History    Gravida Para Term Preterm AB TAB SAB Ectopic Multiple Living   1 1 1       1      Review of Systems  Constitutional: Negative for activity change and fatigue.  HENT: Positive for facial swelling. Negative for congestion and drooling.   Genitourinary: Negative for dysuria.  Psychiatric/Behavioral: Positive for decreased concentration.      Allergies  Wellbutrin; Augmentin; Hydrocodone-acetaminophen; Penicillins; Vicoprofen ; Lasix; and Nitrofurantoin monohyd macro  Home Medications   Prior to Admission medications   Medication Sig Start Date End Date Taking? Authorizing Provider  ALPRAZolam (XANAX XR) 1 MG 24 hr tablet Take 1 mg by mouth 2 (two) times  daily.    Historical Provider, MD  ALPRAZolam Prudy Feeler) 0.5 MG tablet Take 1 tablet (0.5 mg total) by mouth 3 (three) times daily as needed for anxiety. 09/08/15   Ellyn Hack, MD  Biotin w/ Vitamins C & E (HAIR SKIN & NAILS GUMMIES) 1250-7.5-7.5 MCG-MG-UNT CHEW Chew 1 each by mouth daily.    Historical Provider, MD  ibuprofen (ADVIL,MOTRIN) 800 MG tablet Take 800 mg by mouth 3 (three) times daily as needed for mild pain.    Historical Provider, MD  LYRICA 150 MG capsule Take 1 capsule (150 mg total) by mouth 2 (two) times daily. 09/08/15   Ellyn Hack, MD  Melatonin 10 MG SUBL Place 10 mg under the tongue at bedtime as needed (for sleep).      Historical Provider, MD  ondansetron (ZOFRAN) 4 MG tablet Take 1 tablet (4 mg total) by mouth every 8 (eight) hours as needed for nausea or vomiting. Patient not taking: Reported on 09/17/2015 07/24/15   Governor Rooks, MD  oxyCODONE-acetaminophen (PERCOCET) 7.5-325 MG tablet Take 1 tablet by mouth every 8 (eight) hours as needed for severe pain. Patient taking differently: Take 2 tablets by mouth every 6 (six) hours as needed for severe pain.  09/08/15   Ellyn Hack, MD  promethazine (PHENERGAN) 25 MG tablet Take 1 tablet (25 mg total) by mouth daily as needed for nausea or vomiting. Patient not taking: Reported on 09/17/2015 09/08/15   Ellyn Hack, MD  QUEtiapine (SEROQUEL) 100 MG tablet Take 100 mg by mouth at bedtime.    Historical Provider, MD   BP 127/73 mmHg  Pulse 97  Temp(Src) 98.4 F (36.9 C) (Oral)  Resp 18  Ht  (1.575 m)  Wt 165 lb (74.844 kg)  BMI 30.17 kg/m2  SpO2 98%  LMP 09/16/2015 Physical Exam  Constitutional: She is oriented to person, place, and time. She appears well-developed and well-nourished.  HENT:  Head: Normocephalic and atraumatic.  Swelling around right eye, very mild. No erythema. EOMI no pain,  At most-preseptal cellulitis.  Mild erythema to 2 by 2 cm over left frontal sinus.   Nonspecific swelling with no overlying erythema to the left jaw.  Eyes: Conjunctivae are normal. Right eye exhibits no discharge.  Neck: Neck supple.  Cardiovascular: Normal rate, regular rhythm and normal heart sounds.   No murmur heard. Pulmonary/Chest: Effort normal and breath sounds normal. She has no wheezes. She has no rales.  Abdominal: Soft. She exhibits no distension. There is no tenderness.  Musculoskeletal: Normal range of motion. She exhibits no edema.  Neurological: She is oriented to person, place, and time. No cranial nerve deficit.  Patient sleepy.  Skin: Skin is warm and dry. No rash noted. She is not diaphoretic.  Nursing note and vitals  reviewed.   ED Course  Procedures (including critical care time) Labs Review Labs Reviewed  COMPREHENSIVE METABOLIC PANEL - Abnormal; Notable for the following:    CO2 21 (*)    Total Protein 6.3 (*)    Albumin 3.1 (*)    AST 89 (*)    ALT 141 (*)    Total Bilirubin 0.2 (*)    All other components within normal limits  CBC WITH DIFFERENTIAL/PLATELET  HCG, SERUM, QUALITATIVE  URINE RAPID DRUG SCREEN, HOSP PERFORMED  PREGNANCY, URINE    Imaging Review Ct Maxillofacial Wo Cm  09/19/2015  CLINICAL DATA:  Forehead cellulitis, RIGHT eye swelling. Prior imaging demonstrating set ileitis and possible parotid  infection. EXAM: CT MAXILLOFACIAL WITHOUT CONTRAST TECHNIQUE: Multidetector CT imaging of the maxillofacial structures was performed. Multiplanar CT image reconstructions were also generated. A small metallic BB was placed on the right temple in order to reliably differentiate right from left. COMPARISON:  MRI of the brain and CT head September 16, 2015 FINDINGS: The mandible is intact, the condyles are located. No acute facial fracture. Paranasal sinuses are well aerated. Nasal septum is midline. No destructive bony lesions. Ocular globes and orbital contents are unremarkable. Mild LEFT frontal scalp soft tissue swelling, decreased from prior examination without subcutaneous gas or radiopaque foreign bodies. IMPRESSION: Mild LEFT frontal scalp soft tissue swelling compatible with cellulitis, improved from September 16, 2015. Otherwise negative noncontrast maxillofacial CT. Electronically Signed   By: Awilda Metro M.D.   On: 09/19/2015 06:31   I have personally reviewed and evaluated these images and lab results as part of my medical decision-making.   EKG Interpretation None      MDM   Final diagnoses:  None    Patient is a 40 year old female presenting with multiple areas of what appears to be healing cellulitis. Patient initially told provider that [facility] wanted her stay  for IV antibiotics but she didn't like the way she is being treated so she left. Patient thinks that her swelling has gotten worse since Friday. We'll get a CT scan to make sure there is no worsening. in reviewing her old scans I think by mouth antibiotics would be reasonable first choice. Patient has no pain with extraocular movements, mild swelling and subtle signs of infection. She is taking by mouth. She has no fever she has normal vital signs.   7:16 AM CT shows mild infection, no drainable infection.  Will treat with PO antibiotics.  Return precautions expressed and patient expressed understanding.   Daegen Berrocal Randall An, MD 09/19/15 [phone]

## 2015-09-19 NOTE — ED Notes (Signed)
Pt verbalized understanding of d/c instructions, prescriptions, and follow-up care. No further questions/concerns, VSS, ambulatory w/ steady gait (refused wheelchair) 

## 2015-09-20 ENCOUNTER — Encounter: Payer: Self-pay | Admitting: Emergency Medicine

## 2015-09-20 ENCOUNTER — Emergency Department
Admission: EM | Admit: 2015-09-20 | Discharge: 2015-09-20 | Disposition: A | Discharge status: Home / Self Care | Payer: Medicare Other | Attending: Emergency Medicine | Admitting: Emergency Medicine

## 2015-09-20 ENCOUNTER — Emergency Department: Payer: Medicare Other

## 2015-09-20 DIAGNOSIS — R51 Headache: Secondary | ICD-10-CM | POA: Diagnosis not present

## 2015-09-20 DIAGNOSIS — F17210 Nicotine dependence, cigarettes, uncomplicated: Secondary | ICD-10-CM | POA: Diagnosis not present

## 2015-09-20 DIAGNOSIS — F419 Anxiety disorder, unspecified: Secondary | ICD-10-CM | POA: Insufficient documentation

## 2015-09-20 DIAGNOSIS — Z880 Allergy status to penicillin: Secondary | ICD-10-CM | POA: Insufficient documentation

## 2015-09-20 DIAGNOSIS — F329 Major depressive disorder, single episode, unspecified: Secondary | ICD-10-CM | POA: Insufficient documentation

## 2015-09-20 DIAGNOSIS — Z792 Long term (current) use of antibiotics: Secondary | ICD-10-CM | POA: Diagnosis not present

## 2015-09-20 DIAGNOSIS — Z79899 Other long term (current) drug therapy: Secondary | ICD-10-CM | POA: Diagnosis not present

## 2015-09-20 DIAGNOSIS — R0602 Shortness of breath: Secondary | ICD-10-CM | POA: Insufficient documentation

## 2015-09-20 DIAGNOSIS — S0081XS Abrasion of other part of head, sequela: Secondary | ICD-10-CM | POA: Diagnosis not present

## 2015-09-20 DIAGNOSIS — X58XXXS Exposure to other specified factors, sequela: Secondary | ICD-10-CM | POA: Diagnosis not present

## 2015-09-20 DIAGNOSIS — R220 Localized swelling, mass and lump, head: Secondary | ICD-10-CM | POA: Diagnosis present

## 2015-09-20 DIAGNOSIS — L03211 Cellulitis of face: Secondary | ICD-10-CM

## 2015-09-20 LAB — CBC
HEMATOCRIT: 40 % (ref 35.0–47.0)
HEMOGLOBIN: 13.5 g/dL (ref 12.0–16.0)
MCH: 29.7 pg (ref 26.0–34.0)
MCHC: 33.7 g/dL (ref 32.0–36.0)
MCV: 88.2 fL (ref 80.0–100.0)
Platelets: 201 10*3/uL (ref 150–440)
RBC: 4.54 MIL/uL (ref 3.80–5.20)
RDW: 13.8 % (ref 11.5–14.5)
WBC: 7.3 10*3/uL (ref 3.6–11.0)

## 2015-09-20 LAB — BASIC METABOLIC PANEL
Anion gap: 8 (ref 5–15)
BUN: 13 mg/dL (ref 6–20)
CHLORIDE: 108 mmol/L (ref 101–111)
CO2: 18 mmol/L — AB (ref 22–32)
CREATININE: 0.58 mg/dL (ref 0.44–1.00)
Calcium: 9 mg/dL (ref 8.9–10.3)
GFR calc Af Amer: >60 mL/min (ref >60)
GFR calc non Af Amer: >60 mL/min (ref >60)
GLUCOSE: 95 mg/dL (ref 65–99)
Potassium: 3.3 mmol/L — ABNORMAL LOW (ref 3.5–5.1)
Sodium: 134 mmol/L — ABNORMAL LOW (ref 135–145)

## 2015-09-20 LAB — TROPONIN I: Troponin I: <0.03 ng/mL (ref <0.031)

## 2015-09-20 MED ORDER — ACETAMINOPHEN 500 MG PO TABS
1000.0000 mg | ORAL_TABLET | Freq: Once | ORAL | Status: AC
  Administered 2015-09-20: 1000 mg via ORAL
  Filled 2015-09-20: qty 2

## 2015-09-20 MED ORDER — SULFAMETHOXAZOLE-TRIMETHOPRIM 800-160 MG PO TABS: 1.0000 | ORAL_TABLET | Freq: Two times a day (BID) | ORAL | Status: DC

## 2015-09-20 MED ORDER — SULFAMETHOXAZOLE-TRIMETHOPRIM 800-160 MG PO TABS
1.0000 | ORAL_TABLET | Freq: Once | ORAL | Status: AC
  Administered 2015-09-20: 1 via ORAL
  Filled 2015-09-20: qty 1

## 2015-09-20 MED ORDER — SODIUM CHLORIDE 0.9 % IV BOLUS (SEPSIS)
1000.0000 mL | Freq: Once | INTRAVENOUS | Status: AC
  Administered 2015-09-20: 1000 mL via INTRAVENOUS

## 2015-09-20 MED ORDER — ALPRAZOLAM 0.5 MG PO TABS
1.0000 mg | ORAL_TABLET | Freq: Once | ORAL | Status: AC
  Administered 2015-09-20: 1 mg via ORAL
  Filled 2015-09-20: qty 2

## 2015-09-20 MED ORDER — POTASSIUM CHLORIDE CRYS ER 20 MEQ PO TBCR
40.0000 meq | EXTENDED_RELEASE_TABLET | Freq: Once | ORAL | Status: AC
  Administered 2015-09-20: 40 meq via ORAL
  Filled 2015-09-20: qty 2

## 2015-09-20 MED ORDER — ALPRAZOLAM 1 MG PO TABS: 0.5000 mg | ORAL_TABLET | Freq: Three times a day (TID) | ORAL | Status: DC | PRN

## 2015-09-20 NOTE — ED Provider Notes (Signed)
[facility] Emergency Department Provider Note  ____________________________________________  Time seen: Approximately 9:31 AM  I have reviewed the triage vital signs and the nursing notes.   HISTORY  Chief Complaint Shortness of Breath and Facial Swelling    HPI Casey Hodges is a 40 y.o. female with bipolar disorder, anxiety, fibromyalgia presenting with facial rash, and shortness of breath.The patient left an inpatient admission AGAINST MEDICAL ADVICE this weekend while being treated for a facial cellulitis. She did not go home with any oral antibiotics. Since her discharge, she reports that her facial swelling has significantly improved but that her redness has not changed. In addition, she has been feeling intermittently short of breath. Her shortness of breath is not related to exertion and she has not had a cough or cold symptoms, fever, chills. No chest pain or palpitations. She also reports significant anxiety and panic attacks.  She has been treated with Xanax in the past but does not have any at home.. She is concerned that she may need to be readmitted for IV antibiotics for   Past Medical History  Diagnosis Date  . Bipolar affective (HCC)     pt reported  . Uterine polyp   . Eating disorder     Under control per patient  . Renal disorder   . Painful menstrual periods   . Abnormal vaginal Pap smear     10+ years ago- no colpo repeat was normal  . Painful intercourse   . GERD (gastroesophageal reflux disease)   . VWD (acquired von Willebrand's disease) (HCC)   . Anxiety   . Spinal stenosis   . Kidney stone   . Bipolar disorder (HCC)   . AR (allergic rhinitis)   . Pelvic pain in female   . Fibromyalgia   . PTSD (post-traumatic stress disorder)     Patient Active Problem List   Diagnosis Date Noted  . GERD (gastroesophageal reflux disease) 09/16/2015  . Sepsis (HCC) 09/16/2015  . Facial cellulitis 09/16/2015  . Cervical spine pain  09/08/2015  . Smoker 07/25/2015  . Hypotension 07/25/2015  . Upper respiratory infection 07/12/2015  . Snoring 06/09/2015  . Cardiac murmur 05/25/2015  . Cloudy urine 05/25/2015  . Numbness of foot 04/20/2015  . Drug-induced nausea and vomiting 03/22/2015  . Need for immunization against influenza 03/21/2015  . Cyst, bone 02/14/2015  . Pelvic adhesive disease 01/21/2015  . Tick bite 01/20/2015  . Arthritis 12/19/2014  . Calculus of kidney 12/13/2014  . Patchy loss of hair 12/13/2014  . Anxiety disorder 12/13/2014  . Postural dizziness 12/13/2014  . Subcutaneous cyst 12/13/2014  . Nephrolithiasis 05/23/2012  . Chronic pelvic pain in female 05/23/2012  . Fibromyalgia 05/23/2012  . Bipolar disorder with depression (HCC) 06/05/2011    Past Surgical History  Procedure Laterality Date  . Tubal ligation    . Laparoscopy abdomen diagnostic    . Hysteroscopy      removed polyps  . Laparoscopic vaginal hysterectomy  2015    at [facility]  . Abdominal hysterectomy    . Laparoscopy Left 01/10/2015    Procedure: LAPAROSCOPY OPERATIVE with biopsy, left oopherectomy;  Surgeon: Herold Harms, MD;  Location: [facility] ORS;  Service: Gynecology;  Laterality: Left;    Current Outpatient Rx  Name  Route  Sig  Dispense  Refill  . Biotin w/ Vitamins C & E (HAIR SKIN & NAILS GUMMIES) 1250-7.5-7.5 MCG-MG-UNT CHEW   Oral   Chew 1 each by mouth daily.         Marland Kitchen  clindamycin (CLEOCIN) 300 MG capsule   Oral   Take 1 capsule (300 mg total) by mouth 3 (three) times daily.   30 capsule   0   . ibuprofen (ADVIL,MOTRIN) 800 MG tablet   Oral   Take 800 mg by mouth 3 (three) times daily as needed for mild pain.         Marland Kitchen LYRICA 150 MG capsule   Oral   Take 1 capsule (150 mg total) by mouth 2 (two) times daily.   60 capsule   0     Dispense as written.   . Melatonin 10 MG SUBL   Sublingual   Place 10 mg under the tongue at bedtime as needed (for sleep).          . ondansetron  (ZOFRAN) 4 MG tablet   Oral   Take 1 tablet (4 mg total) by mouth every 8 (eight) hours as needed for nausea or vomiting. Patient not taking: Reported on 09/17/2015   10 tablet   0   . oxyCODONE-acetaminophen (PERCOCET) 7.5-325 MG tablet   Oral   Take 1 tablet by mouth every 8 (eight) hours as needed for severe pain. Patient taking differently: Take 2 tablets by mouth every 6 (six) hours as needed for severe pain.    90 tablet   0   . promethazine (PHENERGAN) 25 MG tablet   Oral   Take 1 tablet (25 mg total) by mouth daily as needed for nausea or vomiting. Patient not taking: Reported on 09/17/2015   30 tablet   0   . QUEtiapine (SEROQUEL) 100 MG tablet   Oral   Take 100 mg by mouth at bedtime.         . sulfamethoxazole-trimethoprim (BACTRIM DS,SEPTRA DS) 800-160 MG tablet   Oral   Take 1 tablet by mouth 2 (two) times daily.   10 tablet   0     Allergies Wellbutrin; Augmentin; Penicillins; Vicoprofen ; Lasix; and Nitrofurantoin monohyd macro  Family History  Problem Relation Age of Onset  . Breast cancer Paternal Grandmother   . Diabetes Maternal Grandmother   . Hypertension Father   . Sleep apnea Mother   . Diabetes Maternal Aunt   . Diabetes Maternal Uncle     Social History Social History  Substance Use Topics  . Smoking status: Current Every Day Smoker -- 0.75 packs/day    Types: Cigarettes    Start date: 05/18/1995  . Smokeless tobacco: Never Used  . Alcohol Use: 0.0 oz/week    0 Standard drinks or equivalent per week     Comment: Socially    Review of Systems Constitutional: No fever/chills.No lightheadedness or syncope. Positive for diffuse aches and pains without focality. Eyes: No visual changes. ENT: No sore throat. No congestion or rhinorrhea. Cardiovascular: Denies chest pain. Denies palpitations. Respiratory: Positive shortness of breath.  No cough. Gastrointestinal: No abdominal pain.  No nausea, no vomiting.  No diarrhea.  No  constipation. Genitourinary: Negative for dysuria. Musculoskeletal: Negative for back pain. Skin: Positive for facial abrasion and rash. Neurological: Positive for mild headache. No focal numbness, tingling or weakness.  Psychiatric:Positive anxiety  10-point ROS otherwise negative.  ____________________________________________   PHYSICAL EXAM:  VITAL SIGNS: ED Triage Vitals  Enc Vitals Group     BP 09/20/15 0858 132/85 mmHg     Pulse Rate 09/20/15 0858 94     Resp 09/20/15 0858 18     Temp 09/20/15 0858 97.9 F (36.6 C)  Temp Source 09/20/15 0858 Oral     SpO2 09/20/15 0858 98 %     Weight 09/20/15 0856 160 lb (72.576 kg)     Height 09/20/15 0856 5\' 2"  (1.575 m)     Head Cir --      Peak Flow --      Pain Score 09/20/15 0856 8     Pain Loc --      Pain Edu? --      Excl. in GC? --     Constitutional: Patient is alert and oriented and answering questions appropriately. Initially on arrival she was tearful, but now is feeling better. Nontoxic.  Eyes: Conjunctivae are normal.  EOMI. No scleral icterus. Head: Atraumatic. Patient has a 0.5 x 1 cm abrasion above the left eyebrow with less than 1 cm of mild erythema and no swelling. She has another abrasion that is 0.5 cm under the left mandible without significant associated swelling. Nose: No congestion/rhinnorhea. Mouth/Throat: Mucous membranes are moist. No trismus. No obvious dental infection including abscess. Neck: No stridor.  Supple.  No meningismus Cardiovascular: Normal rate, regular rhythm. No murmurs, rubs or gallops.  Respiratory: Normal respiratory effort.  No accessory muscle use or retractions. Lungs CTAB.  No wheezes, rales or ronchi. Gastrointestinal: Soft, nontender and nondistended.  No guarding or rebound.  No peritoneal signs. Musculoskeletal: No LE edema. No ttp in the calves or palpable cords.  Negative Homan's sign. Neurologic:  A&Ox3.  Speech is clear.  Face and smile are symmetric.  EOMI.  Moves  all extremities well. Skin:  Skin is warm, dry and intact. No rash noted. Psychiatric: Patient has an anxious and flat affect with depressed mood. Speech and behavior are normal.  Normal judgement.**}  ____________________________________________   LABS (all labs ordered are listed, but only abnormal results are displayed)  Labs Reviewed  BASIC METABOLIC PANEL - Abnormal; Notable for the following:    Sodium 134 (*)    Potassium 3.3 (*)    CO2 18 (*)    All other components within normal limits  CBC  TROPONIN I   ____________________________________________  EKG    ____________________________________________  RADIOLOGY  Dg Chest 2 View  09/20/2015  CLINICAL DATA:  Shortness of Breath EXAM: CHEST  2 VIEW COMPARISON:  07/24/2015 FINDINGS: The heart size and mediastinal contours are within normal limits. Both lungs are clear. The visualized skeletal structures are unremarkable. IMPRESSION: No active cardiopulmonary disease. Electronically Signed   By: Alcide CleverMark  Lukens M.D.   On: 09/20/2015 09:55    ____________________________________________   PROCEDURES  Procedure(s) performed: None  Critical Care performed: No ____________________________________________   INITIAL IMPRESSION / ASSESSMENT AND PLAN / ED COURSE  Pertinent labs & imaging results that were available during my care of the patient were reviewed by me and considered in my medical decision making (see chart for details).  40 y.o. female with multiple chronic medical and psychiatric illnesses presenting with improved facial cellulitis and anxiety, shortness of breath. The cause of the patient's shortness of breath is likely related to her anxiety, but I will evaluate her for any cardiopulmonary abnormality. On my exam, the patient has clear breath sounds and a normal cardiac exam, and she has stable vital signs with a normal oxygenation and respiratory rate. Her facial cellulitis is significantly improved, despite  being off of antibiotics for several days. I will restart her on oral antibiotics for completed treatment, but she does not meet criteria for inpatient admission at this time.  ----------------------------------------- 10:14  AM on 09/20/2015 -----------------------------------------  The patient's labs are reassuring and she continues to be hemodynamically stable with a normal respiratory rate and oxygenation of 98%. Her chest x-ray does not show any acute cardiopulmonary disease. We will plan to discharge her home on oral antibiotics, and have her follow-up with her primary care physician for long-term management of her anxiety.  ____________________________________________  FINAL CLINICAL IMPRESSION(S) / ED DIAGNOSES  Final diagnoses:  Facial cellulitis  Facial abrasion, sequela  Shortness of breath  Anxiety      NEW MEDICATIONS STARTED DURING THIS VISIT:  Discharge Medication List as of 09/20/2015 10:18 AM    START taking these medications   Details  sulfamethoxazole-trimethoprim (BACTRIM DS,SEPTRA DS) 800-160 MG tablet Take 1 tablet by mouth 2 (two) times daily., Starting 09/20/2015, Until Discontinued, Print         Rockne Menghini, MD 09/20/15 1550

## 2015-09-20 NOTE — ED Notes (Signed)
Pt was admitted here last Friday for cellulitis on her face/forehead area. Left AMA because her daughter became sick. Now back with c/o shob, facial pain, and "feeling really sick." Facial swelling noted, pt breathing normally, sats WNL.

## 2015-09-20 NOTE — Discharge Summary (Addendum)
[facility] [place] [facility]   PATIENT NAME: Casey CrookKena Hodges    MR#:  [id number]  DATE OF BIRTH:  12/17/1975  DATE OF ADMISSION:  09/16/2015 ADMITTING PHYSICIAN: Oralia Manisavid Willis, MD  DATE OF DISCHARGE: 09/18/2015 11:46 PM  PRIMARY CARE PHYSICIAN: Brayton ElSyed Shah, MD    ADMISSION DIAGNOSIS:  Facial cellulitis [L03.211]  DISCHARGE DIAGNOSIS:  Principal Problem:   Sepsis (HCC) Active Problems:   Bipolar disorder with depression (HCC)   Anxiety disorder   GERD (gastroesophageal reflux disease)   Facial cellulitis   SECONDARY DIAGNOSIS:   Past Medical History  Diagnosis Date  . Bipolar affective (HCC)     pt reported  . Uterine polyp   . Eating disorder     Under control per patient  . Renal disorder   . Painful menstrual periods   . Abnormal vaginal Pap smear     10+ years ago- no colpo repeat was normal  . Painful intercourse   . GERD (gastroesophageal reflux disease)   . VWD (acquired von Willebrand's disease) (HCC)   . Anxiety   . Spinal stenosis   . Kidney stone   . Bipolar disorder (HCC)   . AR (allergic rhinitis)   . Pelvic pain in female   . Fibromyalgia   . PTSD (post-traumatic stress disorder)     HOSPITAL COURSE:  Casey CrookKena Denault  is a 40 y.o. female admitted 09/16/2015 with chief complaint Of facial swelling please see H&P performed by Dr. Anne HahnWillis for further information. Upon presentation she didn't meet criteria for sepsis with a likely etiology being facial cellulitis. She was originally started on broad-spectrum antibiotics, culture data was not finalized Willis had no growth. She became increasingly agitated during her stay in the hospital after nursing staff and cleaning staff found hidden bottles of Xanax and other medications. She became extremely agitated and combative when told she was not able to take these medications. This happened on multiple instances. After the first time she requested to leave AGAINST MEDICAL ADVICE however she  was hallucinating at that time, psychiatry became involved in her case she had no indication for involuntary commitment at that time. Her hallucinations and altered mental status resolved once herself prescribed medications wore off. She again became combative however this time not under the influence of substances and left AGAINST MEDICAL ADVICE On the day of her leaving AGAINST MEDICAL ADVICE she was already being transitioned to oral antibiotics given no culture growth.

## 2015-09-20 NOTE — Discharge Instructions (Signed)
Please take the entire course of antibiotics, even if you're feeling better and your rash has completely resolved.  Please make an appointment with your primary care physician for reevaluation of your rash, and to discuss long-term management of your anxiety.  Please return to the emergency department if you develop chest pain, shortness of breath, fever, lightheadedness or fainting, or any other symptoms concerning to you.  Cellulitis Cellulitis is an infection of the skin and the tissue beneath it. The infected area is usually red and tender. Cellulitis occurs most often in the arms and lower legs.  CAUSES  Cellulitis is caused by bacteria that enter the skin through cracks or cuts in the skin. The most common types of bacteria that cause cellulitis are staphylococci and streptococci. SIGNS AND SYMPTOMS   Redness and warmth.  Swelling.  Tenderness or pain.  Fever. DIAGNOSIS  Your health care provider can usually determine what is wrong based on a physical exam. Blood tests may also be done. TREATMENT  Treatment usually involves taking an antibiotic medicine. HOME CARE INSTRUCTIONS   Take your antibiotic medicine as directed by your health care provider. Finish the antibiotic even if you start to feel better.  Keep the infected arm or leg elevated to reduce swelling.  Apply a warm cloth to the affected area up to 4 times per day to relieve pain.  Take medicines only as directed by your health care provider.  Keep all follow-up visits as directed by your health care provider. SEEK MEDICAL CARE IF:   You notice red streaks coming from the infected area.  Your red area gets larger or turns dark in color.  Your bone or joint underneath the infected area becomes painful after the skin has healed.  Your infection returns in the same area or another area.  You notice a swollen bump in the infected area.  You develop new symptoms.  You have a fever. SEEK IMMEDIATE MEDICAL  CARE IF:   You feel very sleepy.  You develop vomiting or diarrhea.  You have a general ill feeling (malaise) with muscle aches and pains.   This information is not intended to replace advice given to you by your health care provider. Make sure you discuss any questions you have with your health care provider.   Document Released: 03/21/2005 Document Revised: 03/02/2015 Document Reviewed: 08/27/2011 Elsevier Interactive Patient Education [employer]2016 Elsevier Inc.

## 2015-09-20 NOTE — ED Notes (Signed)
Pt states she was here last Friday (4 days ago) and was admitted for cellulitis of the face.  Pt states she was unable to finish her course of antibiotics in the hospital, due to she had to leave yesterday because her daughter was sick.  Pt is back to ED today because of shortness of breath, numbness in her forehead, a bump on her eye (beside her tear duct) and swelling on left jaw.  Pt also states "I just don't feel well".  She reports "low-grade fever" in the hospital

## 2015-09-21 ENCOUNTER — Observation Stay
Admission: EM | Admit: 2015-09-21 | Discharge: 2015-09-22 | Disposition: A | Discharge status: Home / Self Care | Payer: Medicare Other | Attending: Internal Medicine | Admitting: Internal Medicine

## 2015-09-21 DIAGNOSIS — Z79899 Other long term (current) drug therapy: Secondary | ICD-10-CM | POA: Insufficient documentation

## 2015-09-21 DIAGNOSIS — D680 Von Willebrand's disease: Secondary | ICD-10-CM | POA: Insufficient documentation

## 2015-09-21 DIAGNOSIS — F4310 Post-traumatic stress disorder, unspecified: Secondary | ICD-10-CM | POA: Insufficient documentation

## 2015-09-21 DIAGNOSIS — F331 Major depressive disorder, recurrent, moderate: Secondary | ICD-10-CM | POA: Insufficient documentation

## 2015-09-21 DIAGNOSIS — M1990 Unspecified osteoarthritis, unspecified site: Secondary | ICD-10-CM | POA: Insufficient documentation

## 2015-09-21 DIAGNOSIS — Z87442 Personal history of urinary calculi: Secondary | ICD-10-CM | POA: Insufficient documentation

## 2015-09-21 DIAGNOSIS — M4800 Spinal stenosis, site unspecified: Secondary | ICD-10-CM | POA: Diagnosis not present

## 2015-09-21 DIAGNOSIS — F319 Bipolar disorder, unspecified: Secondary | ICD-10-CM | POA: Diagnosis not present

## 2015-09-21 DIAGNOSIS — F332 Major depressive disorder, recurrent severe without psychotic features: Secondary | ICD-10-CM | POA: Insufficient documentation

## 2015-09-21 DIAGNOSIS — Z8249 Family history of ischemic heart disease and other diseases of the circulatory system: Secondary | ICD-10-CM | POA: Diagnosis not present

## 2015-09-21 DIAGNOSIS — R0683 Snoring: Secondary | ICD-10-CM | POA: Diagnosis not present

## 2015-09-21 DIAGNOSIS — M797 Fibromyalgia: Secondary | ICD-10-CM | POA: Insufficient documentation

## 2015-09-21 DIAGNOSIS — K219 Gastro-esophageal reflux disease without esophagitis: Secondary | ICD-10-CM | POA: Diagnosis not present

## 2015-09-21 DIAGNOSIS — Z833 Family history of diabetes mellitus: Secondary | ICD-10-CM | POA: Diagnosis not present

## 2015-09-21 DIAGNOSIS — Z803 Family history of malignant neoplasm of breast: Secondary | ICD-10-CM | POA: Diagnosis not present

## 2015-09-21 DIAGNOSIS — F419 Anxiety disorder, unspecified: Secondary | ICD-10-CM | POA: Insufficient documentation

## 2015-09-21 DIAGNOSIS — R112 Nausea with vomiting, unspecified: Secondary | ICD-10-CM | POA: Diagnosis not present

## 2015-09-21 DIAGNOSIS — E860 Dehydration: Secondary | ICD-10-CM | POA: Diagnosis not present

## 2015-09-21 DIAGNOSIS — R51 Headache: Secondary | ICD-10-CM | POA: Insufficient documentation

## 2015-09-21 DIAGNOSIS — Z888 Allergy status to other drugs, medicaments and biological substances status: Secondary | ICD-10-CM | POA: Insufficient documentation

## 2015-09-21 DIAGNOSIS — Z23 Encounter for immunization: Secondary | ICD-10-CM | POA: Insufficient documentation

## 2015-09-21 DIAGNOSIS — Z880 Allergy status to penicillin: Secondary | ICD-10-CM | POA: Insufficient documentation

## 2015-09-21 DIAGNOSIS — L03211 Cellulitis of face: Principal | ICD-10-CM | POA: Diagnosis present

## 2015-09-21 DIAGNOSIS — Z90710 Acquired absence of both cervix and uterus: Secondary | ICD-10-CM | POA: Diagnosis not present

## 2015-09-21 DIAGNOSIS — R011 Cardiac murmur, unspecified: Secondary | ICD-10-CM | POA: Diagnosis not present

## 2015-09-21 DIAGNOSIS — F17210 Nicotine dependence, cigarettes, uncomplicated: Secondary | ICD-10-CM | POA: Insufficient documentation

## 2015-09-21 LAB — COMPREHENSIVE METABOLIC PANEL
ALT: 91 U/L — AB (ref 14–54)
AST: 32 U/L (ref 15–41)
Albumin: 4.1 g/dL (ref 3.5–5.0)
Alkaline Phosphatase: 99 U/L (ref 38–126)
Anion gap: 9 (ref 5–15)
BUN: 10 mg/dL (ref 6–20)
CALCIUM: 9.5 mg/dL (ref 8.9–10.3)
CO2: 20 mmol/L — ABNORMAL LOW (ref 22–32)
CREATININE: 0.77 mg/dL (ref 0.44–1.00)
Chloride: 106 mmol/L (ref 101–111)
Glucose, Bld: 125 mg/dL — ABNORMAL HIGH (ref 65–99)
Potassium: 3.1 mmol/L — ABNORMAL LOW (ref 3.5–5.1)
Sodium: 135 mmol/L (ref 135–145)
Total Bilirubin: 0.6 mg/dL (ref 0.3–1.2)
Total Protein: 8.2 g/dL — ABNORMAL HIGH (ref 6.5–8.1)

## 2015-09-21 LAB — HEMOGLOBIN A1C: Hgb A1c MFr Bld: 5.2 % (ref 4.0–6.0)

## 2015-09-21 LAB — CULTURE, BLOOD (ROUTINE X 2)
Culture: NO GROWTH
Culture: NO GROWTH

## 2015-09-21 LAB — CBC
HCT: 43.4 % (ref 35.0–47.0)
Hemoglobin: 15 g/dL (ref 12.0–16.0)
MCH: 30.4 pg (ref 26.0–34.0)
MCHC: 34.5 g/dL (ref 32.0–36.0)
MCV: 88.3 fL (ref 80.0–100.0)
PLATELETS: 231 10*3/uL (ref 150–440)
RBC: 4.91 MIL/uL (ref 3.80–5.20)
RDW: 13.8 % (ref 11.5–14.5)
WBC: 8.1 10*3/uL (ref 3.6–11.0)

## 2015-09-21 LAB — TSH: TSH: 3.443 u[IU]/mL (ref 0.350–4.500)

## 2015-09-21 MED ORDER — ONDANSETRON HCL 4 MG PO TABS: 4.0000 mg | ORAL_TABLET | Freq: Three times a day (TID) | ORAL | Status: DC | PRN

## 2015-09-21 MED ORDER — VANCOMYCIN HCL IN DEXTROSE 1-5 GM/200ML-% IV SOLN
1000.0000 mg | Freq: Once | INTRAVENOUS | Status: DC
  Administered 2015-09-21: 1000 mg via INTRAVENOUS
  Filled 2015-09-21: qty 200

## 2015-09-21 MED ORDER — ACETAMINOPHEN 325 MG PO TABS
650.0000 mg | ORAL_TABLET | Freq: Four times a day (QID) | ORAL | Status: DC | PRN
  Administered 2015-09-21: 650 mg via ORAL
  Filled 2015-09-21: qty 2

## 2015-09-21 MED ORDER — ONDANSETRON HCL 4 MG PO TABS: 4.0000 mg | ORAL_TABLET | Freq: Four times a day (QID) | ORAL | Status: DC | PRN

## 2015-09-21 MED ORDER — PREGABALIN 75 MG PO CAPS
150.0000 mg | ORAL_CAPSULE | Freq: Two times a day (BID) | ORAL | Status: DC
  Administered 2015-09-21 – 2015-09-22 (×3): 150 mg via ORAL
  Filled 2015-09-21 (×3): qty 2

## 2015-09-21 MED ORDER — KETOROLAC TROMETHAMINE 30 MG/ML IJ SOLN
30.0000 mg | Freq: Once | INTRAMUSCULAR | Status: AC
  Administered 2015-09-21: 30 mg via INTRAVENOUS
  Filled 2015-09-21: qty 1

## 2015-09-21 MED ORDER — BIOTIN W/ VITAMINS C & E 1250-7.5-7.5 MCG-MG-UNT PO CHEW: 1.0000 | CHEWABLE_TABLET | Freq: Every day | ORAL | Status: DC

## 2015-09-21 MED ORDER — ONDANSETRON HCL 4 MG/2ML IJ SOLN
4.0000 mg | Freq: Four times a day (QID) | INTRAMUSCULAR | Status: DC | PRN
  Administered 2015-09-21: 4 mg via INTRAVENOUS
  Filled 2015-09-21: qty 2

## 2015-09-21 MED ORDER — ACETAMINOPHEN 650 MG RE SUPP: 650.0000 mg | Freq: Four times a day (QID) | RECTAL | Status: DC | PRN

## 2015-09-21 MED ORDER — CALCIUM CARBONATE ANTACID 500 MG PO CHEW
1.0000 | CHEWABLE_TABLET | ORAL | Status: DC | PRN
  Administered 2015-09-21: 200 mg via ORAL
  Filled 2015-09-21: qty 1

## 2015-09-21 MED ORDER — PROCHLORPERAZINE EDISYLATE 5 MG/ML IJ SOLN
10.0000 mg | INTRAMUSCULAR | Status: DC | PRN
  Administered 2015-09-21 – 2015-09-22 (×3): 10 mg via INTRAVENOUS
  Filled 2015-09-21 (×4): qty 2

## 2015-09-21 MED ORDER — MORPHINE SULFATE (PF) 2 MG/ML IV SOLN: 1.0000 mg | INTRAVENOUS | Status: DC | PRN

## 2015-09-21 MED ORDER — POTASSIUM CHLORIDE IN NACL 40-0.9 MEQ/L-% IV SOLN
INTRAVENOUS | Status: DC
  Administered 2015-09-21 – 2015-09-22 (×3): 125 mL/h via INTRAVENOUS
  Filled 2015-09-21 (×6): qty 1000

## 2015-09-21 MED ORDER — PNEUMOCOCCAL VAC POLYVALENT 25 MCG/0.5ML IJ INJ
0.5000 mL | INJECTION | INTRAMUSCULAR | Status: AC
  Administered 2015-09-22: 0.5 mL via INTRAMUSCULAR
  Filled 2015-09-21: qty 0.5

## 2015-09-21 MED ORDER — IBUPROFEN 400 MG PO TABS
800.0000 mg | ORAL_TABLET | Freq: Three times a day (TID) | ORAL | Status: DC | PRN
  Administered 2015-09-21: 800 mg via ORAL
  Filled 2015-09-21: qty 2

## 2015-09-21 MED ORDER — QUETIAPINE FUMARATE 100 MG PO TABS
100.0000 mg | ORAL_TABLET | Freq: Every day | ORAL | Status: DC
  Administered 2015-09-21: 100 mg via ORAL
  Filled 2015-09-21: qty 1

## 2015-09-21 MED ORDER — PANTOPRAZOLE SODIUM 40 MG PO TBEC
40.0000 mg | DELAYED_RELEASE_TABLET | Freq: Every day | ORAL | Status: DC
  Administered 2015-09-21 – 2015-09-22 (×2): 40 mg via ORAL
  Filled 2015-09-21 (×2): qty 1

## 2015-09-21 MED ORDER — MELATONIN 10 MG SL SUBL: 10.0000 mg | SUBLINGUAL_TABLET | Freq: Every evening | SUBLINGUAL | Status: DC | PRN

## 2015-09-21 MED ORDER — ENOXAPARIN SODIUM 40 MG/0.4ML ~~LOC~~ SOLN
40.0000 mg | SUBCUTANEOUS | Status: DC
  Administered 2015-09-21: 40 mg via SUBCUTANEOUS
  Filled 2015-09-21 (×2): qty 0.4

## 2015-09-21 MED ORDER — ONDANSETRON HCL 4 MG/2ML IJ SOLN
8.0000 mg | Freq: Once | INTRAMUSCULAR | Status: AC
  Administered 2015-09-21: 8 mg via INTRAVENOUS
  Filled 2015-09-21: qty 4

## 2015-09-21 MED ORDER — DOCUSATE SODIUM 100 MG PO CAPS
100.0000 mg | ORAL_CAPSULE | Freq: Two times a day (BID) | ORAL | Status: DC
  Administered 2015-09-21: 100 mg via ORAL
  Filled 2015-09-21 (×2): qty 1

## 2015-09-21 MED ORDER — ONDANSETRON HCL 4 MG/2ML IJ SOLN: 4.0000 mg | Freq: Four times a day (QID) | INTRAMUSCULAR | Status: DC | PRN

## 2015-09-21 MED ORDER — CLINDAMYCIN PHOSPHATE 600 MG/50ML IV SOLN
600.0000 mg | Freq: Three times a day (TID) | INTRAVENOUS | Status: DC
  Administered 2015-09-21 – 2015-09-22 (×4): 600 mg via INTRAVENOUS
  Filled 2015-09-21 (×6): qty 50
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
## 2015-09-21 NOTE — H&P (Signed)
Casey Hodges is an 40 y.o. female.   Chief Complaint: Nausea and vomiting HPI: The patient presents to the emergency department after multiple evaluations this week for cellulitis of her face. She left AMA from the hospital a few days ago, was evaluated at [facility] and discharged home only to come back again to [facility] and received Bactrim for discharge home. Now she returns again complaining of nausea and vomiting as well as aches all over. The patient also states she has a headache that starts her forehead and radiates to her occiput. She's had multiple episodes of nonbloody nonbilious emesis. Her original injury occurred likely secondary to a fall while the patient's mother admits that she may have been intoxicated due to Percocet abuse. In the emergency department the patient continued to have nausea and vomiting despite antiemetics as well as complaining of headache. In light of her facial swelling and constellation of symptoms the emergency department called the hospitalist service for further management.  Past Medical History  Diagnosis Date  . Bipolar affective ([place])     pt reported  . Uterine polyp   . Eating disorder     Under control per patient  . Renal disorder   . Painful menstrual periods   . Abnormal vaginal Pap smear     10+ years ago- no colpo repeat was normal  . Painful intercourse   . GERD (gastroesophageal reflux disease)   . VWD (acquired von Willebrand's disease) ([place])   . Anxiety   . Spinal stenosis   . Kidney stone   . Bipolar disorder ([place])   . AR (allergic rhinitis)   . Pelvic pain in female   . Fibromyalgia   . PTSD (post-traumatic stress disorder)     Past Surgical History  Procedure Laterality Date  . Tubal ligation    . Laparoscopy abdomen diagnostic    . Hysteroscopy      removed polyps  . Laparoscopic vaginal hysterectomy  2015    at [facility]  . Abdominal hysterectomy    . Laparoscopy Left 01/10/2015    Procedure: LAPAROSCOPY  OPERATIVE with biopsy, left oopherectomy;  Surgeon: Brayton Mars, MD;  Location: [facility] ORS;  Service: Gynecology;  Laterality: Left;    Family History  Problem Relation Age of Onset  . Breast cancer Paternal Grandmother   . Diabetes Maternal Grandmother   . Hypertension Father   . Sleep apnea Mother   . Diabetes Maternal Aunt   . Diabetes Maternal Uncle    Social History:  reports that she has been smoking Cigarettes.  She started smoking about 20 years ago. She has been smoking about 0.75 packs per day. She has never used smokeless tobacco. She reports that she drinks alcohol. She reports that she does not use illicit drugs.  Allergies:  Allergies  Allergen Reactions  . Wellbutrin [Bupropion Hcl] Other (See Comments)    Reaction:  Suicidal   . Augmentin [Amoxicillin-Pot Clavulanate] Diarrhea, Nausea And Vomiting and Other (See Comments)    Has patient had a PCN reaction causing immediate rash, facial/tongue/throat swelling, SOB or lightheadedness with hypotension: No Has patient had a PCN reaction causing severe rash involving mucus membranes or skin necrosis: No Has patient had a PCN reaction that required hospitalization No Has patient had a PCN reaction occurring within the last 10 years: Yes If all of the above answers are "NO", then may proceed with Cephalosporin use.  Marland Kitchen Penicillins Diarrhea, Nausea And Vomiting and Other (See Comments)  Has patient had a PCN reaction causing immediate rash, facial/tongue/throat swelling, SOB or lightheadedness with hypotension: No Has patient had a PCN reaction causing severe rash involving mucus membranes or skin necrosis: No Has patient had a PCN reaction that required hospitalization No Has patient had a PCN reaction occurring within the last 10 years: Yes If all of the above answers are "NO", then may proceed with Cephalosporin use.  Burtis Junes  [Hydrocodone-Ibuprofen] Nausea And Vomiting  . Lasix [Furosemide] Rash  .  Nitrofurantoin Monohyd Macro Rash     (Not in a hospital admission)  Results for orders placed or performed during the hospital encounter of 09/21/15 (from the past 48 hour(s))  CBC     Status: None   Collection Time: 09/21/15  6:05 AM  Result Value Ref Range   WBC 8.1 3.6 - 11.0 K/uL   RBC 4.91 3.80 - 5.20 MIL/uL   Hemoglobin 15.0 12.0 - 16.0 g/dL   HCT 43.4 35.0 - 47.0 %   MCV 88.3 80.0 - 100.0 fL   MCH 30.4 26.0 - 34.0 pg   MCHC 34.5 32.0 - 36.0 g/dL   RDW 13.8 11.5 - 14.5 %   Platelets 231 150 - 440 K/uL  Comprehensive metabolic panel     Status: Abnormal   Collection Time: 09/21/15  6:05 AM  Result Value Ref Range   Sodium 135 135 - 145 mmol/L   Potassium 3.1 (L) 3.5 - 5.1 mmol/L   Chloride 106 101 - 111 mmol/L   CO2 20 (L) 22 - 32 mmol/L   Glucose, Bld 125 (H) 65 - 99 mg/dL   BUN 10 6 - 20 mg/dL   Creatinine, Ser 0.77 0.44 - 1.00 mg/dL   Calcium 9.5 8.9 - 10.3 mg/dL   Total Protein 8.2 (H) 6.5 - 8.1 g/dL   Albumin 4.1 3.5 - 5.0 g/dL   AST 32 15 - 41 U/L   ALT 91 (H) 14 - 54 U/L   Alkaline Phosphatase 99 38 - 126 U/L   Total Bilirubin 0.6 0.3 - 1.2 mg/dL   GFR calc non Af Amer >60 >60 mL/min   GFR calc Af Amer >60 >60 mL/min    Comment: (NOTE) The eGFR has been calculated using the CKD EPI equation. This calculation has not been validated in all clinical situations. eGFR's persistently <60 mL/min signify possible Chronic Kidney Disease.    Anion gap 9 5 - 15   Dg Chest 2 View  09/20/2015  CLINICAL DATA:  Shortness of Breath EXAM: CHEST  2 VIEW COMPARISON:  07/24/2015 FINDINGS: The heart size and mediastinal contours are within normal limits. Both lungs are clear. The visualized skeletal structures are unremarkable. IMPRESSION: No active cardiopulmonary disease. Electronically Signed   By: Inez Catalina M.D.   On: 09/20/2015 09:55    Review of Systems  Constitutional: Positive for fever (subjective). Negative for chills.  HENT: Negative for sore throat and  tinnitus.   Eyes: Negative for blurred vision and redness.  Respiratory: Negative for cough and shortness of breath.   Cardiovascular: Negative for chest pain, palpitations, orthopnea and PND.  Gastrointestinal: Negative for nausea, vomiting, abdominal pain and diarrhea.  Genitourinary: Negative for dysuria, urgency and frequency.  Musculoskeletal: Negative for myalgias and joint pain.  Skin: Negative for rash.       No lesions  Neurological: Positive for headaches. Negative for speech change, focal weakness and weakness.  Endo/Heme/Allergies: Does not bruise/bleed easily.       No temperature intolerance  Psychiatric/Behavioral: Negative for depression and suicidal ideas.    Blood pressure 112/73, pulse 84, temperature 97.7 F (36.5 C), temperature source Oral, resp. rate 21, height 5' 2"  (1.575 m), weight 72.576 kg (160 lb), last menstrual period 09/16/2015, SpO2 95 %. Physical Exam  Vitals reviewed. Constitutional: She is oriented to person, place, and time. She appears well-developed and well-nourished. No distress.  HENT:  Head: Normocephalic and atraumatic.  Mouth/Throat: Oropharynx is clear and moist.  Eyes: Conjunctivae and EOM are normal. Pupils are equal, round, and reactive to light. No scleral icterus.  Neck: Normal range of motion. Neck supple. No JVD present. No tracheal deviation present. No thyromegaly present.  Cardiovascular: Normal rate, regular rhythm and normal heart sounds.  Exam reveals no gallop and no friction rub.   No murmur heard. Respiratory: Effort normal and breath sounds normal.  GI: Soft. Bowel sounds are normal. She exhibits no distension. There is no tenderness.  Genitourinary:  Deferred  Musculoskeletal: Normal range of motion. She exhibits no edema.  Lymphadenopathy:    She has no cervical adenopathy.  Neurological: She is alert and oriented to person, place, and time. No cranial nerve deficit. She exhibits normal muscle tone.  Skin: Skin is  warm and dry. No rash noted. There is erythema.     Psychiatric: She has a normal mood and affect. Her behavior is normal. Judgment and thought content normal.     Assessment/Plan This is a 40 year old female admitted with cellulitis of the face. 1. Cellulitis: The patient is received 1 dose of vancomycin in the emergency department. I have ordered clindamycin for mild nonpurulent cellulitis. The patient has no signs or symptoms of sepsis. I believe her her infection is preseptal. She has no meningismus. 2. Nausea and vomiting: Anti-emetics as needed 3. Dehydration: Replace electrolytes; hydrate with intravenous fluid. Encourage by mouth intake 4. DVT prophylaxis: Lovenox 5. GI prophylaxis: None The patient is a full code. Time spent on admission was inpatient care approximately 45 minutes  Harrie Foreman, MD 09/21/2015, 7:18 AM

## 2015-09-21 NOTE — Progress Notes (Signed)
Patient complaining of nausea with no relieve from Zofran, notified MD. (Dr. Dimas AguasHoward), medication ordered and heating pad for c/o back pain, will continue to monitor.

## 2015-09-21 NOTE — Consult Note (Signed)
[place] Psychiatry Consult   Reason for Consult:  Depression and Hallucination  Referring Physician:  Nadara Mustard Patient Identification: Casey Hodges MRN:  [id number] Principal Diagnosis: MDD, recurrent moderate                                      Diagnosis:   Patient Active Problem List   Diagnosis Date Noted  . Cellulitis of face [L03.211] 09/21/2015  . GERD (gastroesophageal reflux disease) [K21.9] 09/16/2015  . Sepsis ([place]) [A41.9] 09/16/2015  . Facial cellulitis [J18.841] 09/16/2015  . Cervical spine pain [M54.2] 09/08/2015  . Smoker [Z72.0] 07/25/2015  . Hypotension [I95.9] 07/25/2015  . Upper respiratory infection [J06.9] 07/12/2015  . Snoring [R06.83] 06/09/2015  . Cardiac murmur [R01.1] 05/25/2015  . Cloudy urine [R82.99] 05/25/2015  . Numbness of foot [R20.8] 04/20/2015  . Drug-induced nausea and vomiting [R11.2] 03/22/2015  . Need for immunization against influenza [Z23] 03/21/2015  . Cyst, bone [M85.60] 02/14/2015  . Pelvic adhesive disease [N73.6] 01/21/2015  . Tick bite [T14.8, W57.XXXA] 01/20/2015  . Arthritis [M19.90] 12/19/2014  . Calculus of kidney [N20.0] 12/13/2014  . Patchy loss of hair [L65.9] 12/13/2014  . Anxiety disorder [F41.9] 12/13/2014  . Postural dizziness [R42] 12/13/2014  . Subcutaneous cyst [L72.9] 12/13/2014  . Nephrolithiasis [N20.0] 05/23/2012  . Chronic pelvic pain in female [N94.9, G89.29] 05/23/2012  . Fibromyalgia [M79.7] 05/23/2012  . Bipolar disorder with depression ([place]) [F31.30] 06/05/2011    Total Time spent with patient: 1 hour  Subjective:   Casey Hodges is a 40 y.o. female patient  who presents with facial swelling and tenderness, as well as some cold chills. She was recently discharged from the hospital and was readmitted again with the similar symptoms  HPI:    Casey Hodges is a 40 y.o. widowed female who presents  to the emergency department after multiple evaluations this week for cellulitis of her face. She  left AMA from the hospital a few days ago, was evaluated at [facility] and discharged home only to come back again to [facility] and received Bactrim for discharge home. Now she returns again complaining of nausea and vomiting as well as aches all over. She was complaining of headache and nonbilious nonbloody emesis Her original injury occurred likely secondary to a fall while the patient's mother admits that she may have been intoxicated due to [place] abuse Patient was readmitted for facial swelling and management of her edema Psychiatric consult was placed for management of her anxiety. During my interview patient was lying comfortably in the bed. She reported that she went to the [facility] hospital but she was not admitted over there. She reported that she came here as she is looking for help. She reported that she is interested in completing her IV course of antibiotics. She currently denied having any withdrawal symptoms. She denied abusing any drugs or alcohol at this time. She reported that she does not have any suicidal homicidal ideations or plans   Past Psychiatric History:   Patient has been diagnosed with bipolar disorder and was following at [place]. She reported that she has never attempted suicide. She denied having any suicidal ideations or plans at this time.   Risk to Self: Is patient at risk for suicide?: No Risk to Others:   Prior Inpatient Therapy:   Prior Outpatient Therapy:    Past Medical History:  Past Medical History  Diagnosis Date  .  Bipolar affective ([place])     pt reported  . Uterine polyp   . Eating disorder     Under control per patient  . Renal disorder   . Painful menstrual periods   . Abnormal vaginal Pap smear     10+ years ago- no colpo repeat was normal  . Painful intercourse   . GERD (gastroesophageal reflux disease)   . VWD (acquired von Willebrand's disease) ([place])   . Anxiety   . Spinal stenosis   . Kidney stone   . Bipolar disorder ([place])   . AR  (allergic rhinitis)   . Pelvic pain in female   . Fibromyalgia   . PTSD (post-traumatic stress disorder)     Past Surgical History  Procedure Laterality Date  . Tubal ligation    . Laparoscopy abdomen diagnostic    . Hysteroscopy      removed polyps  . Laparoscopic vaginal hysterectomy  2015    at [facility]  . Abdominal hysterectomy    . Laparoscopy Left 01/10/2015    Procedure: LAPAROSCOPY OPERATIVE with biopsy, left oopherectomy;  Surgeon: Brayton Mars, MD;  Location: [facility] ORS;  Service: Gynecology;  Laterality: Left;   Family History:  Family History  Problem Relation Age of Onset  . Breast cancer Paternal Grandmother   . Diabetes Maternal Grandmother   . Hypertension Father   . Sleep apnea Mother   . Diabetes Maternal Aunt   . Diabetes Maternal Uncle    Family Psychiatric  History: she  denied any history of psychiatric illness in the family  Social History:  History  Alcohol Use No    Comment: Socially     History  Drug Use No    Social History   Social History  . Marital Status: Widowed    Spouse Name: N/A  . Number of Children: 1  . Years of Education: N/A   Occupational History  . Un-employed due to bipolar    Social History Main Topics  . Smoking status: Current Every Day Smoker -- 0.50 packs/day    Types: Cigarettes    Start date: 05/18/1995  . Smokeless tobacco: Never Used  . Alcohol Use: No     Comment: Socially  . Drug Use: No  . Sexual Activity: No   Other Topics Concern  . None   Social History Narrative   Regular Exercise -  NO   Daily Caffeine Use:  1 cup coffee in am      1 child, one step child         Additional Social History:    Allergies:   Allergies  Allergen Reactions  . Wellbutrin [Bupropion Hcl] Other (See Comments)    Reaction:  Suicidal   . Augmentin [Amoxicillin-Pot Clavulanate] Diarrhea, Nausea And Vomiting and Other (See Comments)    Has patient had a PCN reaction causing immediate rash,  facial/tongue/throat swelling, SOB or lightheadedness with hypotension: No Has patient had a PCN reaction causing severe rash involving mucus membranes or skin necrosis: No Has patient had a PCN reaction that required hospitalization No Has patient had a PCN reaction occurring within the last 10 years: Yes If all of the above answers are "NO", then may proceed with Cephalosporin use.  Marland Kitchen Penicillins Diarrhea, Nausea And Vomiting and Other (See Comments)    Has patient had a PCN reaction causing immediate rash, facial/tongue/throat swelling, SOB or lightheadedness with hypotension: No Has patient had a PCN reaction causing severe rash involving mucus membranes or skin  necrosis: No Has patient had a PCN reaction that required hospitalization No Has patient had a PCN reaction occurring within the last 10 years: Yes If all of the above answers are "NO", then may proceed with Cephalosporin use.  Burtis Junes  [Hydrocodone-Ibuprofen] Nausea And Vomiting  . Lasix [Furosemide] Rash  . Nitrofurantoin Monohyd Macro Rash    Labs:  Results for orders placed or performed during the hospital encounter of 09/21/15 (from the past 48 hour(s))  CBC     Status: None   Collection Time: 09/21/15  6:05 AM  Result Value Ref Range   WBC 8.1 3.6 - 11.0 K/uL   RBC 4.91 3.80 - 5.20 MIL/uL   Hemoglobin 15.0 12.0 - 16.0 g/dL   HCT 43.4 35.0 - 47.0 %   MCV 88.3 80.0 - 100.0 fL   MCH 30.4 26.0 - 34.0 pg   MCHC 34.5 32.0 - 36.0 g/dL   RDW 13.8 11.5 - 14.5 %   Platelets 231 150 - 440 K/uL  Comprehensive metabolic panel     Status: Abnormal   Collection Time: 09/21/15  6:05 AM  Result Value Ref Range   Sodium 135 135 - 145 mmol/L   Potassium 3.1 (L) 3.5 - 5.1 mmol/L   Chloride 106 101 - 111 mmol/L   CO2 20 (L) 22 - 32 mmol/L   Glucose, Bld 125 (H) 65 - 99 mg/dL   BUN 10 6 - 20 mg/dL   Creatinine, Ser 0.77 0.44 - 1.00 mg/dL   Calcium 9.5 8.9 - 10.3 mg/dL   Total Protein 8.2 (H) 6.5 - 8.1 g/dL   Albumin 4.1 3.5  - 5.0 g/dL   AST 32 15 - 41 U/L   ALT 91 (H) 14 - 54 U/L   Alkaline Phosphatase 99 38 - 126 U/L   Total Bilirubin 0.6 0.3 - 1.2 mg/dL   GFR calc non Af Amer >60 >60 mL/min   GFR calc Af Amer >60 >60 mL/min    Comment: (NOTE) The eGFR has been calculated using the CKD EPI equation. This calculation has not been validated in all clinical situations. eGFR's persistently <60 mL/min signify possible Chronic Kidney Disease.    Anion gap 9 5 - 15  TSH     Status: None   Collection Time: 09/21/15  6:05 AM  Result Value Ref Range   TSH 3.443 0.350 - 4.500 uIU/mL    Current Facility-Administered Medications  Medication Dose Route Frequency Provider Last Rate Last Dose  . 0.9 % NaCl with KCl 40 mEq / L  infusion   Intravenous Continuous Harrie Foreman, MD 125 mL/hr at 09/21/15 0907 125 mL/hr at 09/21/15 0907  . acetaminophen (TYLENOL) tablet 650 mg  650 mg Oral Q6H PRN Harrie Foreman, MD       Or  . acetaminophen (TYLENOL) suppository 650 mg  650 mg Rectal Q6H PRN Harrie Foreman, MD      . calcium carbonate (TUMS - dosed in mg elemental calcium) chewable tablet 200 mg of elemental calcium  1 tablet Oral Q4H PRN Lytle Butte, MD   200 mg of elemental calcium at 09/21/15 0922  . clindamycin (CLEOCIN) IVPB 600 mg  600 mg Intravenous 3 times per day Harrie Foreman, MD   600 mg at 09/21/15 1453  . docusate sodium (COLACE) capsule 100 mg  100 mg Oral BID Harrie Foreman, MD   100 mg at 09/21/15 0834  . enoxaparin (LOVENOX) injection 40 mg  40 mg  Subcutaneous Q24H Harrie Foreman, MD   40 mg at 09/21/15 7253  . ibuprofen (ADVIL,MOTRIN) tablet 800 mg  800 mg Oral TID PRN Harrie Foreman, MD   800 mg at 09/21/15 1453  . morphine 2 MG/ML injection 1 mg  1 mg Intravenous Q3H PRN Harrie Foreman, MD      . ondansetron [facility]) tablet 4 mg  4 mg Oral Q6H PRN Harrie Foreman, MD       Or  . ondansetron [facility]) injection 4 mg  4 mg Intravenous Q6H PRN Harrie Foreman, MD   4 mg at  09/21/15 0853  . ondansetron (ZOFRAN) injection 4 mg  4 mg Intravenous Q6H PRN Lytle Butte, MD      . pantoprazole (PROTONIX) EC tablet 40 mg  40 mg Oral Daily Lytle Butte, MD   40 mg at 09/21/15 6644  . [START ON 09/22/2015] pneumococcal 23 valent vaccine (PNU-IMMUNE) injection 0.5 mL  0.5 mL Intramuscular Tomorrow-1000 Lytle Butte, MD      . pregabalin (LYRICA) capsule 150 mg  150 mg Oral BID Harrie Foreman, MD   150 mg at 09/21/15 0347  . prochlorperazine (COMPAZINE) injection 10 mg  10 mg Intravenous Q4H PRN Lytle Butte, MD   10 mg at 09/21/15 1059  . QUEtiapine (SEROQUEL) tablet 100 mg  100 mg Oral QHS Harrie Foreman, MD        Musculoskeletal: Strength & Muscle Tone: within normal limits Gait & Station: normal Patient leans: N/A  Psychiatric Specialty Exam: Review of Systems  Psychiatric/Behavioral: Positive for depression. Negative for hallucinations.    Blood pressure 108/68, pulse 80, temperature 98.2 F (36.8 C), temperature source Oral, resp. rate 18, height _0  (1.575 m), weight 160 lb (72.576 kg), last menstrual period 09/16/2015, SpO2 96 %.Body mass index is 29.26 kg/(m^2).  General Appearance: Casual  Eye Contact::  Fair  Speech:  Clear and Coherent and Normal Rate  Volume:  Normal  Mood:  Euthymic  Affect:  Congruent and Depressed  Thought Process:  Coherent and Goal Directed  Orientation:  Full (Time, Place, and Person)  Thought Content:  WDL  Suicidal Thoughts:  No  Homicidal Thoughts:  No  Memory:  Immediate;   Fair  Judgement:  Fair  Insight:  Fair  Psychomotor Activity:  Normal  Concentration:  Fair  Recall:  [employer] of Knowledge:Fair  Language: Fair  Akathisia:  No  Handed:  Right  AIMS (if indicated):     Assets:  Communication Skills Physical Health Social Support Talents/Skills  ADL's:  Intact  Cognition: WNL  Sleep:      Treatment Plan Summary: Daily contact with patient to assess and evaluate symptoms and progress in  treatment and Medication management  Disposition: No evidence of imminent risk to self or others at present.   Patient does not meet criteria for psychiatric inpatient admission. Supportive therapy provided about ongoing stressors. Discussed crisis plan, support from social network, calling 911, coming to the Emergency Department, and calling Suicide Hotline.  Patient understands the risk and benefits of continued treatment at this time. She currently denied having any suicidal homicidal ideations or plans. She reported that she has been compliant with her medications and will take them as prescribed. No new medications were started. Patient can be discharged when  she becomes clinically stable and she will follow-up at [facility]     This note was generated in part or whole with voice recognition  software. Voice regonition is usually quite accurate but there are transcription errors that can and very often do occur. I apologize for any typographical errors that were not detected and corrected.   Rainey Pines, MD 09/21/2015 3:36 PM

## 2015-09-21 NOTE — ED Provider Notes (Signed)
[facility] Emergency Department Provider Note  ____________________________________________  Time seen: 5:50AM  I have reviewed the triage vital signs and the nursing notes.   HISTORY  Chief Complaint Emesis     HPI Casey Hodges is a 40 y.o. female with PMH recent Facial Cellulitis, Fibromyalgia, Bipolar Disorder  presents with worsening left facial pain nausea posterior headache. Patient left the hospital AMA on + years ago- no colpo repeat was normal  . Painful intercourse   . GERD (gastroesophageal reflux disease)   . VWD (acquired von Willebrand's disease) (HCC)   . Anxiety   . Spinal stenosis   . Kidney stone   . Bipolar disorder (HCC)   . AR (allergic rhinitis)   . Pelvic pain in female   . Fibromyalgia   . PTSD (post-traumatic stress disorder)     Patient Active Problem List   Diagnosis Date Noted  . GERD (gastroesophageal reflux disease) 09/16/2015  . Sepsis (HCC) 09/16/2015  . Facial cellulitis 09/16/2015  . Cervical spine pain 09/08/2015  . Smoker 07/25/2015  . Hypotension 07/25/2015  . Upper respiratory infection 07/12/2015  . Snoring 06/09/2015  . Cardiac murmur 05/25/2015  . Cloudy urine 05/25/2015  . Numbness of foot 04/20/2015  . Drug-induced nausea and vomiting 03/22/2015  . Need for immunization against influenza 03/21/2015  . Cyst, bone 02/14/2015  . Pelvic adhesive disease 01/21/2015  . Tick bite 01/20/2015  . Arthritis 12/19/2014  . Calculus of kidney 12/13/2014  . Patchy loss of hair 12/13/2014  . Anxiety disorder 12/13/2014  . Postural dizziness 12/13/2014  . Subcutaneous cyst 12/13/2014  . Nephrolithiasis  05/23/2012  . Chronic pelvic pain in female 05/23/2012  . Fibromyalgia 05/23/2012  . Bipolar disorder with depression (HCC) 06/05/2011    Past Surgical History  Procedure Laterality Date  . Tubal ligation    . Laparoscopy abdomen diagnostic    . Hysteroscopy      removed polyps  . Laparoscopic vaginal hysterectomy  2015    at [facility]  . Abdominal hysterectomy    . Laparoscopy Left 01/10/2015    Procedure: LAPAROSCOPY OPERATIVE with biopsy, left oopherectomy;  Surgeon: Herold Harms, MD;  Location: [facility] ORS;  Service: Gynecology;  Laterality: Left;    Current Outpatient Rx  Name  Route  Sig  Dispense  Refill  . Biotin w/ Vitamins C & E (HAIR SKIN & NAILS GUMMIES) 1250-7.5-7.5 MCG-MG-UNT CHEW   Oral   Chew 1 each by mouth daily.         . clindamycin (CLEOCIN) 300 MG capsule   Oral   Take 1 capsule (300 mg total) by mouth 3 (three) times daily.   30 capsule   0   . ibuprofen (ADVIL,MOTRIN) 800 MG tablet   Oral   Take 800 mg by mouth 3 (three) times daily as needed for mild pain.         Marland Kitchen LYRICA 150 MG capsule   Oral   Take 1 capsule (150 mg total) by mouth 2 (two) times daily.   60 capsule   0     Dispense  as written.   . Melatonin 10 MG SUBL   Sublingual   Place 10 mg under the tongue at bedtime as needed (for sleep).          . ondansetron (ZOFRAN) 4 MG tablet   Oral   Take 1 tablet (4 mg total) by mouth every 8 (eight) hours as needed for nausea or vomiting. Patient not taking: Reported on 09/17/2015   10 tablet   0   . oxyCODONE-acetaminophen (PERCOCET) 7.5-325 MG tablet   Oral   Take 1 tablet by mouth every 8 (eight) hours as needed for severe pain. Patient taking differently: Take 2 tablets by mouth every 6 (six) hours as needed for severe pain.    90 tablet   0   . promethazine (PHENERGAN) 25 MG tablet   Oral   Take 1 tablet (25 mg total) by mouth daily as needed for nausea or vomiting. Patient not taking: Reported on 09/17/2015   30  tablet   0   . QUEtiapine (SEROQUEL) 100 MG tablet   Oral   Take 100 mg by mouth at bedtime.         . sulfamethoxazole-trimethoprim (BACTRIM DS,SEPTRA DS) 800-160 MG tablet   Oral   Take 1 tablet by mouth 2 (two) times daily.   10 tablet   0     Allergies Wellbutrin; Augmentin; Penicillins; Vicoprofen ; Lasix; and Nitrofurantoin monohyd macro  Family History  Problem Relation Age of Onset  . Breast cancer Paternal Grandmother   . Diabetes Maternal Grandmother   . Hypertension Father   . Sleep apnea Mother   . Diabetes Maternal Aunt   . Diabetes Maternal Uncle     Social History Social History  Substance Use Topics  . Smoking status: Current Every Day Smoker -- 0.75 packs/day    Types: Cigarettes    Start date: 05/18/1995  . Smokeless tobacco: Never Used  . Alcohol Use: 0.0 oz/week    0 Standard drinks or equivalent per week     Comment: Socially    Review of Systems  Constitutional: Negative for fever. Eyes: Negative for visual changes. ENT: Negative for sore throat. Cardiovascular: Negative for chest pain. Respiratory: Negative for shortness of breath. Gastrointestinal: Negative for abdominal pain, vomiting and diarrhea. Genitourinary: Negative for dysuria. Musculoskeletal: Negative for back pain. Skin: Negative for rash. Neurological: Negative for headaches, focal weakness or numbness.   10-point ROS otherwise negative.  ____________________________________________   PHYSICAL EXAM:  VITAL SIGNS: ED Triage Vitals  Enc Vitals Group     BP 09/21/15 0540 110/78 mmHg     Pulse Rate 09/21/15 0540 100     Resp 09/21/15 0540 18     Temp 09/21/15 0540 97.7 F (36.5 C)     Temp Source 09/21/15 0540 Oral     SpO2 09/21/15 0540 98 %     Weight 09/21/15 0540 160 lb (72.576 kg)     Height 09/21/15 0540  (1.575 m)     Head Cir --      Peak Flow --      Pain Score 09/21/15 0541 5     Pain Loc --      Pain Edu? --      Excl. in GC? --      Constitutional: Alert and oriented. Well appearing and in no distress. Eyes: Conjunctivae are normal. PERRL. Normal extraocular movements. ENT   Head: Normocephalic and atraumatic. Left facial swelling without overlying erythema   Nose: No congestion/rhinnorhea.  Mouth/Throat: Mucous membranes are moist.   Neck: No stridor. Hematological/Lymphatic/Immunilogical: No cervical lymphadenopathy. Cardiovascular: Normal rate, regular rhythm. Normal and symmetric distal pulses are present in all extremities. No murmurs, rubs, or gallops. Respiratory: Normal respiratory effort without tachypnea nor retractions. Breath sounds are clear and equal bilaterally. No wheezes/rales/rhonchi. Gastrointestinal: Soft and nontender. No distention. There is no CVA tenderness. Genitourinary: deferred Musculoskeletal: Nontender with normal range of motion in all extremities. No joint effusions.  No lower extremity tenderness nor edema. Neurologic:  Normal speech and language. No gross focal neurologic deficits are appreciated. Speech is normal.  Skin:  Skin is warm, dry and intact. No rash noted. Psychiatric: Mood and affect are normal. Speech and behavior are normal. Patient exhibits appropriate insight and judgment.  ____________________________________________    LABS (pertinent positives/negatives)  Labs Reviewed  COMPREHENSIVE METABOLIC PANEL - Abnormal; Notable for the following:    Potassium 3.1 (*)    CO2 20 (*)    Glucose, Bld 125 (*)    Total Protein 8.2 (*)    ALT 91 (*)    All other components within normal limits  CBC       INITIAL IMPRESSION / ASSESSMENT AND PLAN / ED COURSE  Pertinent labs & imaging results that were available during my care of the patient were reviewed by me and considered in my medical decision making (see chart for details).  [facility] for further evaluation and  management  ____________________________________________   FINAL CLINICAL IMPRESSION(S) / ED DIAGNOSES  Final diagnoses:  Facial cellulitis      Darci Currentandolph N Brown, MD 09/23/15 0800

## 2015-09-21 NOTE — Progress Notes (Signed)
Notified Dr. Clint GuyHower that patient is requesting something for heartburn.

## 2015-09-21 NOTE — ED Notes (Addendum)
Pt to triage via w/c with no distress noted; pt reports N/V since last night; currently taking antibiotics for cellulitis; c/o occipital HA from vomiting; st was told to stop antibiotic due to vomiting

## 2015-09-21 NOTE — ED Notes (Signed)
Pt was admitted for cellulitis, was d/c yesterday and c/o emesis and not being able to keep antibiotic down. Pt A&O.  Pt mother states she is urinating on herself.

## 2015-09-21 NOTE — Progress Notes (Signed)

## 2015-09-22 DIAGNOSIS — L03211 Cellulitis of face: Secondary | ICD-10-CM | POA: Diagnosis not present

## 2015-09-22 DIAGNOSIS — R112 Nausea with vomiting, unspecified: Secondary | ICD-10-CM | POA: Diagnosis not present

## 2015-09-22 LAB — BASIC METABOLIC PANEL
ANION GAP: 3 — AB (ref 5–15)
BUN: 8 mg/dL (ref 6–20)
CALCIUM: 8.4 mg/dL — AB (ref 8.9–10.3)
CHLORIDE: 114 mmol/L — AB (ref 101–111)
CO2: 19 mmol/L — AB (ref 22–32)
Creatinine, Ser: 0.59 mg/dL (ref 0.44–1.00)
GFR calc non Af Amer: >60 mL/min (ref >60)
GLUCOSE: 96 mg/dL (ref 65–99)
POTASSIUM: 3.9 mmol/L (ref 3.5–5.1)
Sodium: 136 mmol/L (ref 135–145)

## 2015-09-22 MED ORDER — CLINDAMYCIN HCL 300 MG PO CAPS: 300.0000 mg | ORAL_CAPSULE | Freq: Three times a day (TID) | ORAL | Status: DC

## 2015-09-22 MED ORDER — PROCHLORPERAZINE MALEATE 5 MG PO TABS: 5.0000 mg | ORAL_TABLET | Freq: Four times a day (QID) | ORAL | Status: DC | PRN

## 2015-09-22 NOTE — Progress Notes (Signed)
Patient discharge via wheel chair

## 2015-09-22 NOTE — Care Management Obs Status (Signed)
MEDICARE OBSERVATION STATUS NOTIFICATION   Patient Details  Name: Casey Hodges MRN: [id number] Date of Birth: 02/16/1976   Medicare Observation Status Notification Given:  Yes    Collie Siadngela Aleane Wesenberg, RN 09/22/2015, 8:12 AM

## 2015-09-22 NOTE — Discharge Summary (Signed)
[facility] - [place] at [facility]   PATIENT NAME: Casey Hodges    MR#:  [id number]  DATE OF BIRTH:  Feb 20, 1976  DATE OF ADMISSION:  09/21/2015 ADMITTING PHYSICIAN: Arnaldo Natal, MD  DATE OF DISCHARGE: No discharge date for patient encounter.  PRIMARY CARE PHYSICIAN: Brayton El, MD    ADMISSION DIAGNOSIS:  Facial cellulitis [L03.211]  DISCHARGE DIAGNOSIS:  Intractable nausea vomiting Facial cellulitis  SECONDARY DIAGNOSIS:   Past Medical History  Diagnosis Date  . Bipolar affective (HCC)     pt reported  . Uterine polyp   . Eating disorder     Under control per patient  . Renal disorder   . Painful menstrual periods   . Abnormal vaginal Pap smear     10+ years ago- no colpo repeat was normal  . Painful intercourse   . GERD (gastroesophageal reflux disease)   . VWD (acquired von Willebrand's disease) (HCC)   . Anxiety   . Spinal stenosis   . Kidney stone   . Bipolar disorder (HCC)   . AR (allergic rhinitis)   . Pelvic pain in female   . Fibromyalgia   . PTSD (post-traumatic stress disorder)     HOSPITAL COURSE:  Casey Hodges  is a 40 y.o. female admitted 09/21/2015 with chief complaint intractable nausea and vomiting. Please see H&P performed by Dr. Sheryle Hail for further information. She was admitted to the hospital 09/16/2015 with facial cellulitis started on IV antibiotics had issues with left AMA on repeat presentation she was unable to tolerate her oral antibiotics given intractable nausea and vomiting. Fortunately her symptoms have markedly improved as well as her mental status. She was evaluated by psychiatry who deemed no further issues. Currently she is improved and stable for discharge, will continue her antibiotic course 2 more days total   DISCHARGE CONDITIONS:   Stable/improved  CONSULTS OBTAINED:     DRUG ALLERGIES:   Allergies  Allergen Reactions  . Wellbutrin [Bupropion Hcl] Other (See Comments)    Reaction:   Suicidal   . Augmentin [Amoxicillin-Pot Clavulanate] Diarrhea, Nausea And Vomiting and Other (See Comments)    Has patient had a PCN reaction causing immediate rash, facial/tongue/throat swelling, SOB or lightheadedness with hypotension: No Has patient had a PCN reaction causing severe rash involving mucus membranes or skin necrosis: No Has patient had a PCN reaction that required hospitalization No Has patient had a PCN reaction occurring within the last 10 years: Yes If all of the above answers are "NO", then may proceed with Cephalosporin use.  Marland Kitchen Penicillins Diarrhea, Nausea And Vomiting and Other (See Comments)    Has patient had a PCN reaction causing immediate rash, facial/tongue/throat swelling, SOB or lightheadedness with hypotension: No Has patient had a PCN reaction causing severe rash involving mucus membranes or skin necrosis: No Has patient had a PCN reaction that required hospitalization No Has patient had a PCN reaction occurring within the last 10 years: Yes If all of the above answers are "NO", then may proceed with Cephalosporin use.  [name]  [Hydrocodone-Ibuprofen] Nausea And Vomiting  . Lasix [Furosemide] Rash  . Nitrofurantoin Monohyd Macro Rash    DISCHARGE MEDICATIONS:   Current Discharge Medication List    START taking these medications   Details  clindamycin (CLEOCIN) 300 MG capsule Take 1 capsule (300 mg total) by mouth 3 (three) times daily. Qty: 6 capsule, Refills: 0    prochlorperazine (COMPAZINE) 5 MG tablet Take 1 tablet (5 mg total)  by mouth every 6 (six) hours as needed for nausea or vomiting. Qty: 30 tablet, Refills: 0      CONTINUE these medications which have NOT CHANGED   Details  Biotin w/ Vitamins C & E (HAIR SKIN & NAILS GUMMIES) 1250-7.5-7.5 MCG-MG-UNT CHEW Chew 1 each by mouth daily.    LYRICA 150 MG capsule Take 1 capsule (150 mg total) by mouth 2 (two) times daily. Qty: 60 capsule, Refills: 0   Associated Diagnoses: Fibromyalgia      Melatonin 10 MG SUBL Place 10 mg under the tongue at bedtime as needed (for sleep).       STOP taking these medications     ibuprofen (ADVIL,MOTRIN) 800 MG tablet      QUEtiapine (SEROQUEL) 100 MG tablet          DISCHARGE INSTRUCTIONS:    DIET:  Regular diet  DISCHARGE CONDITION:  Good  ACTIVITY:  Activity as tolerated  OXYGEN:  Home Oxygen: No.   Oxygen Delivery: room air  DISCHARGE LOCATION:  home   If you experience worsening of your admission symptoms, develop shortness of breath, life threatening emergency, suicidal or homicidal thoughts you must seek medical attention immediately by calling 911 or calling your MD immediately  if symptoms less severe.  You Must read complete instructions/literature along with all the possible adverse reactions/side effects for all the Medicines you take and that have been prescribed to you. Take any new Medicines after you have completely understood and accpet all the possible adverse reactions/side effects.   Please note  You were cared for by a hospitalist during your hospital stay. If you have any questions about your discharge medications or the care you received while you were in the hospital after you are discharged, you can call the unit and asked to speak with the hospitalist on call if the hospitalist that took care of you is not available. Once you are discharged, your primary care physician will handle any further medical issues. Please note that NO REFILLS for any discharge medications will be authorized once you are discharged, as it is imperative that you return to your primary care physician (or establish a relationship with a primary care physician if you do not have one) for your aftercare needs so that they can reassess your need for medications and monitor your lab values.    On the day of Discharge:   VITAL SIGNS:  Blood pressure 114/69, pulse 88, temperature 98.6 F (37 C), temperature source Oral, resp.  rate 18, height 5\' 2"  (1.575 m), weight 72.576 kg (160 lb), last menstrual period 09/16/2015, SpO2 98 %.  I/O:   Intake/Output Summary (Last 24 hours) at 09/22/15 1027 Last data filed at 09/22/15 [id number]  Gross per 24 hour  Intake 3338.33 ml  Output    200 ml  Net 3138.33 ml    PHYSICAL EXAMINATION:  GENERAL:  [age] y.o.-year-old patient lying in the bed with no acute distress.  EYES: Pupils equal, round, reactive to light and accommodation. No scleral icterus. Extraocular muscles intact.  HEENT: Head atraumatic, normocephalic. Oropharynx and nasopharynx clear.  NECK:  Supple, no jugular venous distention. No thyroid enlargement, no tenderness.  LUNGS: Normal breath sounds bilaterally, no wheezing, rales,rhonchi or crepitation. No use of accessory muscles of respiration.  CARDIOVASCULAR: S1, S2 normal. No murmurs, rubs, or gallops.  ABDOMEN: Soft, non-tender, non-distended. Bowel sounds present. No organomegaly or mass.  EXTREMITIES: No pedal edema, cyanosis, or clubbing.  NEUROLOGIC: Cranial nerves II through XII are  intact. Muscle strength 5/5 in all extremities. Sensation intact. Gait not checked.  PSYCHIATRIC: The patient is alert and oriented x 3.  SKIN: Small area on left forearm abrasion with decreasing erythema, otherwise No obvious rash, lesion, or ulcer.   DATA REVIEW:   CBC  Recent Labs Lab 09/21/15 0605  WBC 8.1  HGB 15.0  HCT 43.4  PLT 231    Chemistries   Recent Labs Lab 09/21/15 0605 09/22/15 0552  NA 135 136  K 3.1* 3.9  CL 106 114*  CO2 20* 19*  GLUCOSE 125* 96  BUN 10 8  CREATININE 0.77 0.59  CALCIUM 9.5 8.4*  AST 32  --   ALT 91*  --   ALKPHOS 99  --   BILITOT 0.6  --     Cardiac Enzymes  Recent Labs Lab 09/20/15 0917  TROPONINI <0.03    Microbiology Results  Results for orders placed or performed during the hospital encounter of 09/16/15  Culture, blood (routine x 2)     Status: None   Collection Time: 09/16/15 11:19 PM  Result Value  Ref Range Status   Specimen Description BLOOD RIGHT WRIST  Final   Special Requests BOTTLES DRAWN AEROBIC AND ANAEROBIC 6CCAERO,6CCANA  Final   Culture NO GROWTH 5 DAYS  Final   Report Status 09/21/2015 FINAL  Final  Culture, blood (routine x 2)     Status: None   Collection Time: 09/16/15 11:19 PM  Result Value Ref Range Status   Specimen Description BLOOD LEFT ASSIST CONTROL  Final   Special Requests   Final    BOTTLES DRAWN AEROBIC AND ANAEROBIC 8CCAERO,10CCANA   Culture NO GROWTH 5 DAYS  Final   Report Status 09/21/2015 FINAL  Final    RADIOLOGY:  No results found.   Management plans discussed with the patient, family and they are in agreement.  CODE STATUS:     Code Status Orders        Start     Ordered   09/21/15 0817  Full code   Continuous     09/21/15 0817    Code Status History    Date Active Date Inactive Code Status Order ID Comments User Context   09/16/2015 10:28 PM 09/19/2015  2:46 AM Full Code [id number]  Oralia Manis, MD Inpatient      TOTAL TIME TAKING CARE OF THIS PATIENT: 28 minutes.    Hower,  Mardi Mainland.D on 09/22/2015 at 10:27 AM  Between 7am to 6pm - Pager - [phone]  After 6pm go to [email or web address] - password [email or web address] [facility]  [place] [facility]  Office  [phone]  CC: Primary care physician; Brayton El, MD

## 2015-09-22 NOTE — Care Management (Signed)
Delivered OBS letter from Medicare. Patient was sitting independently in bed and able to ambulate to bathroom without assistance. She denies RNCM needs.

## 2015-09-22 NOTE — Progress Notes (Signed)
Order to discharge patient to home today. Mom to come pick up patient. Discharge instructions given per MD order, home and new medications. Rx.slip's given. Patient voiced understanding of discharge instructions given.

## 2015-09-28 DIAGNOSIS — F4310 Post-traumatic stress disorder, unspecified: Secondary | ICD-10-CM | POA: Diagnosis not present

## 2015-10-04 DIAGNOSIS — Z79899 Other long term (current) drug therapy: Secondary | ICD-10-CM | POA: Diagnosis not present

## 2015-10-04 DIAGNOSIS — F4310 Post-traumatic stress disorder, unspecified: Secondary | ICD-10-CM | POA: Diagnosis not present

## 2015-10-04 DIAGNOSIS — F3132 Bipolar disorder, current episode depressed, moderate: Secondary | ICD-10-CM | POA: Diagnosis not present

## 2015-10-04 DIAGNOSIS — F319 Bipolar disorder, unspecified: Secondary | ICD-10-CM | POA: Diagnosis not present

## 2015-10-05 ENCOUNTER — Ambulatory Visit (INDEPENDENT_AMBULATORY_CARE_PROVIDER_SITE_OTHER): Payer: Medicare Other | Admitting: Family Medicine

## 2015-10-05 ENCOUNTER — Encounter: Payer: Self-pay | Admitting: Family Medicine

## 2015-10-05 VITALS — BP 108/62 | HR 112 | Temp 99.1°F | Resp 19 | Ht 62.0 in | Wt 156.7 lb

## 2015-10-05 DIAGNOSIS — F411 Generalized anxiety disorder: Secondary | ICD-10-CM | POA: Diagnosis not present

## 2015-10-05 DIAGNOSIS — R112 Nausea with vomiting, unspecified: Secondary | ICD-10-CM | POA: Diagnosis not present

## 2015-10-05 DIAGNOSIS — M797 Fibromyalgia: Secondary | ICD-10-CM | POA: Diagnosis not present

## 2015-10-05 DIAGNOSIS — T50905A Adverse effect of unspecified drugs, medicaments and biological substances, initial encounter: Secondary | ICD-10-CM

## 2015-10-05 MED ORDER — OXYCODONE-ACETAMINOPHEN 7.5-325 MG PO TABS: 1.0000 | ORAL_TABLET | Freq: Three times a day (TID) | ORAL | Status: DC | PRN

## 2015-10-05 MED ORDER — LYRICA 150 MG PO CAPS: 150.0000 mg | ORAL_CAPSULE | Freq: Three times a day (TID) | ORAL | Status: DC

## 2015-10-05 MED ORDER — ALPRAZOLAM 0.5 MG PO TABS: 0.5000 mg | ORAL_TABLET | Freq: Three times a day (TID) | ORAL | Status: DC | PRN

## 2015-10-05 MED ORDER — PROMETHAZINE HCL 25 MG PO TABS: 25.0000 mg | ORAL_TABLET | Freq: Every day | ORAL | Status: DC | PRN

## 2015-10-05 NOTE — Progress Notes (Signed)
Name: Casey Hodges   MRN: [id number]    DOB: 1975/11/29   Date:10/05/2015       Progress Note  Subjective  Chief Complaint  Chief Complaint  Patient presents with  . Follow-up    1 mo  . Medication Refill    HPI  Anxiety: Symptoms include worrying, nervous, sweaty palms, and feeling uneasy. Takes Alprazolam 0.5mg  three times daily as needed. Helps relieve her anxiety, recently worse with hospital admission for cellulitis.  Fibromyalgia: Generalized musculoskeletal aches and pains, most prominent in knees, lower back, hips, and neck. She takes Lyrica and Percocet which helps relieve her pain to some extent. Overall pain is rated at 5/10 today. No side effects reported from Lyrica but requesting to increase to three times daily to be able to enjoy the activities planned this summer with her daughter which require long car trips and walking.   Opioid-Induced Nausea: Nausea mostly from Percocet plus anxiety. No vomiting. Takes Promethazine 25 gm once daily as needed for nausea. Requesting refill.  Hospital Follow up: Patient was admitted to [facility] for cellulitis of the face, was treated with IV antibiotics and discharged on PO antibiotics. During the hospital stay, pt. was evaluated by Psychiatry for hallucinations and agitations. ER and inpatient records reviewed and discussed with patient in detail. Past Medical History  Diagnosis Date  . Bipolar affective (HCC)     pt reported  . Uterine polyp   . Eating disorder     Under control per patient  . Renal disorder   . Painful menstrual periods   . Abnormal vaginal Pap smear     10+ years ago- no colpo repeat was normal  . Painful intercourse   . GERD (gastroesophageal reflux disease)   . VWD (acquired von Willebrand's disease) (HCC)   . Anxiety   . Spinal stenosis   . Kidney stone   . Bipolar disorder (HCC)   . AR (allergic rhinitis)   . Pelvic pain in female   . Fibromyalgia   . PTSD (post-traumatic  stress disorder)     Past Surgical History  Procedure Laterality Date  . Tubal ligation    . Laparoscopy abdomen diagnostic    . Hysteroscopy      removed polyps  . Laparoscopic vaginal hysterectomy  2015    at [facility]  . Abdominal hysterectomy    . Laparoscopy Left 01/10/2015    Procedure: LAPAROSCOPY OPERATIVE with biopsy, left oopherectomy;  Surgeon: Herold Harms, MD;  Location: [facility] ORS;  Service: Gynecology;  Laterality: Left;    Family History  Problem Relation Age of Onset  . Breast cancer Paternal Grandmother   . Diabetes Maternal Grandmother   . Hypertension Father   . Sleep apnea Mother   . Diabetes Maternal Aunt   . Diabetes Maternal Uncle     Social History   Social History  . Marital Status: Widowed    Spouse Name: N/A  . Number of Children: 1  . Years of Education: N/A   Occupational History  . Un-employed due to bipolar    Social History Main Topics  . Smoking status: Current Every Day Smoker -- 0.50 packs/day    Types: Cigarettes    Start date: 05/18/1995  . Smokeless tobacco: Never Used  . Alcohol Use: No     Comment: Socially  . Drug Use: No  . Sexual Activity: No   Other Topics Concern  . Not on file   Social History Narrative  Regular Exercise -  NO   Daily Caffeine Use:  1 cup coffee in am      1 child, one step child           Current outpatient prescriptions:  .  Biotin w/ Vitamins C & E (HAIR SKIN & NAILS GUMMIES) 1250-7.5-7.5 MCG-MG-UNT CHEW, Chew 1 each by mouth daily., Disp: , Rfl:  .  LYRICA 150 MG capsule, Take 1 capsule (150 mg total) by mouth 2 (two) times daily. (Patient taking differently: Take 150 mg by mouth 2 (two) times daily as needed. ), Disp: 60 capsule, Rfl: 0 .  Melatonin 10 MG SUBL, Place 10 mg under the tongue at bedtime as needed (for sleep). , Disp: , Rfl:  .  oxyCODONE-acetaminophen (PERCOCET) 7.5-325 MG tablet, take 1 tablet by mouth every 8 hours if needed for SEVERE PAIN, Disp: , Rfl: 0 .   QUEtiapine (SEROQUEL) 300 MG tablet, , Disp: , Rfl:   Allergies  Allergen Reactions  . Wellbutrin [Bupropion Hcl] Other (See Comments)    Reaction:  Suicidal   . Augmentin [Amoxicillin-Pot Clavulanate] Diarrhea, Nausea And Vomiting and Other (See Comments)    Has patient had a PCN reaction causing immediate rash, facial/tongue/throat swelling, SOB or lightheadedness with hypotension: No Has patient had a PCN reaction causing severe rash involving mucus membranes or skin necrosis: No Has patient had a PCN reaction that required hospitalization No Has patient had a PCN reaction occurring within the last 10 years: Yes If all of the above answers are "NO", then may proceed with Cephalosporin use.  Marland Kitchen Penicillins Diarrhea, Nausea And Vomiting and Other (See Comments)    Has patient had a PCN reaction causing immediate rash, facial/tongue/throat swelling, SOB or lightheadedness with hypotension: No Has patient had a PCN reaction causing severe rash involving mucus membranes or skin necrosis: No Has patient had a PCN reaction that required hospitalization No Has patient had a PCN reaction occurring within the last 10 years: Yes If all of the above answers are "NO", then may proceed with Cephalosporin use.  [name]  [Hydrocodone-Ibuprofen] Nausea And Vomiting  . Lasix [Furosemide] Rash  . Nitrofurantoin Monohyd Macro Rash     Review of Systems  Constitutional: Negative for fever and chills.  Musculoskeletal: Positive for myalgias, back pain and joint pain.  Psychiatric/Behavioral: Positive for depression. The patient is nervous/anxious.     Objective  Filed Vitals:   10/05/15 1608  BP: 108/62  Pulse: 112  Temp: 99.1 F (37.3 C)  TempSrc: Oral  Resp: 19  Height:  (1.575 m)  Weight: 156 lb 11.2 oz (71.079 kg)  SpO2: 98%    Physical Exam  Constitutional: She is oriented to person, place, and time and well-developed, well-nourished, and in no distress.  HENT:  Head:  Normocephalic and atraumatic.  Cardiovascular: Normal rate and regular rhythm.   Pulmonary/Chest: Effort normal and breath sounds normal.  Musculoskeletal:       Right knee: Tenderness found.       Left knee: Tenderness found.       Back:       Legs: Neurological: She is alert and oriented to person, place, and time.  Psychiatric: Mood, memory, affect and judgment normal.  Nursing note and vitals reviewed.      Assessment & Plan  1. Fibromyalgia We will increase Lyrica to 3 times daily for relief of pain associated with fibromyalgia - LYRICA 150 MG capsule; Take 1 capsule (150 mg total) by mouth 3 (  three) times daily.  Dispense: 90 capsule; Refill: 0 - oxyCODONE-acetaminophen (PERCOCET) 7.5-325 MG tablet; Take 1 tablet by mouth every 8 (eight) hours as needed for severe pain.  Dispense: 90 tablet; Refill: 0  2. Drug-induced nausea and vomiting  - promethazine (PHENERGAN) 25 MG tablet; Take 1 tablet (25 mg total) by mouth daily as needed for nausea or vomiting.  Dispense: 30 tablet; Refill: 0  3. Generalized anxiety disorder Stable, refills for alprazolam provided. - ALPRAZolam (XANAX) 0.5 MG tablet; Take 1 tablet (0.5 mg total) by mouth 3 (three) times daily as needed for anxiety.  Dispense: 90 tablet; Refill: 0  Cellulitis on the face has resolved, patient has completed antibiotic therapy. Audelia Knape Asad A. Faylene KurtzShah [facility] 10/05/2015 4:45 PM

## 2015-10-19 DIAGNOSIS — F4310 Post-traumatic stress disorder, unspecified: Secondary | ICD-10-CM | POA: Diagnosis not present

## 2015-11-01 DIAGNOSIS — Z79899 Other long term (current) drug therapy: Secondary | ICD-10-CM | POA: Diagnosis not present

## 2015-11-01 DIAGNOSIS — F319 Bipolar disorder, unspecified: Secondary | ICD-10-CM | POA: Diagnosis not present

## 2015-11-01 DIAGNOSIS — F3132 Bipolar disorder, current episode depressed, moderate: Secondary | ICD-10-CM | POA: Diagnosis not present

## 2015-11-01 DIAGNOSIS — F4310 Post-traumatic stress disorder, unspecified: Secondary | ICD-10-CM | POA: Diagnosis not present

## 2015-11-02 DIAGNOSIS — F4310 Post-traumatic stress disorder, unspecified: Secondary | ICD-10-CM | POA: Diagnosis not present

## 2015-11-04 ENCOUNTER — Other Ambulatory Visit: Payer: Self-pay | Admitting: Emergency Medicine

## 2015-11-04 ENCOUNTER — Encounter: Payer: Self-pay | Admitting: Family Medicine

## 2015-11-04 ENCOUNTER — Ambulatory Visit (INDEPENDENT_AMBULATORY_CARE_PROVIDER_SITE_OTHER): Payer: Medicare Other | Admitting: Family Medicine

## 2015-11-04 VITALS — BP 110/70 | HR 110 | Temp 98.0°F | Resp 16 | Ht 62.0 in | Wt 160.1 lb

## 2015-11-04 DIAGNOSIS — T50905A Adverse effect of unspecified drugs, medicaments and biological substances, initial encounter: Secondary | ICD-10-CM

## 2015-11-04 DIAGNOSIS — F411 Generalized anxiety disorder: Secondary | ICD-10-CM

## 2015-11-04 DIAGNOSIS — T402X5A Adverse effect of other opioids, initial encounter: Secondary | ICD-10-CM | POA: Diagnosis not present

## 2015-11-04 DIAGNOSIS — K5903 Drug induced constipation: Secondary | ICD-10-CM

## 2015-11-04 DIAGNOSIS — M797 Fibromyalgia: Secondary | ICD-10-CM

## 2015-11-04 DIAGNOSIS — R112 Nausea with vomiting, unspecified: Secondary | ICD-10-CM | POA: Diagnosis not present

## 2015-11-04 MED ORDER — METHYLNALTREXONE BROMIDE 150 MG PO TABS: 450.0000 mg | ORAL_TABLET | Freq: Every morning | ORAL | Status: DC

## 2015-11-04 MED ORDER — PROMETHAZINE HCL 25 MG PO TABS: 25.0000 mg | ORAL_TABLET | Freq: Every day | ORAL | Status: DC | PRN

## 2015-11-04 MED ORDER — ALPRAZOLAM 0.5 MG PO TABS: 0.5000 mg | ORAL_TABLET | Freq: Three times a day (TID) | ORAL | Status: DC | PRN

## 2015-11-04 MED ORDER — OXYCODONE-ACETAMINOPHEN 7.5-325 MG PO TABS: 1.0000 | ORAL_TABLET | Freq: Three times a day (TID) | ORAL | Status: DC | PRN

## 2015-11-04 MED ORDER — LYRICA 150 MG PO CAPS: 150.0000 mg | ORAL_CAPSULE | Freq: Three times a day (TID) | ORAL | Status: DC

## 2015-11-04 NOTE — Progress Notes (Signed)
Name: Casey Hodges   MRN: [id number]    DOB: [birth year]   Date:11/04/2015       Progress Note  Subjective  Chief Complaint  Chief Complaint  Patient presents with  . Pain     1 month follow up medication refills  . Anxiety    HPI  Anxiety: Worse this week as it was her husband's birthday, has been sweating unusually, maybe from anxiety. SHe takes Alprazolam 0.5 mg three times daily as needed, which generally does help relieve her anxiety.  Fibromyalgia: Pt. Presents for medication refills on Percocet and Lyrica. Her chronic low back pain is worse this week due to damp and cold weather. Overall pain is rated at 8/10 todayPercocet helps relieve her pain but makes her constipated, which is very distressing for her, requesting medication to help relieve constipation induced by opioid therapy.  Opioid-induced Nausea: Nausea related to Percocet, takes Promethazine for relief.    Past Medical History  Diagnosis Date  . Bipolar affective (HCC)     pt reported  . Uterine polyp   . Eating disorder     Under control per patient  . Renal disorder   . Painful menstrual periods   . Abnormal vaginal Pap smear     10+ years ago- no colpo repeat was normal  . Painful intercourse   . GERD (gastroesophageal reflux disease)   . VWD (acquired von Willebrand's disease) (HCC)   . Anxiety   . Spinal stenosis   . Kidney stone   . Bipolar disorder (HCC)   . AR (allergic rhinitis)   . Pelvic pain in female   . Fibromyalgia   . PTSD (post-traumatic stress disorder)     Past Surgical History  Procedure Laterality Date  . Tubal ligation    . Laparoscopy abdomen diagnostic    . Hysteroscopy      removed polyps  . Laparoscopic vaginal hysterectomy  2015    at [facility]  . Abdominal hysterectomy    . Laparoscopy Left 01/10/2015    Procedure: LAPAROSCOPY OPERATIVE with biopsy, left oopherectomy;  Surgeon: Herold Harms, MD;  Location: [facility] ORS;  Service: Gynecology;  Laterality: Left;     Family History  Problem Relation Age of Onset  . Breast cancer Paternal Grandmother   . Diabetes Maternal Grandmother   . Hypertension Father   . Sleep apnea Mother   . Diabetes Maternal Aunt   . Diabetes Maternal Uncle     Social History   Social History  . Marital Status: Widowed    Spouse Name: N/A  . Number of Children: 1  . Years of Education: N/A   Occupational History  . Un-employed due to bipolar    Social History Main Topics  . Smoking status: Current Every Day Smoker -- 0.50 packs/day    Types: Cigarettes    Start date: 05/18/1995  . Smokeless tobacco: Never Used  . Alcohol Use: No     Comment: Socially  . Drug Use: No  . Sexual Activity: No   Other Topics Concern  . Not on file   Social History Narrative   Regular Exercise -  NO   Daily Caffeine Use:  1 cup coffee in am      1 child, one step child           Current outpatient prescriptions:  .  ALPRAZolam (XANAX) 0.5 MG tablet, Take 1 tablet (0.5 mg total) by mouth 3 (three) times daily as needed for anxiety., Disp:  90 tablet, Rfl: 0 .  Biotin w/ Vitamins C & E (HAIR SKIN & NAILS GUMMIES) 1250-7.5-7.5 MCG-MG-UNT CHEW, Chew 1 each by mouth daily., Disp: , Rfl:  .  LYRICA 150 MG capsule, Take 1 capsule (150 mg total) by mouth 3 (three) times daily., Disp: 90 capsule, Rfl: 0 .  Melatonin 10 MG SUBL, Place 10 mg under the tongue at bedtime as needed (for sleep). , Disp: , Rfl:  .  oxyCODONE-acetaminophen (PERCOCET) 7.5-325 MG tablet, Take 1 tablet by mouth every 8 (eight) hours as needed for severe pain., Disp: 90 tablet, Rfl: 0 .  promethazine (PHENERGAN) 25 MG tablet, Take 1 tablet (25 mg total) by mouth daily as needed for nausea or vomiting., Disp: 30 tablet, Rfl: 0 .  QUEtiapine (SEROQUEL) 300 MG tablet, , Disp: , Rfl:   Allergies  Allergen Reactions  . Wellbutrin [Bupropion Hcl] Other (See Comments)    Reaction:  Suicidal   . Augmentin [Amoxicillin-Pot Clavulanate] Diarrhea, Nausea And  Vomiting and Other (See Comments)    Has patient had a PCN reaction causing immediate rash, facial/tongue/throat swelling, SOB or lightheadedness with hypotension: No Has patient had a PCN reaction causing severe rash involving mucus membranes or skin necrosis: No Has patient had a PCN reaction that required hospitalization No Has patient had a PCN reaction occurring within the last 10 years: Yes If all of the above answers are "NO", then may proceed with Cephalosporin use.  Marland Kitchen. Penicillins Diarrhea, Nausea And Vomiting and Other (See Comments)    Has patient had a PCN reaction causing immediate rash, facial/tongue/throat swelling, SOB or lightheadedness with hypotension: No Has patient had a PCN reaction causing severe rash involving mucus membranes or skin necrosis: No Has patient had a PCN reaction that required hospitalization No Has patient had a PCN reaction occurring within the last 10 years: Yes If all of the above answers are "NO", then may proceed with Cephalosporin use.  [name]. Vicoprofen  [Hydrocodone-Ibuprofen] Nausea And Vomiting  . Lasix [Furosemide] Rash  . Nitrofurantoin Monohyd Macro Rash     Review of Systems  Constitutional: Negative for fever, chills and weight loss.  Gastrointestinal: Positive for nausea and constipation. Negative for vomiting, abdominal pain and diarrhea.  Musculoskeletal: Positive for back pain and joint pain.  Psychiatric/Behavioral: Positive for depression. The patient is nervous/anxious and has insomnia.     Objective  Filed Vitals:   11/04/15 0926  BP: 110/70  Pulse: 110  Temp: 98 F (36.7 C)  TempSrc: Oral  Resp: 16  Height: 5\' 2"  (1.575 m)  Weight: 160 lb 1.6 oz (72.621 kg)  SpO2: 99%    Physical Exam  Constitutional: She is oriented to person, place, and time and well-developed, well-nourished, and in no distress.  HENT:  Head: Normocephalic and atraumatic.  Cardiovascular: Normal rate and regular rhythm.   Pulmonary/Chest: Effort  normal and breath sounds normal.  Abdominal: Soft. Bowel sounds are normal. There is tenderness.  Musculoskeletal:       Right knee: Tenderness found.       Left knee: Tenderness found.       Cervical back: She exhibits tenderness and pain.       Back:       Legs: Neurological: She is alert and oriented to person, place, and time.  Psychiatric: Mood, memory, affect and judgment normal.  Nursing note and vitals reviewed.    Assessment & Plan  1. Fibromyalgia Stable, variation in pain levels is normal, continue on Lyrica and Percocet  as prescribed - LYRICA 150 MG capsule; Take 1 capsule (150 mg total) by mouth 3 (three) times daily.  Dispense: 90 capsule; Refill: 0 - oxyCODONE-acetaminophen (PERCOCET) 7.5-325 MG tablet; Take 1 tablet by mouth every 8 (eight) hours as needed for severe pain.  Dispense: 90 tablet; Refill: 0  2. Generalized anxiety disorder Recently worse, followed by psychiatry as well. Continue on alprazolam for now. - ALPRAZolam (XANAX) 0.5 MG tablet; Take 1 tablet (0.5 mg total) by mouth 3 (three) times daily as needed for anxiety.  Dispense: 90 tablet; Refill: 0  3. Drug-induced nausea and vomiting  - promethazine (PHENERGAN) 25 MG tablet; Take 1 tablet (25 mg total) by mouth daily as needed for nausea or vomiting.  Dispense: 30 tablet; Refill: 0  4. Constipation due to opioid therapy We'll start on Relistor to relieve constipation, explained that there is a chance of symptoms from of opioid withdrawal because of MOA of the drug. Advised to stop the medication immediately if symptoms of opioid withdrawal develop and contact us right away. Patient verbalized agreement. Follow-up in one month - Methylnaltrexone Bromide (RELISTOR) 150 MG TABS; Take 450 mg by mouth every morning.  Dispense: 90 tablet; Refill: 0   Priyansh Pry Asad A. Faylene Kurtz [facility] 11/04/2015 9:32 AM

## 2015-11-13 DIAGNOSIS — M5431 Sciatica, right side: Secondary | ICD-10-CM | POA: Diagnosis not present

## 2015-11-13 DIAGNOSIS — L03811 Cellulitis of head [any part, except face]: Secondary | ICD-10-CM | POA: Diagnosis not present

## 2015-11-16 ENCOUNTER — Ambulatory Visit (INDEPENDENT_AMBULATORY_CARE_PROVIDER_SITE_OTHER): Payer: Medicare Other | Admitting: Family Medicine

## 2015-11-16 ENCOUNTER — Encounter: Payer: Self-pay | Admitting: Family Medicine

## 2015-11-16 VITALS — BP 120/80 | HR 100 | Temp 98.7°F | Resp 16 | Ht 62.0 in | Wt 160.6 lb

## 2015-11-16 DIAGNOSIS — L03811 Cellulitis of head [any part, except face]: Secondary | ICD-10-CM

## 2015-11-16 NOTE — Progress Notes (Signed)
Name: Casey Hodges   MRN: [id number]    DOB: 1976-03-01   Date:11/16/2015       Progress Note  Subjective  Chief Complaint  Chief Complaint  Patient presents with  . Recurrent Skin Infections    in scalp of head. Would like referral     HPI  Infection of the Scalp: Patient was seen at [facility] on + years ago- no colpo repeat was normal  . Painful intercourse   . GERD (gastroesophageal reflux disease)   . VWD (acquired von Willebrand's disease) (HCC)   . Anxiety   . Spinal stenosis   . Kidney stone   . Bipolar disorder (HCC)   . AR (allergic rhinitis)   . Pelvic pain in female   . Fibromyalgia   . PTSD (post-traumatic stress disorder)     Past Surgical History  Procedure Laterality Date  . Tubal ligation    . Laparoscopy abdomen diagnostic    . Hysteroscopy      removed polyps  . Laparoscopic vaginal hysterectomy  2015    at [facility]  . Abdominal hysterectomy    . Laparoscopy Left 01/10/2015    Procedure: LAPAROSCOPY OPERATIVE with biopsy, left oopherectomy;  Surgeon: Herold Harms, MD;  Location: [facility] ORS;  Service: Gynecology;  Laterality: Left;    Family History  Problem Relation Age of Onset  . Breast cancer Paternal Grandmother   . Diabetes Maternal Grandmother   . Hypertension Father   . Sleep apnea Mother   . Diabetes Maternal Aunt   . Diabetes Maternal Uncle     Social History   Social History   . Marital Status: Widowed    Spouse Name: N/A  . Number of Children: 1  . Years of Education: N/A   Occupational History  . Un-employed due to bipolar    Social History Main Topics  . Smoking status: Current Every Day Smoker -- 0.50 packs/day    Types: Cigarettes    Start date: 05/18/1995  . Smokeless tobacco: Never Used  . Alcohol Use: No     Comment: Socially  . Drug Use: No  . Sexual Activity: No   Other Topics Concern  . Not on file   Social History Narrative   Regular Exercise -  NO   Daily Caffeine Use:  1 cup coffee in am      1 child, one step child           Current outpatient prescriptions:  .  ALPRAZolam (XANAX) 0.5 MG tablet, Take 1 tablet (0.5 mg total) by mouth 3 (three) times daily as needed for anxiety., Disp: 90 tablet, Rfl: 0 .  Biotin w/ Vitamins C & E (HAIR SKIN & NAILS GUMMIES) 1250-7.5-7.5 MCG-MG-UNT CHEW, Chew 1 each by mouth daily., Disp: , Rfl:  .  LYRICA 150 MG capsule, Take 1 capsule (150 mg  total) by mouth 3 (three) times daily., Disp: 90 capsule, Rfl: 0 .  Melatonin 10 MG SUBL, Place 10 mg under the tongue at bedtime as needed (for sleep). , Disp: , Rfl:  .  Methylnaltrexone Bromide (RELISTOR) 150 MG TABS, Take 450 mg by mouth every morning., Disp: 90 tablet, Rfl: 0 .  oxyCODONE-acetaminophen (PERCOCET) 7.5-325 MG tablet, Take 1 tablet by mouth every 8 (eight) hours as needed for severe pain., Disp: 90 tablet, Rfl: 0 .  promethazine (PHENERGAN) 25 MG tablet, Take 1 tablet (25 mg total) by mouth daily as needed for nausea or vomiting., Disp: 30 tablet, Rfl: 0 .  QUEtiapine (SEROQUEL) 300 MG tablet, , Disp: , Rfl:   Allergies  Allergen Reactions  . Wellbutrin [Bupropion Hcl] Other (See Comments)    Reaction:  Suicidal   . Augmentin [Amoxicillin-Pot Clavulanate] Diarrhea, Nausea And Vomiting and Other (See Comments)    Has patient had a PCN reaction causing immediate rash, facial/tongue/throat swelling, SOB or lightheadedness with hypotension:  No Has patient had a PCN reaction causing severe rash involving mucus membranes or skin necrosis: No Has patient had a PCN reaction that required hospitalization No Has patient had a PCN reaction occurring within the last 10 years: Yes If all of the above answers are "NO", then may proceed with Cephalosporin use.  Marland Kitchen. Penicillins Diarrhea, Nausea And Vomiting and Other (See Comments)    Has patient had a PCN reaction causing immediate rash, facial/tongue/throat swelling, SOB or lightheadedness with hypotension: No Has patient had a PCN reaction causing severe rash involving mucus membranes or skin necrosis: No Has patient had a PCN reaction that required hospitalization No Has patient had a PCN reaction occurring within the last 10 years: Yes If all of the above answers are "NO", then may proceed with Cephalosporin use.  [name]. Vicoprofen  [Hydrocodone-Ibuprofen] Nausea And Vomiting  . Lasix [Furosemide] Rash  . Nitrofurantoin Monohyd Macro Rash     Review of Systems  Constitutional: Negative for fever and chills.  Skin: Positive for rash.    Objective  Filed Vitals:   11/16/15 1142  BP: 120/80  Pulse: 100  Temp: 98.7 F (37.1 C)  TempSrc: Oral  Resp: 16  Height: 5\' 2"  (1.575 m)  Weight: 160 lb 9.6 oz (72.848 kg)  SpO2: 98%    Physical Exam  Constitutional: She is well-developed, well-nourished, and in no distress.  Skin: Lesion noted.  Macular, erythematous, intensely tender area on her scalp, scant yellowish drainage visible. Intense tenderness to palpation, underlying abscess cannot be ruled out.  Nursing note and vitals reviewed.     Assessment & Plan  1. Cellulitis of head or scalp On appropriate antibiotic coverage for cellulitis, referral to dermatology as per patient's request. - Ambulatory referral to Dermatology   [facility] Asad A. Faylene KurtzShah [facility] 11/16/2015 11:59 AM

## 2015-11-24 DIAGNOSIS — F4310 Post-traumatic stress disorder, unspecified: Secondary | ICD-10-CM | POA: Diagnosis not present

## 2015-11-29 DIAGNOSIS — F4310 Post-traumatic stress disorder, unspecified: Secondary | ICD-10-CM | POA: Diagnosis not present

## 2015-11-29 DIAGNOSIS — F319 Bipolar disorder, unspecified: Secondary | ICD-10-CM | POA: Diagnosis not present

## 2015-11-29 DIAGNOSIS — F3132 Bipolar disorder, current episode depressed, moderate: Secondary | ICD-10-CM | POA: Diagnosis not present

## 2015-11-29 DIAGNOSIS — Z79899 Other long term (current) drug therapy: Secondary | ICD-10-CM | POA: Diagnosis not present

## 2015-12-06 ENCOUNTER — Ambulatory Visit: Payer: Self-pay | Admitting: Family Medicine

## 2015-12-07 ENCOUNTER — Ambulatory Visit
Admission: RE | Admit: 2015-12-07 | Discharge: 2015-12-07 | Disposition: A | Discharge status: Home / Self Care | Payer: Medicare Other | Source: Ambulatory Visit | Attending: Family Medicine | Admitting: Family Medicine

## 2015-12-07 ENCOUNTER — Ambulatory Visit (INDEPENDENT_AMBULATORY_CARE_PROVIDER_SITE_OTHER): Payer: Medicare Other | Admitting: Family Medicine

## 2015-12-07 ENCOUNTER — Encounter: Payer: Self-pay | Admitting: Family Medicine

## 2015-12-07 VITALS — BP 122/71 | HR 127 | Temp 98.7°F | Resp 19 | Ht 62.0 in | Wt 169.0 lb

## 2015-12-07 DIAGNOSIS — M25552 Pain in left hip: Secondary | ICD-10-CM

## 2015-12-07 DIAGNOSIS — F411 Generalized anxiety disorder: Secondary | ICD-10-CM | POA: Diagnosis not present

## 2015-12-07 DIAGNOSIS — M1612 Unilateral primary osteoarthritis, left hip: Secondary | ICD-10-CM | POA: Diagnosis not present

## 2015-12-07 DIAGNOSIS — M797 Fibromyalgia: Secondary | ICD-10-CM | POA: Diagnosis not present

## 2015-12-07 MED ORDER — ALPRAZOLAM 0.5 MG PO TABS: 0.5000 mg | ORAL_TABLET | Freq: Three times a day (TID) | ORAL | Status: DC | PRN

## 2015-12-07 MED ORDER — ALPRAZOLAM 0.5 MG PO TABS
0.5000 mg | ORAL_TABLET | Freq: Three times a day (TID) | ORAL | Status: DC | PRN
Start: 2015-12-07 — End: 2015-12-07

## 2015-12-07 MED ORDER — OXYCODONE-ACETAMINOPHEN 7.5-325 MG PO TABS: 1.0000 | ORAL_TABLET | Freq: Three times a day (TID) | ORAL | Status: DC | PRN

## 2015-12-07 MED ORDER — LYRICA 150 MG PO CAPS: 150.0000 mg | ORAL_CAPSULE | Freq: Three times a day (TID) | ORAL | Status: DC

## 2015-12-07 NOTE — Progress Notes (Signed)
Name: Casey Hodges   MRN: [id number]    DOB: 09/08/1975   Date:12/07/2015       Progress Note  Subjective  Chief Complaint  Chief Complaint  Patient presents with  . Follow-up    1 mo  . Medication Refill    Anxiety Presents for follow-up visit. The problem has been gradually worsening (Worse since she had cellulitis and developed a blad spot in her head at the affected aream, which has affected her emotionally.). Symptoms include excessive worry, insomnia, irritability and nervous/anxious behavior. The severity of symptoms is moderate and causing significant distress. The quality of sleep is poor.   Past treatments include benzodiazephines. Compliance with prior treatments has been good.    Fibromyalgia: Pt. Returns for follow up of Fibromyalgia, worse recently mainly from the increased stress and anxiety. She has been experiencing pain in her left hip. Otherwise, she has pain in her neck, shoulders, lower back, and arms. Recently, she has noticed that her feet start to hurt when she walks for over 30 minutes. Patient takes Oxycodone and Lyrica for symptoms of Fibromyalgia.     Past Medical History  Diagnosis Date  . Bipolar affective (HCC)     pt reported  . Uterine polyp   . Eating disorder     Under control per patient  . Renal disorder   . Painful menstrual periods   . Abnormal vaginal Pap smear     10+ years ago- no colpo repeat was normal  . Painful intercourse   . GERD (gastroesophageal reflux disease)   . VWD (acquired von Willebrand's disease) (HCC)   . Anxiety   . Spinal stenosis   . Kidney stone   . Bipolar disorder (HCC)   . AR (allergic rhinitis)   . Pelvic pain in female   . Fibromyalgia   . PTSD (post-traumatic stress disorder)     Past Surgical History  Procedure Laterality Date  . Tubal ligation    . Laparoscopy abdomen diagnostic    . Hysteroscopy      removed polyps  . [facility]  . Abdominal  hysterectomy    . Laparoscopy Left 01/10/2015    Procedure: LAPAROSCOPY OPERATIVE with biopsy, left oopherectomy;  Surgeon: Herold HarmsMartin A Defrancesco, MD;  Location: [facility] ORS;  Service: Gynecology;  Laterality: Left;    Family History  Problem Relation Age of Onset  . Breast cancer Paternal Grandmother   . Diabetes Maternal Grandmother   . Hypertension Father   . Sleep apnea Mother   . Diabetes Maternal Aunt   . Diabetes Maternal Uncle     Social History   Social History  . Marital Status: Widowed    Spouse Name: N/A  . Number of Children: 1  . Years of Education: N/A   Occupational History  . Un-employed due to bipolar    Social History Main Topics  . Smoking status: Current Every Day Smoker -- 0.50 packs/day    Types: Cigarettes    Start date: 05/18/1995  . Smokeless tobacco: Never Used  . Alcohol Use: No     Comment: Socially  . Drug Use: No  . Sexual Activity: No   Other Topics Concern  . Not on file   Social History Narrative   Regular Exercise -  NO   Daily Caffeine Use:  1 cup coffee in am      1 child, one step child  Current outpatient prescriptions:  .  ALPRAZolam (XANAX) 0.5 MG tablet, Take 1 tablet (0.5 mg total) by mouth 3 (three) times daily as needed for anxiety., Disp: 90 tablet, Rfl: 0 .  Biotin w/ Vitamins C & E (HAIR SKIN & NAILS GUMMIES) 1250-7.5-7.5 MCG-MG-UNT CHEW, Chew 1 each by mouth daily., Disp: , Rfl:  .  LYRICA 150 MG capsule, Take 1 capsule (150 mg total) by mouth 3 (three) times daily., Disp: 90 capsule, Rfl: 0 .  Methylnaltrexone Bromide (RELISTOR) 150 MG TABS, Take 450 mg by mouth every morning., Disp: 90 tablet, Rfl: 0 .  oxyCODONE-acetaminophen (PERCOCET) 7.5-325 MG tablet, Take 1 tablet by mouth every 8 (eight) hours as needed for severe pain., Disp: 90 tablet, Rfl: 0 .  promethazine (PHENERGAN) 25 MG tablet, Take 1 tablet (25 mg total) by mouth daily as needed for nausea or vomiting., Disp: 30 tablet, Rfl: 0 .  QUEtiapine  (SEROQUEL) 300 MG tablet, , Disp: , Rfl:   Allergies  Allergen Reactions  . Wellbutrin [Bupropion Hcl] Other (See Comments)    Reaction:  Suicidal   . Augmentin [Amoxicillin-Pot Clavulanate] Diarrhea, Nausea And Vomiting and Other (See Comments)    Has patient had a PCN reaction causing immediate rash, facial/tongue/throat swelling, SOB or lightheadedness with hypotension: No Has patient had a PCN reaction causing severe rash involving mucus membranes or skin necrosis: No Has patient had a PCN reaction that required hospitalization No Has patient had a PCN reaction occurring within the last 10 years: Yes If all of the above answers are "NO", then may proceed with Cephalosporin use.  Marland Kitchen Penicillins Diarrhea, Nausea And Vomiting and Other (See Comments)    Has patient had a PCN reaction causing immediate rash, facial/tongue/throat swelling, SOB or lightheadedness with hypotension: No Has patient had a PCN reaction causing severe rash involving mucus membranes or skin necrosis: No Has patient had a PCN reaction that required hospitalization No Has patient had a PCN reaction occurring within the last 10 years: Yes If all of the above answers are "NO", then may proceed with Cephalosporin use.  [name]  [Hydrocodone-Ibuprofen] Nausea And Vomiting  . Lasix [Furosemide] Rash  . Nitrofurantoin Monohyd Macro Rash     Review of Systems  Constitutional: Positive for irritability.  Musculoskeletal: Positive for myalgias, back pain and joint pain.  Psychiatric/Behavioral: Positive for depression. The patient is nervous/anxious and has insomnia.     Objective  Filed Vitals:   12/07/15 1151  BP: 122/71  Pulse: 127  Temp: 98.7 F (37.1 C)  TempSrc: Oral  Resp: 19  Height:  (1.575 m)  Weight: 169 lb (76.658 kg)  SpO2: 98%    Physical Exam  Constitutional: She is oriented to person, place, and time and well-developed, well-nourished, and in no distress.  Cardiovascular: Regular  rhythm, S1 normal, S2 normal and normal heart sounds.  Tachycardia present.   No murmur heard. Pulmonary/Chest: Effort normal and breath sounds normal.  Musculoskeletal:       Cervical back: She exhibits tenderness, pain and spasm.       Back:       Legs: Tenderness to palpation over the lateral aspect of left hip   Neurological: She is alert and oriented to person, place, and time.  Psychiatric: Memory, affect and judgment normal. She exhibits a depressed mood.  Nursing note and vitals reviewed.      Assessment & Plan  1. Fibromyalgia Certainly worse, likely exacerbated by increased stressors. Continue on Lyrica and opioid therapy.  Refills provided. Obtain urine drug screen - LYRICA 150 MG capsule; Take 1 capsule (150 mg total) by mouth 3 (three) times daily.  Dispense: 90 capsule; Refill: 0 - oxyCODONE-acetaminophen (PERCOCET) 7.5-325 MG tablet; Take 1 tablet by mouth every 8 (eight) hours as needed for severe pain.  Dispense: 90 tablet; Refill: 0  2. Generalized anxiety disorder Symptoms of anxiety are worse because of multiple stressors. Continue on Alprazolam as prescribed. Patient aware of the dependence potential, side effects, and drug interactions associated with benzodiazepines. - ALPRAZolam (XANAX) 0.5 MG tablet; Take 1 tablet (0.5 mg total) by mouth 3 (three) times daily as needed for anxiety.  Dispense: 90 tablet; Refill: 0  3. Acute pain of left hip Rule out bursitis with x-rays. Likely part of fibromyalgia - DG HIP UNILAT WITH PELVIS 2-3 VIEWS LEFT; Future   Ever Halberg Asad A. [facility] 12/07/2015 11:59 AM

## 2015-12-12 DIAGNOSIS — L812 Freckles: Secondary | ICD-10-CM | POA: Diagnosis not present

## 2015-12-12 DIAGNOSIS — F4310 Post-traumatic stress disorder, unspecified: Secondary | ICD-10-CM | POA: Diagnosis not present

## 2015-12-12 DIAGNOSIS — L811 Chloasma: Secondary | ICD-10-CM | POA: Diagnosis not present

## 2015-12-12 DIAGNOSIS — L639 Alopecia areata, unspecified: Secondary | ICD-10-CM | POA: Diagnosis not present

## 2015-12-12 DIAGNOSIS — L700 Acne vulgaris: Secondary | ICD-10-CM | POA: Diagnosis not present

## 2015-12-12 DIAGNOSIS — L659 Nonscarring hair loss, unspecified: Secondary | ICD-10-CM | POA: Diagnosis not present

## 2015-12-12 DIAGNOSIS — L0390 Cellulitis, unspecified: Secondary | ICD-10-CM | POA: Diagnosis not present

## 2015-12-29 ENCOUNTER — Encounter: Payer: Self-pay | Admitting: Family Medicine

## 2015-12-29 ENCOUNTER — Ambulatory Visit (INDEPENDENT_AMBULATORY_CARE_PROVIDER_SITE_OTHER): Payer: Medicare Other | Admitting: Family Medicine

## 2015-12-29 VITALS — BP 120/71 | HR 103 | Temp 98.7°F | Resp 17 | Ht 62.0 in | Wt 161.8 lb

## 2015-12-29 DIAGNOSIS — K5903 Drug induced constipation: Secondary | ICD-10-CM | POA: Diagnosis not present

## 2015-12-29 DIAGNOSIS — T50905A Adverse effect of unspecified drugs, medicaments and biological substances, initial encounter: Principal | ICD-10-CM

## 2015-12-29 DIAGNOSIS — F411 Generalized anxiety disorder: Secondary | ICD-10-CM | POA: Diagnosis not present

## 2015-12-29 DIAGNOSIS — T402X5A Adverse effect of other opioids, initial encounter: Secondary | ICD-10-CM

## 2015-12-29 DIAGNOSIS — M797 Fibromyalgia: Secondary | ICD-10-CM | POA: Diagnosis not present

## 2015-12-29 DIAGNOSIS — R112 Nausea with vomiting, unspecified: Secondary | ICD-10-CM | POA: Diagnosis not present

## 2015-12-29 MED ORDER — OXYCODONE-ACETAMINOPHEN 7.5-325 MG PO TABS: 1.0000 | ORAL_TABLET | Freq: Three times a day (TID) | ORAL | Status: DC | PRN

## 2015-12-29 MED ORDER — LYRICA 150 MG PO CAPS: 150.0000 mg | ORAL_CAPSULE | Freq: Three times a day (TID) | ORAL | Status: DC

## 2015-12-29 MED ORDER — ALPRAZOLAM 0.5 MG PO TABS: 0.5000 mg | ORAL_TABLET | Freq: Three times a day (TID) | ORAL | Status: DC | PRN

## 2015-12-29 MED ORDER — PROMETHAZINE HCL 25 MG PO TABS: 25.0000 mg | ORAL_TABLET | Freq: Every day | ORAL | Status: DC | PRN

## 2015-12-29 MED ORDER — NALOXEGOL OXALATE 25 MG PO TABS: 25.0000 mg | ORAL_TABLET | Freq: Every day | ORAL | Status: DC

## 2015-12-29 NOTE — Progress Notes (Signed)
Name: Casey Hodges   MRN: [id number]    DOB: May 09, 1976   Date:12/29/2015       Progress Note  Subjective  Chief Complaint  Chief Complaint  Patient presents with  . Follow-up    1 mo  . Medication Refill    all meds     HPI  Anxiety: Pt. Presents for medication refill and follow up of anxiety. Pt. Is still grieving and is attending counseling sessions after her husband passed away. Anxiety levels are worse because of her hair loss after a scalp infection and now has a bald spot on her scalp. She is on Alprazolam 0.5 mg three times daily as needed.   Fibromyalgia: Pt. Has chronic generalized musculoskeletal pain, most prominent and severe in hips, lower back, neck, and arms. Pain is rated at 5/10. She takes Lyrica 150 mg three times daily and Percocet 7.5-325 mg every 8 hours as needed.   Constipation: Patient reports having constipation as long as she has been on opioid therapy, she has 1-2 BMs/ week on average. She has a lot of bloating and abdominal discomfort as a result.      Past Medical History  Diagnosis Date  . Bipolar affective (HCC)     pt reported  . Uterine polyp   . Eating disorder     Under control per patient  . Renal disorder   . Painful menstrual periods   . Abnormal vaginal Pap smear     10+ years ago- no colpo repeat was normal  . Painful intercourse   . GERD (gastroesophageal reflux disease)   . VWD (acquired von Willebrand's disease) (HCC)   . Anxiety   . Spinal stenosis   . Kidney stone   . Bipolar disorder (HCC)   . AR (allergic rhinitis)   . Pelvic pain in female   . Fibromyalgia   . PTSD (post-traumatic stress disorder)     Past Surgical History  Procedure Laterality Date  . Tubal ligation    . Laparoscopy abdomen diagnostic    . Hysteroscopy      removed polyps  . Laparoscopic vaginal hysterectomy  2015    at [facility]  . Abdominal hysterectomy    . Laparoscopy Left 01/10/2015    Procedure: LAPAROSCOPY OPERATIVE with biopsy, left  oopherectomy;  Surgeon: Herold Harms, MD;  Location: [facility] ORS;  Service: Gynecology;  Laterality: Left;    Family History  Problem Relation Age of Onset  . Breast cancer Paternal Grandmother   . Diabetes Maternal Grandmother   . Hypertension Father   . Sleep apnea Mother   . Diabetes Maternal Aunt   . Diabetes Maternal Uncle     Social History   Social History  . Marital Status: Widowed    Spouse Name: N/A  . Number of Children: 1  . Years of Education: N/A   Occupational History  . Un-employed due to bipolar    Social History Main Topics  . Smoking status: Current Every Day Smoker -- 0.50 packs/day    Types: Cigarettes    Start date: 05/18/1995  . Smokeless tobacco: Never Used  . Alcohol Use: No     Comment: Socially  . Drug Use: No  . Sexual Activity: No   Other Topics Concern  . Not on file   Social History Narrative   Regular Exercise -  NO   Daily Caffeine Use:  1 cup coffee in am      1 child, one step child  Current outpatient prescriptions:  .  ALPRAZolam (XANAX) 0.5 MG tablet, Take 1 tablet (0.5 mg total) by mouth 3 (three) times daily as needed for anxiety., Disp: 90 tablet, Rfl: 0 .  Biotin w/ Vitamins C & E (HAIR SKIN & NAILS GUMMIES) 1250-7.5-7.5 MCG-MG-UNT CHEW, Chew 1 each by mouth daily., Disp: , Rfl:  .  LYRICA 150 MG capsule, Take 1 capsule (150 mg total) by mouth 3 (three) times daily., Disp: 90 capsule, Rfl: 0 .  Methylnaltrexone Bromide (RELISTOR) 150 MG TABS, Take 450 mg by mouth every morning., Disp: 90 tablet, Rfl: 0 .  oxyCODONE-acetaminophen (PERCOCET) 7.5-325 MG tablet, Take 1 tablet by mouth every 8 (eight) hours as needed for severe pain., Disp: 90 tablet, Rfl: 0 .  promethazine (PHENERGAN) 25 MG tablet, Take 1 tablet (25 mg total) by mouth daily as needed for nausea or vomiting., Disp: 30 tablet, Rfl: 0 .  QUEtiapine (SEROQUEL) 300 MG tablet, , Disp: , Rfl:   Allergies  Allergen Reactions  . Wellbutrin [Bupropion  Hcl] Other (See Comments)    Reaction:  Suicidal   . Augmentin [Amoxicillin-Pot Clavulanate] Diarrhea, Nausea And Vomiting and Other (See Comments)    Has patient had a PCN reaction causing immediate rash, facial/tongue/throat swelling, SOB or lightheadedness with hypotension: No Has patient had a PCN reaction causing severe rash involving mucus membranes or skin necrosis: No Has patient had a PCN reaction that required hospitalization No Has patient had a PCN reaction occurring within the last 10 years: Yes If all of the above answers are "NO", then may proceed with Cephalosporin use.  Marland Kitchen. Penicillins Diarrhea, Nausea And Vomiting and Other (See Comments)    Has patient had a PCN reaction causing immediate rash, facial/tongue/throat swelling, SOB or lightheadedness with hypotension: No Has patient had a PCN reaction causing severe rash involving mucus membranes or skin necrosis: No Has patient had a PCN reaction that required hospitalization No Has patient had a PCN reaction occurring within the last 10 years: Yes If all of the above answers are "NO", then may proceed with Cephalosporin use.  [name]. Vicoprofen  [Hydrocodone-Ibuprofen] Nausea And Vomiting  . Lasix [Furosemide] Rash  . Nitrofurantoin Monohyd Macro Rash     Review of Systems  Gastrointestinal: Positive for constipation.  Musculoskeletal: Positive for myalgias and back pain.  Psychiatric/Behavioral: Positive for depression. The patient is nervous/anxious.     Objective  Filed Vitals:   12/29/15 1428  BP: 120/71  Pulse: 103  Temp: 98.7 F (37.1 C)  TempSrc: Oral  Resp: 17  Height: 5\' 2"  (1.575 m)  Weight: 161 lb 12.8 oz (73.392 kg)  SpO2: 98%    Physical Exam  Constitutional: She is oriented to person, place, and time and well-developed, well-nourished, and in no distress.  HENT:  Head: Normocephalic and atraumatic.  Cardiovascular: Normal rate, regular rhythm and normal heart sounds.   Pulmonary/Chest: Effort  normal and breath sounds normal. She has no decreased breath sounds. She has no rhonchi.  Abdominal: Soft. Bowel sounds are normal. There is no tenderness.  Musculoskeletal:       Right hip: She exhibits tenderness.       Left hip: She exhibits tenderness.       Cervical back: She exhibits tenderness, pain and spasm.       Lumbar back: She exhibits tenderness, pain and spasm.       Back:  Tenderness to palpation over the right and left lateral hips, pain radiating down the thighs.  Neurological: She  is alert and oriented to person, place, and time.  Nursing note and vitals reviewed.     Assessment & Plan  1. Drug-induced nausea and vomiting  - promethazine (PHENERGAN) 25 MG tablet; Take 1 tablet (25 mg total) by mouth daily as needed for nausea or vomiting.  Dispense: 30 tablet; Refill: 0  2. Fibromyalgia Stable and responsive to opioid therapy along with Lyrica. Refills provided - LYRICA 150 MG capsule; Take 1 capsule (150 mg total) by mouth 3 (three) times daily.  Dispense: 90 capsule; Refill: 0 - oxyCODONE-acetaminophen (PERCOCET) 7.5-325 MG tablet; Take 1 tablet by mouth every 8 (eight) hours as needed for severe pain.  Dispense: 90 tablet; Refill: 0  3. Generalized anxiety disorder Symptoms responsive to alprazolam taken up to 3 times daily as needed. - ALPRAZolam (XANAX) 0.5 MG tablet; Take 1 tablet (0.5 mg total) by mouth 3 (three) times daily as needed for anxiety.  Dispense: 90 tablet; Refill: 0  4. Constipation due to opioid therapy Started on Movantik for opioid-induced constipation, educated on potential adverse effects and the risk of opioid withdrawal. Follow-up in one month - naloxegol oxalate (MOVANTIK) 25 MG TABS tablet; Take 1 tablet (25 mg total) by mouth daily.  Dispense: 30 tablet; Refill: 0   Zayvian Mcmurtry Asad A. [facility] 12/29/2015 2:43 PM

## 2015-12-30 DIAGNOSIS — F4310 Post-traumatic stress disorder, unspecified: Secondary | ICD-10-CM | POA: Diagnosis not present

## 2015-12-30 DIAGNOSIS — F3132 Bipolar disorder, current episode depressed, moderate: Secondary | ICD-10-CM | POA: Diagnosis not present

## 2016-01-19 ENCOUNTER — Ambulatory Visit (INDEPENDENT_AMBULATORY_CARE_PROVIDER_SITE_OTHER): Payer: Medicare Other | Admitting: Family Medicine

## 2016-01-19 ENCOUNTER — Encounter: Payer: Self-pay | Admitting: Family Medicine

## 2016-01-19 DIAGNOSIS — R112 Nausea with vomiting, unspecified: Secondary | ICD-10-CM

## 2016-01-19 DIAGNOSIS — M797 Fibromyalgia: Secondary | ICD-10-CM

## 2016-01-19 DIAGNOSIS — F411 Generalized anxiety disorder: Secondary | ICD-10-CM

## 2016-01-19 DIAGNOSIS — T50905A Adverse effect of unspecified drugs, medicaments and biological substances, initial encounter: Principal | ICD-10-CM

## 2016-01-19 MED ORDER — LYRICA 150 MG PO CAPS: 150.0000 mg | ORAL_CAPSULE | Freq: Three times a day (TID) | ORAL | 0 refills | Status: DC

## 2016-01-19 MED ORDER — OXYCODONE-ACETAMINOPHEN 7.5-325 MG PO TABS: 1.0000 | ORAL_TABLET | Freq: Three times a day (TID) | ORAL | 0 refills | Status: DC | PRN

## 2016-01-19 MED ORDER — PROMETHAZINE HCL 25 MG PO TABS: 25.0000 mg | ORAL_TABLET | Freq: Every day | ORAL | 0 refills | Status: DC | PRN

## 2016-01-19 MED ORDER — ALPRAZOLAM 0.5 MG PO TABS: 0.5000 mg | ORAL_TABLET | Freq: Three times a day (TID) | ORAL | 0 refills | Status: DC | PRN

## 2016-01-19 NOTE — Progress Notes (Signed)
Name: Casey Hodges   MRN: [id number]    DOB: Mar 27, 1976   Date:01/19/2016       Progress Note  Subjective  Chief Complaint  Chief Complaint  Patient presents with  . Pain    follow up  . Anxiety    HPI  Anxiety: Pt. Presents for medication refill and follow up on anxiety, she reports anxiety is unchanged, still has some episodes of panic attack associated with anxiety. Takes Alprazolam 0.5 mg three times daily as needed.   Fibromyalgia: Pt. Presents for follow up of Fibromyalgia, includes generalized fatigue and pain in ankles, knees, hips, and lower back. She is on Lyrica 150 mg three times daily and Percocet 7.5-325 mg every 8 hours as needed. This combination helps relieve her pain.   Past Medical History:  Diagnosis Date  . Abnormal vaginal Pap smear    10+ years ago- no colpo repeat was normal  . Anxiety   . AR (allergic rhinitis)   . Bipolar affective (HCC)    pt reported  . Bipolar disorder (HCC)   . Eating disorder    Under control per patient  . Fibromyalgia   . GERD (gastroesophageal reflux disease)   . Kidney stone   . Painful intercourse   . Painful menstrual periods   . Pelvic pain in female   . PTSD (post-traumatic stress disorder)   . Renal disorder   . Spinal stenosis   . Uterine polyp   . VWD (acquired von Willebrand's disease) [facility])     Past Surgical History:  Procedure Laterality Date  . ABDOMINAL HYSTERECTOMY    . HYSTEROSCOPY     removed polyps  . LAPAROSCOPIC VAGINAL HYSTERECTOMY  2015   at [facility]  . LAPAROSCOPY Left 01/10/2015   Procedure: LAPAROSCOPY OPERATIVE with biopsy, left oopherectomy;  Surgeon: Herold Harms, MD;  Location: [facility] ORS;  Service: Gynecology;  Laterality: Left;  . LAPAROSCOPY ABDOMEN DIAGNOSTIC    . TUBAL LIGATION      Family History  Problem Relation Age of Onset  . Hypertension Father   . Sleep apnea Mother   . Breast cancer Paternal Grandmother   . Diabetes Maternal Grandmother   . Diabetes  Maternal Aunt   . Diabetes Maternal Uncle     Social History   Social History  . Marital status: Widowed    Spouse name: N/A  . Number of children: 1  . Years of education: N/A   Occupational History  . Un-employed due to bipolar    Social History Main Topics  . Smoking status: Current Every Day Smoker    Packs/day: 0.50    Types: Cigarettes    Start date: 05/18/1995  . Smokeless tobacco: Never Used  . Alcohol use No     Comment: Socially  . Drug use: No  . Sexual activity: No   Other Topics Concern  . Not on file   Social History Narrative   Regular Exercise -  NO   Daily Caffeine Use:  1 cup coffee in am      1 child, one step child           Current Outpatient Prescriptions:  .  ALPRAZolam (XANAX) 0.5 MG tablet, Take 1 tablet (0.5 mg total) by mouth 3 (three) times daily as needed for anxiety., Disp: 90 tablet, Rfl: 0 .  Biotin w/ Vitamins C & E (HAIR SKIN & NAILS GUMMIES) 1250-7.5-7.5 MCG-MG-UNT CHEW, Chew 1 each by mouth daily., Disp: , Rfl:  .  LYRICA 150 MG capsule, Take 1 capsule (150 mg total) by mouth 3 (three) times daily., Disp: 90 capsule, Rfl: 0 .  naloxegol oxalate (MOVANTIK) 25 MG TABS tablet, Take 1 tablet (25 mg total) by mouth daily., Disp: 30 tablet, Rfl: 0 .  oxyCODONE-acetaminophen (PERCOCET) 7.5-325 MG tablet, Take 1 tablet by mouth every 8 (eight) hours as needed for severe pain., Disp: 90 tablet, Rfl: 0 .  promethazine (PHENERGAN) 25 MG tablet, Take 1 tablet (25 mg total) by mouth daily as needed for nausea or vomiting., Disp: 30 tablet, Rfl: 0 .  QUEtiapine (SEROQUEL) 300 MG tablet, , Disp: , Rfl:   Allergies  Allergen Reactions  . Wellbutrin [Bupropion Hcl] Other (See Comments)    Reaction:  Suicidal   . Augmentin [Amoxicillin-Pot Clavulanate] Diarrhea, Nausea And Vomiting and Other (See Comments)    Has patient had a PCN reaction causing immediate rash, facial/tongue/throat swelling, SOB or lightheadedness with hypotension: No Has  patient had a PCN reaction causing severe rash involving mucus membranes or skin necrosis: No Has patient had a PCN reaction that required hospitalization No Has patient had a PCN reaction occurring within the last 10 years: Yes If all of the above answers are "NO", then may proceed with Cephalosporin use.  Marland Kitchen Penicillins Diarrhea, Nausea And Vomiting and Other (See Comments)    Has patient had a PCN reaction causing immediate rash, facial/tongue/throat swelling, SOB or lightheadedness with hypotension: No Has patient had a PCN reaction causing severe rash involving mucus membranes or skin necrosis: No Has patient had a PCN reaction that required hospitalization No Has patient had a PCN reaction occurring within the last 10 years: Yes If all of the above answers are "NO", then may proceed with Cephalosporin use.  [name]  [Hydrocodone-Ibuprofen] Nausea And Vomiting  . Lasix [Furosemide] Rash  . Nitrofurantoin Monohyd Macro Rash     Review of Systems  Constitutional: Positive for malaise/fatigue.  Musculoskeletal: Positive for back pain and joint pain.  Psychiatric/Behavioral: The patient is nervous/anxious and has insomnia.       Objective  Vitals:   01/19/16 1454  BP: 116/68  Pulse: (!) 120  Resp: 16  Temp: 99.4 F (37.4 C)  TempSrc: Oral  SpO2: 97%  Weight: 170 lb 14.4 oz (77.5 kg)  Height:  (1.575 m)    Physical Exam  Constitutional: She is oriented to person, place, and time and well-developed, well-nourished, and in no distress.  HENT:  Head: Normocephalic and atraumatic.  Cardiovascular: Normal rate, regular rhythm and normal heart sounds.   Pulmonary/Chest: Effort normal and breath sounds normal. She has no decreased breath sounds. She has no rhonchi.  Abdominal: Soft. Bowel sounds are normal. There is no tenderness.  Musculoskeletal:       Right hip: She exhibits tenderness.       Left hip: She exhibits tenderness.       Cervical back: She exhibits  tenderness, pain and spasm.       Lumbar back: She exhibits tenderness, pain and spasm.       Back:       Legs: Tenderness to palpation over the right and left lateral hips, pain radiating down the thighs.  Neurological: She is alert and oriented to person, place, and time.  Psychiatric: Mood, memory, affect and judgment normal.  Nursing note and vitals reviewed.    Assessment & Plan  1. Drug-induced nausea and vomiting  - promethazine (PHENERGAN) 25 MG tablet; Take 1 tablet (25 mg total)  by mouth daily as needed for nausea or vomiting.  Dispense: 90 tablet; Refill: 0  2. Fibromyalgia Stable, responsive to opioid therapy - LYRICA 150 MG capsule; Take 1 capsule (150 mg total) by mouth 3 (three) times daily.  Dispense: 90 capsule; Refill: 0 - oxyCODONE-acetaminophen (PERCOCET) 7.5-325 MG tablet; Take 1 tablet by mouth every 8 (eight) hours as needed for severe pain.  Dispense: 90 tablet; Refill: 0  3. Generalized anxiety disorder Anxiety is stable, responsive to alprazolam taken up to 3 times daily as needed. - ALPRAZolam (XANAX) 0.5 MG tablet; Take 1 tablet (0.5 mg total) by mouth 3 (three) times daily as needed for anxiety.  Dispense: 90 tablet; Refill: 0   Ritter Helsley Asad A[facility] 01/19/2016 3:14 PM

## 2016-01-26 ENCOUNTER — Emergency Department
Admission: EM | Admit: 2016-01-26 | Discharge: 2016-01-26 | Disposition: A | Discharge status: Home / Self Care | Payer: Medicare Other | Attending: Emergency Medicine | Admitting: Emergency Medicine

## 2016-01-26 ENCOUNTER — Encounter: Payer: Self-pay | Admitting: Emergency Medicine

## 2016-01-26 DIAGNOSIS — L03116 Cellulitis of left lower limb: Secondary | ICD-10-CM | POA: Diagnosis not present

## 2016-01-26 DIAGNOSIS — R112 Nausea with vomiting, unspecified: Secondary | ICD-10-CM | POA: Insufficient documentation

## 2016-01-26 DIAGNOSIS — M79672 Pain in left foot: Secondary | ICD-10-CM | POA: Diagnosis present

## 2016-01-26 DIAGNOSIS — F17210 Nicotine dependence, cigarettes, uncomplicated: Secondary | ICD-10-CM | POA: Diagnosis not present

## 2016-01-26 LAB — COMPREHENSIVE METABOLIC PANEL
ALT: 28 U/L (ref 14–54)
AST: 24 U/L (ref 15–41)
Albumin: 3.9 g/dL (ref 3.5–5.0)
Alkaline Phosphatase: 84 U/L (ref 38–126)
Anion gap: 9 (ref 5–15)
BILIRUBIN TOTAL: 0.3 mg/dL (ref 0.3–1.2)
BUN: 13 mg/dL (ref 6–20)
CO2: 25 mmol/L (ref 22–32)
CREATININE: 0.69 mg/dL (ref 0.44–1.00)
Calcium: 9.3 mg/dL (ref 8.9–10.3)
Chloride: 104 mmol/L (ref 101–111)
Glucose, Bld: 100 mg/dL — ABNORMAL HIGH (ref 65–99)
POTASSIUM: 3.8 mmol/L (ref 3.5–5.1)
Sodium: 138 mmol/L (ref 135–145)
TOTAL PROTEIN: 7.6 g/dL (ref 6.5–8.1)

## 2016-01-26 LAB — CBC
HCT: 40.5 % (ref 35.0–47.0)
Hemoglobin: 14 g/dL (ref 12.0–16.0)
MCH: 30.8 pg (ref 26.0–34.0)
MCHC: 34.7 g/dL (ref 32.0–36.0)
MCV: 88.9 fL (ref 80.0–100.0)
PLATELETS: 237 10*3/uL (ref 150–440)
RBC: 4.55 MIL/uL (ref 3.80–5.20)
RDW: 13.5 % (ref 11.5–14.5)
WBC: 7.3 10*3/uL (ref 3.6–11.0)

## 2016-01-26 LAB — LIPASE, BLOOD: LIPASE: 19 U/L (ref 11–51)

## 2016-01-26 MED ORDER — ONDANSETRON HCL 4 MG PO TABS: 4.0000 mg | ORAL_TABLET | Freq: Every day | ORAL | 1 refills | Status: DC | PRN

## 2016-01-26 MED ORDER — MUPIROCIN 2 % EX OINT: TOPICAL_OINTMENT | CUTANEOUS | 0 refills | Status: DC

## 2016-01-26 MED ORDER — SULFAMETHOXAZOLE-TRIMETHOPRIM 800-160 MG PO TABS: 1.0000 | ORAL_TABLET | Freq: Two times a day (BID) | ORAL | 0 refills | Status: DC

## 2016-01-26 MED ORDER — ONDANSETRON 4 MG PO TBDP: 4.0000 mg | ORAL_TABLET | Freq: Once | ORAL | Status: DC

## 2016-01-26 MED ORDER — ONDANSETRON 4 MG PO TBDP
ORAL_TABLET | ORAL | Status: AC
  Filled 2016-01-26: qty 1

## 2016-01-26 NOTE — ED Triage Notes (Addendum)
Left foot tattoo place on 7/25.  Patient states since that time area has become reddened and draining purulent drainage.  Also c/o nausea and vomiting x 3 days.

## 2016-01-26 NOTE — ED Provider Notes (Signed)
[facility] Emergency Department Provider Note        Time seen: ----------------------------------------- 5:21 PM on 01/26/2016 -----------------------------------------    I have reviewed the triage vital signs and the nursing notes.   HISTORY  Chief Complaint Cellulitis; Nausea; and Emesis    HPI Casey Hodges is a 40 y.o. female who presents the ER for left foot pain. Patient states she had left foot tattoo placed 8 days ago. Patient states she's had some redness and drainage from the dorsum of the left foot around the tattoo site. She also has had nausea and vomiting for 3 days. She denies fevers or other complaints.   Past Medical History:  Diagnosis Date  . Abnormal vaginal Pap smear    10+ years ago- no colpo repeat was normal  . Anxiety   . AR (allergic rhinitis)   . Bipolar affective (HCC)    pt reported  . Bipolar disorder (HCC)   . Eating disorder    Under control per patient  . Fibromyalgia   . GERD (gastroesophageal reflux disease)   . Kidney stone   . Painful intercourse   . Painful menstrual periods   . Pelvic pain in female   . PTSD (post-traumatic stress disorder)   . Renal disorder   . Spinal stenosis   . Uterine polyp   . VWD (acquired von Willebrand's disease) [facility])     Patient Active Problem List   Diagnosis Date Noted  . Acute pain of left hip 12/07/2015  . Cellulitis of head or scalp 11/16/2015  . Constipation due to opioid therapy 11/04/2015  . Cellulitis of face 09/21/2015  . Severe episode of recurrent major depressive disorder, without psychotic features (HCC)   . GERD (gastroesophageal reflux disease) 09/16/2015  . Sepsis (HCC) 09/16/2015  . Facial cellulitis 09/16/2015  . Cervical spine pain 09/08/2015  . Smoker 07/25/2015  . Upper respiratory infection 07/12/2015  . Snoring 06/09/2015  . Cardiac murmur 05/25/2015  . Cloudy urine 05/25/2015  . Numbness of foot 04/20/2015  . Drug-induced nausea and  vomiting 03/22/2015  . Need for immunization against influenza 03/21/2015  . Cyst, bone 02/14/2015  . Pelvic adhesive disease 01/21/2015  . Tick bite 01/20/2015  . Arthritis 12/19/2014  . Calculus of kidney 12/13/2014  . Patchy loss of hair 12/13/2014  . Anxiety disorder 12/13/2014  . Postural dizziness 12/13/2014  . Subcutaneous cyst 12/13/2014  . Nephrolithiasis 05/23/2012  . Chronic pelvic pain in female 05/23/2012  . Fibromyalgia 05/23/2012  . Bipolar disorder with depression (HCC) 06/05/2011    Past Surgical History:  Procedure Laterality Date  . ABDOMINAL HYSTERECTOMY    . HYSTEROSCOPY     removed polyps  . LAPAROSCOPIC VAGINAL HYSTERECTOMY  2015   at [facility]  . LAPAROSCOPY Left 01/10/2015   Procedure: LAPAROSCOPY OPERATIVE with biopsy, left oopherectomy;  Surgeon: Herold Harms, MD;  Location: [facility] ORS;  Service: Gynecology;  Laterality: Left;  . LAPAROSCOPY ABDOMEN DIAGNOSTIC    . TUBAL LIGATION      Allergies Wellbutrin [bupropion hcl]; Augmentin [amoxicillin-pot clavulanate]; Penicillins; Vicoprofen  [hydrocodone-ibuprofen]; Lasix [furosemide]; and Nitrofurantoin monohyd macro  Social History Social History  Substance Use Topics  . Smoking status: Current Every Day Smoker    Packs/day: 0.50    Types: Cigarettes    Start date: 05/18/1995  . Smokeless tobacco: Never Used  . Alcohol use No     Comment: Socially    Review of Systems Constitutional: Negative for fever. Cardiovascular: Negative for  chest pain. Respiratory: Negative for shortness of breath. Gastrointestinal: Negative for abdominal pain, Positive for vomiting Genitourinary: Negative for dysuria. Musculoskeletal: Positive for left foot pain Skin: Positive for left foot erythema Neurological: Negative for headaches, focal weakness or numbness.  10-point ROS otherwise negative.  ____________________________________________   PHYSICAL EXAM:  VITAL SIGNS: ED Triage Vitals  Enc  Vitals Group     BP 01/26/16 1649 125/86     Pulse Rate 01/26/16 1649 86     Resp 01/26/16 1649 18     Temp 01/26/16 1649 97.6 F (36.4 C)     Temp Source 01/26/16 1649 Oral     SpO2 01/26/16 1649 99 %     Weight 01/26/16 1649 170 lb (77.1 kg)     Height 01/26/16 1649 5\' 2"  (1.575 m)     Head Circumference --      Peak Flow --      Pain Score 01/26/16 1650 8     Pain Loc --      Pain Edu? --      Excl. in GC? --     Constitutional: Alert and oriented. Well appearing and in no distress. Eyes: Conjunctivae are normal. Normal extraocular movements. Musculoskeletal: Nontender with normal range of motion in all extremities. No lower extremity tenderness nor edema. Neurologic:  Normal speech and language. No gross focal neurologic deficits are appreciated.  Skin:  Dorsum of the left foot is tender to touch, there is a 2.5 x 2.5 cm area of erythema. There is a small open wound with no significant drainage that corresponds to a new appearing tattoo. Psychiatric: Mood and affect are normal. Speech and behavior are normal.  ___________________________________________  ED COURSE:  Pertinent labs & imaging results that were available during my care of the patient were reviewed by me and considered in my medical decision making (see chart for details). Clinical Course  Patient is no acute distress, left foot wound infection is unrelated to her nausea and vomiting. She'll be given antiemetics, Septra DS and Bactroban. She is stable for outpatient follow-up.  Procedures ____________________________________________   LABS (pertinent positives/negatives)  Labs Reviewed  COMPREHENSIVE METABOLIC PANEL - Abnormal; Notable for the following:       Result Value   Glucose, Bld 100 (*)    All other components within normal limits  LIPASE, BLOOD  CBC  URINALYSIS COMPLETEWITH MICROSCOPIC ([facility] ONLY)  ____________________________________________  FINAL ASSESSMENT AND PLAN  Cellulitis,  vomiting  Plan: Patient with labs as dictated above. Patient is in no acute distress, as dictated above she'll be given Septra DS, Bactroban and Zofran. She is stable for outpatient follow-up.   Emily Filbert, MD   Note: This dictation was prepared with Dragon dictation. Any transcriptional errors that result from this process are unintentional    Emily Filbert, MD 01/26/16 1723

## 2016-01-30 DIAGNOSIS — F319 Bipolar disorder, unspecified: Secondary | ICD-10-CM | POA: Diagnosis not present

## 2016-01-30 DIAGNOSIS — F3132 Bipolar disorder, current episode depressed, moderate: Secondary | ICD-10-CM | POA: Diagnosis not present

## 2016-01-30 DIAGNOSIS — F4310 Post-traumatic stress disorder, unspecified: Secondary | ICD-10-CM | POA: Diagnosis not present

## 2016-02-01 DIAGNOSIS — F3181 Bipolar II disorder: Secondary | ICD-10-CM | POA: Diagnosis not present

## 2016-02-01 DIAGNOSIS — F4310 Post-traumatic stress disorder, unspecified: Secondary | ICD-10-CM | POA: Diagnosis not present

## 2016-02-01 DIAGNOSIS — F633 Trichotillomania: Secondary | ICD-10-CM | POA: Diagnosis not present

## 2016-02-17 DIAGNOSIS — F633 Trichotillomania: Secondary | ICD-10-CM | POA: Diagnosis not present

## 2016-02-17 DIAGNOSIS — F3181 Bipolar II disorder: Secondary | ICD-10-CM | POA: Diagnosis not present

## 2016-02-17 DIAGNOSIS — F4310 Post-traumatic stress disorder, unspecified: Secondary | ICD-10-CM | POA: Diagnosis not present

## 2016-02-20 ENCOUNTER — Ambulatory Visit (INDEPENDENT_AMBULATORY_CARE_PROVIDER_SITE_OTHER): Payer: Medicare Other | Admitting: Family Medicine

## 2016-02-20 ENCOUNTER — Encounter: Payer: Self-pay | Admitting: Family Medicine

## 2016-02-20 VITALS — BP 122/73 | HR 118 | Temp 98.7°F | Resp 17 | Ht 62.0 in | Wt 167.8 lb

## 2016-02-20 DIAGNOSIS — F411 Generalized anxiety disorder: Secondary | ICD-10-CM

## 2016-02-20 DIAGNOSIS — M797 Fibromyalgia: Secondary | ICD-10-CM | POA: Diagnosis not present

## 2016-02-20 MED ORDER — LYRICA 150 MG PO CAPS: 150.0000 mg | ORAL_CAPSULE | Freq: Three times a day (TID) | ORAL | 0 refills | Status: DC

## 2016-02-20 MED ORDER — ALPRAZOLAM 0.5 MG PO TABS: 0.5000 mg | ORAL_TABLET | Freq: Three times a day (TID) | ORAL | 0 refills | Status: DC | PRN

## 2016-02-20 MED ORDER — OXYCODONE-ACETAMINOPHEN 7.5-325 MG PO TABS: 1.0000 | ORAL_TABLET | Freq: Three times a day (TID) | ORAL | 0 refills | Status: DC | PRN

## 2016-02-20 NOTE — Progress Notes (Signed)
Name: Casey Hodges   MRN: [id number]    DOB: 03/08/1976   Date:02/20/2016       Progress Note  Subjective  Chief Complaint  Chief Complaint  Patient presents with  . Follow-up    1 mo  . Medication Refill    HPI  Anxiety: Pt. Presents for medication refill and follow up on anxiety, she reports anxiety is unchanged, still has some episodes of panic attack associated with anxiety, improved while taking ALprazolam.  She takes 0.5 mg three times daily as needed, which helps relieve her symptoms.  Fibromyalgia: Pt. Presents for follow up of Fibromyalgia, includes generalized fatigue and pain in, knees, hips, and lower back. She is on Lyrica 150 mg three times daily and Percocet 7.5-325 mg every 8 hours as needed. This combination helps relieve her pain. No side effects reported from   Past Medical History:  Diagnosis Date  . Abnormal vaginal Pap smear    10+ years ago- no colpo repeat was normal  . Anxiety   . AR (allergic rhinitis)   . Bipolar affective (HCC)    pt reported  . Bipolar disorder (HCC)   . Eating disorder    Under control per patient  . Fibromyalgia   . GERD (gastroesophageal reflux disease)   . Kidney stone   . Painful intercourse   . Painful menstrual periods   . Pelvic pain in female   . PTSD (post-traumatic stress disorder)   . Renal disorder   . Spinal stenosis   . Uterine polyp   . VWD (acquired von Willebrand's disease) [facility])     Past Surgical History:  Procedure Laterality Date  . ABDOMINAL HYSTERECTOMY    . HYSTEROSCOPY     removed polyps  . LAPAROSCOPIC VAGINAL HYSTERECTOMY  2015   at [facility]  . LAPAROSCOPY Left 01/10/2015   Procedure: LAPAROSCOPY OPERATIVE with biopsy, left oopherectomy;  Surgeon: Herold Harms, MD;  Location: [facility] ORS;  Service: Gynecology;  Laterality: Left;  . LAPAROSCOPY ABDOMEN DIAGNOSTIC    . TUBAL LIGATION      Family History  Problem Relation Age of Onset  . Hypertension Father   . Sleep apnea Mother    . Breast cancer Paternal Grandmother   . Diabetes Maternal Grandmother   . Diabetes Maternal Aunt   . Diabetes Maternal Uncle     Social History   Social History  . Marital status: Widowed    Spouse name: N/A  . Number of children: 1  . Years of education: N/A   Occupational History  . Un-employed due to bipolar    Social History Main Topics  . Smoking status: Current Every Day Smoker    Packs/day: 0.50    Types: Cigarettes    Start date: 05/18/1995  . Smokeless tobacco: Never Used  . Alcohol use No     Comment: Socially  . Drug use: No  . Sexual activity: No   Other Topics Concern  . Not on file   Social History Narrative   Regular Exercise -  NO   Daily Caffeine Use:  1 cup coffee in am      1 child, one step child           Current Outpatient Prescriptions:  .  ALPRAZolam (XANAX) 0.5 MG tablet, Take 1 tablet (0.5 mg total) by mouth 3 (three) times daily as needed for anxiety., Disp: 90 tablet, Rfl: 0 .  Biotin w/ Vitamins C & E (HAIR SKIN & NAILS GUMMIES)  1250-7.5-7.5 MCG-MG-UNT CHEW, Chew 1 each by mouth daily., Disp: , Rfl:  .  LYRICA 150 MG capsule, Take 1 capsule (150 mg total) by mouth 3 (three) times daily., Disp: 90 capsule, Rfl: 0 .  mupirocin ointment (BACTROBAN) 2 %, Apply to affected area 3 times daily, Disp: 22 g, Rfl: 0 .  mupirocin ointment (BACTROBAN) 2 %, Apply to affected area 3 times daily, Disp: 22 g, Rfl: 0 .  naloxegol oxalate (MOVANTIK) 25 MG TABS tablet, Take 1 tablet (25 mg total) by mouth daily., Disp: 30 tablet, Rfl: 0 .  ondansetron (ZOFRAN) 4 MG tablet, Take 1 tablet (4 mg total) by mouth daily as needed for nausea or vomiting., Disp: 20 tablet, Rfl: 1 .  ondansetron (ZOFRAN) 4 MG tablet, Take 1 tablet (4 mg total) by mouth daily as needed for nausea or vomiting., Disp: 20 tablet, Rfl: 1 .  oxyCODONE-acetaminophen (PERCOCET) 7.5-325 MG tablet, Take 1 tablet by mouth every 8 (eight) hours as needed for severe pain., Disp: 90 tablet,  Rfl: 0 .  promethazine (PHENERGAN) 25 MG tablet, Take 1 tablet (25 mg total) by mouth daily as needed for nausea or vomiting., Disp: 90 tablet, Rfl: 0 .  QUEtiapine (SEROQUEL) 300 MG tablet, , Disp: , Rfl:  .  sulfamethoxazole-trimethoprim (BACTRIM DS) 800-160 MG tablet, Take 1 tablet by mouth 2 (two) times daily., Disp: 20 tablet, Rfl: 0 .  sulfamethoxazole-trimethoprim (BACTRIM DS) 800-160 MG tablet, Take 1 tablet by mouth 2 (two) times daily., Disp: 20 tablet, Rfl: 0   Review of Systems  Constitutional: Positive for malaise/fatigue. Negative for chills and fever.  Musculoskeletal: Positive for back pain and joint pain.  Psychiatric/Behavioral: Positive for depression. The patient is nervous/anxious and has insomnia.      Objective  Vitals:   02/20/16 1355  BP: 122/73  Pulse: (!) 118  Resp: 17  Temp: 98.7 F (37.1 C)  TempSrc: Oral  SpO2: 97%  Weight: 167 lb 12.8 oz (76.1 kg)  Height: 5\' 2"  (1.575 m)    Physical Exam  Constitutional: She is oriented to person, place, and time and well-developed, well-nourished, and in no distress.  HENT:  Head: Normocephalic and atraumatic.  Cardiovascular: Normal rate, regular rhythm, S1 normal, S2 normal and normal heart sounds.   Pulmonary/Chest: Effort normal and breath sounds normal. She has no decreased breath sounds. She has no rhonchi.  Abdominal: Soft. Bowel sounds are normal. There is no tenderness.  Musculoskeletal:       Lumbar back: She exhibits tenderness, pain and spasm.       Back:  Tenderness to palpation over the right and left lateral hips, pain radiating down the thighs.  Neurological: She is alert and oriented to person, place, and time.  Psychiatric: Mood, memory, affect and judgment normal.  Nursing note and vitals reviewed.    Assessment & Plan  1. Fibromyalgia Stable, responsive to opioid therapy and Lyrica. Refills provided and follow-up in one month - LYRICA 150 MG capsule; Take 1 capsule (150 mg total)  by mouth 3 (three) times daily.  Dispense: 90 capsule; Refill: 0 - oxyCODONE-acetaminophen (PERCOCET) 7.5-325 MG tablet; Take 1 tablet by mouth every 8 (eight) hours as needed for severe pain.  Dispense: 90 tablet; Refill: 0  2. Generalized anxiety disorder Stable, responsive to alprazolam, refills provided and follow-up in one month - ALPRAZolam (XANAX) 0.5 MG tablet; Take 1 tablet (0.5 mg total) by mouth 3 (three) times daily as needed for anxiety.  Dispense: 90 tablet; Refill: 0  Samanth Mirkin Asad A. [facility] 02/20/2016 2:07 PM

## 2016-02-27 ENCOUNTER — Emergency Department
Admission: EM | Admit: 2016-02-27 | Discharge: 2016-02-27 | Discharge status: Against Medical Advice | Payer: Medicare Other | Attending: Emergency Medicine | Admitting: Emergency Medicine

## 2016-02-27 DIAGNOSIS — M25571 Pain in right ankle and joints of right foot: Secondary | ICD-10-CM | POA: Diagnosis not present

## 2016-02-27 DIAGNOSIS — M25552 Pain in left hip: Secondary | ICD-10-CM | POA: Insufficient documentation

## 2016-02-27 DIAGNOSIS — M25551 Pain in right hip: Secondary | ICD-10-CM | POA: Insufficient documentation

## 2016-02-27 DIAGNOSIS — M25561 Pain in right knee: Secondary | ICD-10-CM | POA: Diagnosis not present

## 2016-02-27 DIAGNOSIS — M25572 Pain in left ankle and joints of left foot: Secondary | ICD-10-CM | POA: Insufficient documentation

## 2016-02-27 DIAGNOSIS — M25562 Pain in left knee: Secondary | ICD-10-CM | POA: Insufficient documentation

## 2016-02-27 DIAGNOSIS — Z79899 Other long term (current) drug therapy: Secondary | ICD-10-CM | POA: Diagnosis not present

## 2016-02-27 DIAGNOSIS — F17210 Nicotine dependence, cigarettes, uncomplicated: Secondary | ICD-10-CM | POA: Diagnosis not present

## 2016-02-27 DIAGNOSIS — Z5321 Procedure and treatment not carried out due to patient leaving prior to being seen by health care provider: Secondary | ICD-10-CM | POA: Diagnosis not present

## 2016-02-27 NOTE — ED Triage Notes (Addendum)
Pt reports to ED w/ c/o bilateral hip, knee and ankle pain.  Pt sts that she has been unable to walk x 2 days because of pain.  Pt able to move all limbs on command, pt denies injury.  Pt sts that she has not taken prescribed medications x 2 days.  NAD.  Skin tone warm and even.

## 2016-02-27 NOTE — ED Notes (Addendum)
Pt ambulatory with steady gait to stat registration to check on wait time; informed of status in the ED; pt says it's too long to wait and she is going home; ambulatory with steady gait and without difficulty, no limp noted

## 2016-02-28 DIAGNOSIS — F3132 Bipolar disorder, current episode depressed, moderate: Secondary | ICD-10-CM | POA: Diagnosis not present

## 2016-02-28 DIAGNOSIS — F319 Bipolar disorder, unspecified: Secondary | ICD-10-CM | POA: Diagnosis not present

## 2016-02-28 DIAGNOSIS — F4310 Post-traumatic stress disorder, unspecified: Secondary | ICD-10-CM | POA: Diagnosis not present

## 2016-03-06 ENCOUNTER — Encounter: Payer: Self-pay | Admitting: Family Medicine

## 2016-03-06 ENCOUNTER — Ambulatory Visit (INDEPENDENT_AMBULATORY_CARE_PROVIDER_SITE_OTHER): Payer: Medicare Other | Admitting: Family Medicine

## 2016-03-06 VITALS — BP 118/70 | HR 117 | Temp 98.5°F | Resp 13 | Ht 62.0 in | Wt 164.0 lb

## 2016-03-06 DIAGNOSIS — R232 Flushing: Secondary | ICD-10-CM

## 2016-03-06 DIAGNOSIS — M25562 Pain in left knee: Secondary | ICD-10-CM | POA: Diagnosis not present

## 2016-03-06 DIAGNOSIS — N951 Menopausal and female climacteric states: Secondary | ICD-10-CM | POA: Diagnosis not present

## 2016-03-06 DIAGNOSIS — M25561 Pain in right knee: Secondary | ICD-10-CM | POA: Diagnosis not present

## 2016-03-06 DIAGNOSIS — Z0000 Encounter for general adult medical examination without abnormal findings: Secondary | ICD-10-CM | POA: Diagnosis not present

## 2016-03-06 LAB — COMPLETE METABOLIC PANEL WITH GFR
ALT: 10 U/L (ref 6–29)
AST: 11 U/L (ref 10–30)
Albumin: 4 g/dL (ref 3.6–5.1)
Alkaline Phosphatase: 59 U/L (ref 33–115)
BUN: 15 mg/dL (ref 7–25)
CALCIUM: 9.3 mg/dL (ref 8.6–10.2)
CHLORIDE: 109 mmol/L (ref 98–110)
CO2: 27 mmol/L (ref 20–31)
CREATININE: 0.8 mg/dL (ref 0.50–1.10)
GFR, Est Non African American: >89 mL/min (ref >=60)
Glucose, Bld: 83 mg/dL (ref 65–99)
POTASSIUM: 4.2 mmol/L (ref 3.5–5.3)
Sodium: 142 mmol/L (ref 135–146)
Total Bilirubin: 0.4 mg/dL (ref 0.2–1.2)
Total Protein: 6.3 g/dL (ref 6.1–8.1)

## 2016-03-06 LAB — LIPID PANEL
CHOL/HDL RATIO: 4.5 ratio (ref <=5.0)
CHOLESTEROL: 166 mg/dL (ref 125–200)
HDL: 37 mg/dL — AB (ref >=46)
LDL Cholesterol: 92 mg/dL (ref <130)
TRIGLYCERIDES: 187 mg/dL — AB (ref <150)
VLDL: 37 mg/dL — AB (ref <30)

## 2016-03-06 NOTE — Progress Notes (Signed)
Name: Casey Hodges   MRN: [id number]    DOB: 09/14/1975   Date:03/06/2016       Progress Note  Subjective  Chief Complaint  Chief Complaint  Patient presents with  . Annual Exam    CPE    HPI  Pt. Is here for a Complete Physical Exam. She is doing well. Pt. Is requesting referral to Orthopedics for chronic knee and hip pain, mother who accompanies her today is worried about joint stiffness and locking.    Past Medical History:  Diagnosis Date  . Abnormal vaginal Pap smear    10+ years ago- no colpo repeat was normal  . Anxiety   . AR (allergic rhinitis)   . Bipolar affective (HCC)    pt reported  . Bipolar disorder (HCC)   . Eating disorder    Under control per patient  . Fibromyalgia   . GERD (gastroesophageal reflux disease)   . Kidney stone   . Painful intercourse   . Painful menstrual periods   . Pelvic pain in female   . PTSD (post-traumatic stress disorder)   . Renal disorder   . Spinal stenosis   . Uterine polyp   . VWD (acquired von Willebrand's disease) [facility])     Past Surgical History:  Procedure Laterality Date  . ABDOMINAL HYSTERECTOMY    . HYSTEROSCOPY     removed polyps  . LAPAROSCOPIC VAGINAL HYSTERECTOMY  2015   at [facility]  . LAPAROSCOPY Left 01/10/2015   Procedure: LAPAROSCOPY OPERATIVE with biopsy, left oopherectomy;  Surgeon: Herold Harms, MD;  Location: [facility] ORS;  Service: Gynecology;  Laterality: Left;  . LAPAROSCOPY ABDOMEN DIAGNOSTIC    . TUBAL LIGATION      Family History  Problem Relation Age of Onset  . Hypertension Father   . Sleep apnea Mother   . Breast cancer Paternal Grandmother   . Diabetes Maternal Grandmother   . Diabetes Maternal Aunt   . Diabetes Maternal Uncle     Social History   Social History  . Marital status: Widowed    Spouse name: N/A  . Number of children: 1  . Years of education: N/A   Occupational History  . Un-employed due to bipolar    Social History Main Topics  . Smoking status:  Current Every Day Smoker    Packs/day: 0.50    Types: Cigarettes    Start date: 05/18/1995  . Smokeless tobacco: Never Used  . Alcohol use No     Comment: Socially  . Drug use: No  . Sexual activity: No   Other Topics Concern  . Not on file   Social History Narrative   Regular Exercise -  NO   Daily Caffeine Use:  1 cup coffee in am      1 child, one step child          Current Outpatient Prescriptions:  .  ALPRAZolam (XANAX) 0.5 MG tablet, Take 1 tablet (0.5 mg total) by mouth 3 (three) times daily as needed for anxiety., Disp: 90 tablet, Rfl: 0 .  Biotin w/ Vitamins C & E (HAIR SKIN & NAILS GUMMIES) 1250-7.5-7.5 MCG-MG-UNT CHEW, Chew 1 each by mouth daily., Disp: , Rfl:  .  LYRICA 150 MG capsule, Take 1 capsule (150 mg total) by mouth 3 (three) times daily., Disp: 90 capsule, Rfl: 0 .  mupirocin ointment (BACTROBAN) 2 %, Apply to affected area 3 times daily, Disp: 22 g, Rfl: 0 .  mupirocin ointment (BACTROBAN) 2 %,  Apply to affected area 3 times daily, Disp: 22 g, Rfl: 0 .  naloxegol oxalate (MOVANTIK) 25 MG TABS tablet, Take 1 tablet (25 mg total) by mouth daily., Disp: 30 tablet, Rfl: 0 .  ondansetron (ZOFRAN) 4 MG tablet, Take 1 tablet (4 mg total) by mouth daily as needed for nausea or vomiting., Disp: 20 tablet, Rfl: 1 .  ondansetron (ZOFRAN) 4 MG tablet, Take 1 tablet (4 mg total) by mouth daily as needed for nausea or vomiting., Disp: 20 tablet, Rfl: 1 .  oxyCODONE-acetaminophen (PERCOCET) 7.5-325 MG tablet, Take 1 tablet by mouth every 8 (eight) hours as needed for severe pain., Disp: 90 tablet, Rfl: 0 .  promethazine (PHENERGAN) 25 MG tablet, Take 1 tablet (25 mg total) by mouth daily as needed for nausea or vomiting., Disp: 90 tablet, Rfl: 0 .  QUEtiapine (SEROQUEL) 300 MG tablet, , Disp: , Rfl:  .  sulfamethoxazole-trimethoprim (BACTRIM DS) 800-160 MG tablet, Take 1 tablet by mouth 2 (two) times daily., Disp: 20 tablet, Rfl: 0 .  sulfamethoxazole-trimethoprim (BACTRIM  DS) 800-160 MG tablet, Take 1 tablet by mouth 2 (two) times daily., Disp: 20 tablet, Rfl: 0  Allergies  Allergen Reactions  . Wellbutrin [Bupropion Hcl] Other (See Comments)    Reaction:  Suicidal   . Augmentin [Amoxicillin-Pot Clavulanate] Diarrhea, Nausea And Vomiting and Other (See Comments)    Has patient had a PCN reaction causing immediate rash, facial/tongue/throat swelling, SOB or lightheadedness with hypotension: No Has patient had a PCN reaction causing severe rash involving mucus membranes or skin necrosis: No Has patient had a PCN reaction that required hospitalization No Has patient had a PCN reaction occurring within the last 10 years: Yes If all of the above answers are "NO", then may proceed with Cephalosporin use.  Marland Kitchen Penicillins Diarrhea, Nausea And Vomiting and Other (See Comments)    Has patient had a PCN reaction causing immediate rash, facial/tongue/throat swelling, SOB or lightheadedness with hypotension: No Has patient had a PCN reaction causing severe rash involving mucus membranes or skin necrosis: No Has patient had a PCN reaction that required hospitalization No Has patient had a PCN reaction occurring within the last 10 years: Yes If all of the above answers are "NO", then may proceed with Cephalosporin use.  [name]  [Hydrocodone-Ibuprofen] Nausea And Vomiting  . Lasix [Furosemide] Rash  . Nitrofurantoin Monohyd Macro Rash     Review of Systems  Constitutional: Positive for chills and malaise/fatigue. Negative for fever.  Eyes: Negative for blurred vision and double vision.  Respiratory: Negative for cough and shortness of breath.   Cardiovascular: Negative for chest pain and leg swelling.  Gastrointestinal: Positive for heartburn. Negative for abdominal pain, blood in stool, diarrhea, nausea and vomiting.  Genitourinary: Negative for dysuria and hematuria.  Musculoskeletal: Positive for back pain, joint pain and myalgias.  Skin: Negative for itching  and rash.  Neurological: Negative for dizziness, focal weakness and headaches.  Psychiatric/Behavioral: Positive for depression. The patient is nervous/anxious and has insomnia.     Objective  Vitals:   03/06/16 1042  BP: 118/70  Pulse: (!) 117  Resp: 13  Temp: 98.5 F (36.9 C)  TempSrc: Oral  SpO2: 96%  Weight: 164 lb (74.4 kg)  Height: 5\' 2"  (1.575 m)    Physical Exam  Constitutional: She is oriented to person, place, and time and well-developed, well-nourished, and in no distress.  HENT:  Head: Normocephalic and atraumatic.  Cardiovascular: Normal rate, regular rhythm and normal heart sounds.  No murmur heard. Pulmonary/Chest: Effort normal and breath sounds normal. She has no wheezes.  Abdominal: Soft. Bowel sounds are normal. There is no tenderness.  Neurological: She is alert and oriented to person, place, and time.  Skin: Skin is warm and dry.  Psychiatric: Mood, memory, affect and judgment normal.  Nursing note and vitals reviewed.    Assessment & Plan  1. Well woman exam without gynecological exam Obtain age-appropriate laboratory screening - Lipid Profile - COMPLETE METABOLIC PANEL WITH GFR - Vitamin D (25 hydroxy)  2. Hot flashes We'll refer to gynecology for workup for premature menopausal symptoms. - Ambulatory referral to Gynecology  3. Bilateral knee pain  - Ambulatory referral to Orthopedic Surgery   Colby Reels Asad A. Faylene KurtzShah [facility] 03/06/2016 11:03 AM

## 2016-03-07 LAB — VITAMIN D 25 HYDROXY (VIT D DEFICIENCY, FRACTURES): Vit D, 25-Hydroxy: 44 ng/mL (ref 30–100)

## 2016-03-09 DIAGNOSIS — M25552 Pain in left hip: Secondary | ICD-10-CM | POA: Diagnosis not present

## 2016-03-09 DIAGNOSIS — M25551 Pain in right hip: Secondary | ICD-10-CM | POA: Diagnosis not present

## 2016-03-09 DIAGNOSIS — M545 Low back pain: Secondary | ICD-10-CM | POA: Diagnosis not present

## 2016-03-09 DIAGNOSIS — M79659 Pain in unspecified thigh: Secondary | ICD-10-CM | POA: Diagnosis not present

## 2016-03-20 DIAGNOSIS — M545 Low back pain: Secondary | ICD-10-CM | POA: Diagnosis not present

## 2016-03-20 DIAGNOSIS — M222X2 Patellofemoral disorders, left knee: Secondary | ICD-10-CM | POA: Diagnosis not present

## 2016-03-20 DIAGNOSIS — M222X1 Patellofemoral disorders, right knee: Secondary | ICD-10-CM | POA: Diagnosis not present

## 2016-03-20 DIAGNOSIS — M7062 Trochanteric bursitis, left hip: Secondary | ICD-10-CM | POA: Diagnosis not present

## 2016-03-20 DIAGNOSIS — M7061 Trochanteric bursitis, right hip: Secondary | ICD-10-CM | POA: Diagnosis not present

## 2016-03-22 ENCOUNTER — Ambulatory Visit (INDEPENDENT_AMBULATORY_CARE_PROVIDER_SITE_OTHER): Payer: Medicare Other | Admitting: Family Medicine

## 2016-03-22 ENCOUNTER — Encounter: Payer: Self-pay | Admitting: Family Medicine

## 2016-03-22 VITALS — BP 118/74 | HR 127 | Temp 98.2°F | Resp 17 | Ht 62.0 in | Wt 162.9 lb

## 2016-03-22 DIAGNOSIS — F411 Generalized anxiety disorder: Secondary | ICD-10-CM | POA: Diagnosis not present

## 2016-03-22 DIAGNOSIS — M797 Fibromyalgia: Secondary | ICD-10-CM | POA: Diagnosis not present

## 2016-03-22 DIAGNOSIS — R000 Tachycardia, unspecified: Secondary | ICD-10-CM

## 2016-03-22 MED ORDER — OXYCODONE-ACETAMINOPHEN 7.5-325 MG PO TABS: 1.0000 | ORAL_TABLET | Freq: Three times a day (TID) | ORAL | 0 refills | Status: DC | PRN

## 2016-03-22 MED ORDER — ALPRAZOLAM 0.5 MG PO TABS: 0.5000 mg | ORAL_TABLET | Freq: Three times a day (TID) | ORAL | 0 refills | Status: DC | PRN

## 2016-03-22 MED ORDER — LYRICA 150 MG PO CAPS: 150.0000 mg | ORAL_CAPSULE | Freq: Three times a day (TID) | ORAL | 0 refills | Status: DC

## 2016-03-22 NOTE — Progress Notes (Signed)
Name: Casey Hodges   MRN: [id number]    DOB: 04-20-1976   Date:03/22/2016       Progress Note  Subjective  Chief Complaint  Chief Complaint  Patient presents with  . Medication Refill    Anxiety  Presents for follow-up visit. Symptoms include depressed mood (worsening depression.), excessive worry and nervous/anxious behavior. Patient reports no insomnia, irritability, panic or shortness of breath. The severity of symptoms is causing significant distress.     Fibromyalgia: Generalized body aches, worse in hips, knees, and neck and lower back, pain is rated at 6/10,  she is taking Lyrica 150 mg tid and Percocet 7.5-325 mg tid, helps relieve her pain. She is seeing Orthopedics and has started physical therapy twice a week.   Past Medical History:  Diagnosis Date  . Abnormal vaginal Pap smear    10+ years ago- no colpo repeat was normal  . Anxiety   . AR (allergic rhinitis)   . Bipolar affective (HCC)    pt reported  . Bipolar disorder (HCC)   . Eating disorder    Under control per patient  . Fibromyalgia   . GERD (gastroesophageal reflux disease)   . Kidney stone   . Painful intercourse   . Painful menstrual periods   . Pelvic pain in female   . PTSD (post-traumatic stress disorder)   . Renal disorder   . Spinal stenosis   . Uterine polyp   . VWD (acquired von Willebrand's disease) [facility])     Past Surgical History:  Procedure Laterality Date  . ABDOMINAL HYSTERECTOMY    . HYSTEROSCOPY     removed polyps  . LAPAROSCOPIC VAGINAL HYSTERECTOMY  2015   at [facility]  . LAPAROSCOPY Left 01/10/2015   Procedure: LAPAROSCOPY OPERATIVE with biopsy, left oopherectomy;  Surgeon: Herold Harms, MD;  Location: [facility] ORS;  Service: Gynecology;  Laterality: Left;  . LAPAROSCOPY ABDOMEN DIAGNOSTIC    . TUBAL LIGATION      Family History  Problem Relation Age of Onset  . Hypertension Father   . Sleep apnea Mother   . Breast cancer Paternal Grandmother   . Diabetes  Maternal Grandmother   . Diabetes Maternal Aunt   . Diabetes Maternal Uncle     Social History   Social History  . Marital status: Widowed    Spouse name: N/A  . Number of children: 1  . Years of education: N/A   Occupational History  . Un-employed due to bipolar    Social History Main Topics  . Smoking status: Current Every Day Smoker    Packs/day: 0.50    Types: Cigarettes    Start date: 05/18/1995  . Smokeless tobacco: Never Used  . Alcohol use No     Comment: Socially  . Drug use: No  . Sexual activity: No   Other Topics Concern  . Not on file   Social History Narrative   Regular Exercise -  NO   Daily Caffeine Use:  1 cup coffee in am      1 child, one step child           Current Outpatient Prescriptions:  .  ALPRAZolam (XANAX) 0.5 MG tablet, Take 1 tablet (0.5 mg total) by mouth 3 (three) times daily as needed for anxiety., Disp: 90 tablet, Rfl: 0 .  Biotin w/ Vitamins C & E (HAIR SKIN & NAILS GUMMIES) 1250-7.5-7.5 MCG-MG-UNT CHEW, Chew 1 each by mouth daily., Disp: , Rfl:  .  LYRICA 150 MG  capsule, Take 1 capsule (150 mg total) by mouth 3 (three) times daily., Disp: 90 capsule, Rfl: 0 .  mupirocin ointment (BACTROBAN) 2 %, Apply to affected area 3 times daily, Disp: 22 g, Rfl: 0 .  mupirocin ointment (BACTROBAN) 2 %, Apply to affected area 3 times daily, Disp: 22 g, Rfl: 0 .  naloxegol oxalate (MOVANTIK) 25 MG TABS tablet, Take 1 tablet (25 mg total) by mouth daily., Disp: 30 tablet, Rfl: 0 .  ondansetron (ZOFRAN) 4 MG tablet, Take 1 tablet (4 mg total) by mouth daily as needed for nausea or vomiting., Disp: 20 tablet, Rfl: 1 .  ondansetron (ZOFRAN) 4 MG tablet, Take 1 tablet (4 mg total) by mouth daily as needed for nausea or vomiting., Disp: 20 tablet, Rfl: 1 .  oxyCODONE-acetaminophen (PERCOCET) 7.5-325 MG tablet, Take 1 tablet by mouth every 8 (eight) hours as needed for severe pain., Disp: 90 tablet, Rfl: 0 .  promethazine (PHENERGAN) 25 MG tablet, Take  1 tablet (25 mg total) by mouth daily as needed for nausea or vomiting., Disp: 90 tablet, Rfl: 0 .  QUEtiapine (SEROQUEL) 300 MG tablet, , Disp: , Rfl:  .  sulfamethoxazole-trimethoprim (BACTRIM DS) 800-160 MG tablet, Take 1 tablet by mouth 2 (two) times daily., Disp: 20 tablet, Rfl: 0 .  sulfamethoxazole-trimethoprim (BACTRIM DS) 800-160 MG tablet, Take 1 tablet by mouth 2 (two) times daily., Disp: 20 tablet, Rfl: 0  Allergies  Allergen Reactions  . Wellbutrin [Bupropion Hcl] Other (See Comments)    Reaction:  Suicidal   . Augmentin [Amoxicillin-Pot Clavulanate] Diarrhea, Nausea And Vomiting and Other (See Comments)    Has patient had a PCN reaction causing immediate rash, facial/tongue/throat swelling, SOB or lightheadedness with hypotension: No Has patient had a PCN reaction causing severe rash involving mucus membranes or skin necrosis: No Has patient had a PCN reaction that required hospitalization No Has patient had a PCN reaction occurring within the last 10 years: Yes If all of the above answers are "NO", then may proceed with Cephalosporin use.  Marland Kitchen. Penicillins Diarrhea, Nausea And Vomiting and Other (See Comments)    Has patient had a PCN reaction causing immediate rash, facial/tongue/throat swelling, SOB or lightheadedness with hypotension: No Has patient had a PCN reaction causing severe rash involving mucus membranes or skin necrosis: No Has patient had a PCN reaction that required hospitalization No Has patient had a PCN reaction occurring within the last 10 years: Yes If all of the above answers are "NO", then may proceed with Cephalosporin use.  [name]. Vicoprofen  [Hydrocodone-Ibuprofen] Nausea And Vomiting  . Lasix [Furosemide] Rash  . Nitrofurantoin Monohyd Macro Rash     Review of Systems  Constitutional: Negative for irritability.  Respiratory: Negative for shortness of breath.   Psychiatric/Behavioral: The patient is nervous/anxious. The patient does not have insomnia.      Objective  Vitals:   03/22/16 1117  BP: 118/74  Pulse: (!) 127  Resp: 17  Temp: 98.2 F (36.8 C)  TempSrc: Oral  SpO2: 98%  Weight: 162 lb 14.4 oz (73.9 kg)  Height: 5\' 2"  (1.575 m)    Physical Exam  Constitutional: She is oriented to person, place, and time and well-developed, well-nourished, and in no distress.  Cardiovascular: Regular rhythm, S1 normal, S2 normal and normal heart sounds.  Tachycardia present.   No murmur heard. Pulmonary/Chest: Effort normal and breath sounds normal. She has no wheezes.  Musculoskeletal:       Right knee: She exhibits no swelling. No  tenderness found.       Left knee: She exhibits no swelling. No tenderness found.       Cervical back: She exhibits tenderness, pain and spasm.       Lumbar back: She exhibits tenderness, pain and spasm.       Back:  Neurological: She is alert and oriented to person, place, and time.  Psychiatric: Mood, memory, affect and judgment normal.  Nursing note and vitals reviewed.    Assessment & Plan  1. Fibromyalgia Stable, responsive to opioid therapy and Lyrica, compliant with controlled substances agreement. Refills provided - LYRICA 150 MG capsule; Take 1 capsule (150 mg total) by mouth 3 (three) times daily.  Dispense: 90 capsule; Refill: 0 - oxyCODONE-acetaminophen (PERCOCET) 7.5-325 MG tablet; Take 1 tablet by mouth every 8 (eight) hours as needed for severe pain.  Dispense: 90 tablet; Refill: 0  2. Generalized anxiety disorder Worsening, we will continue on alprazolam and patient will follow-up with psychiatrist. - ALPRAZolam (XANAX) 0.5 MG tablet; Take 1 tablet (0.5 mg total) by mouth 3 (three) times daily as needed for anxiety.  Dispense: 90 tablet; Refill: 0  3. Sinus tachycardia (HCC) Heart rate around 100 bpm on auscultation, likely secondary to pain versus anxiety. Patient has seen cardiology in the past   Azhar Yogi Asad [facility] 03/22/2016 11:36 AM

## 2016-03-27 DIAGNOSIS — F3132 Bipolar disorder, current episode depressed, moderate: Secondary | ICD-10-CM | POA: Diagnosis not present

## 2016-03-27 DIAGNOSIS — F319 Bipolar disorder, unspecified: Secondary | ICD-10-CM | POA: Diagnosis not present

## 2016-03-27 DIAGNOSIS — F4310 Post-traumatic stress disorder, unspecified: Secondary | ICD-10-CM | POA: Diagnosis not present

## 2016-03-29 ENCOUNTER — Encounter: Payer: Self-pay | Admitting: Obstetrics and Gynecology

## 2016-03-30 DIAGNOSIS — R112 Nausea with vomiting, unspecified: Secondary | ICD-10-CM | POA: Diagnosis not present

## 2016-03-30 DIAGNOSIS — R197 Diarrhea, unspecified: Secondary | ICD-10-CM | POA: Diagnosis not present

## 2016-04-12 ENCOUNTER — Encounter: Payer: Self-pay | Admitting: *Deleted

## 2016-04-12 ENCOUNTER — Emergency Department: Payer: Medicare Other

## 2016-04-12 ENCOUNTER — Emergency Department
Admission: EM | Admit: 2016-04-12 | Discharge: 2016-04-12 | Disposition: A | Discharge status: Home / Self Care | Payer: Medicare Other | Attending: Emergency Medicine | Admitting: Emergency Medicine

## 2016-04-12 DIAGNOSIS — R42 Dizziness and giddiness: Secondary | ICD-10-CM | POA: Diagnosis not present

## 2016-04-12 DIAGNOSIS — F17210 Nicotine dependence, cigarettes, uncomplicated: Secondary | ICD-10-CM | POA: Insufficient documentation

## 2016-04-12 DIAGNOSIS — R197 Diarrhea, unspecified: Secondary | ICD-10-CM | POA: Diagnosis not present

## 2016-04-12 DIAGNOSIS — R002 Palpitations: Secondary | ICD-10-CM | POA: Diagnosis not present

## 2016-04-12 DIAGNOSIS — R112 Nausea with vomiting, unspecified: Secondary | ICD-10-CM | POA: Diagnosis not present

## 2016-04-12 DIAGNOSIS — Z79899 Other long term (current) drug therapy: Secondary | ICD-10-CM | POA: Insufficient documentation

## 2016-04-12 DIAGNOSIS — R1013 Epigastric pain: Secondary | ICD-10-CM | POA: Diagnosis not present

## 2016-04-12 DIAGNOSIS — R1031 Right lower quadrant pain: Secondary | ICD-10-CM | POA: Diagnosis not present

## 2016-04-12 DIAGNOSIS — N3000 Acute cystitis without hematuria: Secondary | ICD-10-CM

## 2016-04-12 LAB — COMPREHENSIVE METABOLIC PANEL
ALBUMIN: 4.4 g/dL (ref 3.5–5.0)
ALK PHOS: 80 U/L (ref 38–126)
ALT: 15 U/L (ref 14–54)
ANION GAP: 11 (ref 5–15)
AST: 17 U/L (ref 15–41)
BUN: 9 mg/dL (ref 6–20)
CHLORIDE: 106 mmol/L (ref 101–111)
CO2: 17 mmol/L — AB (ref 22–32)
Calcium: 10.4 mg/dL — ABNORMAL HIGH (ref 8.9–10.3)
Creatinine, Ser: 0.65 mg/dL (ref 0.44–1.00)
GFR calc non Af Amer: >60 mL/min (ref >60)
GLUCOSE: 95 mg/dL (ref 65–99)
Potassium: 3.9 mmol/L (ref 3.5–5.1)
SODIUM: 134 mmol/L — AB (ref 135–145)
Total Bilirubin: 0.7 mg/dL (ref 0.3–1.2)
Total Protein: 8.6 g/dL — ABNORMAL HIGH (ref 6.5–8.1)

## 2016-04-12 LAB — URINALYSIS COMPLETE WITH MICROSCOPIC (ARMC ONLY)
Bilirubin Urine: NEGATIVE
Glucose, UA: NEGATIVE mg/dL
Leukocytes, UA: NEGATIVE
Nitrite: NEGATIVE
Protein, ur: 30 mg/dL — AB
Specific Gravity, Urine: 1.016 (ref 1.005–1.030)
pH: 7 (ref 5.0–8.0)

## 2016-04-12 LAB — CBC
HEMATOCRIT: 47.1 % — AB (ref 35.0–47.0)
HEMOGLOBIN: 16.2 g/dL — AB (ref 12.0–16.0)
MCH: 30.2 pg (ref 26.0–34.0)
MCHC: 34.4 g/dL (ref 32.0–36.0)
MCV: 87.9 fL (ref 80.0–100.0)
Platelets: 328 10*3/uL (ref 150–440)
RBC: 5.36 MIL/uL — AB (ref 3.80–5.20)
RDW: 13.6 % (ref 11.5–14.5)
WBC: 10.3 10*3/uL (ref 3.6–11.0)

## 2016-04-12 LAB — LIPASE, BLOOD: LIPASE: 27 U/L (ref 11–51)

## 2016-04-12 MED ORDER — LORAZEPAM 2 MG/ML IJ SOLN
0.5000 mg | Freq: Once | INTRAMUSCULAR | Status: AC
  Administered 2016-04-12: 0.5 mg via INTRAVENOUS
  Filled 2016-04-12: qty 1

## 2016-04-12 MED ORDER — SODIUM CHLORIDE 0.9 % IV BOLUS (SEPSIS)
1000.0000 mL | Freq: Once | INTRAVENOUS | Status: AC
Start: 2016-04-12 — End: 2016-04-12
  Administered 2016-04-12: 1000 mL via INTRAVENOUS

## 2016-04-12 MED ORDER — CEFTRIAXONE SODIUM 1 G IJ SOLR: 1.0000 g | Freq: Once | INTRAMUSCULAR | Status: DC

## 2016-04-12 MED ORDER — IOPAMIDOL (ISOVUE-300) INJECTION 61%
30.0000 mL | Freq: Once | INTRAVENOUS | Status: AC | PRN
  Administered 2016-04-12: 30 mL via ORAL

## 2016-04-12 MED ORDER — HYDROMORPHONE HCL 1 MG/ML IJ SOLN
0.5000 mg | Freq: Once | INTRAMUSCULAR | Status: AC
  Administered 2016-04-12: 0.5 mg via INTRAVENOUS
  Filled 2016-04-12: qty 1

## 2016-04-12 MED ORDER — LOPERAMIDE HCL 2 MG PO TABS: 2.0000 mg | ORAL_TABLET | Freq: Four times a day (QID) | ORAL | 0 refills | Status: DC | PRN

## 2016-04-12 MED ORDER — ONDANSETRON HCL 4 MG/2ML IJ SOLN
4.0000 mg | Freq: Once | INTRAMUSCULAR | Status: AC | PRN
  Administered 2016-04-12: 4 mg via INTRAVENOUS
  Filled 2016-04-12: qty 2

## 2016-04-12 MED ORDER — CEFTRIAXONE SODIUM-DEXTROSE 1-3.74 GM-% IV SOLR
1.0000 g | Freq: Once | INTRAVENOUS | Status: DC
  Filled 2016-04-12: qty 50

## 2016-04-12 MED ORDER — IOPAMIDOL (ISOVUE-300) INJECTION 61%
100.0000 mL | Freq: Once | INTRAVENOUS | Status: AC | PRN
  Administered 2016-04-12: 100 mL via INTRAVENOUS

## 2016-04-12 MED ORDER — ONDANSETRON HCL 4 MG/2ML IJ SOLN
4.0000 mg | Freq: Once | INTRAMUSCULAR | Status: AC
  Administered 2016-04-12: 4 mg via INTRAVENOUS
  Filled 2016-04-12: qty 2

## 2016-04-12 MED ORDER — ONDANSETRON 4 MG PO TBDP: 4.0000 mg | ORAL_TABLET | Freq: Three times a day (TID) | ORAL | 0 refills | Status: DC | PRN

## 2016-04-12 MED ORDER — SODIUM CHLORIDE 0.9 % IV BOLUS (SEPSIS): 1000.0000 mL | Freq: Once | INTRAVENOUS | Status: DC

## 2016-04-12 MED ORDER — CEPHALEXIN 500 MG PO CAPS: 500.0000 mg | ORAL_CAPSULE | Freq: Four times a day (QID) | ORAL | 0 refills | Status: AC

## 2016-04-12 NOTE — Discharge Instructions (Signed)
Please drink plenty of fluid to stay well-hydrated. You may take Zofran for nausea and loperamide for diarrhea.  Please take the entire course of antibiotics, even if you're feeling better.  Please practice frequent and good handwashing to prevent the spread of infection.  Return to the emergency department if you develop severe pain, fever, inability to keep down fluids, lightheadedness or fainting, or any other symptoms concerning to you.

## 2016-04-12 NOTE — ED Provider Notes (Signed)
[facility] Emergency Department Provider Note  ____________________________________________  Time seen: Approximately 5:56 PM  I have reviewed the triage vital signs and the nursing notes.   HISTORY  Chief Complaint Emesis and Diarrhea    HPI Casey Hodges is a 40 y.o. female s/p hysterectomy, BTL and L oophorectomy, with a history of renal colic, fibromyalgia, bipolar disorder, presenting with nausea vomiting and diarrhea as well as epigastric and right lower quadrant pain. The patient reports that for the past 2 weeks she has had 5-6 daily episodes of watery nonbloody loose stool associated with nausea and vomiting. She has tried Phenergan but is unable to keep it down. She has not had any fever or chills. She has had epigastric and right lower quadrant pain.Now, the patient reports that she is having lightheadedness with standing without any syncope.   Past Medical History:  Diagnosis Date  . Abnormal vaginal Pap smear    10+ years ago- no colpo repeat was normal  . Anxiety   . AR (allergic rhinitis)   . Bipolar affective (HCC)    pt reported  . Bipolar disorder (HCC)   . Eating disorder    Under control per patient  . Fibromyalgia   . GERD (gastroesophageal reflux disease)   . Kidney stone   . Painful intercourse   . Painful menstrual periods   . Pelvic pain in female   . PTSD (post-traumatic stress disorder)   . Renal disorder   . Spinal stenosis   . Uterine polyp   . VWD (acquired von Willebrand's disease) [facility])     Patient Active Problem List   Diagnosis Date Noted  . Well woman exam without gynecological exam 03/06/2016  . Acute pain of left hip 12/07/2015  . Cellulitis of head or scalp 11/16/2015  . Constipation due to opioid therapy 11/04/2015  . Cellulitis of face 09/21/2015  . Severe episode of recurrent major depressive disorder, without psychotic features (HCC)   . GERD (gastroesophageal reflux disease) 09/16/2015  . Sepsis  (HCC) 09/16/2015  . Facial cellulitis 09/16/2015  . Cervical spine pain 09/08/2015  . Smoker 07/25/2015  . Upper respiratory infection 07/12/2015  . Snoring 06/09/2015  . Cardiac murmur 05/25/2015  . Cloudy urine 05/25/2015  . Numbness of foot 04/20/2015  . Drug-induced nausea and vomiting 03/22/2015  . Need for immunization against influenza 03/21/2015  . Cyst, bone 02/14/2015  . Pelvic adhesive disease 01/21/2015  . Tick bite 01/20/2015  . Arthritis 12/19/2014  . Calculus of kidney 12/13/2014  . Patchy loss of hair 12/13/2014  . Anxiety disorder 12/13/2014  . Postural dizziness 12/13/2014  . Subcutaneous cyst 12/13/2014  . Nephrolithiasis 05/23/2012  . Chronic pelvic pain in female 05/23/2012  . Fibromyalgia 05/23/2012  . Bipolar disorder with depression (HCC) 06/05/2011    Past Surgical History:  Procedure Laterality Date  . ABDOMINAL HYSTERECTOMY    . HYSTEROSCOPY     removed polyps  . LAPAROSCOPIC VAGINAL HYSTERECTOMY  2015   at [facility]  . LAPAROSCOPY Left 01/10/2015   Procedure: LAPAROSCOPY OPERATIVE with biopsy, left oopherectomy;  Surgeon: Herold Harms, MD;  Location: [facility] ORS;  Service: Gynecology;  Laterality: Left;  . LAPAROSCOPY ABDOMEN DIAGNOSTIC    . TUBAL LIGATION      Current Outpatient Rx  . Order #: [id number] Class: Print  . Order #: [id number] Class: Historical Med  . Order #: [id number] Class: Historical Med  . Order #: [id number] Class: Historical Med  . Order #: [id number] Class: Print  . Order #:  [id number] Class: Print  . Order #: [id number] Class: Historical Med  . Order #: [id number] Class: Historical Med  . Order #: [id number] Class: Historical Med  . Order #: [id number] Class: Print  . Order #: [id number] Class: Print  . Order #: [id number] Class: Print    Allergies Wellbutrin [bupropion hcl]; Augmentin [amoxicillin-pot clavulanate]; Penicillins; Vicoprofen  [hydrocodone-ibuprofen]; Lasix [furosemide]; and Nitrofurantoin monohyd macro  Family  History  Problem Relation Age of Onset  . Hypertension Father   . Sleep apnea Mother   . Breast cancer Paternal Grandmother   . Diabetes Maternal Grandmother   . Diabetes Maternal Aunt   . Diabetes Maternal Uncle     Social History Social History  Substance Use Topics  . Smoking status: Current Every Day Smoker    Packs/day: 0.50    Types: Cigarettes    Start date: 05/18/1995  . Smokeless tobacco: Never Used  . Alcohol use No     Comment: Socially    Review of Systems Constitutional: No fever/chills.Positive lightheadedness without syncope. Eyes: No visual changes. ENT: No sore throat. No congestion or rhinorrhea. Cardiovascular: Denies chest pain. Denies palpitations. Respiratory: Denies shortness of breath.  No cough. Gastrointestinal: Positive epigastric and right lower quadrant abdominal pain.  Positive nausea, positive vomiting.  Positive diarrhea.  No constipation. Genitourinary: Negative for dysuria. No change in vaginal discharge. Musculoskeletal: Negative for back pain. Skin: Negative for rash. Neurological: Negative for headaches. No focal numbness, tingling or weakness.   10-point ROS otherwise negative.  ____________________________________________   PHYSICAL EXAM:  VITAL SIGNS: ED Triage Vitals [04/12/16 1516]  Enc Vitals Group     BP      Pulse      Resp      Temp      Temp src      SpO2      Weight 162 lb (73.5 kg)     Height 5\' 2"  (1.575 m)     Head Circumference      Peak Flow      Pain Score 6     Pain Loc      Pain Edu?      Excl. in GC?     Constitutional: Alert and oriented. Well appearing and in no acute distress. Answers questions appropriately. Eyes: Conjunctivae are normal.  EOMI. No scleral icterus. Head: Atraumatic. Nose: No congestion/rhinnorhea. Mouth/Throat: Mucous membranes are moist.  Neck: No stridor.  Supple.  No JVD. No meningismus. Cardiovascular: Normal rate, regular rhythm. No murmurs, rubs or gallops.   Respiratory: Normal respiratory effort.  No accessory muscle use or retractions. Lungs CTAB.  No wheezes, rales or ronchi. Gastrointestinal: Soft and nondistended.  Tender to palpation in the epigastrium and right lower quadrant. No right upper quadrant pain, or Murphy sign. No guarding or rebound.  No peritoneal signs. Musculoskeletal: No LE edema.  Neurologic:  A&Ox3.  Speech is clear.  Face and smile are symmetric.  EOMI.  Moves all extremities well. Skin:  Skin is warm, dry and intact. No rash noted. Psychiatric: Mood and affect are normal. Speech and behavior are normal.  Normal judgement.  ____________________________________________   LABS (all labs ordered are listed, but only abnormal results are displayed)  Labs Reviewed  COMPREHENSIVE METABOLIC PANEL - Abnormal; Notable for the following:       Result Value   Sodium 134 (*)    CO2 17 (*)    Calcium 10.4 (*)    Total Protein 8.6 (*)    All other components within normal limits  CBC - Abnormal;  Notable for the following:    RBC 5.36 (*)    Hemoglobin 16.2 (*)    HCT 47.1 (*)    All other components within normal limits  URINALYSIS COMPLETEWITH MICROSCOPIC ([facility] ONLY) - Abnormal; Notable for the following:    Color, Urine YELLOW (*)    APPearance HAZY (*)    Ketones, ur 2+ (*)    Hgb urine dipstick 2+ (*)    Protein, ur 30 (*)    Bacteria, UA MANY (*)    Squamous Epithelial / LPF 6-30 (*)    All other components within normal limits  URINE CULTURE  LIPASE, BLOOD   ____________________________________________  EKG  ED ECG REPORT I, Rockne MenghiniNorman, Anne-Caroline, the attending physician, personally viewed and interpreted this ECG.   Date: 04/12/2016  EKG Time: 1520  Rate: 91  Rhythm: normal sinus rhythm  Axis: normal  Intervals:none  ST&T Change: Nonspecific T-wave inversion in V1. No ST elevation.  ____________________________________________  RADIOLOGY  Ct Abdomen Pelvis W Contrast  Result Date:  04/12/2016 CLINICAL DATA:  Right lower quadrant pain with vomiting and diarrhea for 2 weeks. EXAM: CT ABDOMEN AND PELVIS WITH CONTRAST TECHNIQUE: Multidetector CT imaging of the abdomen and pelvis was performed using the standard protocol following bolus administration of intravenous contrast. CONTRAST:  100mL ISOVUE-300 IOPAMIDOL (ISOVUE-300) INJECTION 61% COMPARISON:  05/26/2014 FINDINGS: Lower chest:  Compressive atelectasis noted lower lungs bilaterally. Hepatobiliary: No focal abnormality within the liver parenchyma. There is no evidence for gallstones, gallbladder wall thickening, or pericholecystic fluid. No intrahepatic or extrahepatic biliary dilation. Pancreas: No focal mass lesion. No dilatation of the main duct. No intraparenchymal cyst. No peripancreatic edema. Spleen: No splenomegaly. No focal mass lesion. Adrenals/Urinary Tract: No adrenal nodule or mass. Right kidney unremarkable. 2 mm nonobstructing stone identified lower pole left kidney. No evidence for hydroureter. The urinary bladder appears normal for the degree of distention. Stomach/Bowel: Stomach is distended with contrast material. Duodenum is normally positioned as is the ligament of Treitz. No small bowel wall thickening. No small bowel dilatation. The terminal ileum is normal. The appendix is normal. No gross colonic mass. No colonic wall thickening. No substantial diverticular change. Vascular/Lymphatic: No abdominal aortic aneurysm. No abdominal aortic atherosclerotic calcification. There is no gastrohepatic or hepatoduodenal ligament lymphadenopathy. No intraperitoneal or retroperitoneal lymphadenopathy. No pelvic sidewall lymphadenopathy. Reproductive: Uterus is surgically absent. Involuting cyst or follicle noted right ovary. Left ovary not clearly visualized. Other: No intraperitoneal free fluid. Musculoskeletal: Bone windows reveal no worrisome lytic or sclerotic osseous lesions. IMPRESSION: 1. No CT findings to explain the  patient's history of right lower quadrant pain with vomiting and diarrhea. 2. 2 mm nonobstructing left renal stone. Electronically Signed   By: Kennith CenterEric  Mansell M.D.   On: 04/12/2016 19:11    ____________________________________________   PROCEDURES  Procedure(s) performed: None  Procedures  Critical Care performed: No ____________________________________________   INITIAL IMPRESSION / ASSESSMENT AND PLAN / ED COURSE  Pertinent labs & imaging results that were available during my care of the patient were reviewed by me and considered in my medical decision making (see chart for details).  40 y.o. female, nonpregnant status as/P hysterectomy, presenting with 2 weeks of nausea vomiting and diarrhea with epigastric and right lower quadrant pain. It is possible and likely that the patient has a viral GI illness. I would also consider appendicitis will get CT scan to evaluate for this. The patient's urine may be suggestive of a mild UTI although she has no nitrites, or leukocyte esterace, and  there are squames of the urine is contaminated. I will give her a dose of Rocephin and plan to treat her with oral antibiotics if the rest of her workup is reassuring. Although clinically she is not significantly dehydrated, her blood work does appear hemoconcentrated, and she does have ketonuria, so we will aggressively intravenously and orally rehydrate her.  ----------------------------------------- 7:22 PM on 04/12/2016 -----------------------------------------  The patient has received antibiotics for her urinary tract infection. In addition, her CT scan does not show any acute process. I will treat her for her symptoms of nausea, vomiting and diarrhea. The patient was not definitively orthostatic on examination but she did have some mild elevation of her heart rate was standing, so have encouraged her to be aggressive about oral hydration. At this time, the patient is safe for discharge. She understands  return precautions as well as follow-up instructions. ____________________________________________  FINAL CLINICAL IMPRESSION(S) / ED DIAGNOSES  Final diagnoses:  Nausea vomiting and diarrhea  Epigastric pain  Right lower quadrant pain  Palpitations  Postural lightheadedness  Acute cystitis without hematuria    Clinical Course      NEW MEDICATIONS STARTED DURING THIS VISIT:  New Prescriptions   CEPHALEXIN (KEFLEX) 500 MG CAPSULE    Take 1 capsule (500 mg total) by mouth 4 (four) times daily.   LOPERAMIDE (IMODIUM A-D) 2 MG TABLET    Take 1 tablet (2 mg total) by mouth 4 (four) times daily as needed for diarrhea or loose stools.   ONDANSETRON (ZOFRAN ODT) 4 MG DISINTEGRATING TABLET    Take 1 tablet (4 mg total) by mouth every 8 (eight) hours as needed for nausea or vomiting.      Rockne Menghini, MD 04/12/16 [phone]

## 2016-04-12 NOTE — ED Notes (Signed)
Diarrhea, emesis x 2 weeks.

## 2016-04-12 NOTE — ED Notes (Signed)
Pt ambulated to exit with steady gait. Able to hold down fluids with no problem

## 2016-04-12 NOTE — ED Triage Notes (Signed)
States vomiting and diarrhea for 2 weeks, states feeling dizzy the past few days and some heart racing, states body aches, pt awake and alert in no acute distress

## 2016-04-15 LAB — URINE CULTURE: Culture: 30000 — AB

## 2016-04-23 ENCOUNTER — Encounter: Payer: Self-pay | Admitting: Family Medicine

## 2016-04-23 ENCOUNTER — Ambulatory Visit (INDEPENDENT_AMBULATORY_CARE_PROVIDER_SITE_OTHER): Payer: Medicare Other | Admitting: Family Medicine

## 2016-04-23 VITALS — BP 124/70 | HR 110 | Temp 98.7°F | Resp 16 | Ht 62.0 in | Wt 158.8 lb

## 2016-04-23 DIAGNOSIS — Z23 Encounter for immunization: Secondary | ICD-10-CM | POA: Diagnosis not present

## 2016-04-23 DIAGNOSIS — M797 Fibromyalgia: Secondary | ICD-10-CM | POA: Diagnosis not present

## 2016-04-23 DIAGNOSIS — F411 Generalized anxiety disorder: Secondary | ICD-10-CM | POA: Diagnosis not present

## 2016-04-23 MED ORDER — ALPRAZOLAM 0.5 MG PO TABS: 0.5000 mg | ORAL_TABLET | Freq: Three times a day (TID) | ORAL | 0 refills | Status: DC | PRN

## 2016-04-23 MED ORDER — LYRICA 150 MG PO CAPS: 150.0000 mg | ORAL_CAPSULE | Freq: Three times a day (TID) | ORAL | 0 refills | Status: DC

## 2016-04-23 MED ORDER — OXYCODONE-ACETAMINOPHEN 7.5-325 MG PO TABS: 1.0000 | ORAL_TABLET | Freq: Three times a day (TID) | ORAL | 0 refills | Status: DC | PRN

## 2016-04-23 NOTE — Progress Notes (Signed)
Name: Casey Hodges   MRN: [id number]    DOB: 1976/02/20   Date:04/23/2016       Progress Note  Subjective  Chief Complaint  Chief Complaint  Patient presents with  . Follow-up    medication refills    HPI  Fibromyalgia: Symptoms include chronic generalized pain specifically in knees, hips, and back, recently experiencing a lot of pain in her neck, especially on left side. She takes Oxycodone-Acetaminophen 7.5-325 mg every 8 hours as needed, and Lyrica 150 mg three times daily which helps relieve her pain. No side effects reported.  Anxiety: Pt. Reports chronic anxiety, unchanged from last month, she is now seeing a therapist who started her on Cymbalta for depression. She takes Alprazolam 0.5 mg three times daily as needed.    Past Medical History:  Diagnosis Date  . Abnormal vaginal Pap smear    10+ years ago- no colpo repeat was normal  . Anxiety   . AR (allergic rhinitis)   . Bipolar affective (HCC)    pt reported  . Bipolar disorder (HCC)   . Eating disorder    Under control per patient  . Fibromyalgia   . GERD (gastroesophageal reflux disease)   . Kidney stone   . Painful intercourse   . Painful menstrual periods   . Pelvic pain in female   . PTSD (post-traumatic stress disorder)   . Renal disorder   . Spinal stenosis   . Uterine polyp   . VWD (acquired von Willebrand's disease) [facility])     Past Surgical History:  Procedure Laterality Date  . ABDOMINAL HYSTERECTOMY    . HYSTEROSCOPY     removed polyps  . LAPAROSCOPIC VAGINAL HYSTERECTOMY  2015   at [facility]  . LAPAROSCOPY Left 01/10/2015   Procedure: LAPAROSCOPY OPERATIVE with biopsy, left oopherectomy;  Surgeon: Herold Harms, MD;  Location: [facility] ORS;  Service: Gynecology;  Laterality: Left;  . LAPAROSCOPY ABDOMEN DIAGNOSTIC    . TUBAL LIGATION      Family History  Problem Relation Age of Onset  . Hypertension Father   . Sleep apnea Mother   . Breast cancer Paternal Grandmother   . Diabetes  Maternal Grandmother   . Diabetes Maternal Aunt   . Diabetes Maternal Uncle     Social History   Social History  . Marital status: Widowed    Spouse name: N/A  . Number of children: 1  . Years of education: N/A   Occupational History  . Un-employed due to bipolar    Social History Main Topics  . Smoking status: Current Every Day Smoker    Packs/day: 0.50    Types: Cigarettes    Start date: 05/18/1995  . Smokeless tobacco: Never Used  . Alcohol use No     Comment: Socially  . Drug use: No  . Sexual activity: No   Other Topics Concern  . Not on file   Social History Narrative   Regular Exercise -  NO   Daily Caffeine Use:  1 cup coffee in am      1 child, one step child           Current Outpatient Prescriptions:  .  ALPRAZolam (XANAX) 0.5 MG tablet, Take 1 tablet (0.5 mg total) by mouth 3 (three) times daily as needed for anxiety., Disp: 90 tablet, Rfl: 0 .  ALPRAZolam (XANAX) 1 MG tablet, Take 1 tablet by mouth 2 (two) times daily., Disp: , Rfl:  .  Biotin w/ Vitamins C &  E (HAIR SKIN & NAILS GUMMIES) 1250-7.5-7.5 MCG-MG-UNT CHEW, Chew 1 each by mouth daily., Disp: , Rfl:  .  DULoxetine (CYMBALTA) 30 MG capsule, Take 30 mg by mouth daily., Disp: , Rfl:  .  loperamide (IMODIUM A-D) 2 MG tablet, Take 1 tablet (2 mg total) by mouth 4 (four) times daily as needed for diarrhea or loose stools., Disp: 12 tablet, Rfl: 0 .  LYRICA 150 MG capsule, Take 1 capsule (150 mg total) by mouth 3 (three) times daily., Disp: 90 capsule, Rfl: 0 .  mupirocin ointment (BACTROBAN) 2 %, Apply to affected area 3 times daily, Disp: 22 g, Rfl: 0 .  ondansetron (ZOFRAN ODT) 4 MG disintegrating tablet, Take 1 tablet (4 mg total) by mouth every 8 (eight) hours as needed for nausea or vomiting., Disp: 20 tablet, Rfl: 0 .  oxyCODONE-acetaminophen (PERCOCET) 7.5-325 MG tablet, Take 1 tablet by mouth every 8 (eight) hours as needed., Disp: , Rfl:  .  QUEtiapine (SEROQUEL) 300 MG tablet, Take 300 mg  by mouth at bedtime. , Disp: , Rfl:  .  zolpidem (AMBIEN) 10 MG tablet, Take 1 tablet by mouth at bedtime., Disp: , Rfl:   Allergies  Allergen Reactions  . Wellbutrin [Bupropion Hcl] Other (See Comments)    Reaction:  Suicidal   . Augmentin [Amoxicillin-Pot Clavulanate] Diarrhea, Nausea And Vomiting and Other (See Comments)    Has patient had a PCN reaction causing immediate rash, facial/tongue/throat swelling, SOB or lightheadedness with hypotension: No Has patient had a PCN reaction causing severe rash involving mucus membranes or skin necrosis: No Has patient had a PCN reaction that required hospitalization No Has patient had a PCN reaction occurring within the last 10 years: Yes If all of the above answers are "NO", then may proceed with Cephalosporin use.  Marland Kitchen. Penicillins Diarrhea, Nausea And Vomiting and Other (See Comments)    Has patient had a PCN reaction causing immediate rash, facial/tongue/throat swelling, SOB or lightheadedness with hypotension: No Has patient had a PCN reaction causing severe rash involving mucus membranes or skin necrosis: No Has patient had a PCN reaction that required hospitalization No Has patient had a PCN reaction occurring within the last 10 years: Yes If all of the above answers are "NO", then may proceed with Cephalosporin use.  [name]. Vicoprofen  [Hydrocodone-Ibuprofen] Nausea And Vomiting  . Lasix [Furosemide] Rash  . Nitrofurantoin Monohyd Macro Rash     Review of Systems  Constitutional: Positive for malaise/fatigue. Negative for chills and fever.  Eyes: Negative for blurred vision and double vision.  Cardiovascular: Negative for chest pain.  Gastrointestinal: Positive for abdominal pain (intermittent RLQ abdominal pain).  Musculoskeletal: Positive for back pain, joint pain and neck pain.  Psychiatric/Behavioral: Positive for depression. The patient is nervous/anxious and has insomnia.       Objective  Vitals:   04/23/16 1009  BP: 124/70    Pulse: (!) 110  Resp: 16  Temp: 98.7 F (37.1 C)  TempSrc: Oral  SpO2: 97%  Weight: 158 lb 12.8 oz (72 kg)  Height: 5\' 2"  (1.575 m)    Physical Exam  Constitutional: She is oriented to person, place, and time and well-developed, well-nourished, and in no distress.  HENT:  Head: Normocephalic and atraumatic.  Cardiovascular: Normal rate, regular rhythm, S1 normal, S2 normal and normal heart sounds.   No murmur heard. Pulmonary/Chest: Effort normal and breath sounds normal. She has no wheezes. She has no rhonchi.  Musculoskeletal:       Right hip:  She exhibits tenderness.       Left hip: She exhibits tenderness.       Right knee: She exhibits no swelling. Tenderness found.       Left knee: She exhibits no swelling. Tenderness found.       Cervical back: She exhibits tenderness.       Legs: Tenderness over the right lateral hips Tenderness to palpation over the left neck area.  Neurological: She is alert and oriented to person, place, and time.  Psychiatric: Mood, memory, affect and judgment normal.  Nursing note and vitals reviewed.     Assessment & Plan  1. Generalized anxiety disorder Stable and responsive to alprazolam taken up to 3 times daily as needed. Patient is also being seen by psychiatry. Refills provided - ALPRAZolam (XANAX) 0.5 MG tablet; Take 1 tablet (0.5 mg total) by mouth 3 (three) times daily as needed for anxiety.  Dispense: 90 tablet; Refill: 0  2. Fibromyalgia Stable and responsive to opioid therapy and Lyrica. Patient compliant with controlled substances agreement and understands the dependence potential, side effects and drug interactions of opioids. Refills provided follow-up in one month - oxyCODONE-acetaminophen (PERCOCET) 7.5-325 MG tablet; Take 1 tablet by mouth every 8 (eight) hours as needed.  Dispense: 90 tablet; Refill: 0 - LYRICA 150 MG capsule; Take 1 capsule (150 mg total) by mouth 3 (three) times daily.  Dispense: 90 capsule; Refill:  0   3. Need for influenza vaccination  - Flu Vaccine QUAD 36+ mos PF IM (Fluarix & Fluzone Quad PF)   Nikolaus Pienta Asad A. Faylene KurtzShah [facility] 04/23/2016 10:21 AM

## 2016-04-25 DIAGNOSIS — F4310 Post-traumatic stress disorder, unspecified: Secondary | ICD-10-CM | POA: Diagnosis not present

## 2016-04-25 DIAGNOSIS — F3181 Bipolar II disorder: Secondary | ICD-10-CM | POA: Diagnosis not present

## 2016-04-25 DIAGNOSIS — F633 Trichotillomania: Secondary | ICD-10-CM | POA: Diagnosis not present

## 2016-04-25 DIAGNOSIS — F3132 Bipolar disorder, current episode depressed, moderate: Secondary | ICD-10-CM | POA: Diagnosis not present

## 2016-04-25 DIAGNOSIS — F319 Bipolar disorder, unspecified: Secondary | ICD-10-CM | POA: Diagnosis not present

## 2016-05-24 ENCOUNTER — Ambulatory Visit (INDEPENDENT_AMBULATORY_CARE_PROVIDER_SITE_OTHER): Payer: Medicare Other | Admitting: Family Medicine

## 2016-05-24 ENCOUNTER — Encounter: Payer: Self-pay | Admitting: Family Medicine

## 2016-05-24 VITALS — BP 110/64 | HR 100 | Temp 98.1°F | Resp 16 | Ht 62.0 in | Wt 158.2 lb

## 2016-05-24 DIAGNOSIS — F319 Bipolar disorder, unspecified: Secondary | ICD-10-CM | POA: Diagnosis not present

## 2016-05-24 DIAGNOSIS — M797 Fibromyalgia: Secondary | ICD-10-CM | POA: Diagnosis not present

## 2016-05-24 DIAGNOSIS — F3130 Bipolar disorder, current episode depressed, mild or moderate severity, unspecified: Secondary | ICD-10-CM | POA: Diagnosis not present

## 2016-05-24 DIAGNOSIS — F4310 Post-traumatic stress disorder, unspecified: Secondary | ICD-10-CM | POA: Diagnosis not present

## 2016-05-24 DIAGNOSIS — F411 Generalized anxiety disorder: Secondary | ICD-10-CM

## 2016-05-24 DIAGNOSIS — F3132 Bipolar disorder, current episode depressed, moderate: Secondary | ICD-10-CM | POA: Diagnosis not present

## 2016-05-24 MED ORDER — ALPRAZOLAM 0.5 MG PO TABS: 0.5000 mg | ORAL_TABLET | Freq: Three times a day (TID) | ORAL | 0 refills | Status: DC | PRN

## 2016-05-24 MED ORDER — OXYCODONE-ACETAMINOPHEN 7.5-325 MG PO TABS: 1.0000 | ORAL_TABLET | Freq: Three times a day (TID) | ORAL | 0 refills | Status: DC | PRN

## 2016-05-24 MED ORDER — LYRICA 150 MG PO CAPS: 150.0000 mg | ORAL_CAPSULE | Freq: Three times a day (TID) | ORAL | 0 refills | Status: DC

## 2016-05-24 NOTE — Progress Notes (Signed)
Name: Casey Hodges   MRN: [id number]    DOB: 10/15/1975   Date:05/24/2016       Progress Note  Subjective  Chief Complaint  Chief Complaint  Patient presents with  . Follow-up    Anxiety  Presents for follow-up visit. Symptoms include depressed mood, excessive worry, insomnia, irritability, nervous/anxious behavior, palpitations and restlessness. Patient reports no panic. Symptoms occur constantly. The severity of symptoms is moderate and causing significant distress. The quality of sleep is fair.     Fibromyalgia: Symptoms include chronic generalized pain specifically in knees, hips, neck, and back. Pain is worse in shoulders and upper neck especially with worsening anxiety.  She takes Oxycodone-Acetaminophen 7.5-325 mg every 8 hours as needed, and Lyrica 150 mg three times daily which helps relieve her pain. No side effects reported.   Past Medical History:  Diagnosis Date  . Abnormal vaginal Pap smear    10+ years ago- no colpo repeat was normal  . Anxiety   . AR (allergic rhinitis)   . Bipolar affective (HCC)    pt reported  . Bipolar disorder (HCC)   . Eating disorder    Under control per patient  . Fibromyalgia   . GERD (gastroesophageal reflux disease)   . Kidney stone   . Painful intercourse   . Painful menstrual periods   . Pelvic pain in female   . PTSD (post-traumatic stress disorder)   . Renal disorder   . Spinal stenosis   . Uterine polyp   . VWD (acquired von Willebrand's disease) [facility](HCC)     Past Surgical History:  Procedure Laterality Date  . ABDOMINAL HYSTERECTOMY    . HYSTEROSCOPY     removed polyps  . LAPAROSCOPIC VAGINAL [facility]  [facility]   [facility]  . LAPAROSCOPY Left 01/10/2015   Procedure: LAPAROSCOPY OPERATIVE with biopsy, left oopherectomy;  Surgeon: Herold HarmsMartin A Defrancesco, MD;  Location: [facility] ORS;  Service: Gynecology;  Laterality: Left;  . LAPAROSCOPY ABDOMEN DIAGNOSTIC    . TUBAL LIGATION      Family History  Problem Relation  Age of Onset  . Hypertension Father   . Sleep apnea Mother   . Breast cancer Paternal Grandmother   . Diabetes Maternal Grandmother   . Diabetes Maternal Aunt   . Diabetes Maternal Uncle     Social History   Social History  . Marital status: Widowed    Spouse name: N/A  . Number of children: 1  . Years of education: N/A   Occupational History  . Un-employed due to bipolar    Social History Main Topics  . Smoking status: Current Every Day Smoker    Packs/day: 0.50    Types: Cigarettes    Start date: 05/18/1995  . Smokeless tobacco: Never Used  . Alcohol use No     Comment: Socially  . Drug use: No  . Sexual activity: No   Other Topics Concern  . Not on file   Social History Narrative   Regular Exercise -  NO   Daily Caffeine Use:  1 cup coffee in am      1 child, one step child           Current Outpatient Prescriptions:  .  ALPRAZolam (XANAX) 0.5 MG tablet, Take 1 tablet (0.5 mg total) by mouth 3 (three) times daily as needed for anxiety., Disp: 90 tablet, Rfl: 0 .  ALPRAZolam (XANAX) 1 MG tablet, Take 1 tablet by mouth 2 (two) times daily., Disp: , Rfl:  .  Biotin w/ Vitamins C & E (HAIR SKIN & NAILS GUMMIES) 1250-7.5-7.5 MCG-MG-UNT CHEW, Chew 1 each by mouth daily., Disp: , Rfl:  .  DULoxetine (CYMBALTA) 30 MG capsule, Take 30 mg by mouth daily., Disp: , Rfl:  .  loperamide (IMODIUM A-D) 2 MG tablet, Take 1 tablet (2 mg total) by mouth 4 (four) times daily as needed for diarrhea or loose stools., Disp: 12 tablet, Rfl: 0 .  LYRICA 150 MG capsule, Take 1 capsule (150 mg total) by mouth 3 (three) times daily., Disp: 90 capsule, Rfl: 0 .  mupirocin ointment (BACTROBAN) 2 %, Apply to affected area 3 times daily, Disp: 22 g, Rfl: 0 .  ondansetron (ZOFRAN ODT) 4 MG disintegrating tablet, Take 1 tablet (4 mg total) by mouth every 8 (eight) hours as needed for nausea or vomiting., Disp: 20 tablet, Rfl: 0 .  oxyCODONE-acetaminophen (PERCOCET) 7.5-325 MG tablet, Take 1  tablet by mouth every 8 (eight) hours as needed., Disp: 90 tablet, Rfl: 0 .  QUEtiapine (SEROQUEL) 300 MG tablet, Take 300 mg by mouth at bedtime. , Disp: , Rfl:  .  zolpidem (AMBIEN) 10 MG tablet, Take 1 tablet by mouth at bedtime., Disp: , Rfl:   Allergies  Allergen Reactions  . Wellbutrin [Bupropion Hcl] Other (See Comments)    Reaction:  Suicidal   . Augmentin [Amoxicillin-Pot Clavulanate] Diarrhea, Nausea And Vomiting and Other (See Comments)    Has patient had a PCN reaction causing immediate rash, facial/tongue/throat swelling, SOB or lightheadedness with hypotension: No Has patient had a PCN reaction causing severe rash involving mucus membranes or skin necrosis: No Has patient had a PCN reaction that required hospitalization No Has patient had a PCN reaction occurring within the last 10 years: Yes If all of the above answers are "NO", then may proceed with Cephalosporin use.  Marland Kitchen. Penicillins Diarrhea, Nausea And Vomiting and Other (See Comments)    Has patient had a PCN reaction causing immediate rash, facial/tongue/throat swelling, SOB or lightheadedness with hypotension: No Has patient had a PCN reaction causing severe rash involving mucus membranes or skin necrosis: No Has patient had a PCN reaction that required hospitalization No Has patient had a PCN reaction occurring within the last 10 years: Yes If all of the above answers are "NO", then may proceed with Cephalosporin use.  [name]. Vicoprofen  [Hydrocodone-Ibuprofen] Nausea And Vomiting  . Lasix [Furosemide] Rash  . Nitrofurantoin Monohyd Macro Rash     Review of Systems  Constitutional: Positive for irritability.  Cardiovascular: Positive for palpitations.  Psychiatric/Behavioral: The patient is nervous/anxious and has insomnia.     Objective  Vitals:   05/24/16 1028  BP: 110/64  Pulse: 100  Resp: 16  Temp: 98.1 F (36.7 C)  TempSrc: Oral  SpO2: 96%  Weight: 158 lb 3.2 oz (71.8 kg)  Height: 5\' 2"  (1.575 m)     Physical Exam  Constitutional: She is oriented to person, place, and time and well-developed, well-nourished, and in no distress.  Cardiovascular: Normal rate, regular rhythm and normal heart sounds.   No murmur heard. Pulmonary/Chest: Effort normal and breath sounds normal. She has no wheezes.  Musculoskeletal:       Cervical back: She exhibits tenderness, pain and spasm.       Lumbar back: She exhibits tenderness, pain and spasm.       Back:  Neurological: She is alert and oriented to person, place, and time.  Psychiatric: Mood, memory, affect and judgment normal.  Nursing note and vitals  reviewed.    Assessment & Plan  1. Fibromyalgia Stable, responsive to Percocet and Lyrica as prescribed. Patient compliant with controlled substances agreement. Refills provided - oxyCODONE-acetaminophen (PERCOCET) 7.5-325 MG tablet; Take 1 tablet by mouth every 8 (eight) hours as needed.  Dispense: 90 tablet; Refill: 0 - LYRICA 150 MG capsule; Take 1 capsule (150 mg total) by mouth 3 (three) times daily.  Dispense: 90 capsule; Refill: 0  2. Generalized anxiety disorder Stable and responsive to alprazolam taken 3 times daily when needed.  - ALPRAZolam (XANAX) 0.5 MG tablet; Take 1 tablet (0.5 mg total) by mouth 3 (three) times daily as needed for anxiety.  Dispense: 90 tablet; Refill: 0  3. Bipolar disorder with depression (HCC) Followed by psychiatry,  Hazel Sams A. Faylene Kurtz [facility] 05/24/2016 10:41 AM

## 2016-06-01 DIAGNOSIS — F3181 Bipolar II disorder: Secondary | ICD-10-CM | POA: Diagnosis not present

## 2016-06-01 DIAGNOSIS — F633 Trichotillomania: Secondary | ICD-10-CM | POA: Diagnosis not present

## 2016-06-01 DIAGNOSIS — F4310 Post-traumatic stress disorder, unspecified: Secondary | ICD-10-CM | POA: Diagnosis not present

## 2016-06-19 ENCOUNTER — Ambulatory Visit: Payer: Self-pay | Admitting: Family Medicine

## 2016-06-21 ENCOUNTER — Ambulatory Visit (INDEPENDENT_AMBULATORY_CARE_PROVIDER_SITE_OTHER): Payer: Medicare Other | Admitting: Family Medicine

## 2016-06-21 ENCOUNTER — Encounter: Payer: Self-pay | Admitting: Family Medicine

## 2016-06-21 VITALS — BP 110/64 | HR 100 | Temp 98.0°F | Resp 16 | Ht 62.0 in | Wt 167.2 lb

## 2016-06-21 DIAGNOSIS — F411 Generalized anxiety disorder: Secondary | ICD-10-CM | POA: Diagnosis not present

## 2016-06-21 DIAGNOSIS — R112 Nausea with vomiting, unspecified: Secondary | ICD-10-CM

## 2016-06-21 DIAGNOSIS — M797 Fibromyalgia: Secondary | ICD-10-CM | POA: Diagnosis not present

## 2016-06-21 DIAGNOSIS — T50905A Adverse effect of unspecified drugs, medicaments and biological substances, initial encounter: Secondary | ICD-10-CM | POA: Diagnosis not present

## 2016-06-21 DIAGNOSIS — F3132 Bipolar disorder, current episode depressed, moderate: Secondary | ICD-10-CM | POA: Diagnosis not present

## 2016-06-21 DIAGNOSIS — F4310 Post-traumatic stress disorder, unspecified: Secondary | ICD-10-CM | POA: Diagnosis not present

## 2016-06-21 DIAGNOSIS — F319 Bipolar disorder, unspecified: Secondary | ICD-10-CM | POA: Diagnosis not present

## 2016-06-21 MED ORDER — OXYCODONE-ACETAMINOPHEN 7.5-325 MG PO TABS: 1.0000 | ORAL_TABLET | Freq: Three times a day (TID) | ORAL | 0 refills | Status: DC | PRN

## 2016-06-21 MED ORDER — PROMETHAZINE HCL 25 MG PO TABS: 25.0000 mg | ORAL_TABLET | Freq: Three times a day (TID) | ORAL | 0 refills | Status: DC | PRN

## 2016-06-21 MED ORDER — ALPRAZOLAM 0.5 MG PO TABS: 0.5000 mg | ORAL_TABLET | Freq: Three times a day (TID) | ORAL | 0 refills | Status: DC | PRN

## 2016-06-21 MED ORDER — LYRICA 150 MG PO CAPS: 150.0000 mg | ORAL_CAPSULE | Freq: Three times a day (TID) | ORAL | 0 refills | Status: DC

## 2016-06-21 NOTE — Progress Notes (Signed)
Name: Casey Hodges   MRN: [id number]    DOB: 02/28/1976   Date:06/21/2016       Progress Note  Subjective  Chief Complaint  Chief Complaint  Patient presents with  . Follow-up    medication refills    Anxiety  Presents for follow-up visit. Symptoms include depressed mood, excessive worry (worse at the time of holidays), insomnia, irritability, nervous/anxious behavior, palpitations and restlessness. Patient reports no panic. Symptoms occur most days. The severity of symptoms is moderate and causing significant distress. The quality of sleep is fair.     Fibromyalgia: Symptoms include chronic generalized pain specifically in knees, hips, neck, and lower back. Pain in worse in neck, lower back and both hips. Today, pain is rated at 7/10, she hasn't taken the first dose today.   She takes Oxycodone-Acetaminophen 7.5-325 mg every 8 hours as needed, and Lyrica 150 mg three times daily which helps relieve her pain. No side effects reported.  Pt. Is also requesting refill for Phenergan for intermittent nausea and vomiting induced by opioids,  she was prescribed Zofran which is not covered by her insurance.   Past Medical History:  Diagnosis Date  . Abnormal vaginal Pap smear    10+ years ago- no colpo repeat was normal  . Anxiety   . AR (allergic rhinitis)   . Bipolar affective (HCC)    pt reported  . Bipolar disorder (HCC)   . Eating disorder    Under control per patient  . Fibromyalgia   . GERD (gastroesophageal reflux disease)   . Kidney stone   . Painful intercourse   . Painful menstrual periods   . Pelvic pain in female   . PTSD (post-traumatic stress disorder)   . Renal disorder   . Spinal stenosis   . Uterine polyp   . VWD (acquired von Willebrand's disease) [facility](HCC)     Past Surgical History:  Procedure Laterality Date  . ABDOMINAL HYSTERECTOMY    . HYSTEROSCOPY     removed polyps  . [facility]  . LAPAROSCOPY Left  01/10/2015   Procedure: LAPAROSCOPY OPERATIVE with biopsy, left oopherectomy;  Surgeon: Herold HarmsMartin A Defrancesco, MD;  Location: [facility] ORS;  Service: Gynecology;  Laterality: Left;  . LAPAROSCOPY ABDOMEN DIAGNOSTIC    . TUBAL LIGATION      Family History  Problem Relation Age of Onset  . Hypertension Father   . Sleep apnea Mother   . Breast cancer Paternal Grandmother   . Diabetes Maternal Grandmother   . Diabetes Maternal Aunt   . Diabetes Maternal Uncle     Social History   Social History  . Marital status: Widowed    Spouse name: N/A  . Number of children: 1  . Years of education: N/A   Occupational History  . Un-employed due to bipolar    Social History Main Topics  . Smoking status: Current Every Day Smoker    Packs/day: 0.50    Types: Cigarettes    Start date: 05/18/1995  . Smokeless tobacco: Never Used  . Alcohol use No     Comment: Socially  . Drug use: No  . Sexual activity: No   Other Topics Concern  . Not on file   Social History Narrative   Regular Exercise -  NO   Daily Caffeine Use:  1 cup coffee in am      1 child, one step child  Current Outpatient Prescriptions:  .  ALPRAZolam (XANAX) 0.5 MG tablet, Take 1 tablet (0.5 mg total) by mouth 3 (three) times daily as needed for anxiety., Disp: 90 tablet, Rfl: 0 .  ALPRAZolam (XANAX) 1 MG tablet, Take 1 tablet by mouth 2 (two) times daily., Disp: , Rfl:  .  Biotin w/ Vitamins C & E (HAIR SKIN & NAILS GUMMIES) 1250-7.5-7.5 MCG-MG-UNT CHEW, Chew 1 each by mouth daily., Disp: , Rfl:  .  DULoxetine (CYMBALTA) 30 MG capsule, Take 30 mg by mouth daily., Disp: , Rfl:  .  LYRICA 150 MG capsule, Take 1 capsule (150 mg total) by mouth 3 (three) times daily., Disp: 90 capsule, Rfl: 0 .  mupirocin ointment (BACTROBAN) 2 %, Apply to affected area 3 times daily, Disp: 22 g, Rfl: 0 .  oxyCODONE-acetaminophen (PERCOCET) 7.5-325 MG tablet, Take 1 tablet by mouth every 8 (eight) hours as needed., Disp: 90 tablet,  Rfl: 0 .  QUEtiapine (SEROQUEL) 300 MG tablet, Take 300 mg by mouth at bedtime. , Disp: , Rfl:  .  zolpidem (AMBIEN) 10 MG tablet, Take 1 tablet by mouth at bedtime., Disp: , Rfl:  .  loperamide (IMODIUM A-D) 2 MG tablet, Take 1 tablet (2 mg total) by mouth 4 (four) times daily as needed for diarrhea or loose stools. (Patient not taking: Reported on 06/21/2016), Disp: 12 tablet, Rfl: 0  Allergies  Allergen Reactions  . Wellbutrin [Bupropion Hcl] Other (See Comments)    Reaction:  Suicidal   . Augmentin [Amoxicillin-Pot Clavulanate] Diarrhea, Nausea And Vomiting and Other (See Comments)    Has patient had a PCN reaction causing immediate rash, facial/tongue/throat swelling, SOB or lightheadedness with hypotension: No Has patient had a PCN reaction causing severe rash involving mucus membranes or skin necrosis: No Has patient had a PCN reaction that required hospitalization No Has patient had a PCN reaction occurring within the last 10 years: Yes If all of the above answers are "NO", then may proceed with Cephalosporin use.  Marland Kitchen Penicillins Diarrhea, Nausea And Vomiting and Other (See Comments)    Has patient had a PCN reaction causing immediate rash, facial/tongue/throat swelling, SOB or lightheadedness with hypotension: No Has patient had a PCN reaction causing severe rash involving mucus membranes or skin necrosis: No Has patient had a PCN reaction that required hospitalization No Has patient had a PCN reaction occurring within the last 10 years: Yes If all of the above answers are "NO", then may proceed with Cephalosporin use.  [name]  [Hydrocodone-Ibuprofen] Nausea And Vomiting  . Lasix [Furosemide] Rash  . Nitrofurantoin Monohyd Macro Rash     Review of Systems  Constitutional: Positive for irritability.  Cardiovascular: Positive for palpitations.  Psychiatric/Behavioral: The patient is nervous/anxious and has insomnia.       Objective  Vitals:   06/21/16 1045  BP:  110/64  Pulse: 100  Resp: 16  Temp: 98 F (36.7 C)  TempSrc: Oral  SpO2: 96%  Weight: 167 lb 3.2 oz (75.8 kg)  Height: 5\' 2"  (1.575 m)    Physical Exam  Constitutional: She is oriented to person, place, and time and well-developed, well-nourished, and in no distress.  HENT:  Head: Normocephalic and atraumatic.  Cardiovascular: Normal rate, regular rhythm, S1 normal, S2 normal and normal heart sounds.   No murmur heard. Pulmonary/Chest: Effort normal and breath sounds normal. She has no wheezes. She has no rhonchi.  Musculoskeletal:       Right hip: She exhibits tenderness. She exhibits no swelling.  Left hip: She exhibits tenderness. She exhibits no swelling.       Cervical back: She exhibits tenderness.       Lumbar back: She exhibits tenderness. She exhibits no swelling.       Back:       Legs: Neurological: She is alert and oriented to person, place, and time.  Psychiatric: Mood, affect and judgment normal.  Nursing note and vitals reviewed.      Assessment & Plan  1. Fibromyalgia Stable, pain is responsive to opioid therapy and Lyrica as prescribed. Patient compliant with controlled substances agreement. Refills provided and follow-up in one month - oxyCODONE-acetaminophen (PERCOCET) 7.5-325 MG tablet; Take 1 tablet by mouth every 8 (eight) hours as needed.  Dispense: 90 tablet; Refill: 0 - LYRICA 150 MG capsule; Take 1 capsule (150 mg total) by mouth 3 (three) times daily.  Dispense: 90 capsule; Refill: 0  2. Generalized anxiety disorder Slightly worse because of the holidays, she is also seeing a psychiatrist. Continue on alprazolam as prescribed. Patient compliant with controlled substances agreement - ALPRAZolam (XANAX) 0.5 MG tablet; Take 1 tablet (0.5 mg total) by mouth 3 (three) times daily as needed for anxiety.  Dispense: 90 tablet; Refill: 0  3. Drug-induced nausea and vomiting  - promethazine (PHENERGAN) 25 MG tablet; Take 1 tablet (25 mg total) by  mouth every 8 (eight) hours as needed for nausea or vomiting.  Dispense: 30 tablet; Refill: 0   Faithe Ariola Asad A. Faylene KurtzShah [facility] 06/21/2016 10:59 AM

## 2016-07-06 DIAGNOSIS — F4310 Post-traumatic stress disorder, unspecified: Secondary | ICD-10-CM | POA: Diagnosis not present

## 2016-07-06 DIAGNOSIS — F3181 Bipolar II disorder: Secondary | ICD-10-CM | POA: Diagnosis not present

## 2016-07-06 DIAGNOSIS — F633 Trichotillomania: Secondary | ICD-10-CM | POA: Diagnosis not present

## 2016-07-19 DIAGNOSIS — Z79899 Other long term (current) drug therapy: Secondary | ICD-10-CM | POA: Diagnosis not present

## 2016-07-19 DIAGNOSIS — F4310 Post-traumatic stress disorder, unspecified: Secondary | ICD-10-CM | POA: Diagnosis not present

## 2016-07-19 DIAGNOSIS — F3132 Bipolar disorder, current episode depressed, moderate: Secondary | ICD-10-CM | POA: Diagnosis not present

## 2016-07-19 DIAGNOSIS — F319 Bipolar disorder, unspecified: Secondary | ICD-10-CM | POA: Diagnosis not present

## 2016-07-23 ENCOUNTER — Ambulatory Visit (INDEPENDENT_AMBULATORY_CARE_PROVIDER_SITE_OTHER): Payer: Medicare Other | Admitting: Family Medicine

## 2016-07-23 ENCOUNTER — Encounter: Payer: Self-pay | Admitting: Family Medicine

## 2016-07-23 VITALS — BP 112/67 | HR 103 | Temp 98.2°F | Resp 17 | Ht 62.0 in | Wt 154.7 lb

## 2016-07-23 DIAGNOSIS — T50905A Adverse effect of unspecified drugs, medicaments and biological substances, initial encounter: Secondary | ICD-10-CM

## 2016-07-23 DIAGNOSIS — F411 Generalized anxiety disorder: Secondary | ICD-10-CM

## 2016-07-23 DIAGNOSIS — F332 Major depressive disorder, recurrent severe without psychotic features: Secondary | ICD-10-CM | POA: Diagnosis not present

## 2016-07-23 DIAGNOSIS — M797 Fibromyalgia: Secondary | ICD-10-CM

## 2016-07-23 DIAGNOSIS — R112 Nausea with vomiting, unspecified: Secondary | ICD-10-CM | POA: Diagnosis not present

## 2016-07-23 MED ORDER — LYRICA 150 MG PO CAPS: 150.0000 mg | ORAL_CAPSULE | Freq: Three times a day (TID) | ORAL | 0 refills | Status: DC

## 2016-07-23 MED ORDER — OXYCODONE-ACETAMINOPHEN 7.5-325 MG PO TABS: 1.0000 | ORAL_TABLET | Freq: Three times a day (TID) | ORAL | 0 refills | Status: DC | PRN

## 2016-07-23 MED ORDER — ALPRAZOLAM 0.5 MG PO TABS: 0.5000 mg | ORAL_TABLET | Freq: Three times a day (TID) | ORAL | 0 refills | Status: DC | PRN

## 2016-07-23 MED ORDER — PROMETHAZINE HCL 25 MG PO TABS: 25.0000 mg | ORAL_TABLET | Freq: Three times a day (TID) | ORAL | 0 refills | Status: DC | PRN

## 2016-07-23 NOTE — Progress Notes (Signed)
Name: Casey Hodges   MRN: [id number]    DOB: [birth year]   Date:07/23/2016       Progress Note  Subjective  Chief Complaint  Chief Complaint  Patient presents with  . Follow-up    1 mo  . Medication Refill    Anxiety  Presents for follow-up visit. Symptoms include depressed mood, excessive worry, insomnia, irritability, nervous/anxious behavior, palpitations and restlessness. Patient reports no panic. Symptoms occur constantly. The severity of symptoms is moderate and causing significant distress. The quality of sleep is fair.    Fibromyalgia: Pt. has long-standing history of Fibromyalgia, described as pain in the lower back, neck and shoulders, hips, and feet. She has concurrent depression and anxiety. She frequently feels tired and cannot seem to sleep well. She is taking Percocet 7.5 mg three times daily when needed, Lyrica 150 mg three times daily as needed and is on anti-depressant therapy by her Psychiatrist.   Depression: Long-standing history of treatment resistant depression, currently being seen by a local Psychiatrist who has suggested ECT to the patient in view of her persistent symptoms of depression. Patient is really scared about ECT and does not want to go that route. She is taking [place] Cymbalta as prescribed by Psychiatrist.    Past Medical History:  Diagnosis Date  . Abnormal vaginal Pap smear    10+ years ago- no colpo repeat was normal  . Anxiety   . AR (allergic rhinitis)   . Bipolar affective (HCC)    pt reported  . Bipolar disorder (HCC)   . Eating disorder    Under control per patient  . Fibromyalgia   . GERD (gastroesophageal reflux disease)   . Kidney stone   . Painful intercourse   . Painful menstrual periods   . Pelvic pain in female   . PTSD (post-traumatic stress disorder)   . Renal disorder   . Spinal stenosis   . Uterine polyp   . VWD (acquired von Willebrand's disease) [facility])     Past Surgical History:  Procedure Laterality Date    . ABDOMINAL HYSTERECTOMY    . HYSTEROSCOPY     removed polyps  . LAPAROSCOPIC VAGINAL HYSTERECTOMY  2015   at [facility]  . LAPAROSCOPY Left 01/10/2015   Procedure: LAPAROSCOPY OPERATIVE with biopsy, left oopherectomy;  Surgeon: Herold Harms, MD;  Location: [facility] ORS;  Service: Gynecology;  Laterality: Left;  . LAPAROSCOPY ABDOMEN DIAGNOSTIC    . TUBAL LIGATION      Family History  Problem Relation Age of Onset  . Hypertension Father   . Sleep apnea Mother   . Breast cancer Paternal Grandmother   . Diabetes Maternal Grandmother   . Diabetes Maternal Aunt   . Diabetes Maternal Uncle     Social History   Social History  . Marital status: Widowed    Spouse name: N/A  . Number of children: 1  . Years of education: N/A   Occupational History  . Un-employed due to bipolar    Social History Main Topics  . Smoking status: Current Every Day Smoker    Packs/day: 0.50    Types: Cigarettes    Start date: 05/18/1995  . Smokeless tobacco: Never Used  . Alcohol use No     Comment: Socially  . Drug use: No  . Sexual activity: No   Other Topics Concern  . Not on file   Social History Narrative   Regular Exercise -  NO   Daily Caffeine Use:  1 cup  coffee in am      1 child, one step child           Current Outpatient Prescriptions:  .  ALPRAZolam (XANAX) 0.5 MG tablet, Take 1 tablet (0.5 mg total) by mouth 3 (three) times daily as needed for anxiety., Disp: 90 tablet, Rfl: 0 .  ALPRAZolam (XANAX) 1 MG tablet, Take 1 tablet by mouth 2 (two) times daily., Disp: , Rfl:  .  Biotin w/ Vitamins C & E (HAIR SKIN & NAILS GUMMIES) 1250-7.5-7.5 MCG-MG-UNT CHEW, Chew 1 each by mouth daily., Disp: , Rfl:  .  DULoxetine (CYMBALTA) 30 MG capsule, Take 30 mg by mouth daily., Disp: , Rfl:  .  loperamide (IMODIUM A-D) 2 MG tablet, Take 1 tablet (2 mg total) by mouth 4 (four) times daily as needed for diarrhea or loose stools., Disp: 12 tablet, Rfl: 0 .  LYRICA 150 MG capsule, Take  1 capsule (150 mg total) by mouth 3 (three) times daily., Disp: 90 capsule, Rfl: 0 .  mupirocin ointment (BACTROBAN) 2 %, Apply to affected area 3 times daily, Disp: 22 g, Rfl: 0 .  oxyCODONE-acetaminophen (PERCOCET) 7.5-325 MG tablet, Take 1 tablet by mouth every 8 (eight) hours as needed., Disp: 90 tablet, Rfl: 0 .  promethazine (PHENERGAN) 25 MG tablet, Take 1 tablet (25 mg total) by mouth every 8 (eight) hours as needed for nausea or vomiting., Disp: 30 tablet, Rfl: 0 .  QUEtiapine (SEROQUEL) 300 MG tablet, Take 300 mg by mouth at bedtime. , Disp: , Rfl:  .  zolpidem (AMBIEN) 10 MG tablet, Take 1 tablet by mouth at bedtime., Disp: , Rfl:   Allergies  Allergen Reactions  . Wellbutrin [Bupropion Hcl] Other (See Comments)    Reaction:  Suicidal   . Augmentin [Amoxicillin-Pot Clavulanate] Diarrhea, Nausea And Vomiting and Other (See Comments)    Has patient had a PCN reaction causing immediate rash, facial/tongue/throat swelling, SOB or lightheadedness with hypotension: No Has patient had a PCN reaction causing severe rash involving mucus membranes or skin necrosis: No Has patient had a PCN reaction that required hospitalization No Has patient had a PCN reaction occurring within the last 10 years: Yes If all of the above answers are "NO", then may proceed with Cephalosporin use.  Marland Kitchen. Penicillins Diarrhea, Nausea And Vomiting and Other (See Comments)    Has patient had a PCN reaction causing immediate rash, facial/tongue/throat swelling, SOB or lightheadedness with hypotension: No Has patient had a PCN reaction causing severe rash involving mucus membranes or skin necrosis: No Has patient had a PCN reaction that required hospitalization No Has patient had a PCN reaction occurring within the last 10 years: Yes If all of the above answers are "NO", then may proceed with Cephalosporin use.  [name]. Vicoprofen  [Hydrocodone-Ibuprofen] Nausea And Vomiting  . Lasix [Furosemide] Rash  . Nitrofurantoin  Monohyd Macro Rash     Review of Systems  Constitutional: Positive for irritability.  Cardiovascular: Positive for palpitations.  Psychiatric/Behavioral: The patient is nervous/anxious and has insomnia.       Objective  Vitals:   07/23/16 0955  BP: 112/67  Pulse: (!) 103  Resp: 17  Temp: 98.2 F (36.8 C)  TempSrc: Oral  SpO2: 97%  Weight: 154 lb 11.2 oz (70.2 kg)  Height: 5\' 2"  (1.575 m)    Physical Exam  Constitutional: She is oriented to person, place, and time and well-developed, well-nourished, and in no distress.  HENT:  Head: Normocephalic and atraumatic.  Cardiovascular: Normal  rate, regular rhythm, S1 normal, S2 normal and normal heart sounds.   No murmur heard. Pulmonary/Chest: Effort normal and breath sounds normal. She has no wheezes. She has no rhonchi.  Musculoskeletal:       Right hip: She exhibits tenderness. She exhibits no swelling.       Left hip: She exhibits tenderness. She exhibits no swelling.       Right knee: Tenderness found.       Left knee: Tenderness found.       Cervical back: She exhibits tenderness.       Lumbar back: She exhibits tenderness. She exhibits no swelling.       Back:       Legs: Neurological: She is alert and oriented to person, place, and time.  Psychiatric: Memory and judgment normal. She exhibits a depressed mood. She has a flat affect.  Nursing note and vitals reviewed.    Assessment & Plan  1. Severe episode of recurrent major depressive disorder, without psychotic features (HCC) Referred to psychiatry at [facility] for second opinion, patient does not want to proceed with ECT as advised by her local psychiatrist - Ambulatory referral to Psychiatry  2. Fibromyalgia Symptoms of fiber myalgia responsive to Percocet and Lyrica can as prescribed, compliant with controlled substances agreement. Refills provided - oxyCODONE-acetaminophen (PERCOCET) 7.5-325 MG tablet; Take 1 tablet by mouth every 8 (eight) hours as needed.   Dispense: 90 tablet; Refill: 0 - LYRICA 150 MG capsule; Take 1 capsule (150 mg total) by mouth 3 (three) times daily.  Dispense: 90 capsule; Refill: 0  3. Generalized anxiety disorder Symptoms of anxiety are generally worse with worsening depression, continue on alprazolam up to 3 times daily when needed - ALPRAZolam (XANAX) 0.5 MG tablet; Take 1 tablet (0.5 mg total) by mouth 3 (three) times daily as needed for anxiety.  Dispense: 90 tablet; Refill: 0  4. Drug-induced nausea and vomiting  - promethazine (PHENERGAN) 25 MG tablet; Take 1 tablet (25 mg total) by mouth every 8 (eight) hours as needed for nausea or vomiting.  Dispense: 30 tablet; Refill: 0   Mirriam Vadala Asad A. [facility] 07/23/2016 10:19 AM

## 2016-08-05 DIAGNOSIS — J028 Acute pharyngitis due to other specified organisms: Secondary | ICD-10-CM | POA: Diagnosis not present

## 2016-08-05 DIAGNOSIS — B9789 Other viral agents as the cause of diseases classified elsewhere: Secondary | ICD-10-CM | POA: Diagnosis not present

## 2016-08-05 DIAGNOSIS — K529 Noninfective gastroenteritis and colitis, unspecified: Secondary | ICD-10-CM | POA: Diagnosis not present

## 2016-08-08 ENCOUNTER — Encounter: Payer: Self-pay | Admitting: Family Medicine

## 2016-08-08 ENCOUNTER — Ambulatory Visit
Admission: RE | Admit: 2016-08-08 | Discharge: 2016-08-08 | Disposition: A | Discharge status: Home / Self Care | Payer: Medicare Other | Source: Ambulatory Visit | Attending: Family Medicine | Admitting: Family Medicine

## 2016-08-08 ENCOUNTER — Ambulatory Visit (INDEPENDENT_AMBULATORY_CARE_PROVIDER_SITE_OTHER): Payer: Medicare Other | Admitting: Family Medicine

## 2016-08-08 DIAGNOSIS — R05 Cough: Secondary | ICD-10-CM

## 2016-08-08 DIAGNOSIS — R058 Other specified cough: Secondary | ICD-10-CM | POA: Insufficient documentation

## 2016-08-08 MED ORDER — HYDROCODONE-HOMATROPINE 5-1.5 MG/5ML PO SYRP
5.0000 mL | ORAL_SOLUTION | Freq: Four times a day (QID) | ORAL | 0 refills | Status: DC | PRN
Start: 2016-08-08 — End: 2016-08-22

## 2016-08-08 NOTE — Progress Notes (Signed)
Name: Casey Hodges   MRN: [id number]    DOB: Mar 03, 1976   Date:08/08/2016       Progress Note  Subjective  Chief Complaint  Chief Complaint  Patient presents with  . URI    cough, burning in chest, congested, sore throat, low grade fever for 4 days    Sore Throat   This is a new problem. The current episode started in the past 7 days (5 days ago). There has been no fever. Associated symptoms include coughing and headaches. She has had no exposure to strep.  Seen at urgent care and was treated for viral pharyngitis with Hycodan and prednisone, reports that her symptoms are improving but she still has persistent coughing  Past Medical History:  Diagnosis Date  . Abnormal vaginal Pap smear    10+ years ago- no colpo repeat was normal  . Anxiety   . AR (allergic rhinitis)   . Bipolar affective (HCC)    pt reported  . Bipolar disorder (HCC)   . Eating disorder    Under control per patient  . Fibromyalgia   . GERD (gastroesophageal reflux disease)   . Kidney stone   . Painful intercourse   . Painful menstrual periods   . Pelvic pain in female   . PTSD (post-traumatic stress disorder)   . Renal disorder   . Spinal stenosis   . Uterine polyp   . VWD (acquired von Willebrand's disease) [facility])     Past Surgical History:  Procedure Laterality Date  . ABDOMINAL HYSTERECTOMY    . HYSTEROSCOPY     removed polyps  . LAPAROSCOPIC VAGINAL HYSTERECTOMY  2015   at [facility]  . LAPAROSCOPY Left 01/10/2015   Procedure: LAPAROSCOPY OPERATIVE with biopsy, left oopherectomy;  Surgeon: Herold Harms, MD;  Location: [facility] ORS;  Service: Gynecology;  Laterality: Left;  . LAPAROSCOPY ABDOMEN DIAGNOSTIC    . TUBAL LIGATION      Family History  Problem Relation Age of Onset  . Hypertension Father   . Sleep apnea Mother   . Breast cancer Paternal Grandmother   . Diabetes Maternal Grandmother   . Diabetes Maternal Aunt   . Diabetes Maternal Uncle     Social History   Social  History  . Marital status: Widowed    Spouse name: N/A  . Number of children: 1  . Years of education: N/A   Occupational History  . Un-employed due to bipolar    Social History Main Topics  . Smoking status: Current Every Day Smoker    Packs/day: 0.50    Types: Cigarettes    Start date: 05/18/1995  . Smokeless tobacco: Never Used  . Alcohol use No     Comment: Socially  . Drug use: No  . Sexual activity: No   Other Topics Concern  . Not on file   Social History Narrative   Regular Exercise -  NO   Daily Caffeine Use:  1 cup coffee in am      1 child, one step child           Current Outpatient Prescriptions:  .  ALPRAZolam (XANAX) 0.5 MG tablet, Take 1 tablet (0.5 mg total) by mouth 3 (three) times daily as needed for anxiety., Disp: 90 tablet, Rfl: 0 .  ALPRAZolam (XANAX) 1 MG tablet, Take 1 tablet by mouth 2 (two) times daily., Disp: , Rfl:  .  Biotin w/ Vitamins C & E (HAIR SKIN & NAILS GUMMIES) 1250-7.5-7.5 MCG-MG-UNT CHEW, Chew  1 each by mouth daily., Disp: , Rfl:  .  DULoxetine (CYMBALTA) 30 MG capsule, Take 30 mg by mouth daily., Disp: , Rfl:  .  loperamide (IMODIUM A-D) 2 MG tablet, Take 1 tablet (2 mg total) by mouth 4 (four) times daily as needed for diarrhea or loose stools., Disp: 12 tablet, Rfl: 0 .  LYRICA 150 MG capsule, Take 1 capsule (150 mg total) by mouth 3 (three) times daily., Disp: 90 capsule, Rfl: 0 .  mupirocin ointment (BACTROBAN) 2 %, Apply to affected area 3 times daily, Disp: 22 g, Rfl: 0 .  oxyCODONE-acetaminophen (PERCOCET) 7.5-325 MG tablet, Take 1 tablet by mouth every 8 (eight) hours as needed., Disp: 90 tablet, Rfl: 0 .  promethazine (PHENERGAN) 25 MG tablet, Take 1 tablet (25 mg total) by mouth every 8 (eight) hours as needed for nausea or vomiting., Disp: 30 tablet, Rfl: 0 .  QUEtiapine (SEROQUEL) 300 MG tablet, Take 300 mg by mouth at bedtime. , Disp: , Rfl:  .  zolpidem (AMBIEN) 10 MG tablet, Take 1 tablet by mouth at bedtime., Disp:  , Rfl:   Allergies  Allergen Reactions  . Wellbutrin [Bupropion Hcl] Other (See Comments)    Reaction:  Suicidal   . Augmentin [Amoxicillin-Pot Clavulanate] Diarrhea, Nausea And Vomiting and Other (See Comments)    Has patient had a PCN reaction causing immediate rash, facial/tongue/throat swelling, SOB or lightheadedness with hypotension: No Has patient had a PCN reaction causing severe rash involving mucus membranes or skin necrosis: No Has patient had a PCN reaction that required hospitalization No Has patient had a PCN reaction occurring within the last 10 years: Yes If all of the above answers are "NO", then may proceed with Cephalosporin use.  Marland Kitchen Penicillins Diarrhea, Nausea And Vomiting and Other (See Comments)    Has patient had a PCN reaction causing immediate rash, facial/tongue/throat swelling, SOB or lightheadedness with hypotension: No Has patient had a PCN reaction causing severe rash involving mucus membranes or skin necrosis: No Has patient had a PCN reaction that required hospitalization No Has patient had a PCN reaction occurring within the last 10 years: Yes If all of the above answers are "NO", then may proceed with Cephalosporin use.  [name]  [Hydrocodone-Ibuprofen] Nausea And Vomiting  . Lasix [Furosemide] Rash  . Nitrofurantoin Monohyd Macro Rash     Review of Systems  Constitutional: Positive for chills. Negative for fever (she has had a low grade fever, temperature is normal today).  Respiratory: Positive for cough and sputum production.   Cardiovascular: Positive for chest pain.  Musculoskeletal: Positive for myalgias.  Neurological: Positive for headaches.    Objective  Vitals:   08/08/16 1035  BP: 110/70  Pulse: (!) 107  Resp: 16  Temp: 98.6 F (37 C)  TempSrc: Oral  SpO2: 97%  Weight: 156 lb 3.2 oz (70.9 kg)  Height: 5\' 2"  (1.575 m)    Physical Exam  Constitutional: She is oriented to person, place, and time and well-developed,  well-nourished, and in no distress.  HENT:  Head: Normocephalic and atraumatic.  Nose: Right sinus exhibits frontal sinus tenderness. Left sinus exhibits frontal sinus tenderness.  Mouth/Throat: Posterior oropharyngeal erythema present.  Cardiovascular: Normal rate, regular rhythm and normal heart sounds.   No murmur heard. Pulmonary/Chest: Effort normal and breath sounds normal. She has no wheezes.  Neurological: She is alert and oriented to person, place, and time.  Psychiatric: Mood, memory, affect and judgment normal.  Nursing note and vitals reviewed.  Assessment & Plan  1. Productive cough Suspect bronchitis, obtain chest x-ray to rule out pneumonia, refill for hydrocodone-homatropine provided, vice patient to not take oxycodone while she is on Hycodan, she verbalized agreement. Consider antibiotic therapy if symptoms do not improve within the next 2-3 days - HYDROcodone-homatropine (HYCODAN) 5-1.5 MG/5ML syrup; Take 5 mLs by mouth every 6 (six) hours as needed for cough.  Dispense: 120 mL; Refill: 0 - DG Chest 2 View; Future   Latonya Knight Asad A. Faylene KurtzShah [facility] 08/08/2016 10:57 AM

## 2016-08-10 DIAGNOSIS — F3181 Bipolar II disorder: Secondary | ICD-10-CM | POA: Diagnosis not present

## 2016-08-10 DIAGNOSIS — F633 Trichotillomania: Secondary | ICD-10-CM | POA: Diagnosis not present

## 2016-08-10 DIAGNOSIS — F4310 Post-traumatic stress disorder, unspecified: Secondary | ICD-10-CM | POA: Diagnosis not present

## 2016-08-13 DIAGNOSIS — R05 Cough: Secondary | ICD-10-CM | POA: Diagnosis not present

## 2016-08-13 DIAGNOSIS — N390 Urinary tract infection, site not specified: Secondary | ICD-10-CM | POA: Diagnosis not present

## 2016-08-16 DIAGNOSIS — F3132 Bipolar disorder, current episode depressed, moderate: Secondary | ICD-10-CM | POA: Diagnosis not present

## 2016-08-16 DIAGNOSIS — F319 Bipolar disorder, unspecified: Secondary | ICD-10-CM | POA: Diagnosis not present

## 2016-08-16 DIAGNOSIS — F4310 Post-traumatic stress disorder, unspecified: Secondary | ICD-10-CM | POA: Diagnosis not present

## 2016-08-22 ENCOUNTER — Encounter: Payer: Self-pay | Admitting: Family Medicine

## 2016-08-22 ENCOUNTER — Ambulatory Visit (INDEPENDENT_AMBULATORY_CARE_PROVIDER_SITE_OTHER): Payer: Medicare Other | Admitting: Family Medicine

## 2016-08-22 DIAGNOSIS — R000 Tachycardia, unspecified: Secondary | ICD-10-CM | POA: Diagnosis not present

## 2016-08-22 DIAGNOSIS — B379 Candidiasis, unspecified: Secondary | ICD-10-CM

## 2016-08-22 DIAGNOSIS — F411 Generalized anxiety disorder: Secondary | ICD-10-CM

## 2016-08-22 DIAGNOSIS — M797 Fibromyalgia: Secondary | ICD-10-CM | POA: Diagnosis not present

## 2016-08-22 DIAGNOSIS — T3695XA Adverse effect of unspecified systemic antibiotic, initial encounter: Secondary | ICD-10-CM | POA: Diagnosis not present

## 2016-08-22 HISTORY — DX: Candidiasis, unspecified: B37.9

## 2016-08-22 MED ORDER — ALPRAZOLAM 0.5 MG PO TABS: 0.5000 mg | ORAL_TABLET | Freq: Three times a day (TID) | ORAL | 0 refills | Status: DC | PRN

## 2016-08-22 MED ORDER — FLUCONAZOLE 150 MG PO TABS: 150.0000 mg | ORAL_TABLET | Freq: Once | ORAL | 0 refills | Status: AC

## 2016-08-22 MED ORDER — LYRICA 150 MG PO CAPS: 150.0000 mg | ORAL_CAPSULE | Freq: Three times a day (TID) | ORAL | 0 refills | Status: DC

## 2016-08-22 MED ORDER — OXYCODONE-ACETAMINOPHEN 7.5-325 MG PO TABS: 1.0000 | ORAL_TABLET | Freq: Three times a day (TID) | ORAL | 0 refills | Status: DC | PRN

## 2016-08-22 NOTE — Progress Notes (Signed)
Name: Casey Hodges   MRN: [id number]    DOB: 1975/08/09   Date:08/22/2016       Progress Note  Subjective  Chief Complaint  Chief Complaint  Patient presents with  . Medication Refill  . Depression  . Vaginitis    Was seen at [facility] for UTI now have yeast infection from antibiotic    Anxiety  Presents for follow-up visit. Symptoms include depressed mood, excessive worry, irritability, malaise and nervous/anxious behavior. Patient reports no panic. The severity of symptoms is moderate and causing significant distress.     Fibromyalgia: Pt. has long-standing history of Fibromyalgia, chronic generalized arthralgias including neck and shoulders, lower back and hips, and both knees. She has concurrent depression and is being seen by Psychiatry. She also describes generalized fatigue and malaise. Pain is rated at 8/10 today. She takes Lyrica and Percocet, which help relieve her symptoms. No side effects reported.   Patent has recently started an exercise regimen on the advise of ehr Psychiatric counselors, this includes walking on a treadmill. She has noticed that her heart rate goes up to 160 bpm, usually its never higher than 140 bpm, she also feels dizzy after she finishes off exercising and stops the treadmill. She had cardiac evaluation in January 2017, which was reported normal, although she did not get an echocardiogram.  She denies any chest pain, palpitations, sometimes has shortness of breath.   Patient is also having what appears tobe a yeast infection. She was started on Macrobid 100 mg twice daily for 10 days for a UTI, she describes " I'm on fire down there'. She has history of frequent yeast infections after taking antibiotics.    Past Medical History:  Diagnosis Date  . Abnormal vaginal Pap smear    10+ years ago- no colpo repeat was normal  . Anxiety   . AR (allergic rhinitis)   . Bipolar affective (HCC)    pt reported  . Bipolar disorder (HCC)   . Eating disorder    Under control per patient  . Fibromyalgia   . GERD (gastroesophageal reflux disease)   . Kidney stone   . Painful intercourse   . Painful menstrual periods   . Pelvic pain in female   . PTSD (post-traumatic stress disorder)   . Renal disorder   . Spinal stenosis   . Uterine polyp   . VWD (acquired von Willebrand's disease) [facility])     Past Surgical History:  Procedure Laterality Date  . ABDOMINAL HYSTERECTOMY    . HYSTEROSCOPY     removed polyps  . LAPAROSCOPIC VAGINAL HYSTERECTOMY  2015   at [facility]  . LAPAROSCOPY Left 01/10/2015   Procedure: LAPAROSCOPY OPERATIVE with biopsy, left oopherectomy;  Surgeon: Herold Harms, MD;  Location: [facility] ORS;  Service: Gynecology;  Laterality: Left;  . LAPAROSCOPY ABDOMEN DIAGNOSTIC    . TUBAL LIGATION      Family History  Problem Relation Age of Onset  . Hypertension Father   . Sleep apnea Mother   . Breast cancer Paternal Grandmother   . Diabetes Maternal Grandmother   . Diabetes Maternal Aunt   . Diabetes Maternal Uncle     Social History   Social History  . Marital status: Widowed    Spouse name: N/A  . Number of children: 1  . Years of education: N/A   Occupational History  . Un-employed due to bipolar    Social History Main Topics  . Smoking status: Current Every Day Smoker  Packs/day: 0.50    Types: Cigarettes    Start date: 05/18/1995  . Smokeless tobacco: Never Used  . Alcohol use No     Comment: Socially  . Drug use: No  . Sexual activity: No   Other Topics Concern  . Not on file   Social History Narrative   Regular Exercise -  NO   Daily Caffeine Use:  1 cup coffee in am      1 child, one step child           Current Outpatient Prescriptions:  .  ALPRAZolam (XANAX) 0.5 MG tablet, Take 1 tablet (0.5 mg total) by mouth 3 (three) times daily as needed for anxiety., Disp: 90 tablet, Rfl: 0 .  ALPRAZolam (XANAX) 1 MG tablet, Take 1 tablet by mouth 2 (two) times daily., Disp: , Rfl:  .   Biotin w/ Vitamins C & E (HAIR SKIN & NAILS GUMMIES) 1250-7.5-7.5 MCG-MG-UNT CHEW, Chew 1 each by mouth daily., Disp: , Rfl:  .  DULoxetine (CYMBALTA) 30 MG capsule, Take 30 mg by mouth daily., Disp: , Rfl:  .  loperamide (IMODIUM A-D) 2 MG tablet, Take 1 tablet (2 mg total) by mouth 4 (four) times daily as needed for diarrhea or loose stools., Disp: 12 tablet, Rfl: 0 .  LYRICA 150 MG capsule, Take 1 capsule (150 mg total) by mouth 3 (three) times daily., Disp: 90 capsule, Rfl: 0 .  mupirocin ointment (BACTROBAN) 2 %, Apply to affected area 3 times daily, Disp: 22 g, Rfl: 0 .  oxyCODONE-acetaminophen (PERCOCET) 7.5-325 MG tablet, Take 1 tablet by mouth every 8 (eight) hours as needed., Disp: 90 tablet, Rfl: 0 .  promethazine (PHENERGAN) 25 MG tablet, Take 1 tablet (25 mg total) by mouth every 8 (eight) hours as needed for nausea or vomiting., Disp: 30 tablet, Rfl: 0 .  QUEtiapine (SEROQUEL) 300 MG tablet, Take 300 mg by mouth at bedtime. , Disp: , Rfl:  .  zolpidem (AMBIEN) 10 MG tablet, Take 1 tablet by mouth at bedtime., Disp: , Rfl:   Allergies  Allergen Reactions  . Wellbutrin [Bupropion Hcl] Other (See Comments)    Reaction:  Suicidal   . Augmentin [Amoxicillin-Pot Clavulanate] Diarrhea, Nausea And Vomiting and Other (See Comments)    Has patient had a PCN reaction causing immediate rash, facial/tongue/throat swelling, SOB or lightheadedness with hypotension: No Has patient had a PCN reaction causing severe rash involving mucus membranes or skin necrosis: No Has patient had a PCN reaction that required hospitalization No Has patient had a PCN reaction occurring within the last 10 years: Yes If all of the above answers are "NO", then may proceed with Cephalosporin use.  Marland Kitchen Penicillins Diarrhea, Nausea And Vomiting and Other (See Comments)    Has patient had a PCN reaction causing immediate rash, facial/tongue/throat swelling, SOB or lightheadedness with hypotension: No Has patient had a PCN  reaction causing severe rash involving mucus membranes or skin necrosis: No Has patient had a PCN reaction that required hospitalization No Has patient had a PCN reaction occurring within the last 10 years: Yes If all of the above answers are "NO", then may proceed with Cephalosporin use.  [name]  [Hydrocodone-Ibuprofen] Nausea And Vomiting  . Lasix [Furosemide] Rash  . Nitrofurantoin Monohyd Macro Rash     Review of Systems  Constitutional: Positive for irritability.  Psychiatric/Behavioral: The patient is nervous/anxious.   Please see HPI for complete discussion of ROS.  Objective  Vitals:   08/22/16 1003  BP: 110/78  Pulse: (!) 110  Resp: 16  Temp: 97.9 F (36.6 C)  TempSrc: Oral  SpO2: 99%  Weight: 151 lb (68.5 kg)  Height: 5\' 2"  (1.575 m)    Physical Exam  Constitutional: She is well-developed, well-nourished, and in no distress.  HENT:  Head: Normocephalic and atraumatic.  Cardiovascular: Regular rhythm, S1 normal, S2 normal and normal heart sounds.  Tachycardia present.   No murmur heard. Pulmonary/Chest: Breath sounds normal. She has no wheezes. She has no rhonchi.  Abdominal: Soft. Bowel sounds are normal. There is no tenderness.  Musculoskeletal:       Left hip: She exhibits tenderness.       Lumbar back: She exhibits tenderness and pain.       Back:       Legs: Psychiatric: Memory and judgment normal. She exhibits a depressed mood. She has a flat affect.  Nursing note and vitals reviewed.    Assessment & Plan  1. Fibromyalgia Stable and responsive to pharmacotherapy, refills provided - LYRICA 150 MG capsule; Take 1 capsule (150 mg total) by mouth 3 (three) times daily.  Dispense: 90 capsule; Refill: 0 - oxyCODONE-acetaminophen (PERCOCET) 7.5-325 MG tablet; Take 1 tablet by mouth every 8 (eight) hours as needed.  Dispense: 90 tablet; Refill: 0  2. Generalized anxiety disorder Moderately worse because of recent stressors in her life. COntinue on  Alprazolam as prescribed.  - ALPRAZolam (XANAX) 0.5 MG tablet; Take 1 tablet (0.5 mg total) by mouth 3 (three) times daily as needed for anxiety.  Dispense: 90 tablet; Refill: 0  3. Antibiotic-induced yeast infection  - fluconazole (DIFLUCAN) 150 MG tablet; Take 1 tablet (150 mg total) by mouth once.  Dispense: 1 tablet; Refill: 0  4. Exercise-induced tachycardia Advised to follow up with cardiology, she will likely need a 2-d Echocardiogram.   Casey Hodges Asad A. Faylene KurtzShah [facility] 08/22/2016 10:14 AM

## 2016-08-24 ENCOUNTER — Encounter: Payer: Self-pay | Admitting: Cardiovascular Disease

## 2016-08-24 ENCOUNTER — Ambulatory Visit (INDEPENDENT_AMBULATORY_CARE_PROVIDER_SITE_OTHER): Payer: Medicare Other | Admitting: Cardiovascular Disease

## 2016-08-24 VITALS — BP 90/62 | HR 83 | Ht 62.0 in | Wt 156.5 lb

## 2016-08-24 DIAGNOSIS — F3181 Bipolar II disorder: Secondary | ICD-10-CM | POA: Diagnosis not present

## 2016-08-24 DIAGNOSIS — F17200 Nicotine dependence, unspecified, uncomplicated: Secondary | ICD-10-CM

## 2016-08-24 DIAGNOSIS — R42 Dizziness and giddiness: Secondary | ICD-10-CM | POA: Diagnosis not present

## 2016-08-24 DIAGNOSIS — F4310 Post-traumatic stress disorder, unspecified: Secondary | ICD-10-CM | POA: Diagnosis not present

## 2016-08-24 DIAGNOSIS — I951 Orthostatic hypotension: Secondary | ICD-10-CM

## 2016-08-24 DIAGNOSIS — R000 Tachycardia, unspecified: Secondary | ICD-10-CM

## 2016-08-24 DIAGNOSIS — F633 Trichotillomania: Secondary | ICD-10-CM | POA: Diagnosis not present

## 2016-08-24 NOTE — Progress Notes (Signed)
Cardiology Office Note  Date:  08/24/2016   ID:  Casey Hodges, DOB May 15, 1976, MRN [id number]  PCP:  Casey El, MD   Chief Complaint  Patient presents with  . other    Pt. c/o having rapid heart beats while walking on the treadmill, she also feels very dizzy when getting of the treadmill. Meds reviewed by the pt. verbally.     HPI:  Casey Hodges a pleasant 41 year old woman with long history of smoking from age 78 up to 41 years old, continues to smoke, previous episode of bronchitis, seen in the emergency room  given IV fluids and prednisone taper, patient of Casey Hodges seen in 2017 for heart murmur (not appreciated on exam), who presents today for lightheadedness, tachycardia  She reports that she has started working out at the gym for the past 3 weeks Typically does 1 hour on the treadmill with incline then does 1 hour of weights In general feels fine with no symptoms but has noticed tachycardia with heart rate up to 160 bpm when she is on the treadmill at times. When she gets off the treadmill has some lightheadedness, take some time to recover  Blood pressure on her last clinic visit was 98 systolic Blood pressure on today's visit 90/62, possibly even slightly lower on my recheck She reports getting over recent urinary tract infection, got a  in yeastfection which needed treatment as well   otherwise she feels fine and in her day-to-day activities does not have any lightheadedness or dizziness  She thinks she has lost several pounds by working out over the past month   continues to smoke, has not tried any over-the-counter modalities for smoking cessation   Denies any shortness of breath at baseline, no exercise intolerance   lost her husband suddenly, 2 years ago previously reported chronic pain issues  EKG on today's visit shows normal sinus rhythm with rate 83 bpm no significant ST or T-wave changes    PMH:   has a past medical history of Abnormal vaginal Pap smear;  Anxiety; AR (allergic rhinitis); Bipolar affective (HCC); Bipolar disorder (HCC); Eating disorder; Fibromyalgia; GERD (gastroesophageal reflux disease); Kidney stone; Painful intercourse; Painful menstrual periods; Pelvic pain in female; PTSD (post-traumatic stress disorder); Renal disorder; Spinal stenosis; Uterine polyp; and VWD (acquired von Willebrand's disease) (HCC).  PSH:    Past Surgical History:  Procedure Laterality Date  . ABDOMINAL HYSTERECTOMY    . HYSTEROSCOPY     removed polyps  . LAPAROSCOPIC VAGINAL HYSTERECTOMY  2015   at [facility]  . LAPAROSCOPY Left 01/10/2015   Procedure: LAPAROSCOPY OPERATIVE with biopsy, left oopherectomy;  Surgeon: Herold Harms, MD;  Location: [facility] ORS;  Service: Gynecology;  Laterality: Left;  . LAPAROSCOPY ABDOMEN DIAGNOSTIC    . TUBAL LIGATION      Current Outpatient Prescriptions  Medication Sig Dispense Refill  . ALPRAZolam (XANAX) 1 MG tablet Take 1 tablet by mouth 2 (two) times daily.    Marland Kitchen loperamide (IMODIUM A-D) 2 MG tablet Take 1 tablet (2 mg total) by mouth 4 (four) times daily as needed for diarrhea or loose stools. 12 tablet 0  . oxyCODONE-acetaminophen (PERCOCET) 7.5-325 MG tablet Take 1 tablet by mouth every 8 (eight) hours as needed. 90 tablet 0  . promethazine (PHENERGAN) 25 MG tablet Take 1 tablet (25 mg total) by mouth every 8 (eight) hours as needed for nausea or vomiting. 30 tablet 0  . zolpidem (AMBIEN) 10 MG tablet Take 1 tablet by mouth at  bedtime.     No current facility-administered medications for this visit.      Allergies:   Wellbutrin [bupropion hcl]; Augmentin [amoxicillin-pot clavulanate]; Penicillins; Vicoprofen  [hydrocodone-ibuprofen]; Lasix [furosemide]; and Nitrofurantoin monohyd macro   Social History:  The patient  reports that she has been smoking Cigarettes.  She started smoking about 21 years ago. She has been smoking about 0.50 packs per day. She has never used smokeless tobacco. She reports that  she does not drink alcohol or use drugs.   Family History:   family history includes Breast cancer in her paternal grandmother; Diabetes in her maternal aunt, maternal grandmother, and maternal uncle; Hypertension in her father; Sleep apnea in her mother.    Review of Systems: Review of Systems  Constitutional: Negative.   Respiratory: Negative.   Cardiovascular: Negative.        Tachycardia  Gastrointestinal: Negative.   Musculoskeletal: Negative.   Neurological: Positive for dizziness.  Psychiatric/Behavioral: Negative.   All other systems reviewed and are negative.    PHYSICAL EXAM: VS:  BP 90/62 (BP Location: Left Arm, Patient Position: Sitting, Cuff Size: Normal)   Pulse 83   Ht 5\' 2"  (1.575 m)   Wt 156 lb 8 oz (71 kg)   LMP 09/16/2015 Comment: neg preg test  BMI 28.62 kg/m  , BMI Body mass index is 28.62 kg/m. GEN: Well nourished, well developed, in no acute distress  HEENT: normal  Neck: no JVD, carotid bruits, or masses Cardiac: RRR; no murmurs, rubs, or gallops,no edema  Respiratory:  clear to auscultation bilaterally, normal work of breathing GI: soft, nontender, nondistended, + BS MS: no deformity or atrophy  Skin: warm and dry, no rash Neuro:  Strength and sensation are intact Psych: euthymic mood, full affect    Recent Labs: 09/21/2015: TSH 3.443 04/12/2016: ALT 15; BUN 9; Creatinine, Ser 0.65; Hemoglobin 16.2; Platelets 328; Potassium 3.9; Sodium 134    Lipid Panel Lab Results  Component Value Date   CHOL 166 03/06/2016   HDL 37 (L) 03/06/2016   LDLCALC 92 03/06/2016   TRIG 187 (H) 03/06/2016      Wt Readings from Last 3 Encounters:  08/24/16 156 lb 8 oz (71 kg)  08/22/16 151 lb (68.5 kg)  08/08/16 156 lb 3.2 oz (70.9 kg)       ASSESSMENT AND PLAN:  Smoker - Plan: EKG 12-Lead We have encouraged her to continue to work on weaning her cigarettes and smoking cessation. She will continue to work on this and does not want any assistance with  chantix.   Exercise-induced tachycardia - Plan: EKG 12-Lead Heart rate up to 160 on the treadmill likely an appropriate response to exercise Otherwise asymptomatic when she has tachycardia, just worried about the number Recommended if she is concerned about her heart rate running too fast that she could decrease the incline and speed. Strongly recommended she stay hydrated, not  avoid salt  Orthostasis Having lightheadedness when she stops on the treadmill likely from low blood pressure, orthostasis. Suggested she sit down or lay down for a short period of time in recovery, hydrate. Typically does not have symptoms when she is working on the weights for 1 hour, only when she gets off the treadmill.  --Symptoms possibly worse recently she is recovering from urinary tract infection which could drop her blood pressure. Recommended against rapid weight loss as this may exacerbate her hypotension --No further workup needed at this time, normal clinical findings, normal EKG We did suggest of symptoms  get worse we could perform routine treadmill study, echocardiogram. She will call us if she would like either of these   Total encounter time more than 25 minutes  Greater than 50% was spent in counseling and coordination of care with the patient   Disposition:   F/U as needed   Orders Placed This Encounter  Procedures  . EKG 12-Lead     Signed, Dossie Arbourim Rojean Ige, M.D., Ph.D. 08/24/2016  [facility], [place] [phone]

## 2016-08-24 NOTE — Patient Instructions (Signed)
Medication Instructions:   No medication changes made  Labwork:  No new labs needed  Testing/Procedures:  No further testing at this time   I recommend watching educational videos on topics of interest to you at:       [email or web address]  Enter code: [email or web address]    Follow-Up: It was a pleasure seeing you in the office today. Please call us if you have new issues that need to be addressed before your next appt.  [phone]  Your physician wants you to follow-up in:  as needed   If you need a refill on your cardiac medications before your next appointment, please call your pharmacy.     

## 2016-08-29 DIAGNOSIS — N200 Calculus of kidney: Secondary | ICD-10-CM | POA: Diagnosis not present

## 2016-08-29 DIAGNOSIS — R399 Unspecified symptoms and signs involving the genitourinary system: Secondary | ICD-10-CM | POA: Diagnosis not present

## 2016-08-29 DIAGNOSIS — N390 Urinary tract infection, site not specified: Secondary | ICD-10-CM | POA: Diagnosis not present

## 2016-09-09 ENCOUNTER — Emergency Department
Admission: EM | Admit: 2016-09-09 | Discharge: 2016-09-09 | Disposition: A | Discharge status: Home / Self Care | Payer: Medicare Other | Attending: Emergency Medicine | Admitting: Emergency Medicine

## 2016-09-09 ENCOUNTER — Emergency Department: Payer: Medicare Other

## 2016-09-09 ENCOUNTER — Encounter: Payer: Self-pay | Admitting: Emergency Medicine

## 2016-09-09 DIAGNOSIS — R1031 Right lower quadrant pain: Secondary | ICD-10-CM | POA: Insufficient documentation

## 2016-09-09 DIAGNOSIS — R109 Unspecified abdominal pain: Secondary | ICD-10-CM | POA: Diagnosis not present

## 2016-09-09 DIAGNOSIS — Z79899 Other long term (current) drug therapy: Secondary | ICD-10-CM | POA: Diagnosis not present

## 2016-09-09 DIAGNOSIS — F17210 Nicotine dependence, cigarettes, uncomplicated: Secondary | ICD-10-CM | POA: Diagnosis not present

## 2016-09-09 DIAGNOSIS — G8929 Other chronic pain: Secondary | ICD-10-CM | POA: Insufficient documentation

## 2016-09-09 DIAGNOSIS — N200 Calculus of kidney: Secondary | ICD-10-CM | POA: Diagnosis not present

## 2016-09-09 LAB — HEPATIC FUNCTION PANEL
ALT: 10 U/L — AB (ref 14–54)
AST: 19 U/L (ref 15–41)
Albumin: 3.5 g/dL (ref 3.5–5.0)
Alkaline Phosphatase: 56 U/L (ref 38–126)
Total Bilirubin: 0.4 mg/dL (ref 0.3–1.2)
Total Protein: 6.8 g/dL (ref 6.5–8.1)

## 2016-09-09 LAB — URINALYSIS, COMPLETE (UACMP) WITH MICROSCOPIC
Bilirubin Urine: NEGATIVE
GLUCOSE, UA: NEGATIVE mg/dL
Ketones, ur: NEGATIVE mg/dL
Leukocytes, UA: NEGATIVE
Nitrite: NEGATIVE
PROTEIN: NEGATIVE mg/dL
Specific Gravity, Urine: 1.016 (ref 1.005–1.030)
pH: 6 (ref 5.0–8.0)

## 2016-09-09 LAB — BASIC METABOLIC PANEL
ANION GAP: 4 — AB (ref 5–15)
BUN: 11 mg/dL (ref 6–20)
CO2: 25 mmol/L (ref 22–32)
Calcium: 8.9 mg/dL (ref 8.9–10.3)
Chloride: 109 mmol/L (ref 101–111)
Creatinine, Ser: 0.79 mg/dL (ref 0.44–1.00)
GFR calc Af Amer: >60 mL/min (ref >60)
Glucose, Bld: 100 mg/dL — ABNORMAL HIGH (ref 65–99)
POTASSIUM: 3.8 mmol/L (ref 3.5–5.1)
Sodium: 138 mmol/L (ref 135–145)

## 2016-09-09 LAB — CBC
HCT: 40.9 % (ref 35.0–47.0)
HEMOGLOBIN: 14 g/dL (ref 12.0–16.0)
MCH: 30.5 pg (ref 26.0–34.0)
MCHC: 34.1 g/dL (ref 32.0–36.0)
MCV: 89.4 fL (ref 80.0–100.0)
Platelets: 257 10*3/uL (ref 150–440)
RBC: 4.58 MIL/uL (ref 3.80–5.20)
RDW: 14.1 % (ref 11.5–14.5)
WBC: 6.9 10*3/uL (ref 3.6–11.0)

## 2016-09-09 MED ORDER — ONDANSETRON HCL 4 MG/2ML IJ SOLN
INTRAMUSCULAR | Status: AC
  Administered 2016-09-09: 4 mg via INTRAVENOUS
  Filled 2016-09-09: qty 2

## 2016-09-09 MED ORDER — KETOROLAC TROMETHAMINE 30 MG/ML IJ SOLN
15.0000 mg | Freq: Once | INTRAMUSCULAR | Status: AC
  Administered 2016-09-09: 15 mg via INTRAVENOUS
  Filled 2016-09-09: qty 1

## 2016-09-09 MED ORDER — ONDANSETRON HCL 4 MG/2ML IJ SOLN
4.0000 mg | Freq: Once | INTRAMUSCULAR | Status: AC | PRN
  Administered 2016-09-09: 4 mg via INTRAVENOUS

## 2016-09-09 NOTE — ED Triage Notes (Signed)
Pt reports right flank pain x 3 weeks, pt been to urgent care before, but no relief. Pt states painful to urinate, no blood seen. Pt has [facility] the past and was told she had blood in her urine.

## 2016-09-09 NOTE — ED Notes (Signed)
Patient transported to CT 

## 2016-09-09 NOTE — ED Notes (Signed)
Pt verbalized understanding of discharge instructions. NAD at this time. 

## 2016-09-09 NOTE — ED Provider Notes (Addendum)
[facility] Emergency Department Provider Note  ____________________________________________   I have reviewed the triage vital signs and the nursing notes.   HISTORY  Chief Complaint Flank Pain    HPI Casey Hodges is a 41 y.o. female who presents today complaining of right-sided flank pain for one month. Patient has a history of chronic pain. She has a history of fibromyalgia, she does have kidney stones in the past. She had a cyst history of chronic painful periods which resulted in a hysterectomy, she has a history of lysis of adhesion surgery, patient has been to also different providers in the last 3 weeks because of her flank pain. She was given antibiotics but rhythm was told she had a negative culture and so the antibiotics. Ears she does not dysuria. She does has ongoing flank pain on the right. She denies any fever or chills or vomiting or diarrhea. The pain has been steady every day for 3 weeks to a month.      Past Medical History:  Diagnosis Date  . Abnormal vaginal Pap smear    10+ years ago- no colpo repeat was normal  . Anxiety   . AR (allergic rhinitis)   . Bipolar affective (HCC)    pt reported  . Bipolar disorder (HCC)   . Eating disorder    Under control per patient  . Fibromyalgia   . GERD (gastroesophageal reflux disease)   . Kidney stone   . Painful intercourse   . Painful menstrual periods   . Pelvic pain in female   . PTSD (post-traumatic stress disorder)   . Renal disorder   . Spinal stenosis   . Uterine polyp   . VWD (acquired von Willebrand's disease) [facility])     Patient Active Problem List   Diagnosis Date Noted  . Orthostasis 08/24/2016  . Antibiotic-induced yeast infection 08/22/2016  . Exercise-induced tachycardia 08/22/2016  . Productive cough 08/08/2016  . Well woman exam without gynecological exam 03/06/2016  . Acute pain of left hip 12/07/2015  . Cellulitis of head or scalp 11/16/2015  . Constipation due  to opioid therapy 11/04/2015  . Cellulitis of face 09/21/2015  . Severe episode of recurrent major depressive disorder, without psychotic features (HCC)   . GERD (gastroesophageal reflux disease) 09/16/2015  . Sepsis (HCC) 09/16/2015  . Facial cellulitis 09/16/2015  . Cervical spine pain 09/08/2015  . Smoker 07/25/2015  . Upper respiratory infection 07/12/2015  . Snoring 06/09/2015  . Cardiac murmur 05/25/2015  . Cloudy urine 05/25/2015  . Numbness of foot 04/20/2015  . Drug-induced nausea and vomiting 03/22/2015  . Need for immunization against influenza 03/21/2015  . Cyst, bone 02/14/2015  . Pelvic adhesive disease 01/21/2015  . Tick bite 01/20/2015  . Arthritis 12/19/2014  . Calculus of kidney 12/13/2014  . Patchy loss of hair 12/13/2014  . Anxiety disorder 12/13/2014  . Postural dizziness 12/13/2014  . Subcutaneous cyst 12/13/2014  . Nephrolithiasis 05/23/2012  . Chronic pelvic pain in female 05/23/2012  . Fibromyalgia 05/23/2012  . Bipolar disorder with depression (HCC) 06/05/2011    Past Surgical History:  Procedure Laterality Date  . ABDOMINAL HYSTERECTOMY    . HYSTEROSCOPY     removed polyps  . LAPAROSCOPIC VAGINAL HYSTERECTOMY  2015   at [facility]  . LAPAROSCOPY Left 01/10/2015   Procedure: LAPAROSCOPY OPERATIVE with biopsy, left oopherectomy;  Surgeon: Herold Harms, MD;  Location: [facility] ORS;  Service: Gynecology;  Laterality: Left;  . LAPAROSCOPY ABDOMEN DIAGNOSTIC    .  TUBAL LIGATION      Prior to Admission medications   Medication Sig Start Date End Date Taking? Authorizing Provider  ALPRAZolam Prudy Feeler) 0.5 MG tablet Take 1 tablet by mouth at bedtime.  04/03/16  Yes Historical Provider, MD  ibuprofen (ADVIL,MOTRIN) 200 MG tablet Take 200 mg by mouth every 6 (six) hours as needed.   Yes Historical Provider, MD  traZODone (DESYREL) 100 MG tablet Take 200 mg by mouth at bedtime. 07/19/16  Yes Historical Provider, MD  zolpidem (AMBIEN) 10 MG tablet Take 1  tablet by mouth at bedtime. 03/27/16  Yes Historical Provider, MD  loperamide (IMODIUM A-D) 2 MG tablet Take 1 tablet (2 mg total) by mouth 4 (four) times daily as needed for diarrhea or loose stools. Patient not taking: Reported on 09/09/2016 04/12/16   Rockne Menghini, MD  oxyCODONE-acetaminophen (PERCOCET) 7.5-325 MG tablet Take 1 tablet by mouth every 8 (eight) hours as needed. Patient not taking: Reported on 09/09/2016 08/22/16   Ellyn Hack, MD  promethazine (PHENERGAN) 25 MG tablet Take 1 tablet (25 mg total) by mouth every 8 (eight) hours as needed for nausea or vomiting. Patient not taking: Reported on 09/09/2016 07/23/16   Ellyn Hack, MD    Allergies Wellbutrin [bupropion hcl]; Augmentin [amoxicillin-pot clavulanate]; Penicillins; Vicoprofen  [hydrocodone-ibuprofen]; Lasix [furosemide]; and Nitrofurantoin monohyd macro  Family History  Problem Relation Age of Onset  . Hypertension Father   . Sleep apnea Mother   . Breast cancer Paternal Grandmother   . Diabetes Maternal Grandmother   . Diabetes Maternal Aunt   . Diabetes Maternal Uncle     Social History Social History  Substance Use Topics  . Smoking status: Current Every Day Smoker    Packs/day: 0.50    Types: Cigarettes    Start date: 05/18/1995  . Smokeless tobacco: Never Used  . Alcohol use No     Comment: Socially    Review of Systems Constitutional: No fever/chills Eyes: No visual changes. ENT: No sore throat. No stiff neck no neck pain Cardiovascular: Denies chest pain. Respiratory: Denies shortness of breath. Gastrointestinal:   no vomiting.  No diarrhea.  No constipation. Genitourinary: Negative for dysuria. Musculoskeletal: Negative lower extremity swelling Skin: Negative for rash. Neurological: Negative for severe headaches, focal weakness or numbness. 10-point ROS otherwise negative.  ____________________________________________   PHYSICAL EXAM:  VITAL SIGNS: ED Triage Vitals  Enc  Vitals Group     BP 09/09/16 0938 132/78     Pulse Rate 09/09/16 0938 (!) 107     Resp 09/09/16 0938 18     Temp 09/09/16 0938 98 F (36.7 C)     Temp Source 09/09/16 0938 Oral     SpO2 09/09/16 0938 100 %     Weight 09/09/16 0939 156 lb (70.8 kg)     Height 09/09/16 0939 5\' 2"  (1.575 m)     Head Circumference --      Peak Flow --      Pain Score 09/09/16 0939 10     Pain Loc --      Pain Edu? --      Excl. in GC? --     Constitutional: Alert and oriented. Well appearing and in no acute distress. Eyes: Conjunctivae are normal. PERRL. EOMI. Head: Atraumatic. Nose: No congestion/rhinnorhea. Mouth/Throat: Mucous membranes are moist.  Oropharynx non-erythematous. Neck: No stridor.   Nontender with no meningismus Cardiovascular: Normal rate, regular rhythm. Grossly normal heart sounds.  Good peripheral circulation. Respiratory: Normal respiratory effort.  No retractions. Lungs CTAB. Abdominal: Soft and nontender. No distention. No guarding no rebound Back:  There is no focal tenderness or step off.  there is no midline tenderness there are no lesions noted. there is mild distractible right CVA tenderness Musculoskeletal: No lower extremity tenderness, no upper extremity tenderness. No joint effusions, no DVT signs strong distal pulses no edema Neurologic:  Normal speech and language. No gross focal neurologic deficits are appreciated.  Skin:  Skin is warm, dry and intact. No rash noted. Psychiatric: Mood and affect are normal. Speech and behavior are normal.  ____________________________________________   LABS (all labs ordered are listed, but only abnormal results are displayed)  Labs Reviewed  URINALYSIS, COMPLETE (UACMP) WITH MICROSCOPIC - Abnormal; Notable for the following:       Result Value   Color, Urine AMBER (*)    APPearance CLOUDY (*)    Hgb urine dipstick SMALL (*)    Bacteria, UA MANY (*)    Squamous Epithelial / LPF 6-30 (*)    All other components within  normal limits  BASIC METABOLIC PANEL - Abnormal; Notable for the following:    Glucose, Bld 100 (*)    Anion gap 4 (*)    All other components within normal limits  CBC   ____________________________________________  EKG  I personally interpreted any EKGs ordered by me or triage  ____________________________________________  RADIOLOGY  I reviewed any imaging ordered by me or triage that were performed during my shift and, if possible, patient and/or family made aware of any abnormal findings. ____________________________________________   PROCEDURES  Procedure(s) performed: None  Procedures  Critical Care performed: None  ____________________________________________   INITIAL IMPRESSION / ASSESSMENT AND PLAN / ED COURSE  Pertinent labs & imaging results that were available during my care of the patient were reviewed by me and considered in my medical decision making (see chart for details).  Patient with chronic right-sided light pain in the context of chronic pain in general. Patient had tramadol given to her and March 7 oxycodone on February 28, 90 pills, she also had Hydromet syrup on February 14, hydrocodone home at 16 syrup prescribed on February 11, multiple different Xanax prescriptions during this time, presents today with ongoing right-sided flank pain which is clinically not impressive on exam. Nonetheless I did a CT scan which does not show any acute pathology, blood work is reassuring urine is reassuring BMP is reassuring CBC is reassuring. Patient is asking for "stronger" pain meds. At this time I don't see any indication for narcotic pain medication. We will discharge her with close outpatient follow-up with urology.  ----------------------------------------- 12:47 PM on 09/09/2016 -----------------------------------------  Patient was sleeping in the room, she woke up and began to request narcotic pain medication. At this point her that I cannot give her  narcotic pain medications unfortunately given her history. We will have her follow closely however with her pain control doctor. I do not control providing narcotics. She states that she doesn't open with them next week but wants narcotics to "get her through" IV explained to her that is not something we are likely going to be able to do but we are very happy to have her come back to the emergency room for any new or worrisome symptoms. Patient is a lower back pain/flank pain which is been there for one month. Her abdomen is completely benign nothing to suggest color disease etc. We'll have her closely follow up as an outpatient. Return precautions and follow given and  understood.    ____________________________________________   FINAL CLINICAL IMPRESSION(S) / ED DIAGNOSES  Final diagnoses:  None      This chart was dictated using voice recognition software.  Despite best efforts to proofread,  errors can occur which can change meaning.      Jeanmarie Plant, MD 09/09/16 1146    Jeanmarie Plant, MD 09/09/16 1248

## 2016-09-09 NOTE — ED Notes (Signed)
Pt requesting pain medication. Dr  Bella Kennedymschane aware

## 2016-09-09 NOTE — ED Notes (Addendum)
Pt laying in bed with eyes closed.

## 2016-09-18 ENCOUNTER — Ambulatory Visit: Payer: Self-pay | Admitting: Family Medicine

## 2016-09-18 ENCOUNTER — Telehealth: Payer: Self-pay

## 2016-09-18 DIAGNOSIS — R109 Unspecified abdominal pain: Secondary | ICD-10-CM | POA: Diagnosis not present

## 2016-09-18 DIAGNOSIS — R112 Nausea with vomiting, unspecified: Secondary | ICD-10-CM

## 2016-09-18 DIAGNOSIS — Z87442 Personal history of urinary calculi: Secondary | ICD-10-CM | POA: Diagnosis not present

## 2016-09-18 DIAGNOSIS — F17200 Nicotine dependence, unspecified, uncomplicated: Secondary | ICD-10-CM | POA: Diagnosis not present

## 2016-09-18 DIAGNOSIS — T50905A Adverse effect of unspecified drugs, medicaments and biological substances, initial encounter: Principal | ICD-10-CM

## 2016-09-18 DIAGNOSIS — R319 Hematuria, unspecified: Secondary | ICD-10-CM | POA: Diagnosis not present

## 2016-09-18 DIAGNOSIS — R638 Other symptoms and signs concerning food and fluid intake: Secondary | ICD-10-CM | POA: Diagnosis not present

## 2016-09-18 MED ORDER — ALPRAZOLAM 0.5 MG PO TABS
0.5000 mg | ORAL_TABLET | Freq: Every day | ORAL | 0 refills | Status: DC
Start: 2016-09-18 — End: 2017-01-01

## 2016-09-18 MED ORDER — PROMETHAZINE HCL 25 MG PO TABS: 25.0000 mg | ORAL_TABLET | Freq: Three times a day (TID) | ORAL | 0 refills | Status: DC | PRN

## 2016-09-18 NOTE — Telephone Encounter (Signed)
Phenergan 25 mg and Alprazolam 0.5 mg has been called into [facility] [address]

## 2016-09-19 ENCOUNTER — Ambulatory Visit: Payer: Self-pay | Admitting: Family Medicine

## 2016-09-20 ENCOUNTER — Telehealth: Payer: Self-pay | Admitting: Family Medicine

## 2016-09-20 ENCOUNTER — Ambulatory Visit: Payer: Self-pay | Admitting: Family Medicine

## 2016-09-20 ENCOUNTER — Ambulatory Visit (INDEPENDENT_AMBULATORY_CARE_PROVIDER_SITE_OTHER): Payer: Medicare Other | Admitting: Family Medicine

## 2016-09-20 ENCOUNTER — Encounter: Payer: Self-pay | Admitting: Family Medicine

## 2016-09-20 VITALS — BP 131/80 | HR 97 | Temp 98.5°F | Resp 16 | Ht 62.0 in | Wt 154.6 lb

## 2016-09-20 DIAGNOSIS — F411 Generalized anxiety disorder: Secondary | ICD-10-CM | POA: Diagnosis not present

## 2016-09-20 DIAGNOSIS — N200 Calculus of kidney: Secondary | ICD-10-CM

## 2016-09-20 DIAGNOSIS — M797 Fibromyalgia: Secondary | ICD-10-CM | POA: Diagnosis not present

## 2016-09-20 MED ORDER — PREGABALIN 150 MG PO CAPS: 150.0000 mg | ORAL_CAPSULE | Freq: Three times a day (TID) | ORAL | 0 refills | Status: DC | PRN

## 2016-09-20 MED ORDER — OXYCODONE-ACETAMINOPHEN 7.5-325 MG PO TABS: 1.0000 | ORAL_TABLET | Freq: Three times a day (TID) | ORAL | 0 refills | Status: AC | PRN

## 2016-09-20 NOTE — Telephone Encounter (Signed)
Referral was [facility] by [facility]tik [facility]. [facility] was [facility] to [facility].

## 2016-09-20 NOTE — Progress Notes (Signed)
Name: Casey Hodges   MRN: [id number]    DOB: 09/22/1975   Date:09/20/2016       Progress Note  Subjective  Chief Complaint  Chief Complaint  Patient presents with  . Medication Refill    HPI  Fibromyalgia: Pt. Is here for follow up of fibromyalgia, chronic condition, multiple arthralgias including in the lower back, neck, shoulders, hips, and knees. She has recently been to [facility] ER and [facility] ER for left sided low back pain, was diagnosed with a kidney stone at [facility] ER. Her acute pain is much better at this time but comes and goes, history of hematuria some nausea and vomiting.  She takes Oxycodone-Acetaminophen 7.5-325 mg every 8 hours as needed for pain along with Lyrica 150 mg three times daily.   Past Medical History:  Diagnosis Date  . Abnormal vaginal Pap smear    10+ years ago- no colpo repeat was normal  . Anxiety   . AR (allergic rhinitis)   . Bipolar affective (HCC)    pt reported  . Bipolar disorder (HCC)   . Eating disorder    Under control per patient  . Fibromyalgia   . GERD (gastroesophageal reflux disease)   . Kidney stone   . Painful intercourse   . Painful menstrual periods   . Pelvic pain in female   . PTSD (post-traumatic stress disorder)   . Renal disorder   . Spinal stenosis   . Uterine polyp   . VWD (acquired von Willebrand's disease) [facility])     Past Surgical History:  Procedure Laterality Date  . ABDOMINAL HYSTERECTOMY    . HYSTEROSCOPY     removed polyps  . LAPAROSCOPIC VAGINAL HYSTERECTOMY  2015   at [facility]  . LAPAROSCOPY Left 01/10/2015   Procedure: LAPAROSCOPY OPERATIVE with biopsy, left oopherectomy;  Surgeon: Herold Harms, MD;  Location: [facility] ORS;  Service: Gynecology;  Laterality: Left;  . LAPAROSCOPY ABDOMEN DIAGNOSTIC    . TUBAL LIGATION      Family History  Problem Relation Age of Onset  . Hypertension Father   . Sleep apnea Mother   . Breast cancer Paternal Grandmother   . Diabetes Maternal Grandmother   . Diabetes  Maternal Aunt   . Diabetes Maternal Uncle     Social History   Social History  . Marital status: Widowed    Spouse name: N/A  . Number of children: 1  . Years of education: N/A   Occupational History  . Un-employed due to bipolar    Social History Main Topics  . Smoking status: Current Every Day Smoker    Packs/day: 0.50    Types: Cigarettes    Start date: 05/18/1995  . Smokeless tobacco: Never Used  . Alcohol use No     Comment: Socially  . Drug use: No  . Sexual activity: No   Other Topics Concern  . Not on file   Social History Narrative   Regular Exercise -  NO   Daily Caffeine Use:  1 cup coffee in am      1 child, one step child           Current Outpatient Prescriptions:  .  ALPRAZolam (XANAX) 0.5 MG tablet, Take 1 tablet (0.5 mg total) by mouth at bedtime., Disp: 30 tablet, Rfl: 0 .  ibuprofen (ADVIL,MOTRIN) 200 MG tablet, Take 200 mg by mouth every 6 (six) hours as needed., Disp: , Rfl:  .  loperamide (IMODIUM A-D) 2 MG tablet, Take 1  tablet (2 mg total) by mouth 4 (four) times daily as needed for diarrhea or loose stools., Disp: 12 tablet, Rfl: 0 .  oxyCODONE-acetaminophen (PERCOCET) 7.5-325 MG tablet, Take 1 tablet by mouth every 8 (eight) hours as needed., Disp: 90 tablet, Rfl: 0 .  promethazine (PHENERGAN) 25 MG tablet, Take 1 tablet (25 mg total) by mouth every 8 (eight) hours as needed for nausea or vomiting., Disp: 30 tablet, Rfl: 0 .  traZODone (DESYREL) 100 MG tablet, Take 200 mg by mouth at bedtime., Disp: , Rfl:  .  zolpidem (AMBIEN) 10 MG tablet, Take 1 tablet by mouth at bedtime., Disp: , Rfl:   Allergies  Allergen Reactions  . Wellbutrin [Bupropion Hcl] Other (See Comments)    Reaction:  Suicidal   . Augmentin [Amoxicillin-Pot Clavulanate] Diarrhea, Nausea And Vomiting and Other (See Comments)    Has patient had a PCN reaction causing immediate rash, facial/tongue/throat swelling, SOB or lightheadedness with hypotension: No Has patient had  a PCN reaction causing severe rash involving mucus membranes or skin necrosis: No Has patient had a PCN reaction that required hospitalization No Has patient had a PCN reaction occurring within the last 10 years: Yes If all of the above answers are "NO", then may proceed with Cephalosporin use.  Marland Kitchen. Penicillins Diarrhea, Nausea And Vomiting and Other (See Comments)    Has patient had a PCN reaction causing immediate rash, facial/tongue/throat swelling, SOB or lightheadedness with hypotension: No Has patient had a PCN reaction causing severe rash involving mucus membranes or skin necrosis: No Has patient had a PCN reaction that required hospitalization No Has patient had a PCN reaction occurring within the last 10 years: Yes If all of the above answers are "NO", then may proceed with Cephalosporin use.  [name]. Vicoprofen  [Hydrocodone-Ibuprofen] Nausea And Vomiting  . Lasix [Furosemide] Rash  . Nitrofurantoin Monohyd Macro Rash     ROS  Please see history of present illness for complete discussion of ROS  Objective  Vitals:   09/20/16 0905  BP: 131/80  Pulse: 97  Resp: 16  Temp: 98.5 F (36.9 C)  TempSrc: Oral  SpO2: 97%  Weight: 154 lb 9.6 oz (70.1 kg)  Height: 5\' 2"  (1.575 m)    Physical Exam  Constitutional: She is oriented to person, place, and time and well-developed, well-nourished, and in no distress.  Cardiovascular: Normal rate, regular rhythm and normal heart sounds.   No murmur heard. Pulmonary/Chest: Effort normal and breath sounds normal. She has no wheezes.  Musculoskeletal:       Right shoulder: She exhibits tenderness.       Left shoulder: She exhibits tenderness.       Right hip: She exhibits tenderness.       Left hip: She exhibits tenderness.       Lumbar back: She exhibits tenderness.  Neurological: She is alert and oriented to person, place, and time.  Nursing note and vitals reviewed.    Assessment & Plan  1. Kidney stone on left side Reviewed CT  scan which showed a 4 mm left renal stone, referral to urology - Ambulatory referral to Urology  2. Fibromyalgia  Based on the [place] controlled substances registry system, patient has been obtaining alprazolam from this provider along with her psychiatrist, which is a violation of controlled substances agreement. Advised that we'll stop prescribing all controlled substances and referred to pain clinic for continued management of chronic pain due to fibromyalgia. Prescription for Lyrica and oxycodone provided for 2 weeks  so that patient may avoid withdrawal symptoms due to discontinuation of opioid therapy.  - pregabalin (LYRICA) 150 MG capsule; Take 1 capsule (150 mg total) by mouth 3 (three) times daily as needed.  Dispense: 45 capsule; Refill: 0 - oxyCODONE-acetaminophen (PERCOCET) 7.5-325 MG tablet; Take 1 tablet by mouth every 8 (eight) hours as needed.  Dispense: 45 tablet; Refill: 0 - Ambulatory referral to Pain Clinic  3. Generalized anxiety disorder  As above, patient has been followed by her psychiatrist and has received alprazolam prescription from both a psychiatrist and this provider. This constitutes violation of controlled substances agreement and will stop prescribing any controlled substances at this time. Patient to continue with psychiatrist for management of anxiety disorder.  Sophya Vanblarcom Asad A[facility] 09/20/2016 9:15 AM

## 2016-09-20 NOTE — Telephone Encounter (Signed)
Pt is requesting a call back about her referral.

## 2016-10-09 ENCOUNTER — Encounter: Payer: Self-pay | Admitting: Family Medicine

## 2016-10-09 ENCOUNTER — Ambulatory Visit (INDEPENDENT_AMBULATORY_CARE_PROVIDER_SITE_OTHER): Payer: Medicare Other | Admitting: Family Medicine

## 2016-10-09 VITALS — BP 112/70 | HR 102 | Temp 98.3°F | Resp 16 | Ht 62.0 in | Wt 145.2 lb

## 2016-10-09 DIAGNOSIS — N3001 Acute cystitis with hematuria: Secondary | ICD-10-CM

## 2016-10-09 DIAGNOSIS — M797 Fibromyalgia: Secondary | ICD-10-CM

## 2016-10-09 LAB — POCT URINALYSIS DIPSTICK
Bilirubin, UA: NEGATIVE
Glucose, UA: NEGATIVE
Leukocytes, UA: NEGATIVE
NITRITE UA: NEGATIVE
PH UA: 5 (ref 5.0–8.0)
PROTEIN UA: NEGATIVE
Spec Grav, UA: 1.02 (ref 1.010–1.025)
Urobilinogen, UA: NEGATIVE E.U./dL — AB

## 2016-10-09 MED ORDER — PREGABALIN 150 MG PO CAPS
150.0000 mg | ORAL_CAPSULE | Freq: Three times a day (TID) | ORAL | 0 refills | Status: DC | PRN
Start: 2016-10-09 — End: 2017-01-21

## 2016-10-09 MED ORDER — OXYCODONE-ACETAMINOPHEN 7.5-325 MG PO TABS: 1.0000 | ORAL_TABLET | Freq: Three times a day (TID) | ORAL | 0 refills | Status: DC | PRN

## 2016-10-09 MED ORDER — CIPROFLOXACIN HCL 500 MG PO TABS: 500.0000 mg | ORAL_TABLET | Freq: Two times a day (BID) | ORAL | 0 refills | Status: AC

## 2016-10-09 NOTE — Progress Notes (Signed)
Name: Casey Hodges   MRN: [id number]    DOB: 1976/03/16   Date:10/09/2016       Progress Note  Subjective  Chief Complaint  Chief Complaint  Patient presents with  . Urinary Tract Infection    Urinary Tract Infection   This is a new problem. The current episode started in the past 7 days (2 days ago). The quality of the pain is described as burning. There has been no fever. Associated symptoms include flank pain (ongoing flank pain, dx w/ left renal stone, referred to urology.). Pertinent negatives include no hematuria. She has tried home medications (has been taking Azo) for the symptoms. The treatment provided mild relief.   Patient is also requesting medication for fibromyalgia, has chronic widespread pain including hips, knees, shoulders. Recently, pain is worse with the possible UTI, patient was found to be in violation of controlled substances agreement at last visit and we prescribed 2 weeks off Lyrica and oxycodone, referred to pain clinic. She has obtain appointment at pain clinic on 11/12/2016, states that she is feeling really bad, undergoing withdrawal symptoms, requesting to provide enough medication to last until appointment with pain clinic  Past Medical History:  Diagnosis Date  . Abnormal vaginal Pap smear    10+ years ago- no colpo repeat was normal  . Anxiety   . AR (allergic rhinitis)   . Bipolar affective (HCC)    pt reported  . Bipolar disorder (HCC)   . Eating disorder    Under control per patient  . Fibromyalgia   . GERD (gastroesophageal reflux disease)   . Kidney stone   . Painful intercourse   . Painful menstrual periods   . Pelvic pain in female   . PTSD (post-traumatic stress disorder)   . Renal disorder   . Spinal stenosis   . Uterine polyp   . VWD (acquired von Willebrand's disease) [facility])     Past Surgical History:  Procedure Laterality Date  . ABDOMINAL HYSTERECTOMY    . HYSTEROSCOPY     removed polyps  . LAPAROSCOPIC VAGINAL HYSTERECTOMY   2015   at [facility]  . LAPAROSCOPY Left 01/10/2015   Procedure: LAPAROSCOPY OPERATIVE with biopsy, left oopherectomy;  Surgeon: Herold Harms, MD;  Location: [facility] ORS;  Service: Gynecology;  Laterality: Left;  . LAPAROSCOPY ABDOMEN DIAGNOSTIC    . TUBAL LIGATION      Family History  Problem Relation Age of Onset  . Hypertension Father   . Sleep apnea Mother   . Breast cancer Paternal Grandmother   . Diabetes Maternal Grandmother   . Diabetes Maternal Aunt   . Diabetes Maternal Uncle     Social History   Social History  . Marital status: Widowed    Spouse name: N/A  . Number of children: 1  . Years of education: N/A   Occupational History  . Un-employed due to bipolar    Social History Main Topics  . Smoking status: Current Every Day Smoker    Packs/day: 0.50    Types: Cigarettes    Start date: 05/18/1995  . Smokeless tobacco: Never Used  . Alcohol use No     Comment: Socially  . Drug use: No  . Sexual activity: No   Other Topics Concern  . Not on file   Social History Narrative   Regular Exercise -  NO   Daily Caffeine Use:  1 cup coffee in am      1 child, one step child  Current Outpatient Prescriptions:  .  ALPRAZolam (XANAX) 0.5 MG tablet, Take 1 tablet (0.5 mg total) by mouth at bedtime., Disp: 30 tablet, Rfl: 0 .  ibuprofen (ADVIL,MOTRIN) 200 MG tablet, Take 200 mg by mouth every 6 (six) hours as needed., Disp: , Rfl:  .  loperamide (IMODIUM A-D) 2 MG tablet, Take 1 tablet (2 mg total) by mouth 4 (four) times daily as needed for diarrhea or loose stools., Disp: 12 tablet, Rfl: 0 .  promethazine (PHENERGAN) 25 MG tablet, Take 1 tablet (25 mg total) by mouth every 8 (eight) hours as needed for nausea or vomiting., Disp: 30 tablet, Rfl: 0 .  traZODone (DESYREL) 100 MG tablet, Take 200 mg by mouth at bedtime., Disp: , Rfl:  .  zolpidem (AMBIEN) 10 MG tablet, Take 1 tablet by mouth at bedtime., Disp: , Rfl:  .  pregabalin (LYRICA) 150 MG  capsule, Take 1 capsule (150 mg total) by mouth 3 (three) times daily as needed., Disp: 45 capsule, Rfl: 0  Allergies  Allergen Reactions  . Wellbutrin [Bupropion Hcl] Other (See Comments)    Reaction:  Suicidal   . Augmentin [Amoxicillin-Pot Clavulanate] Diarrhea, Nausea And Vomiting and Other (See Comments)    Has patient had a PCN reaction causing immediate rash, facial/tongue/throat swelling, SOB or lightheadedness with hypotension: No Has patient had a PCN reaction causing severe rash involving mucus membranes or skin necrosis: No Has patient had a PCN reaction that required hospitalization No Has patient had a PCN reaction occurring within the last 10 years: Yes If all of the above answers are "NO", then may proceed with Cephalosporin use.  Marland Kitchen Penicillins Diarrhea, Nausea And Vomiting and Other (See Comments)    Has patient had a PCN reaction causing immediate rash, facial/tongue/throat swelling, SOB or lightheadedness with hypotension: No Has patient had a PCN reaction causing severe rash involving mucus membranes or skin necrosis: No Has patient had a PCN reaction that required hospitalization No Has patient had a PCN reaction occurring within the last 10 years: Yes If all of the above answers are "NO", then may proceed with Cephalosporin use.  [name]  [Hydrocodone-Ibuprofen] Nausea And Vomiting  . Lasix [Furosemide] Rash  . Nitrofurantoin Monohyd Macro Rash     Review of Systems  Genitourinary: Positive for flank pain (ongoing flank pain, dx w/ left renal stone, referred to urology.). Negative for hematuria.     Objective  Vitals:   10/09/16 1103  BP: 112/70  Pulse: (!) 102  Resp: 16  Temp: 98.3 F (36.8 C)  TempSrc: Oral  SpO2: 98%  Weight: 145 lb 3.2 oz (65.9 kg)  Height:  (1.575 m)    Physical Exam  Constitutional: She is well-developed, well-nourished, and in no distress.  HENT:  Head: Normocephalic and atraumatic.  Cardiovascular: Regular  rhythm, S1 normal and S2 normal.  Tachycardia present.   Pulmonary/Chest: Effort normal and breath sounds normal. No respiratory distress. She has no decreased breath sounds. She has no wheezes.  Abdominal: Soft. Bowel sounds are normal. There is generalized tenderness. There is CVA tenderness.  Psychiatric: Memory and judgment normal. Her mood appears anxious. She exhibits a depressed mood. She has a flat affect.  Nursing note and vitals reviewed.    Assessment & Plan  1. Acute cystitis with hematuria Urinalysis shows moderate blood, negative for leukocytes and nitrites, based on symptoms, we'll start on ciprofloxacin 500 mg twice a day, should also increase fluid intake, send urine for analysis and culture at the  lab - POCT urinalysis dipstick - ciprofloxacin (CIPRO) 500 MG tablet; Take 1 tablet (500 mg total) by mouth 2 (two) times daily.  Dispense: 14 tablet; Refill: 0 - Urinalysis, Routine w reflex microscopic - Urine Culture  2. Fibromyalgia [place] controlled substances Registry query was again performed, results are consistent, should her last opiate prescription was March 29 by this provider. I explained to the patient that she has had 2 different addresses on file with the providers including the psychiatrist based on the [place] controlled substances Registry. Patient got upset and stated that she was not aware that she had to change the addresses on file with every provider and that she owns a condo that she used to live in and then moved later on to her current residence and that some providers may have her previous address. Patient stated that she feels as if "looking for things in her record", I explained that the information is listed exactly as it is available in the [employer] controlled substances Registry and I even showed her a printed copy. I have explained that I will provide 2 week prescription of oxycodone and Lyrica at her previous dosages and that she must contact the pain  management provider to schedule an earlier appointment, patient verbalized agreement.  - pregabalin (LYRICA) 150 MG capsule; Take 1 capsule (150 mg total) by mouth 3 (three) times daily as needed.  Dispense: 45 capsule; Refill: 0 - oxyCODONE-acetaminophen (PERCOCET) 7.5-325 MG tablet; Take 1 tablet by mouth every 8 (eight) hours as needed for severe pain.  Dispense: 45 tablet; Refill: 0   Hargun Spurling Asad A[facility] 10/09/2016 11:25 AM

## 2016-10-10 LAB — URINALYSIS, ROUTINE W REFLEX MICROSCOPIC
Bilirubin Urine: NEGATIVE
Glucose, UA: NEGATIVE
LEUKOCYTES UA: NEGATIVE
NITRITE: POSITIVE — AB
PH: 6 (ref 5.0–8.0)
Specific Gravity, Urine: 1.028 (ref 1.001–1.035)

## 2016-10-10 LAB — URINALYSIS, MICROSCOPIC ONLY
CRYSTALS: NONE SEEN [HPF]
Casts: NONE SEEN [LPF]
Squamous Epithelial / LPF: NONE SEEN [HPF] (ref <=5)
WBC UA: NONE SEEN WBC/HPF (ref <=5)
Yeast: NONE SEEN [HPF]

## 2016-10-10 LAB — URINE CULTURE: Organism ID, Bacteria: NO GROWTH

## 2016-10-12 ENCOUNTER — Other Ambulatory Visit: Payer: Self-pay | Admitting: Family Medicine

## 2016-10-12 MED ORDER — FLUCONAZOLE 150 MG PO TABS: 150.0000 mg | ORAL_TABLET | Freq: Once | ORAL | 0 refills | Status: AC

## 2016-10-12 NOTE — Progress Notes (Signed)
Rx for diflucan Rxd

## 2016-10-15 ENCOUNTER — Encounter: Payer: Self-pay | Admitting: Urology

## 2016-10-15 ENCOUNTER — Ambulatory Visit: Payer: Medicare Other | Admitting: Urology

## 2016-10-17 ENCOUNTER — Encounter: Payer: Self-pay | Admitting: Certified Nurse Midwife

## 2016-10-17 ENCOUNTER — Ambulatory Visit (INDEPENDENT_AMBULATORY_CARE_PROVIDER_SITE_OTHER): Payer: Medicare Other | Admitting: Certified Nurse Midwife

## 2016-10-17 VITALS — BP 104/87 | HR 98 | Ht 62.0 in | Wt 144.1 lb

## 2016-10-17 DIAGNOSIS — Z7689 Persons encountering health services in other specified circumstances: Secondary | ICD-10-CM | POA: Diagnosis not present

## 2016-10-17 DIAGNOSIS — Z87440 Personal history of urinary (tract) infections: Secondary | ICD-10-CM

## 2016-10-17 DIAGNOSIS — N898 Other specified noninflammatory disorders of vagina: Secondary | ICD-10-CM

## 2016-10-17 LAB — POCT URINALYSIS DIPSTICK
Glucose, UA: NEGATIVE
Leukocytes, UA: NEGATIVE
Nitrite, UA: NEGATIVE
PH UA: 6 (ref 5.0–8.0)
PROTEIN UA: NEGATIVE
Spec Grav, UA: 1.025 (ref 1.010–1.025)
UROBILINOGEN UA: 0.2 U/dL

## 2016-10-17 NOTE — Progress Notes (Signed)
GYN ENCOUNTER NOTE  Subjective:       Casey Hodges is a 41 y.o. G71P1001 female is here for gynecologic evaluation of the following issues:  1. Vaginal irritation and discharge. She states that she recently had unprotected intercourse. She is widowed x 2 yrs. She had intercourse for the 1st time since her husband passed and is very tearful. She states that she has burning pain at the entrance of the vagina. It is starting to feel better today. She denies odor and itching.    Gynecologic History Patient's last menstrual period was 09/16/2015. Contraception: hysterectomy Last Pap: N/A.  Last mammogram: has not had one yet.  Obstetric History OB History  Gravida Para Term Preterm AB Living  SAB TAB Ectopic Multiple Live Births          1    # Outcome Date GA Lbr Len/2nd Weight Sex Delivery Anes PTL Lv  1 Term 1999   7 lb 9 oz (3.43 kg) F Vag-Spont   LIV      Past Medical History:  Diagnosis Date  . Abnormal vaginal Pap smear    10+ years ago- no colpo repeat was normal  . Anxiety   . AR (allergic rhinitis)   . Bipolar affective (HCC)    pt reported  . Bipolar disorder (HCC)   . Eating disorder    Under control per patient  . Fibromyalgia   . GERD (gastroesophageal reflux disease)   . Kidney stone   . Painful intercourse   . Painful menstrual periods   . Pelvic pain in female   . PTSD (post-traumatic stress disorder)   . Renal disorder   . Spinal stenosis   . Uterine polyp   . VWD (acquired von Willebrand's disease) [facility])     Past Surgical History:  Procedure Laterality Date  . ABDOMINAL HYSTERECTOMY    . HYSTEROSCOPY     removed polyps  . LAPAROSCOPIC VAGINAL HYSTERECTOMY  2015   at [facility]  . LAPAROSCOPY Left 01/10/2015   Procedure: LAPAROSCOPY OPERATIVE with biopsy, left oopherectomy;  Surgeon: Herold Harms, MD;  Location: [facility] ORS;  Service: Gynecology;  Laterality: Left;  . LAPAROSCOPY ABDOMEN DIAGNOSTIC    . TUBAL LIGATION       Current Outpatient Prescriptions on File Prior to Visit  Medication Sig Dispense Refill  . ALPRAZolam (XANAX) 0.5 MG tablet Take 1 tablet (0.5 mg total) by mouth at bedtime. 30 tablet 0  . ibuprofen (ADVIL,MOTRIN) 200 MG tablet Take 200 mg by mouth every 6 (six) hours as needed.    . loperamide (IMODIUM A-D) 2 MG tablet Take 1 tablet (2 mg total) by mouth 4 (four) times daily as needed for diarrhea or loose stools. 12 tablet 0  . oxyCODONE-acetaminophen (PERCOCET) 7.5-325 MG tablet Take 1 tablet by mouth every 8 (eight) hours as needed for severe pain. 45 tablet 0  . pregabalin (LYRICA) 150 MG capsule Take 1 capsule (150 mg total) by mouth 3 (three) times daily as needed. 45 capsule 0  . promethazine (PHENERGAN) 25 MG tablet Take 1 tablet (25 mg total) by mouth every 8 (eight) hours as needed for nausea or vomiting. 30 tablet 0  . traZODone (DESYREL) 100 MG tablet Take 200 mg by mouth at bedtime.    Marland Kitchen zolpidem (AMBIEN) 10 MG tablet Take 1 tablet by mouth at bedtime.     No current facility-administered medications on file prior to visit.  Allergies  Allergen Reactions  . Wellbutrin [Bupropion Hcl] Other (See Comments)    Reaction:  Suicidal   . Augmentin [Amoxicillin-Pot Clavulanate] Diarrhea, Nausea And Vomiting and Other (See Comments)    Has patient had a PCN reaction causing immediate rash, facial/tongue/throat swelling, SOB or lightheadedness with hypotension: No Has patient had a PCN reaction causing severe rash involving mucus membranes or skin necrosis: No Has patient had a PCN reaction that required hospitalization No Has patient had a PCN reaction occurring within the last 10 years: Yes If all of the above answers are "NO", then may proceed with Cephalosporin use.  Marland Kitchen Penicillins Diarrhea, Nausea And Vomiting and Other (See Comments)    Has patient had a PCN reaction causing immediate rash, facial/tongue/throat swelling, SOB or lightheadedness with hypotension: No Has  patient had a PCN reaction causing severe rash involving mucus membranes or skin necrosis: No Has patient had a PCN reaction that required hospitalization No Has patient had a PCN reaction occurring within the last 10 years: Yes If all of the above answers are "NO", then may proceed with Cephalosporin use.  [name]  [Hydrocodone-Ibuprofen] Nausea And Vomiting  . Lasix [Furosemide] Rash  . Nitrofurantoin Monohyd Macro Rash    Social History   Social History  . Marital status: Widowed    Spouse name: N/A  . Number of children: 1  . Years of education: N/A   Occupational History  . Un-employed due to bipolar    Social History Main Topics  . Smoking status: Current Every Day Smoker    Packs/day: 0.50    Types: Cigarettes    Start date: 05/18/1995  . Smokeless tobacco: Never Used  . Alcohol use No     Comment: Socially  . Drug use: No  . Sexual activity: No   Other Topics Concern  . Not on file   Social History Narrative   Regular Exercise -  NO   Daily Caffeine Use:  1 cup coffee in am      1 child, one step child          Family History  Problem Relation Age of Onset  . Hypertension Father   . Sleep apnea Mother   . Breast cancer Paternal Grandmother   . Diabetes Maternal Grandmother   . Diabetes Maternal Aunt   . Diabetes Maternal Uncle     The following portions of the patient's history were reviewed and updated as appropriate: allergies, current medications, past family history, past medical history, past social history, past surgical history and problem list.  Review of Systems Review of Systems - Negative except as noted in HPI Review of Systems - General ROS: negative for - chills, fatigue, fever, hot flashes, malaise or night sweats Hematological and Lymphatic ROS: negative for - bleeding problems or swollen lymph nodes Gastrointestinal ROS: negative for - abdominal pain, blood in stools, change in bowel habits and nausea/vomiting Musculoskeletal  ROS: negative for - joint pain, muscle pain or muscular weakness Genito-Urinary ROS: negative for - change in menstrual cycle, dysmenorrhea, dyspareunia, dysuria,  genital ulcers, hematuria, incontinence, irregular/heavy menses, nocturia or pelvic pain. Positive for vaginal discharge  Objective:   LMP 09/16/2015 Comment: neg preg test CONSTITUTIONAL: Well-developed, well-nourished female in no acute distress.  HENT:  Normocephalic, atraumatic.  NECK: Normal range of motion,  SKIN: Skin is warm and dry. NEUROLGIC: Alert and oriented to person, place, and time.  PSYCHIATRIC: Tearful and upset. Significant hx for PTSD, bipolar disorder CARDIOVASCULAR:Not Examined RESPIRATORY: Not  Examined BREASTS: Not Examined ABDOMEN: not examined PELVIC:  External Genitalia: Normal  Vagina: Normal, thin white discharge noted, no odor. No redness or irritation noted   Cervix: Absent  Uterus: Absent MUSCULOSKELETAL: Normal range of motion. No tenderness.  No cyanosis, clubbing, or edema.   Assessment:   Vaginal irritation    Plan:   NuSWAB: yeast & BV Urine: GC/Chlamydia Discussed blood testing for STD's, declined at this time.  Will call with results  Follow up for annual exam as soon as possible  Doreene Burke, CNM

## 2016-10-17 NOTE — Patient Instructions (Signed)
Cervicitis  Cervicitis is irritation and swelling of the cervix. The cervix is the lower and narrow end of the uterus. It is the part of the uterus that opens up to the vagina.  What are the causes?  This condition may be caused by:  · An STI (sexually transmitted infection), such as gonorrhea, chlamydia, or genital herpes.  · Objects that are put in the vagina, such as tampons or birth control devices. This usually occurs if an object is left in for too long.  · Chemical irritation or allergic reaction. This may be from vaginal douches, latex condoms, or contraceptive creams.  · An injury to the cervix.  · A bacterial infection.  · Radiation therapy.    What increases the risk?  You are more likely to develop this condition if:  · You have unprotected sex.  · You have sex with many partners.  · You have a new sexual partner.  · You start having sex at an early age.  · You have a history of STIs.    What are the signs or symptoms?  Symptoms of this condition include:  · Gray, white, yellow, or bad-smelling vaginal discharge.  · Pain or itchiness around the vagina.  · Pain during sex.  · Pain in the lower abdomen or lower back, especially during sex.  · Urinating often.  · Pain during urination.  · Abnormal vaginal bleeding, such as bleeding between periods, after sex, or after menopause.    In some cases, there are no symptoms.  How is this diagnosed?  This condition may be diagnosed with:  · A pelvic exam. Your health care provider will examine whether the cervix has an unusual discharge or bleeds easily when touched with a swab.  · A wet prep. This is a test in which vaginal discharge is examined under a microscope to check for signs of infection.  · A swab test of the cervix. For this test, sample cells from the cervix are collected on a swab and examined under a microscope to check for signs of infection.  · Urine tests.    How is this treated?  Treatment for cervicitis depends on what is causing the condition.  Treatment may include:  · Antibiotic medicines. These are used to treat certain infections, including STIs like gonorrhea or chlamydia. If you are taking these medicines to treat an STI, your sexual partner may also need to take these medicines.  · Antiviral medicines. These are used to treat herpes simplex virus. Your sexual partner may also need to take these medicines.  · Stopping use of items that cause irritation, such as tampons, latex condoms, douches, or spermicides.    Follow these instructions at home:  · Do not have sex until your health care provider says it is okay.  · Take over-the-counter and prescription medicines only as told by your health care provider.  · If you were prescribed an antibiotic, take it as told by your health care provider. Do not stop taking the antibiotic even if you start to feel better.  · Keep all follow-up visits as told by your health care provider. This is important.  Contact a health care provider if:  · Your symptoms come back or get worse after treatment.  · You have a fever.  · You have fatigue.  · You have pain in your abdomen.  · You experience nausea, vomiting, or diarrhea.  · You have back pain.  Get help right away if:  ·   You have severe abdominal pain that cannot be helped with medicine.  · You cannot urinate.  Summary  · Cervicitis is irritation and swelling of the cervix.  · This condition may be caused by an STI (sexually transmitted infection), an allergic reaction or chemical irritation, radiation therapy, or objects that are put in the vagina, such as tampons or diaphragms.  · Symptoms of this condition can include unusual vaginal discharge, painful urination, irritation or pain around the vagina, bleeding between periods or after sex, and pain during sex.  · You are more likely to develop this condition if you have unprotected sex, have many sexual partners, or have a history of STIs.  · This condition may be treated with antibiotic or antiviral medicines or  by stopping use of items that cause irritation.  This information is not intended to replace advice given to you by your health care provider. Make sure you discuss any questions you have with your health care provider.  Document Released: 06/11/2005 Document Revised: 02/25/2016 Document Reviewed: 02/25/2016  Elsevier Interactive Patient Education © 2017 Elsevier Inc.

## 2016-10-17 NOTE — Addendum Note (Signed)
Addended by: Jackquline Denmark on: 10/17/2016 12:12 PM   Modules accepted: Orders

## 2016-10-19 ENCOUNTER — Telehealth: Payer: Self-pay | Admitting: Certified Nurse Midwife

## 2016-10-19 LAB — NUSWAB VAGINITIS (VG)
Candida albicans, NAA: NEGATIVE
Candida glabrata, NAA: NEGATIVE
Trich vag by NAA: NEGATIVE

## 2016-10-19 LAB — GC/CHLAMYDIA PROBE AMP
CHLAMYDIA, DNA PROBE: NEGATIVE
NEISSERIA GONORRHOEAE BY PCR: NEGATIVE

## 2016-10-19 NOTE — Telephone Encounter (Signed)
Patient called to get lab results. Patient also stated that "she may have took her medication too early and she has more discharge, is uncomfortable and in pain."  Please Advise.

## 2016-10-19 NOTE — Telephone Encounter (Signed)
Please review labs and advise.

## 2016-10-22 ENCOUNTER — Ambulatory Visit: Payer: Self-pay

## 2016-10-22 ENCOUNTER — Ambulatory Visit (INDEPENDENT_AMBULATORY_CARE_PROVIDER_SITE_OTHER): Payer: Medicare Other | Admitting: Certified Nurse Midwife

## 2016-10-22 ENCOUNTER — Encounter: Payer: Self-pay | Admitting: Certified Nurse Midwife

## 2016-10-22 VITALS — BP 117/80 | HR 70 | Ht 62.0 in | Wt 139.9 lb

## 2016-10-22 DIAGNOSIS — B379 Candidiasis, unspecified: Secondary | ICD-10-CM

## 2016-10-22 DIAGNOSIS — L298 Other pruritus: Secondary | ICD-10-CM | POA: Diagnosis not present

## 2016-10-22 DIAGNOSIS — N898 Other specified noninflammatory disorders of vagina: Secondary | ICD-10-CM

## 2016-10-22 DIAGNOSIS — T3695XA Adverse effect of unspecified systemic antibiotic, initial encounter: Secondary | ICD-10-CM | POA: Diagnosis not present

## 2016-10-22 MED ORDER — FLUCONAZOLE 150 MG PO TABS: 150.0000 mg | ORAL_TABLET | Freq: Once | ORAL | 0 refills | Status: AC

## 2016-10-22 MED ORDER — TERCONAZOLE 0.4 % VA CREA: 1.0000 | TOPICAL_CREAM | Freq: Every day | VAGINAL | 0 refills | Status: DC

## 2016-10-22 NOTE — Progress Notes (Signed)
GYN ENCOUNTER NOTE  Subjective:       Casey Hodges is a 41 y.o. G68P1001 female here for gynecologic evaluation of white, clumpy cottage cheese like vaginal discharge as well as external vaginal burning and itching. No relief with OTC monistat.   Denies difficulty breathing or respiratory distress, chest pain, fever, abdominal pain, dysuria, vaginal bleeding, and leg pain or swelling.   Gynecologic History  Patient's last menstrual period was 09/16/2015.   Contraception: status post hysterectomy   Obstetric History  OB History  Gravida Para Term Preterm AB Living  SAB TAB Ectopic Multiple Live Births          1    # Outcome Date GA Lbr Len/2nd Weight Sex Delivery Anes PTL Lv  1 Term 1999   7 lb 9 oz (3.43 kg) F Vag-Spont   LIV      Past Medical History:  Diagnosis Date  . Abnormal vaginal Pap smear    10+ years ago- no colpo repeat was normal  . Anxiety   . AR (allergic rhinitis)   . Bipolar affective (HCC)    pt reported  . Bipolar disorder (HCC)   . Eating disorder    Under control per patient  . Fibromyalgia   . GERD (gastroesophageal reflux disease)   . Kidney stone   . Painful intercourse   . Painful menstrual periods   . Pelvic pain in female   . PTSD (post-traumatic stress disorder)   . Renal disorder   . Spinal stenosis   . Uterine polyp   . VWD (acquired von Willebrand's disease) [facility])     Past Surgical History:  Procedure Laterality Date  . ABDOMINAL HYSTERECTOMY    . HYSTEROSCOPY     removed polyps  . LAPAROSCOPIC VAGINAL HYSTERECTOMY  2015   at [facility]  . LAPAROSCOPY Left 01/10/2015   Procedure: LAPAROSCOPY OPERATIVE with biopsy, left oopherectomy;  Surgeon: Herold Harms, MD;  Location: [facility] ORS;  Service: Gynecology;  Laterality: Left;  . LAPAROSCOPY ABDOMEN DIAGNOSTIC    . TUBAL LIGATION      Current Outpatient Prescriptions on File Prior to Visit  Medication Sig Dispense Refill  . ALPRAZolam (XANAX) 0.5 MG tablet  Take 1 tablet (0.5 mg total) by mouth at bedtime. 30 tablet 0  . ibuprofen (ADVIL,MOTRIN) 200 MG tablet Take 200 mg by mouth every 6 (six) hours as needed.    . loperamide (IMODIUM A-D) 2 MG tablet Take 1 tablet (2 mg total) by mouth 4 (four) times daily as needed for diarrhea or loose stools. 12 tablet 0  . oxyCODONE-acetaminophen (PERCOCET) 7.5-325 MG tablet Take 1 tablet by mouth every 8 (eight) hours as needed for severe pain. 45 tablet 0  . pregabalin (LYRICA) 150 MG capsule Take 1 capsule (150 mg total) by mouth 3 (three) times daily as needed. 45 capsule 0  . promethazine (PHENERGAN) 25 MG tablet Take 1 tablet (25 mg total) by mouth every 8 (eight) hours as needed for nausea or vomiting. 30 tablet 0  . QUEtiapine (SEROQUEL) 200 MG tablet     . traZODone (DESYREL) 100 MG tablet Take 200 mg by mouth at bedtime.    Marland Kitchen zolpidem (AMBIEN) 10 MG tablet Take 1 tablet by mouth at bedtime.     No current facility-administered medications on file prior to visit.     Allergies  Allergen Reactions  . Wellbutrin [Bupropion Hcl] Other (See Comments)  Reaction:  Suicidal   . Augmentin [Amoxicillin-Pot Clavulanate] Diarrhea, Nausea And Vomiting and Other (See Comments)    Has patient had a PCN reaction causing immediate rash, facial/tongue/throat swelling, SOB or lightheadedness with hypotension: No Has patient had a PCN reaction causing severe rash involving mucus membranes or skin necrosis: No Has patient had a PCN reaction that required hospitalization No Has patient had a PCN reaction occurring within the last 10 years: Yes If all of the above answers are "NO", then may proceed with Cephalosporin use.  Marland Kitchen Penicillins Diarrhea, Nausea And Vomiting and Other (See Comments)    Has patient had a PCN reaction causing immediate rash, facial/tongue/throat swelling, SOB or lightheadedness with hypotension: No Has patient had a PCN reaction causing severe rash involving mucus membranes or skin necrosis:  No Has patient had a PCN reaction that required hospitalization No Has patient had a PCN reaction occurring within the last 10 years: Yes If all of the above answers are "NO", then may proceed with Cephalosporin use.  [name]  [Hydrocodone-Ibuprofen] Nausea And Vomiting  . Lasix [Furosemide] Rash  . Nitrofurantoin Monohyd Macro Rash    Social History   Social History  . Marital status: Widowed    Spouse name: N/A  . Number of children: 1  . Years of education: N/A   Occupational History  . Un-employed due to bipolar    Social History Main Topics  . Smoking status: Current Every Day Smoker    Packs/day: 0.50    Types: Cigarettes    Start date: 05/18/1995  . Smokeless tobacco: Never Used  . Alcohol use No     Comment: Socially  . Drug use: No  . Sexual activity: No   Other Topics Concern  . Not on file   Social History Narrative   Regular Exercise -  NO   Daily Caffeine Use:  1 cup coffee in am      1 child, one step child          Family History  Problem Relation Age of Onset  . Hypertension Father   . Sleep apnea Mother   . Breast cancer Paternal Grandmother   . Diabetes Maternal Grandmother   . Diabetes Maternal Aunt   . Diabetes Maternal Uncle     The following portions of the patient's history were reviewed and updated as appropriate: allergies, current medications, past family history, past medical history, past social history, past surgical history and problem list.  Review of Systems  Review of Systems - Negative except as noted above.  History obtained from the patient.  Objective:   BP 117/80 (BP Location: Left Arm, Patient Position: Sitting, Cuff Size: Normal)   Pulse 70   Ht  (1.575 m)   Wt 139 lb 14.4 oz (63.5 kg)   LMP 09/16/2015 Comment: neg preg test  BMI 25.59 kg/m    CONSTITUTIONAL: Well-developed, well-nourished female in no acute distress.   PELVIC:  External Genitalia: Erythema present  Vagina: Normal, thick white  discharge present  Assessment:   1. Antibiotic-induced yeast infection  2. Vaginal itching  Plan:   NuSwab-Six (6) yeast panel collected, will contact pt with results.   Rx Diflucan and Terazole, see orders.   Discussed vaginal health techniques and home treatment measures.   Reviewed red flag symptoms and when to call.   RTC if symptoms worsen or fail to improve.   RTC as needed.    Gunnar Bulla, CNM

## 2016-10-22 NOTE — Patient Instructions (Addendum)
Thank you for enrolling in MyChart. Please follow the instructions below to securely access your online medical record. MyChart allows you to send messages to your doctor, view your test results, renew your prescriptions, schedule appointments, and more.  How Do I Sign Up? 1. In your Internet browser, go to [email or web address] 2. Click on the New  User? link in the Sign In box.  3. Enter your MyChart Access Code exactly as it appears below. You will not need to use this code after you have completed the sign-up process. If you do not sign up before the expiration date, you must request a new code. MyChart Access Code: [email or web address] Expires: 12/16/2016  9:44 AM  4. Enter the last four digits of your Social Security Number (xxxx) and Date of Birth (mm/dd/yyyy) as indicated and click Next. You will be taken to the next sign-up page. 5. Create a MyChart ID. This will be your MyChart login ID and cannot be changed, so think of one that is secure and easy to remember. 6. Create a MyChart password. You can change your password at any time. 7. Enter your Password Reset Question and Answer and click Next. This can be used at a later time if you forget your password.  8. Select your communication preference, and if applicable enter your e-mail address. You will receive e-mail notification when new information is available in MyChart by choosing to receive e-mail notifications and filling in your e-mail. 9. Click Sign In. You can now view your medical record.   Additional Information If you have questions, you can email [email or web address] or call [phone] to talk to our MyChart staff. Remember, MyChart is NOT to be used for urgent needs. For medical emergencies, dial 911.   Vaginal Yeast infection, Adult Vaginal yeast infection is a condition that causes soreness, swelling, and redness (inflammation) of the vagina. It also causes vaginal discharge. This is a common  condition. Some women get this infection frequently. What are the causes? This condition is caused by a change in the normal balance of the yeast (candida) and bacteria that live in the vagina. This change causes an overgrowth of yeast, which causes the inflammation. What increases the risk? This condition is more likely to develop in:  Women who take antibiotic medicines.  Women who have diabetes.  Women who take birth control pills.  Women who are pregnant.  Women who douche often.  Women who have a weak defense (immune) system.  Women who have been taking steroid medicines for a long time.  Women who frequently wear tight clothing. What are the signs or symptoms? Symptoms of this condition include:  White, thick vaginal discharge.  Swelling, itching, redness, and irritation of the vagina. The lips of the vagina (vulva) may be affected as well.  Pain or a burning feeling while urinating.  Pain during sex. How is this diagnosed? This condition is diagnosed with a medical history and physical exam. This will include a pelvic exam. Your health care provider will examine a sample of your vaginal discharge under a microscope. Your health care provider may send this sample for testing to confirm the diagnosis. How is this treated? This condition is treated with medicine. Medicines may be over-the-counter or prescription. You may be told to use one or more of the following:  Medicine that is taken orally.  Medicine that is applied as a cream.  Medicine that is inserted directly into the vagina (suppository). Follow these instructions at  home:  Take or apply over-the-counter and prescription medicines only as told by your health care provider.  Do not have sex until your health care provider has approved. Tell your sex partner that you have a yeast infection. That person should go to his or her health care provider if he or she develops symptoms.  Do not wear tight clothes,  such as pantyhose or tight pants.  Avoid using tampons until your health care provider approves.  Eat more yogurt. This may help to keep your yeast infection from returning.  Try taking a sitz bath to help with discomfort. This is a warm water bath that is taken while you are sitting down. The water should only come up to your hips and should cover your buttocks. Do this 3-4 times per day or as told by your health care provider.  Do not douche.  Wear breathable, cotton underwear.  If you have diabetes, keep your blood sugar levels under control. Contact a health care provider if:  You have a fever.  Your symptoms go away and then return.  Your symptoms do not get better with treatment.  Your symptoms get worse.  You have new symptoms.  You develop blisters in or around your vagina.  You have blood coming from your vagina and it is not your menstrual period.  You develop pain in your abdomen. This information is not intended to replace advice given to you by your health care provider. Make sure you discuss any questions you have with your health care provider. Document Released: 03/21/2005 Document Revised: 11/23/2015 Document Reviewed: 12/13/2014 Elsevier Interactive Patient Education  2017 [employer].  How to Take a [employer] A sitz bath is a warm water bath that is taken while you are sitting down. The water should only come up to your hips and should cover your buttocks. Your health care provider may recommend a sitz bath to help you:  Clean the lower part of your body, including your genital area.  With itching.  With pain.  With sore muscles or muscles that tighten or spasm. How to take a sitz bath Take 3-4 sitz baths per day or as told by your health care provider. 1. Partially fill a bathtub with warm water. You will only need the water to be deep enough to cover your hips and buttocks when you are sitting in it. 2. If your health care provider told you to put  medicine in the water, follow the directions exactly. 3. Sit in the water and open the tub drain a little. 4. Turn on the warm water again to keep the tub at the correct level. Keep the water running constantly. 5. Soak in the water for 15-20 minutes or as told by your health care provider. 6. After the sitz bath, pat the affected area dry first. Do not rub it. 7. Be careful when you stand up after the sitz bath because you may feel dizzy. Contact a health care provider if:  Your symptoms get worse. Do not continue with sitz baths if your symptoms get worse.  You have new symptoms. Do not continue with sitz baths until you talk with your health care provider. This information is not intended to replace advice given to you by your health care provider. Make sure you discuss any questions you have with your health care provider. Document Released: 03/03/2004 Document Revised: 11/09/2015 Document Reviewed: 06/09/2014 Elsevier Interactive Patient Education  2017 [employer].

## 2016-10-24 DIAGNOSIS — R3129 Other microscopic hematuria: Secondary | ICD-10-CM | POA: Diagnosis not present

## 2016-10-24 DIAGNOSIS — N3020 Other chronic cystitis without hematuria: Secondary | ICD-10-CM | POA: Diagnosis not present

## 2016-10-24 DIAGNOSIS — N200 Calculus of kidney: Secondary | ICD-10-CM | POA: Diagnosis not present

## 2016-10-24 DIAGNOSIS — N23 Unspecified renal colic: Secondary | ICD-10-CM | POA: Diagnosis not present

## 2016-10-26 ENCOUNTER — Telehealth: Payer: Self-pay | Admitting: Certified Nurse Midwife

## 2016-10-26 NOTE — Telephone Encounter (Signed)
Patient called requesting lab results. She also states that she still feels as if she has a yeast infection.

## 2016-10-26 NOTE — Telephone Encounter (Signed)
lmtrc

## 2016-10-26 NOTE — Telephone Encounter (Signed)
Pt aware yeast panel is not back yet. Pt did take both diflucan. She is still very irritated and burning. Thick white d/c. No odor. Pt did not get terazol cream. Advised her to get terazol. Use inside and out. Will contact her when yeast panel is back.

## 2016-10-29 LAB — CANDIDA 6 SPECIES PROFILE, NAA
CANDIDA ALBICANS, NAA: NEGATIVE
Candida glabrata, NAA: NEGATIVE
Candida krusei, NAA: NEGATIVE
Candida lusitaniae, NAA: NEGATIVE
Candida parapsilosis, NAA: NEGATIVE
Candida tropicalis, NAA: NEGATIVE

## 2016-10-29 MED ORDER — NYSTATIN-TRIAMCINOLONE 100000-0.1 UNIT/GM-% EX CREA: 1.0000 "application " | TOPICAL_CREAM | Freq: Two times a day (BID) | CUTANEOUS | 1 refills | Status: DC

## 2016-10-29 NOTE — Telephone Encounter (Signed)
Patient Casey Hodges still wanting to know yeast panel results, and to advise that condition has not changed and is very uncomfortable. Patient did advise she started using (Terazol cream) externally and was supposed to apply it internally instead. Please advise.

## 2016-10-29 NOTE — Telephone Encounter (Signed)
Pt aware yeast panel is all neg. She is no better. Burning all on the outside. Very uncomfortable. Advised to stop terazol. Per JM[facility] erx nystatin triamcinolone bid x 7. Advised- Cotton underwear. Dove sensitive soap. Free detergent and no dryer sheets.  Pt will call me on Thursday with an update.

## 2016-10-31 DIAGNOSIS — F319 Bipolar disorder, unspecified: Secondary | ICD-10-CM | POA: Diagnosis not present

## 2016-10-31 DIAGNOSIS — F3132 Bipolar disorder, current episode depressed, moderate: Secondary | ICD-10-CM | POA: Diagnosis not present

## 2016-10-31 DIAGNOSIS — F4310 Post-traumatic stress disorder, unspecified: Secondary | ICD-10-CM | POA: Diagnosis not present

## 2016-11-01 ENCOUNTER — Ambulatory Visit (INDEPENDENT_AMBULATORY_CARE_PROVIDER_SITE_OTHER): Payer: Medicare Other | Admitting: Certified Nurse Midwife

## 2016-11-01 ENCOUNTER — Encounter: Payer: Self-pay | Admitting: Certified Nurse Midwife

## 2016-11-01 VITALS — BP 101/76 | HR 85 | Wt 136.5 lb

## 2016-11-01 DIAGNOSIS — Z202 Contact with and (suspected) exposure to infections with a predominantly sexual mode of transmission: Secondary | ICD-10-CM | POA: Diagnosis not present

## 2016-11-01 DIAGNOSIS — N9489 Other specified conditions associated with female genital organs and menstrual cycle: Secondary | ICD-10-CM

## 2016-11-01 MED ORDER — CLOBETASOL PROPIONATE 0.05 % EX OINT: TOPICAL_OINTMENT | CUTANEOUS | 5 refills | Status: DC

## 2016-11-01 NOTE — Progress Notes (Signed)
GYN ENCOUNTER NOTE  Subjective:       Casey Hodges is a 41 y.o. G61P1001 female here for gynecologic evaluation of persistent vulvar burning.   Pt was previously seen 10/17/2016 and 10/22/2016 for similar symptoms. She reports no relief with prescribed treatment measures.   Last month, Casey Hodges resumed sexual activity for the first time since the death of her husband. She questions possible exposure to a STI. She desires testing again even though she was screened negative on 10/17/2016.  Denies difficulty breathing or respiratory distress, chest pain, abdominal pain, dysuria, vaginal bleeding, and leg pain or swelling.   Gynecologic History  Patient's last menstrual period was 09/16/2015.   Contraception: status post hysterectomy   Last Pap: N/A.  Obstetric History  OB History  Gravida Para Term Preterm AB Living  1 1 1     1   SAB TAB Ectopic Multiple Live Births          1    # Outcome Date GA Lbr Len/2nd Weight Sex Delivery Anes PTL Lv  1 Term 1999   7 lb 9 oz (3.43 kg) F Vag-Spont   LIV      Past Medical History:  Diagnosis Date  . Abnormal vaginal Pap smear    10+ years ago- no colpo repeat was normal  . Anxiety   . AR (allergic rhinitis)   . Bipolar affective (HCC)    pt reported  . Bipolar disorder (HCC)   . Eating disorder    Under control per patient  . Fibromyalgia   . GERD (gastroesophageal reflux disease)   . Kidney stone   . Painful intercourse   . Painful menstrual periods   . Pelvic pain in female   . PTSD (post-traumatic stress disorder)   . Renal disorder   . Spinal stenosis   . Uterine polyp   . VWD (acquired von Willebrand's disease) [facility])     Past Surgical History:  Procedure Laterality Date  . ABDOMINAL HYSTERECTOMY    . HYSTEROSCOPY     removed polyps  . LAPAROSCOPIC VAGINAL HYSTERECTOMY  2015   at [facility]  . LAPAROSCOPY Left 01/10/2015   Procedure: LAPAROSCOPY OPERATIVE with biopsy, left oopherectomy;  Surgeon: Herold Harms, MD;  Location: [facility] ORS;  Service: Gynecology;  Laterality: Left;  . LAPAROSCOPY ABDOMEN DIAGNOSTIC    . TUBAL LIGATION      Current Outpatient Prescriptions on File Prior to Visit  Medication Sig Dispense Refill  . ALPRAZolam (XANAX) 0.5 MG tablet Take 1 tablet (0.5 mg total) by mouth at bedtime. 30 tablet 0  . ibuprofen (ADVIL,MOTRIN) 200 MG tablet Take 200 mg by mouth every 6 (six) hours as needed.    . loperamide (IMODIUM A-D) 2 MG tablet Take 1 tablet (2 mg total) by mouth 4 (four) times daily as needed for diarrhea or loose stools. 12 tablet 0  . nystatin-triamcinolone (MYCOLOG II) cream Apply 1 application topically 2 (two) times daily. 30 g 1  . oxyCODONE-acetaminophen (PERCOCET) 7.5-325 MG tablet Take 1 tablet by mouth every 8 (eight) hours as needed for severe pain. 45 tablet 0  . pregabalin (LYRICA) 150 MG capsule Take 1 capsule (150 mg total) by mouth 3 (three) times daily as needed. 45 capsule 0  . promethazine (PHENERGAN) 25 MG tablet Take 1 tablet (25 mg total) by mouth every 8 (eight) hours as needed for nausea or vomiting. 30 tablet 0  . QUEtiapine (SEROQUEL) 200 MG tablet     . terconazole (  TERAZOL 7) 0.4 % vaginal cream Place 1 applicator vaginally at bedtime. 45 g 0  . traZODone (DESYREL) 100 MG tablet Take 200 mg by mouth at bedtime.    Marland Kitchen zolpidem (AMBIEN) 10 MG tablet Take 1 tablet by mouth at bedtime.     No current facility-administered medications on file prior to visit.     Allergies  Allergen Reactions  . Wellbutrin [Bupropion Hcl] Other (See Comments)    Reaction:  Suicidal   . Augmentin [Amoxicillin-Pot Clavulanate] Diarrhea, Nausea And Vomiting and Other (See Comments)    Has patient had a PCN reaction causing immediate rash, facial/tongue/throat swelling, SOB or lightheadedness with hypotension: No Has patient had a PCN reaction causing severe rash involving mucus membranes or skin necrosis: No Has patient had a PCN reaction that required  hospitalization No Has patient had a PCN reaction occurring within the last 10 years: Yes If all of the above answers are "NO", then may proceed with Cephalosporin use.  Marland Kitchen Penicillins Diarrhea, Nausea And Vomiting and Other (See Comments)    Has patient had a PCN reaction causing immediate rash, facial/tongue/throat swelling, SOB or lightheadedness with hypotension: No Has patient had a PCN reaction causing severe rash involving mucus membranes or skin necrosis: No Has patient had a PCN reaction that required hospitalization No Has patient had a PCN reaction occurring within the last 10 years: Yes If all of the above answers are "NO", then may proceed with Cephalosporin use.  [name]  [Hydrocodone-Ibuprofen] Nausea And Vomiting  . Lasix [Furosemide] Rash  . Nitrofurantoin Monohyd Macro Rash    Social History   Social History  . Marital status: Widowed    Spouse name: N/A  . Number of children: 1  . Years of education: N/A   Occupational History  . Un-employed due to bipolar    Social History Main Topics  . Smoking status: Current Every Day Smoker    Packs/day: 0.50    Types: Cigarettes    Start date: 05/18/1995  . Smokeless tobacco: Never Used  . Alcohol use No     Comment: Socially  . Drug use: No  . Sexual activity: No   Other Topics Concern  . Not on file   Social History Narrative   Regular Exercise -  NO   Daily Caffeine Use:  1 cup coffee in am      1 child, one step child          Family History  Problem Relation Age of Onset  . Hypertension Father   . Sleep apnea Mother   . Breast cancer Paternal Grandmother   . Diabetes Maternal Grandmother   . Diabetes Maternal Aunt   . Diabetes Maternal Uncle     The following portions of the patient's history were reviewed and updated as appropriate: allergies, current medications, past family history, past medical history, past social history, past surgical history and problem list.  Review of  Systems  Review of Systems - Negative except as noted above.  History obtained from the patient.   Objective:   BP 101/76   Pulse 85   Wt 136 lb 8 oz (61.9 kg)   LMP 09/16/2015 Comment: neg preg test  BMI 24.97 kg/m   CONSTITUTIONAL: Well-developed, well-nourished female in no acute distress.   PELVIC:  External Genitalia: Erythema present  Vagina: Normal   Assessment:   1. Vulvar burning - Progesterone - Estrogens, Total  2. Possible exposure to STD - HSV 1 AND 2 IGM  ABS, INDIRECT - HSV(herpes simplex vrs) 1+2 ab-IgG - GC/Chlamydia Probe Amp   Plan:   Suspect early stages of lichen sclerosus or estrogren/progesterone deficiency.   Rx Clobetasol, see orders.   Labs: estrogen level, progesterone level, STI testing, see orders. Will contact pt with results. Encouraged to sign up for MyChart  Reviewed red flag symptoms and when to call.   RTC x 4 weeks for follow up visit/vulvar biopsy or sooner if needed.    Gunnar BullaJenkins Michelle Bona Hubbard, CNM  A total of 25 minutes were spent face-to-face with the patient during this encounter and over half of that time involved counseling and coordination of care.

## 2016-11-01 NOTE — Patient Instructions (Addendum)
Lichen Sclerosus Lichen sclerosus is a skin problem. It can happen on any part of the body, but it commonly involves the anal or genital areas. It can cause itching and discomfort in these areas. Treatment can help to control symptoms. When the genital area is affected, getting treatment is important because the condition can cause scarring that may lead to other problems. What are the causes? The cause of this condition is not known. It could be the result of an overactive immune system or a lack of certain hormones. Lichen sclerosus is not an infection or a fungus. It is not passed from one person to another (not contagious). What increases the risk? This condition is more likely to develop in women, usually after menopause. What are the signs or symptoms? Symptoms of this condition include:  Thin, wrinkled, white areas on the skin.  Thickened white areas on the skin.  Red and swollen patches (lesions) on the skin.  Tears or cracks in the skin.  Bruising.  Blood blisters.  Severe itching. You may also have pain, itching, or burning with urination. Constipation is also common in people with lichen sclerosus. How is this diagnosed? This condition may be diagnosed with a physical exam. In some cases, a tissue sample (biopsy sample) may be removed to be looked at under a microscope. How is this treated? This condition is usually treated with medicated creams or ointments (topical steroids) that are applied over the affected areas. Follow these instructions at home:  Take over-the-counter and prescription medicines only as told by your health care provider.  Use creams or ointments as told by your health care provider.  Do not scratch the affected areas of skin.  Women should keep the vaginal area as clean and dry as possible.  Keep all follow-up visits as told by your health care provider. This is important. Contact a health care provider if:  You have increasing redness,  swelling, or pain in the affected area.  You have fluid, blood, or pus coming from the affected area.  You have new lesions on your skin.  You have a fever.  You have pain during sex. This information is not intended to replace advice given to you by your health care provider. Make sure you discuss any questions you have with your health care provider. Document Released: 11/01/2010 Document Revised: 11/17/2015 Document Reviewed: 09/06/2014 Elsevier Interactive Patient Education  2017 Elsevier Inc[employer]   Clobetasol Propionate Topical Ointment What is this medicine? CLOBETASOL (kloe BAY ta sol) is a corticosteroid. It is used on the skin to treat itching, redness, and swelling caused by some skin conditions. This medicine may be used for other purposes; ask your health care provider or pharmacist if you have questions. COMMON BRAND NAME(S): Cormax, Embeline, Temovate, Temovate E What should I tell my health care provider before I take this medicine? They need to know if you have any of these conditions: -any type of active infection including measles, tuberculosis, herpes, or chickenpox -circulation problems or vascular disease -large areas of burned or damaged skin  -rosacea -skin wasting or thinning -an unusual or allergic reaction to clobetasol, corticosteroids, other medicines, foods, dyes, or preservatives -pregnant or trying to get pregnant -breast-feeding How should I use this medicine? This medicine is for external use only. Do not take by mouth. Follow the directions on the prescription label. Wash your hands before and after use. Apply a thin film of medicine to the affected area. Do not cover with a bandage or dressing  unless your doctor or health care professional tells you to. Do not get this medicine in your eyes. If you do, rinse out with plenty of cool tap water. It is important not to use more medicine than prescribed. Do not use your medicine more often than directed. To do  so may increase the chance of side effects. Talk to your pediatrician regarding the use of this medicine in children. Special care may be needed. Elderly patients are more likely to have damaged skin through aging, and this may increase side effects. This medicine should only be used for brief periods and infrequently in older patients. Overdosage: If you think you have taken too much of this medicine contact a poison control center or emergency room at once. NOTE: This medicine is only for you. Do not share this medicine with others. What if I miss a dose? If you miss a dose, use it as soon as you can. If it is almost time for your next dose, use only that dose. Do not use double or extra doses without advice. What may interact with this medicine? Interactions are not expected. Do not use cosmetics or other skin care products on the treated area. This list may not describe all possible interactions. Give your health care provider a list of all the medicines, herbs, non-prescription drugs, or dietary supplements you use. Also tell them if you smoke, drink alcohol, or use illegal drugs. Some items may interact with your medicine. What should I watch for while using this medicine? Tell your doctor or health care professional if your symptoms do not get better within 2 weeks, or if you develop skin irritation from the medicine. Tell your doctor or health care professional if you are exposed to anyone with measles or chickenpox, or if you develop sores or blisters that do not heal properly. What side effects may I notice from receiving this medicine? Side effects that you should report to your doctor or health care professional as soon as possible: -allergic reactions like skin rash, itching or hives, swelling of the face, lips, or tongue -changes in vision -lack of healing of the skin condition -painful, red, pus filled blisters on the skin or in hair follicles -thinning of the skin with easy  bruising Side effects that usually do not require medical attention (report to your doctor or health care professional if they continue or are bothersome): -burning, irritation of the skin -redness or scaling of the skin This list may not describe all possible side effects. Call your doctor for medical advice about side effects. You may report side effects to FDA at 1-800-FDA-1088. Where should I keep my medicine? Keep out of the reach of children. Store at room temperature between 15 and 30 degrees C (59 and 86 degrees F). Keep away from heat and direct light. Do not freeze. Throw away any unused medicine after the expiration date. NOTE: This sheet is a summary. It may not cover all possible information. If you have questions about this medicine, talk to your doctor, pharmacist, or health care provider.  2018 Elsevier/Gold Standard (2007-09-24 17:22:39)

## 2016-11-02 LAB — GC/CHLAMYDIA PROBE AMP
Chlamydia trachomatis, NAA: NEGATIVE
NEISSERIA GONORRHOEAE BY PCR: NEGATIVE

## 2016-11-03 LAB — HSV(HERPES SIMPLEX VRS) I + II AB-IGG
HSV 1 Glycoprotein G Ab, IgG: 12.6 index — ABNORMAL HIGH (ref 0.00–0.90)
HSV 2 Glycoprotein G Ab, IgG: 5.9 index — ABNORMAL HIGH (ref 0.00–0.90)

## 2016-11-03 LAB — HSV 1 AND 2 IGM ABS, INDIRECT
HSV 1 IgM: <1:10 {titer}
HSV 2 IgM: <1:10 {titer}

## 2016-11-03 LAB — PROGESTERONE: Progesterone: 0.1 ng/mL

## 2016-11-03 LAB — ESTROGENS, TOTAL: Estrogen: 161 pg/mL

## 2016-11-09 ENCOUNTER — Ambulatory Visit (INDEPENDENT_AMBULATORY_CARE_PROVIDER_SITE_OTHER): Payer: Medicare Other | Admitting: Certified Nurse Midwife

## 2016-11-09 VITALS — BP 100/72 | HR 94 | Ht 62.0 in | Wt 141.2 lb

## 2016-11-09 DIAGNOSIS — F633 Trichotillomania: Secondary | ICD-10-CM | POA: Diagnosis not present

## 2016-11-09 DIAGNOSIS — L659 Nonscarring hair loss, unspecified: Secondary | ICD-10-CM

## 2016-11-09 DIAGNOSIS — Z1231 Encounter for screening mammogram for malignant neoplasm of breast: Secondary | ICD-10-CM | POA: Diagnosis not present

## 2016-11-09 DIAGNOSIS — F4310 Post-traumatic stress disorder, unspecified: Secondary | ICD-10-CM | POA: Diagnosis not present

## 2016-11-09 DIAGNOSIS — Z01419 Encounter for gynecological examination (general) (routine) without abnormal findings: Secondary | ICD-10-CM | POA: Diagnosis not present

## 2016-11-09 DIAGNOSIS — F3181 Bipolar II disorder: Secondary | ICD-10-CM | POA: Diagnosis not present

## 2016-11-09 MED ORDER — VALACYCLOVIR HCL 500 MG PO TABS: 500.0000 mg | ORAL_TABLET | Freq: Two times a day (BID) | ORAL | 4 refills | Status: AC

## 2016-11-09 NOTE — Patient Instructions (Signed)
Preventive Care 18-39 Years, Female Preventive care refers to lifestyle choices and visits with your health care provider that can promote health and wellness. What does preventive care include?  A yearly physical exam. This is also called an annual well check.  Dental exams once or twice a year.  Routine eye exams. Ask your health care provider how often you should have your eyes checked.  Personal lifestyle choices, including:  Daily care of your teeth and gums.  Regular physical activity.  Eating a healthy diet.  Avoiding tobacco and drug use.  Limiting alcohol use.  Practicing safe sex.  Taking vitamin and mineral supplements as recommended by your health care provider. What happens during an annual well check? The services and screenings done by your health care provider during your annual well check will depend on your age, overall health, lifestyle risk factors, and family history of disease. Counseling  Your health care provider may ask you questions about your:  Alcohol use.  Tobacco use.  Drug use.  Emotional well-being.  Home and relationship well-being.  Sexual activity.  Eating habits.  Work and work environment.  Method of birth control.  Menstrual cycle.  Pregnancy history. Screening  You may have the following tests or measurements:  Height, weight, and BMI.  Diabetes screening. This is done by checking your blood sugar (glucose) after you have not eaten for a while (fasting).  Blood pressure.  Lipid and cholesterol levels. These may be checked every 5 years starting at age 20.  Skin check.  Hepatitis C blood test.  Hepatitis B blood test.  Sexually transmitted disease (STD) testing.  BRCA-related cancer screening. This may be done if you have a family history of breast, ovarian, tubal, or peritoneal cancers.  Pelvic exam and Pap test. This may be done every 3 years starting at age 21. Starting at age 30, this may be done every 5  years if you have a Pap test in combination with an HPV test. Discuss your test results, treatment options, and if necessary, the need for more tests with your health care provider. Vaccines  Your health care provider may recommend certain vaccines, such as:  Influenza vaccine. This is recommended every year.  Tetanus, diphtheria, and acellular pertussis (Tdap, Td) vaccine. You may need a Td booster every 10 years.  Varicella vaccine. You may need this if you have not been vaccinated.  HPV vaccine. If you are 26 or younger, you may need three doses over 6 months.  Measles, mumps, and rubella (MMR) vaccine. You may need at least one dose of MMR. You may also need a second dose.  Pneumococcal 13-valent conjugate (PCV13) vaccine. You may need this if you have certain conditions and were not previously vaccinated.  Pneumococcal polysaccharide (PPSV23) vaccine. You may need one or two doses if you smoke cigarettes or if you have certain conditions.  Meningococcal vaccine. One dose is recommended if you are age 19-21 years and a first-year college student living in a residence hall, or if you have one of several medical conditions. You may also need additional booster doses.  Hepatitis A vaccine. You may need this if you have certain conditions or if you travel or work in places where you may be exposed to hepatitis A.  Hepatitis B vaccine. You may need this if you have certain conditions or if you travel or work in places where you may be exposed to hepatitis B.  Haemophilus influenzae type b (Hib) vaccine. You may need this   if you have certain risk factors. Talk to your health care provider about which screenings and vaccines you need and how often you need them. This information is not intended to replace advice given to you by your health care provider. Make sure you discuss any questions you have with your health care provider. Document Released: 08/07/2001 Document Revised: 02/29/2016  Document Reviewed: 04/12/2015 Elsevier Interactive Patient Education  2017 [employer].

## 2016-11-09 NOTE — Progress Notes (Signed)
GYNECOLOGY ANNUAL PREVENTATIVE CARE ENCOUNTER NOTE  Subjective:   Casey Hodges is a 41 y.o. G19P1001 female here for a routine annual gynecologic exam.  Current complaints: hair loss.   Denies abnormal vaginal bleeding, discharge, pelvic pain, problems with intercourse or other gynecologic concerns.    Gynecologic History Patient's last menstrual period was 09/16/2015. Contraception: none and hystorectomy Last mammogram: order today Obstetric History OB History  Gravida Para Term Preterm AB Living  1 1 1     1   SAB TAB Ectopic Multiple Live Births          1    # Outcome Date GA Lbr Len/2nd Weight Sex Delivery Anes PTL Lv  1 Term 1999   7 lb 9 oz (3.43 kg) F Vag-Spont   LIV      Past Medical History:  Diagnosis Date  . Abnormal vaginal Pap smear    10+ years ago- no colpo repeat was normal  . Anxiety   . AR (allergic rhinitis)   . Bipolar affective (HCC)    pt reported  . Bipolar disorder (HCC)   . Eating disorder    Under control per patient  . Fibromyalgia   . GERD (gastroesophageal reflux disease)   . Kidney stone   . Painful intercourse   . Painful menstrual periods   . Pelvic pain in female   . PTSD (post-traumatic stress disorder)   . Renal disorder   . Spinal stenosis   . Uterine polyp   . VWD (acquired von Willebrand's disease) [facility])     Past Surgical History:  Procedure Laterality Date  . ABDOMINAL HYSTERECTOMY    . HYSTEROSCOPY     removed polyps  . LAPAROSCOPIC VAGINAL HYSTERECTOMY  2015   at [facility]  . LAPAROSCOPY Left 01/10/2015   Procedure: LAPAROSCOPY OPERATIVE with biopsy, left oopherectomy;  Surgeon: Herold Harms, MD;  Location: [facility] ORS;  Service: Gynecology;  Laterality: Left;  . LAPAROSCOPY ABDOMEN DIAGNOSTIC    . TUBAL LIGATION      Current Outpatient Prescriptions on File Prior to Visit  Medication Sig Dispense Refill  . ALPRAZolam (XANAX) 0.5 MG tablet Take 1 tablet (0.5 mg total) by mouth at bedtime. 30 tablet 0  .  ALPRAZolam (XANAX) 1 MG tablet 2 (two) times daily.    Marland Kitchen ibuprofen (ADVIL,MOTRIN) 200 MG tablet Take 200 mg by mouth every 6 (six) hours as needed.    . loperamide (IMODIUM A-D) 2 MG tablet Take 1 tablet (2 mg total) by mouth 4 (four) times daily as needed for diarrhea or loose stools. 12 tablet 0  . traZODone (DESYREL) 100 MG tablet Take 100 mg by mouth.    . zolpidem (AMBIEN) 10 MG tablet Take 1 tablet by mouth at bedtime.    . clobetasol ointment (TEMOVATE) 0.05 % Apply to affected area every night for 4 weeks, then every other day for 4 weeks and then twice a week for 4 weeks or until resolution. (Patient not taking: Reported on 11/09/2016) 30 g 5  . pregabalin (LYRICA) 150 MG capsule Take 1 capsule (150 mg total) by mouth 3 (three) times daily as needed. 45 capsule 0  . QUEtiapine (SEROQUEL) 300 MG tablet quetiapine 300 mg tablet     No current facility-administered medications on file prior to visit.     Allergies  Allergen Reactions  . Wellbutrin [Bupropion Hcl] Other (See Comments)    Reaction:  Suicidal   . Augmentin [Amoxicillin-Pot Clavulanate] Diarrhea, Nausea And Vomiting and Other (  See Comments)    Has patient had a PCN reaction causing immediate rash, facial/tongue/throat swelling, SOB or lightheadedness with hypotension: No Has patient had a PCN reaction causing severe rash involving mucus membranes or skin necrosis: No Has patient had a PCN reaction that required hospitalization No Has patient had a PCN reaction occurring within the last 10 years: Yes If all of the above answers are "NO", then may proceed with Cephalosporin use.  Marland Kitchen. Penicillins Diarrhea, Nausea And Vomiting and Other (See Comments)    Has patient had a PCN reaction causing immediate rash, facial/tongue/throat swelling, SOB or lightheadedness with hypotension: No Has patient had a PCN reaction causing severe rash involving mucus membranes or skin necrosis: No Has patient had a PCN reaction that required  hospitalization No Has patient had a PCN reaction occurring within the last 10 years: Yes If all of the above answers are "NO", then may proceed with Cephalosporin use.  [name]. Vicoprofen  [Hydrocodone-Ibuprofen] Nausea And Vomiting  . Lasix [Furosemide] Rash  . Nitrofurantoin Monohyd Macro Rash    Social History   Social History  . Marital status: Widowed    Spouse name: N/A  . Number of children: 1  . Years of education: N/A   Occupational History  . Un-employed due to bipolar    Social History Main Topics  . Smoking status: Current Every Day Smoker    Packs/day: 0.50    Types: Cigarettes    Start date: 05/18/1995  . Smokeless tobacco: Never Used  . Alcohol use No     Comment: Socially  . Drug use: No  . Sexual activity: No   Other Topics Concern  . Not on file   Social History Narrative   Regular Exercise -  NO   Daily Caffeine Use:  1 cup coffee in am      1 child, one step child          Family History  Problem Relation Age of Onset  . Hypertension Father   . Sleep apnea Mother   . Breast cancer Paternal Grandmother   . Diabetes Maternal Grandmother   . Diabetes Maternal Aunt   . Diabetes Maternal Uncle     The following portions of the patient's history were reviewed and updated as appropriate: allergies, current medications, past family history, past medical history, past social history, past surgical history and problem list.  Review of Systems Constitutional: negative Eyes: negative Ears, nose, mouth, throat, and face: negative Respiratory: negative Cardiovascular: negative Gastrointestinal: negative Genitourinary:negative Integument/breast: negative Hematologic/lymphatic: negative Musculoskeletal:negative Neurological: negative Behavioral/Psych: negative, tearful Endocrine: negative Allergic/Immunologic: negative   Objective:  BP 100/72 (BP Location: Left Arm, Patient Position: Sitting, Cuff Size: Normal)   Pulse 94   Ht 5\' 2"  (1.575 m)    Wt 141 lb 3.2 oz (64 kg)   LMP 09/16/2015 Comment: neg preg test  BMI 25.83 kg/m  CONSTITUTIONAL: Well-developed, well-nourished female in no acute distress.  HENT:  Normocephalic, atraumatic, External right and left ear normal. Oropharynx is clear and moist EYES: Conjunctivae and EOM are normal. Pupils are equal, round, and reactive to light. No scleral icterus.  NECK: Normal range of motion, supple, no masses.  Normal thyroid.  SKIN: Skin is warm and dry. No rash noted. Not diaphoretic. No erythema. No pallor. NEUROLOGIC: Alert and oriented to person, place, and time. Normal reflexes, muscle tone coordination. No cranial nerve deficit noted. PSYCHIATRIC: Normal mood and affect. Normal behavior. Normal judgment and thought content. CARDIOVASCULAR: Normal heart rate noted,  regular rhythm RESPIRATORY: Clear to auscultation bilaterally. Effort and breath sounds normal, no problems with respiration noted. BREASTS: Symmetric in size. No masses, skin changes, nipple drainage, or lymphadenopathy. ABDOMEN: Soft, normal bowel sounds, no distention noted.  No tenderness, rebound or guarding.  PELVIC: Normal appearing external genitalia; normal appearing vaginal mucosa. Cervix absent.  No abnormal discharge noted.  uterine absent, no other palpable masses, or adnexal tenderness. MUSCULOSKELETAL: Normal range of motion. No tenderness.  No cyanosis, clubbing, or edema.  2+ distal pulses.   Assessment:  Annual gynecologic examination with pap smear   Plan:  Will follow up results of TSH and manage accordingly. Mammogram scheduled. Routine preventative health maintenance measures emphasized. Please refer to After Visit Summary for other counseling recommendations. Reviewed positive HSV results. ACOG bulletin on HSV given to pt.  Discussed plan of care. Valtrex ordered. Return PRN or in 1 yr for annual exam.  Doreene Burke, CNM

## 2016-11-10 LAB — TSH: TSH: 1.99 u[IU]/mL (ref 0.450–4.500)

## 2016-11-12 ENCOUNTER — Ambulatory Visit: Payer: Medicare Other | Attending: Pain Medicine | Admitting: Pain Medicine

## 2016-11-14 ENCOUNTER — Telehealth: Payer: Self-pay | Admitting: Family Medicine

## 2016-11-14 NOTE — Telephone Encounter (Signed)
errenous °

## 2016-11-20 DIAGNOSIS — F319 Bipolar disorder, unspecified: Secondary | ICD-10-CM | POA: Diagnosis not present

## 2016-11-20 DIAGNOSIS — R51 Headache: Secondary | ICD-10-CM | POA: Diagnosis not present

## 2016-11-20 DIAGNOSIS — R109 Unspecified abdominal pain: Secondary | ICD-10-CM | POA: Diagnosis not present

## 2016-11-20 DIAGNOSIS — R1110 Vomiting, unspecified: Secondary | ICD-10-CM | POA: Diagnosis not present

## 2016-11-20 DIAGNOSIS — R220 Localized swelling, mass and lump, head: Secondary | ICD-10-CM | POA: Diagnosis not present

## 2016-11-20 DIAGNOSIS — F419 Anxiety disorder, unspecified: Secondary | ICD-10-CM | POA: Diagnosis not present

## 2016-11-20 DIAGNOSIS — R112 Nausea with vomiting, unspecified: Secondary | ICD-10-CM | POA: Diagnosis not present

## 2016-11-20 DIAGNOSIS — M797 Fibromyalgia: Secondary | ICD-10-CM | POA: Diagnosis not present

## 2016-11-20 DIAGNOSIS — R197 Diarrhea, unspecified: Secondary | ICD-10-CM | POA: Diagnosis not present

## 2016-11-20 DIAGNOSIS — Z87442 Personal history of urinary calculi: Secondary | ICD-10-CM | POA: Diagnosis not present

## 2016-11-20 DIAGNOSIS — R000 Tachycardia, unspecified: Secondary | ICD-10-CM | POA: Diagnosis not present

## 2016-11-20 DIAGNOSIS — F17210 Nicotine dependence, cigarettes, uncomplicated: Secondary | ICD-10-CM | POA: Diagnosis not present

## 2016-11-28 DIAGNOSIS — F4310 Post-traumatic stress disorder, unspecified: Secondary | ICD-10-CM | POA: Diagnosis not present

## 2016-11-28 DIAGNOSIS — F3132 Bipolar disorder, current episode depressed, moderate: Secondary | ICD-10-CM | POA: Diagnosis not present

## 2016-11-28 DIAGNOSIS — F319 Bipolar disorder, unspecified: Secondary | ICD-10-CM | POA: Diagnosis not present

## 2016-12-05 ENCOUNTER — Encounter: Payer: Self-pay | Admitting: *Deleted

## 2016-12-27 DIAGNOSIS — N390 Urinary tract infection, site not specified: Secondary | ICD-10-CM | POA: Diagnosis not present

## 2016-12-27 DIAGNOSIS — R399 Unspecified symptoms and signs involving the genitourinary system: Secondary | ICD-10-CM | POA: Diagnosis not present

## 2017-01-01 ENCOUNTER — Ambulatory Visit (INDEPENDENT_AMBULATORY_CARE_PROVIDER_SITE_OTHER): Payer: Medicare Other | Admitting: Certified Nurse Midwife

## 2017-01-01 ENCOUNTER — Encounter: Payer: Self-pay | Admitting: Certified Nurse Midwife

## 2017-01-01 VITALS — BP 102/77 | HR 79 | Wt 140.1 lb

## 2017-01-01 DIAGNOSIS — B009 Herpesviral infection, unspecified: Secondary | ICD-10-CM | POA: Diagnosis not present

## 2017-01-01 MED ORDER — VALACYCLOVIR HCL 500 MG PO TABS: 500.0000 mg | ORAL_TABLET | Freq: Every day | ORAL | 5 refills | Status: DC

## 2017-01-01 MED ORDER — FLUCONAZOLE 150 MG PO TABS: 150.0000 mg | ORAL_TABLET | Freq: Once | ORAL | 1 refills | Status: AC

## 2017-01-01 NOTE — Patient Instructions (Signed)
Preventive Care 18-39 Years, Female Preventive care refers to lifestyle choices and visits with your health care provider that can promote health and wellness. What does preventive care include?  A yearly physical exam. This is also called an annual well check.  Dental exams once or twice a year.  Routine eye exams. Ask your health care provider how often you should have your eyes checked.  Personal lifestyle choices, including: ? Daily care of your teeth and gums. ? Regular physical activity. ? Eating a healthy diet. ? Avoiding tobacco and drug use. ? Limiting alcohol use. ? Practicing safe sex. ? Taking vitamin and mineral supplements as recommended by your health care provider. What happens during an annual well check? The services and screenings done by your health care provider during your annual well check will depend on your age, overall health, lifestyle risk factors, and family history of disease. Counseling Your health care provider may ask you questions about your:  Alcohol use.  Tobacco use.  Drug use.  Emotional well-being.  Home and relationship well-being.  Sexual activity.  Eating habits.  Work and work Statistician.  Method of birth control.  Menstrual cycle.  Pregnancy history.  Screening You may have the following tests or measurements:  Height, weight, and BMI.  Diabetes screening. This is done by checking your blood sugar (glucose) after you have not eaten for a while (fasting).  Blood pressure.  Lipid and cholesterol levels. These may be checked every 5 years starting at age 66.  Skin check.  Hepatitis C blood test.  Hepatitis B blood test.  Sexually transmitted disease (STD) testing.  BRCA-related cancer screening. This may be done if you have a family history of breast, ovarian, tubal, or peritoneal cancers.  Pelvic exam and Pap test. This may be done every 3 years starting at age 40. Starting at age 59, this may be done every 5  years if you have a Pap test in combination with an HPV test.  Discuss your test results, treatment options, and if necessary, the need for more tests with your health care provider. Vaccines Your health care provider may recommend certain vaccines, such as:  Influenza vaccine. This is recommended every year.  Tetanus, diphtheria, and acellular pertussis (Tdap, Td) vaccine. You may need a Td booster every 10 years.  Varicella vaccine. You may need this if you have not been vaccinated.  HPV vaccine. If you are 69 or younger, you may need three doses over 6 months.  Measles, mumps, and rubella (MMR) vaccine. You may need at least one dose of MMR. You may also need a second dose.  Pneumococcal 13-valent conjugate (PCV13) vaccine. You may need this if you have certain conditions and were not previously vaccinated.  Pneumococcal polysaccharide (PPSV23) vaccine. You may need one or two doses if you smoke cigarettes or if you have certain conditions.  Meningococcal vaccine. One dose is recommended if you are age 27-21 years and a first-year college student living in a residence hall, or if you have one of several medical conditions. You may also need additional booster doses.  Hepatitis A vaccine. You may need this if you have certain conditions or if you travel or work in places where you may be exposed to hepatitis A.  Hepatitis B vaccine. You may need this if you have certain conditions or if you travel or work in places where you may be exposed to hepatitis B.  Haemophilus influenzae type b (Hib) vaccine. You may need this if  you have certain risk factors.  Talk to your health care provider about which screenings and vaccines you need and how often you need them. This information is not intended to replace advice given to you by your health care provider. Make sure you discuss any questions you have with your health care provider. Document Released: 08/07/2001 Document Revised: 02/29/2016  Document Reviewed: 04/12/2015 Elsevier Interactive Patient Education  2017 [employer].

## 2017-01-01 NOTE — Progress Notes (Signed)
GYN ENCOUNTER NOTE  Subjective:       Casey Hodges is a 41 y.o. G56P1001 female is here for gynecologic evaluation of the following issues:  1.to discuss suppression therapy for herpes. She has had flair ups and is wanting to avoid them in the future. Additionally she was recently treated for a UTI and states that she has a yeast infection.    Gynecologic History Patient's last menstrual period was 09/16/2015. Contraception: hystorectomy Last mammogram: ordered 11/09/16, has not done yet  Obstetric History OB History  Gravida Para Term Preterm AB Living  1 1 1     1   SAB TAB Ectopic Multiple Live Births          1    # Outcome Date GA Lbr Len/2nd Weight Sex Delivery Anes PTL Lv  1 Term 1999   7 lb 9 oz (3.43 kg) F Vag-Spont   LIV      Past Medical History:  Diagnosis Date  . Abnormal vaginal Pap smear    10+ years ago- no colpo repeat was normal  . Anxiety   . AR (allergic rhinitis)   . Bipolar affective (HCC)    pt reported  . Bipolar disorder (HCC)   . Eating disorder    Under control per patient  . Fibromyalgia   . GERD (gastroesophageal reflux disease)   . Kidney stone   . Painful intercourse   . Painful menstrual periods   . Pelvic pain in female   . PTSD (post-traumatic stress disorder)   . Renal disorder   . Spinal stenosis   . Uterine polyp   . VWD (acquired von Willebrand's disease) [facility])     Past Surgical History:  Procedure Laterality Date  . ABDOMINAL HYSTERECTOMY    . HYSTEROSCOPY     removed polyps  . LAPAROSCOPIC VAGINAL HYSTERECTOMY  2015   at [facility]  . LAPAROSCOPY Left 01/10/2015   Procedure: LAPAROSCOPY OPERATIVE with biopsy, left oopherectomy;  Surgeon: Herold Harms, MD;  Location: [facility] ORS;  Service: Gynecology;  Laterality: Left;  . LAPAROSCOPY ABDOMEN DIAGNOSTIC    . TUBAL LIGATION      Current Outpatient Prescriptions on File Prior to Visit  Medication Sig Dispense Refill  . ALPRAZolam (XANAX) 1 MG tablet 2 (two) times  daily.    . clobetasol ointment (TEMOVATE) 0.05 % Apply to affected area every night for 4 weeks, then every other day for 4 weeks and then twice a week for 4 weeks or until resolution. 30 g 5  . ibuprofen (ADVIL,MOTRIN) 200 MG tablet Take 200 mg by mouth every 6 (six) hours as needed.    . traZODone (DESYREL) 100 MG tablet Take 100 mg by mouth at bedtime.     Marland Kitchen zolpidem (AMBIEN) 10 MG tablet Take 1 tablet by mouth at bedtime.    Marland Kitchen loperamide (IMODIUM A-D) 2 MG tablet Take 1 tablet (2 mg total) by mouth 4 (four) times daily as needed for diarrhea or loose stools. (Patient not taking: Reported on 01/01/2017) 12 tablet 0  . pregabalin (LYRICA) 150 MG capsule Take 1 capsule (150 mg total) by mouth 3 (three) times daily as needed. 45 capsule 0  . QUEtiapine (SEROQUEL) 300 MG tablet quetiapine 300 mg tablet     No current facility-administered medications on file prior to visit.     Allergies  Allergen Reactions  . Wellbutrin [Bupropion Hcl] Other (See Comments)    Reaction:  Suicidal   . Augmentin [Amoxicillin-Pot Clavulanate] Diarrhea,  Nausea And Vomiting and Other (See Comments)    Has patient had a PCN reaction causing immediate rash, facial/tongue/throat swelling, SOB or lightheadedness with hypotension: No Has patient had a PCN reaction causing severe rash involving mucus membranes or skin necrosis: No Has patient had a PCN reaction that required hospitalization No Has patient had a PCN reaction occurring within the last 10 years: Yes If all of the above answers are "NO", then may proceed with Cephalosporin use.  Marland Kitchen Penicillins Diarrhea, Nausea And Vomiting and Other (See Comments)    Has patient had a PCN reaction causing immediate rash, facial/tongue/throat swelling, SOB or lightheadedness with hypotension: No Has patient had a PCN reaction causing severe rash involving mucus membranes or skin necrosis: No Has patient had a PCN reaction that required hospitalization No Has patient had a  PCN reaction occurring within the last 10 years: Yes If all of the above answers are "NO", then may proceed with Cephalosporin use.  [name]  [Hydrocodone-Ibuprofen] Nausea And Vomiting  . Lasix [Furosemide] Rash  . Nitrofurantoin Monohyd Macro Rash    Social History   Social History  . Marital status: Widowed    Spouse name: N/A  . Number of children: 1  . Years of education: N/A   Occupational History  . Un-employed due to bipolar    Social History Main Topics  . Smoking status: Current Every Day Smoker    Packs/day: 0.50    Types: Cigarettes    Start date: 05/18/1995  . Smokeless tobacco: Never Used  . Alcohol use No     Comment: Socially  . Drug use: No  . Sexual activity: No   Other Topics Concern  . Not on file   Social History Narrative   Regular Exercise -  NO   Daily Caffeine Use:  1 cup coffee in am      1 child, one step child          Family History  Problem Relation Age of Onset  . Hypertension Father   . Sleep apnea Mother   . Breast cancer Paternal Grandmother   . Diabetes Maternal Grandmother   . Diabetes Maternal Aunt   . Diabetes Maternal Uncle     The following portions of the patient's history were reviewed and updated as appropriate: allergies, current medications, past family history, past medical history, past social history, past surgical history and problem list.  Review of Systems Review of Systems - Negative except as mentioned in HPI Review of Systems - General ROS: negative for - chills, fatigue, fever, hot flashes, malaise or night sweats Hematological and Lymphatic ROS: negative for - bleeding problems or swollen lymph nodes Gastrointestinal ROS: negative for - abdominal pain, blood in stools, change in bowel habits and nausea/vomiting Musculoskeletal ROS: negative for - joint pain, muscle pain or muscular weakness Genito-Urinary ROS: negative for - change in menstrual cycle, dysmenorrhea, dyspareunia, dysuria,  hematuria,  incontinence, irregular/heavy menses, nocturia or pelvic pain. Positive for genital discharge, genital pain/tenderness  Objective:   LMP 09/16/2015 Comment: neg preg test CONSTITUTIONAL: Well-developed, well-nourished female in no acute distress.  HENT:  Normocephalic, atraumatic.  NECK: Normal range of motion, supple, no masses.  Normal thyroid.  SKIN: Skin is warm and dry. No rash noted. Not diaphoretic. No erythema. No pallor. NEUROLGIC: Alert and oriented to person, place, and time.  PSYCHIATRIC: Normal mood and affect. Normal behavior. Normal judgment and thought content. CARDIOVASCULAR:Not Examined RESPIRATORY: Not Examined BREASTS: Not Examined ABDOMEN: Not examined PELVIC:Not  examined MUSCULOSKELETAL: Normal range of motion. No tenderness.  No cyanosis, clubbing, or edema.  Assessment:    Herpes outbreak Yeast infection   Plan:   Valtrex prescription ordered, diflucan ordered for yeast infection. Discussed vaginal hygiene and increased risks of infection while on antibiotic treatment. Recommended boric acid as a natural way to promote healthy Ph balance. She agrees to plan. Reviewed safe sex practices. Follow up PRN.   I attest that more than 50% of this visit was spent reviewing history, signs and symptoms of infections and discussing HSV treatment options and suppressive therapy.   Doreene BurkeAnnie Ezzard Ditmer, CNM

## 2017-01-21 ENCOUNTER — Ambulatory Visit
Admission: RE | Admit: 2017-01-21 | Discharge: 2017-01-21 | Disposition: A | Discharge status: Home / Self Care | Payer: Medicare Other | Source: Ambulatory Visit | Attending: Family Medicine | Admitting: Family Medicine

## 2017-01-21 ENCOUNTER — Ambulatory Visit (INDEPENDENT_AMBULATORY_CARE_PROVIDER_SITE_OTHER): Payer: Medicare Other

## 2017-01-21 ENCOUNTER — Ambulatory Visit (INDEPENDENT_AMBULATORY_CARE_PROVIDER_SITE_OTHER): Payer: Medicare Other | Admitting: Family Medicine

## 2017-01-21 ENCOUNTER — Ambulatory Visit: Payer: Self-pay | Admitting: Family Medicine

## 2017-01-21 ENCOUNTER — Encounter: Payer: Self-pay | Admitting: Family Medicine

## 2017-01-21 VITALS — BP 114/72 | HR 68 | Temp 98.8°F | Ht 62.0 in | Wt 139.0 lb

## 2017-01-21 DIAGNOSIS — R109 Unspecified abdominal pain: Secondary | ICD-10-CM | POA: Diagnosis not present

## 2017-01-21 DIAGNOSIS — N281 Cyst of kidney, acquired: Secondary | ICD-10-CM | POA: Diagnosis not present

## 2017-01-21 DIAGNOSIS — M549 Dorsalgia, unspecified: Secondary | ICD-10-CM | POA: Diagnosis not present

## 2017-01-21 DIAGNOSIS — Z0000 Encounter for general adult medical examination without abnormal findings: Secondary | ICD-10-CM | POA: Diagnosis not present

## 2017-01-21 DIAGNOSIS — N200 Calculus of kidney: Secondary | ICD-10-CM | POA: Insufficient documentation

## 2017-01-21 DIAGNOSIS — N3001 Acute cystitis with hematuria: Secondary | ICD-10-CM

## 2017-01-21 DIAGNOSIS — R3129 Other microscopic hematuria: Secondary | ICD-10-CM | POA: Diagnosis not present

## 2017-01-21 LAB — POCT URINALYSIS DIPSTICK
BILIRUBIN UA: NEGATIVE
Glucose, UA: NEGATIVE
KETONES UA: NEGATIVE
Nitrite, UA: POSITIVE
Spec Grav, UA: 1.015 (ref 1.010–1.025)
Urobilinogen, UA: 1 E.U./dL
pH, UA: 5 (ref 5.0–8.0)

## 2017-01-21 MED ORDER — CIPROFLOXACIN HCL 250 MG PO TABS: 250.0000 mg | ORAL_TABLET | Freq: Two times a day (BID) | ORAL | 0 refills | Status: AC

## 2017-01-21 MED ORDER — SULFAMETHOXAZOLE-TRIMETHOPRIM 800-160 MG PO TABS: 1.0000 | ORAL_TABLET | Freq: Two times a day (BID) | ORAL | 0 refills | Status: DC

## 2017-01-21 MED ORDER — FLUCONAZOLE 150 MG PO TABS: 150.0000 mg | ORAL_TABLET | Freq: Once | ORAL | 0 refills | Status: AC

## 2017-01-21 NOTE — Patient Instructions (Signed)
Casey Hodges , Thank you for taking time to come for your Medicare Wellness Visit. I appreciate your ongoing commitment to your health goals. Please review the following plan we discussed and let me know if I can assist you in the future.   Screening recommendations/referrals: Colonoscopy: N/A Mammogram: pt has referral and plans to set this up this year Bone Density: N/A Recommended yearly ophthalmology/optometry visit for glaucoma screening and checkup Recommended yearly dental visit for hygiene and checkup  Vaccinations: Influenza vaccine: due 02/2017 Pneumococcal vaccine: due at age 42 Tdap vaccine: declined today Shingles vaccine: N/A  Advanced directives: Advance directive discussed with you today. Even though you declined this today please call our office should you change your mind and we can give you the proper paperwork for you to fill out.  Conditions/risks identified: Fall risk prevention; Smoking cessation; Recommend to decrease smoking from 1 pack (20) to 1/2 pack (10) a day.    Next appointment: None, need to schedule 1 year AWV  Preventive Care 40-64 Years, Female Preventive care refers to lifestyle choices and visits with your health care provider that can promote health and wellness. What does preventive care include?  A yearly physical exam. This is also called an annual well check.  Dental exams once or twice a year.  Routine eye exams. Ask your health care provider how often you should have your eyes checked.  Personal lifestyle choices, including:  Daily care of your teeth and gums.  Regular physical activity.  Eating a healthy diet.  Avoiding tobacco and drug use.  Limiting alcohol use.  Practicing safe sex.  Taking low-dose aspirin daily starting at age 38.  Taking vitamin and mineral supplements as recommended by your health care provider. What happens during an annual well check? The services and screenings done by your health care provider  during your annual well check will depend on your age, overall health, lifestyle risk factors, and family history of disease. Counseling  Your health care provider may ask you questions about your:  Alcohol use.  Tobacco use.  Drug use.  Emotional well-being.  Home and relationship well-being.  Sexual activity.  Eating habits.  Work and work Statistician.  Method of birth control.  Menstrual cycle.  Pregnancy history. Screening  You may have the following tests or measurements:  Height, weight, and BMI.  Blood pressure.  Lipid and cholesterol levels. These may be checked every 5 years, or more frequently if you are over 79 years old.  Skin check.  Lung cancer screening. You may have this screening every year starting at age 84 if you have a 30-pack-year history of smoking and currently smoke or have quit within the past 15 years.  Fecal occult blood test (FOBT) of the stool. You may have this test every year starting at age 64.  Flexible sigmoidoscopy or colonoscopy. You may have a sigmoidoscopy every 5 years or a colonoscopy every 10 years starting at age 35.  Hepatitis C blood test.  Hepatitis B blood test.  Sexually transmitted disease (STD) testing.  Diabetes screening. This is done by checking your blood sugar (glucose) after you have not eaten for a while (fasting). You may have this done every 1-3 years.  Mammogram. This may be done every 1-2 years. Talk to your health care provider about when you should start having regular mammograms. This may depend on whether you have a family history of breast cancer.  BRCA-related cancer screening. This may be done if you have a family  history of breast, ovarian, tubal, or peritoneal cancers.  Pelvic exam and Pap test. This may be done every 3 years starting at age 25. Starting at age 71, this may be done every 5 years if you have a Pap test in combination with an HPV test.  Bone density scan. This is done to screen  for osteoporosis. You may have this scan if you are at high risk for osteoporosis. Discuss your test results, treatment options, and if necessary, the need for more tests with your health care provider. Vaccines  Your health care provider may recommend certain vaccines, such as:  Influenza vaccine. This is recommended every year.  Tetanus, diphtheria, and acellular pertussis (Tdap, Td) vaccine. You may need a Td booster every 10 years.  Zoster vaccine. You may need this after age 59.  Pneumococcal 13-valent conjugate (PCV13) vaccine. You may need this if you have certain conditions and were not previously vaccinated.  Pneumococcal polysaccharide (PPSV23) vaccine. You may need one or two doses if you smoke cigarettes or if you have certain conditions. Talk to your health care provider about which screenings and vaccines you need and how often you need them. This information is not intended to replace advice given to you by your health care provider. Make sure you discuss any questions you have with your health care provider. Document Released: 07/08/2015 Document Revised: 02/29/2016 Document Reviewed: 04/12/2015 Elsevier Interactive Patient Education  2017 [place] Prevention in the Home Falls can cause injuries. They can happen to people of all ages. There are many things you can do to make your home safe and to help prevent falls. What can I do on the outside of my home?  Regularly fix the edges of walkways and driveways and fix any cracks.  Remove anything that might make you trip as you walk through a door, such as a raised step or threshold.  Trim any bushes or trees on the path to your home.  Use bright outdoor lighting.  Clear any walking paths of anything that might make someone trip, such as rocks or tools.  Regularly check to see if handrails are loose or broken. Make sure that both sides of any steps have handrails.  Any raised decks and porches should  have guardrails on the edges.  Have any leaves, snow, or ice cleared regularly.  Use sand or salt on walking paths during winter.  Clean up any spills in your garage right away. This includes oil or grease spills. What can I do in the bathroom?  Use night lights.  Install grab bars by the toilet and in the tub and shower. Do not use towel bars as grab bars.  Use non-skid mats or decals in the tub or shower.  If you need to sit down in the shower, use a plastic, non-slip stool.  Keep the floor dry. Clean up any water that spills on the floor as soon as it happens.  Remove soap buildup in the tub or shower regularly.  Attach bath mats securely with double-sided non-slip rug tape.  Do not have throw rugs and other things on the floor that can make you trip. What can I do in the bedroom?  Use night lights.  Make sure that you have a light by your bed that is easy to reach.  Do not use any sheets or blankets that are too big for your bed. They should not hang down onto the floor.  Have a firm chair  that has side arms. You can use this for support while you get dressed.  Do not have throw rugs and other things on the floor that can make you trip. What can I do in the kitchen?  Clean up any spills right away.  Avoid walking on wet floors.  Keep items that you use a lot in easy-to-reach places.  If you need to reach something above you, use a strong step stool that has a grab bar.  Keep electrical cords out of the way.  Do not use floor polish or wax that makes floors slippery. If you must use wax, use non-skid floor wax.  Do not have throw rugs and other things on the floor that can make you trip. What can I do with my stairs?  Do not leave any items on the stairs.  Make sure that there are handrails on both sides of the stairs and use them. Fix handrails that are broken or loose. Make sure that handrails are as long as the stairways.  Check any carpeting to make sure  that it is firmly attached to the stairs. Fix any carpet that is loose or worn.  Avoid having throw rugs at the top or bottom of the stairs. If you do have throw rugs, attach them to the floor with carpet tape.  Make sure that you have a light switch at the top of the stairs and the bottom of the stairs. If you do not have them, ask someone to add them for you. What else can I do to help prevent falls?  Wear shoes that:  Do not have high heels.  Have rubber bottoms.  Are comfortable and fit you well.  Are closed at the toe. Do not wear sandals.  If you use a stepladder:  Make sure that it is fully opened. Do not climb a closed stepladder.  Make sure that both sides of the stepladder are locked into place.  Ask someone to hold it for you, if possible.  Clearly mark and make sure that you can see:  Any grab bars or handrails.  First and last steps.  Where the edge of each step is.  Use tools that help you move around (mobility aids) if they are needed. These include:  Canes.  Walkers.  Scooters.  Crutches.  Turn on the lights when you go into a dark area. Replace any light bulbs as soon as they burn out.  Set up your furniture so you have a clear path. Avoid moving your furniture around.  If any of your floors are uneven, fix them.  If there are any pets around you, be aware of where they are.  Review your medicines with your doctor. Some medicines can make you feel dizzy. This can increase your chance of falling. Ask your doctor what other things that you can do to help prevent falls. This information is not intended to replace advice given to you by your health care provider. Make sure you discuss any questions you have with your health care provider. Document Released: 04/07/2009 Document Revised: 11/17/2015 Document Reviewed: 07/16/2014 Elsevier Interactive Patient Education  2017 [employer].

## 2017-01-21 NOTE — Progress Notes (Addendum)
Name: Casey Hodges   MRN: [id number]    DOB: 1976/04/15   Date:01/21/2017       Progress Note  Subjective  Chief Complaint  Chief Complaint  Patient presents with  . Urinary Tract Infection    HPI  Pt presents with c/o 1 day of concern for possible UTI. She has right flank pain, dysuria, urgency, and frequency and some fatigue; no blood in urine, some nausea.  Has significant history of kidney stones, and is unsure if this is similar; saw Dr. Achilles Dunk this year in May  - does not know when he next follow up is. Took AZO yesterday with only minimal relief. No vomiting, no diarrhea.   Patient Active Problem List   Diagnosis Date Noted  . Orthostasis 08/24/2016  . Antibiotic-induced yeast infection 08/22/2016  . Exercise-induced tachycardia 08/22/2016  . Constipation due to opioid therapy 11/04/2015  . Severe episode of recurrent major depressive disorder, without psychotic features (HCC)   . GERD (gastroesophageal reflux disease) 09/16/2015  . Smoker 07/25/2015  . Snoring 06/09/2015  . Cardiac murmur 05/25/2015  . Drug-induced nausea and vomiting 03/22/2015  . Cyst, bone 02/14/2015  . Pelvic adhesive disease 01/21/2015  . Arthritis 12/19/2014  . Kidney stone on left side 12/13/2014  . Anxiety disorder 12/13/2014  . Postural dizziness 12/13/2014  . Subcutaneous cyst 12/13/2014  . Nephrolithiasis 05/23/2012  . Chronic pelvic pain in female 05/23/2012  . Fibromyalgia 05/23/2012  . Bipolar disorder with depression (HCC) 06/05/2011    Social History  Substance Use Topics  . Smoking status: Current Every Day Smoker    Packs/day: 1.00    Types: Cigarettes    Start date: 05/18/1995  . Smokeless tobacco: Never Used  . Alcohol use 0.0 oz/week     Comment: Socially- seldom     Current Outpatient Prescriptions:  .  ALPRAZolam (XANAX) 1 MG tablet, 2 (two) times daily., Disp: , Rfl:  .  ibuprofen (ADVIL,MOTRIN) 200 MG tablet, Take 200 mg by mouth every 6 (six) hours as needed.,  Disp: , Rfl:  .  traZODone (DESYREL) 100 MG tablet, Take 200 mg by mouth at bedtime. , Disp: , Rfl:  .  valACYclovir (VALTREX) 500 MG tablet, Take 1 tablet (500 mg total) by mouth daily., Disp: 30 tablet, Rfl: 5 .  zolpidem (AMBIEN) 10 MG tablet, Take 1 tablet by mouth at bedtime as needed. , Disp: , Rfl:  .  fluconazole (DIFLUCAN) 150 MG tablet, Take 1 tablet (150 mg total) by mouth once. May take additional dose 72 hours after first dose., Disp: 2 tablet, Rfl: 0 .  sulfamethoxazole-trimethoprim (BACTRIM DS,SEPTRA DS) 800-160 MG tablet, Take 1 tablet by mouth 2 (two) times daily., Disp: 14 tablet, Rfl: 0  Allergies  Allergen Reactions  . Wellbutrin [Bupropion Hcl] Other (See Comments)    Reaction:  Suicidal   . Augmentin [Amoxicillin-Pot Clavulanate] Diarrhea, Nausea And Vomiting and Other (See Comments)    Has patient had a PCN reaction causing immediate rash, facial/tongue/throat swelling, SOB or lightheadedness with hypotension: No Has patient had a PCN reaction causing severe rash involving mucus membranes or skin necrosis: No Has patient had a PCN reaction that required hospitalization No Has patient had a PCN reaction occurring within the last 10 years: No If all of the above answers are "NO", then may proceed with Cephalosporin use.  Marland Kitchen Penicillins Diarrhea, Nausea And Vomiting and Other (See Comments)    Has patient had a PCN reaction causing immediate rash, facial/tongue/throat swelling, SOB or  lightheadedness with hypotension: No Has patient had a PCN reaction causing severe rash involving mucus membranes or skin necrosis: No Has patient had a PCN reaction that required hospitalization No Has patient had a PCN reaction occurring within the last 10 years: No If all of the above answers are "NO", then may proceed with Cephalosporin use.  . Lasix [Furosemide] Rash  . Nitrofurantoin Monohyd Macro Rash    ROS  Constitutional: Negative for fever or weight change.  Respiratory:  Negative for cough and shortness of breath.   Cardiovascular: Negative for chest pain or palpitations.  Gastrointestinal: See HPI Musculoskeletal: Negative for gait problem or joint swelling.  Skin: Negative for rash.  Neurological: Negative for dizziness or headache.  No other specific complaints in a complete review of systems (except as listed in HPI above).  Objective  Vitals:   01/21/17 1556  BP: 114/72  Pulse: 68  Temp: 98.8 F (37.1 C)  TempSrc: Oral  Weight: 139 lb (63 kg)  Height: 5\' 2"  (1.575 m)   Body mass index is 25.42 kg/m.  Nursing Note and Vital Signs reviewed.  Physical Exam  Constitutional: Patient appears well-developed and well-nourished. No distress.  HEENT: head atraumatic, normocephalic Cardiovascular: Normal rate, regular rhythm, S1/S2 present.  No murmur or rub heard. No BLE edema. Pulmonary/Chest: Effort normal and breath sounds clear. No respiratory distress or retractions. Abdominal: Soft; right flank and RLQ mildly tender otherwise no tenderness, bowel sounds present x4 quadrants.  Significant RIGHT sided CVA Tenderness. Psychiatric: Patient has a normal mood and affect. behavior is normal. Judgment and thought content normal.  Recent Results (from the past 2160 hour(s))  Progesterone     Status: None   Collection Time: 11/01/16 11:12 AM  Result Value Ref Range   Progesterone 0.1 ng/mL    Comment:                      Follicular phase       0.1 -   0.9                      Luteal phase           1.8 -  23.9                      Ovulation phase        0.1 -  12.0                      Pregnant                         First trimester    11.0 -  44.3                         Second trimester   25.4 -  83.3                         Third trimester    58.7 - 214.0                      Postmenopausal         0.0 -   0.1   Estrogens, Total     Status: None   Collection Time: 11/01/16 11:12 AM  Result Value Ref Range   Estrogen 161 pg/mL  Comment:                             Prepubertal           <40                             Female Cycle:                               1-10 Days      61 - 394                              11-20 Days     122 - 437                              21-30 Days     156 - 350                              Post-Menopausal      <40                           [facility] Treatment for Ovulation                              Induction:     400 - 800   HSV 1 AND 2 IGM ABS, INDIRECT     Status: None   Collection Time: 11/01/16 11:12 AM  Result Value Ref Range   HSV 1 IgM <1:10 <1:10 titer   HSV 2 IgM <1:10 <1:10 titer    Comment: HSV 1 and HSV 2 share many cross-reacting antigens. Elevated titers to both HSV 1 and HSV 2 may represent crossreactive HSV antibodies rather than exposure to both HSV 1 and HSV 2. Results for this test are for research purposes only by the assay's manufacturer. The performance characteristics of this product have not been established.  Results should not be used as a diagnostic procedure without confirmation of the diagnosis by another medically established diagnostic product or procedure.   HSV(herpes simplex vrs) 1+2 ab-IgG     Status: Abnormal   Collection Time: 11/01/16 11:12 AM  Result Value Ref Range   HSV 1 Glycoprotein G Ab, IgG 12.60 (H) 0.00 - 0.90 index    Comment:                                  Negative        <0.91                                  Equivocal 0.91 - 1.09                                  Positive        >1.09  Note: Negative indicates no antibodies detected to  HSV-1. Equivocal may suggest early infection.  If  clinically appropriate,  retest at later date. Positive  indicates antibodies detected to HSV-1.    HSV 2 Glycoprotein G Ab, IgG 5.90 (H) 0.00 - 0.90 index    Comment:                                  Negative        <0.91                                  Equivocal 0.91 - 1.09                                  Positive        >1.09  Note:  Negative indicates no antibodies detected to  HSV-2. Equivocal may suggest early infection.  If  clinically appropriate, retest at later date. Positive  indicates antibodies detected to HSV-2.   GC/Chlamydia Probe Amp     Status: None   Collection Time: 11/01/16  2:27 PM  Result Value Ref Range   Chlamydia trachomatis, NAA Negative Negative   Neisseria gonorrhoeae by PCR Negative Negative  TSH     Status: None   Collection Time: 11/09/16  3:52 PM  Result Value Ref Range   TSH 1.990 0.450 - 4.500 uIU/mL  POCT urinalysis dipstick     Status: Abnormal   Collection Time: 01/21/17  3:55 PM  Result Value Ref Range   Color, UA gold    Clarity, UA cloudy    Glucose, UA negative    Bilirubin, UA negative    Ketones, UA negative    Spec Grav, UA 1.015 1.010 - 1.025   Blood, UA small    pH, UA 5.0 5.0 - 8.0   Protein, UA trace    Urobilinogen, UA 1.0 0.2 or 1.0 E.U./dL   Nitrite, UA positive    Leukocytes, UA Moderate (2+) (A) Negative     Assessment & Plan  1. Acute cystitis with hematuria - POCT urinalysis dipstick - Urine Culture - Urinalysis, microscopic only - ciprofloxacin (CIPRO) 250 MG tablet; Take 1 tablet (250 mg total) by mouth 2 (two) times daily.  Dispense: 14 tablet; Refill: 0 - CT RENAL STONE STUDY; Future - Ibuprofen PRN for pain relief. Zofran PRN for nausea.   2. CVA tenderness - ciprofloxacin (CIPRO) 250 MG tablet; Take 1 tablet (250 mg total) by mouth 2 (two) times daily.  Dispense: 14 tablet; Refill: 0 - CT RENAL STONE STUDY; Future  3. Acute right flank pain - CT RENAL STONE STUDY; Future - Scheduled immediately today, we will await results to determine if immediate attention in ER is needed vs urgent referral to Dr. Achilles Dunkope is needed.  -Red flags and when to present for emergency care or RTC including fever >101.63F, chest pain, shortness of breath, new/worsening/un-resolving symptoms, vomiting, diarrhea, severe pain, frank blood in urine reviewed with patient  at time of visit. Follow up and care instructions discussed and provided in AVS.  I have reviewed this encounter including the documentation in this note and/or discussed this patient with the Deboraha Sprangprovider,Nina Mondor, FNP, NP-C. I am certifying that I agree with the content of this note as supervising physician.  Alba CoryKrichna Sowles, MD [facility] 01/21/2017, 4:44 PM

## 2017-01-21 NOTE — Patient Instructions (Addendum)

## 2017-01-21 NOTE — Progress Notes (Addendum)
Subjective:   Casey Hodges is a 41 y.o. female who presents for Medicare Annual (Subsequent) preventive examination.  Review of Systems:  N/A  Cardiac Risk Factors include: advanced age (>56men, >50 women);smoking/ tobacco exposure     Objective:     Vitals: BP 114/72 (BP Location: Left Arm)   Pulse 68   Temp 98.8 F (37.1 C) (Oral)   Ht 5\' 2"  (1.575 m)   Wt 139 lb (63 kg)   LMP 09/16/2015 Comment: neg preg test  BMI 25.42 kg/m   Body mass index is 25.42 kg/m.   Tobacco History  Smoking Status  . Current Every Day Smoker  . Packs/day: 1.00  . Types: Cigarettes  . Start date: 05/18/1995  Smokeless Tobacco  . Never Used     Ready to quit: No Counseling given: Not Answered   Past Medical History:  Diagnosis Date  . Abnormal vaginal Pap smear    10+ years ago- no colpo repeat was normal  . Anxiety   . AR (allergic rhinitis)   . Bipolar affective (HCC)    pt reported  . Bipolar disorder (HCC)   . Eating disorder    Under control per patient  . Fibromyalgia   . GERD (gastroesophageal reflux disease)   . Kidney stone   . Painful intercourse   . Painful menstrual periods   . Pelvic pain in female   . PTSD (post-traumatic stress disorder)   . Renal disorder   . Spinal stenosis   . Uterine polyp   . VWD (acquired von Willebrand's disease) [facility])    Past Surgical History:  Procedure Laterality Date  . ABDOMINAL HYSTERECTOMY    . HYSTEROSCOPY     removed polyps  . LAPAROSCOPIC VAGINAL HYSTERECTOMY  2015   at [facility]  . LAPAROSCOPY Left 01/10/2015   Procedure: LAPAROSCOPY OPERATIVE with biopsy, left oopherectomy;  Surgeon: Herold Harms, MD;  Location: [facility] ORS;  Service: Gynecology;  Laterality: Left;  . LAPAROSCOPY ABDOMEN DIAGNOSTIC    . TUBAL LIGATION     Family History  Problem Relation Age of Onset  . Hypertension Father   . Sleep apnea Mother   . Breast cancer Paternal Grandmother   . Diabetes Maternal Grandmother   . Diabetes  Maternal Aunt   . Diabetes Maternal Uncle    History  Sexual Activity  . Sexual activity: No    Outpatient Encounter Prescriptions as of 01/21/2017  Medication Sig  . ALPRAZolam (XANAX) 1 MG tablet 2 (two) times daily.  Marland Kitchen ibuprofen (ADVIL,MOTRIN) 200 MG tablet Take 200 mg by mouth every 6 (six) hours as needed.  . traZODone (DESYREL) 100 MG tablet Take 200 mg by mouth at bedtime.   . valACYclovir (VALTREX) 500 MG tablet Take 1 tablet (500 mg total) by mouth daily.  Marland Kitchen zolpidem (AMBIEN) 10 MG tablet Take 1 tablet by mouth at bedtime as needed.   . [DISCONTINUED] valACYclovir (VALTREX) 500 MG tablet TAKE 1 TABLET (500 MG TOTAL) BY MOUTH 2 ONCE DAILY  . [DISCONTINUED] cefUROXime (CEFTIN) 250 MG tablet Take by mouth.  . [DISCONTINUED] clobetasol ointment (TEMOVATE) 0.05 % Apply to affected area every night for 4 weeks, then every other day for 4 weeks and then twice a week for 4 weeks or until resolution.  . [DISCONTINUED] loperamide (IMODIUM A-D) 2 MG tablet Take 1 tablet (2 mg total) by mouth 4 (four) times daily as needed for diarrhea or loose stools. (Patient not taking: Reported on 01/01/2017)  . [DISCONTINUED]  pregabalin (LYRICA) 150 MG capsule Take 1 capsule (150 mg total) by mouth 3 (three) times daily as needed.  . [DISCONTINUED] QUEtiapine (SEROQUEL) 300 MG tablet quetiapine 300 mg tablet   No facility-administered encounter medications on file as of 01/21/2017.     Activities of Daily Living In your present state of health, do you have any difficulty performing the following activities: 01/21/2017 09/20/2016  Hearing? Y N  Comment no follow up -  Vision? Malvin JohnsY Y  Comment does not sees an optometrist reading glasses  Difficulty concentrating or making decisions? Y N  Walking or climbing stairs? N N  Comment - -  Dressing or bathing? N N  Doing errands, shopping? N N  Preparing Food and eating ? N -  Using the Toilet? N -  In the past six months, have you accidently leaked urine? Y -   Comment occasionally when sneezing or coughing, hx of kidney stones -  Do you have problems with loss of bowel control? N -  Managing your Medications? N -  Managing your Finances? N -  Housekeeping or managing your Housekeeping? N -  Some recent data might be hidden    Patient Care Team: Ellyn HackShah, Syed Asad A, MD as PCP - General (Family Medicine) Doreene Burkehompson, Annie, CNM as Midwife (Certified Nurse Midwife)    Assessment:     Exercise Activities and Dietary recommendations Current Exercise Habits: Structured exercise class, Type of exercise: walking;strength training/weights;stretching, Time (Minutes): > 60 (2 hours, walks 1 hour around neighborhood and 1 hour at gym with weights), Frequency (Times/Week): 5, Weekly Exercise (Minutes/Week): 0, Intensity: Moderate  Goals    . smoking cessation          Recommend to decrease smoking from 1 pack (20) to 1/2 pack (10) a day.        Fall Risk Fall Risk  01/21/2017 09/20/2016 07/23/2016 03/22/2016 03/06/2016  Falls in the past year? Yes No No No No  Number falls in past yr: 2 or more - - - -  Injury with Fall? No - - - -  Risk Factor Category  High Fall Risk - - - -  Follow up Falls prevention discussed - - - -   Depression Screen PHQ 2/9 Scores 01/21/2017 09/20/2016 07/23/2016 03/22/2016  PHQ - 2 Score 6 0 0 0  PHQ- 9 Score 24 - - -     Cognitive Function     6CIT Screen 01/21/2017  What Year? 0 points  What month? 0 points  What time? 0 points  Count back from 20 0 points  Months in reverse 0 points  Repeat phrase 4 points  Total Score 4    Immunization History  Administered Date(s) Administered  . Influenza,inj,Quad PF,36+ Mos 03/21/2015, 04/23/2016  . Pneumococcal Polysaccharide-23 09/22/2015   Screening Tests Health Maintenance  Topic Date Due  . TETANUS/TDAP  06/26/1995  . INFLUENZA VACCINE  01/23/2017  . PAP SMEAR  11/10/2019  . HIV Screening  Completed      Plan:  I have personally reviewed and addressed the  Medicare Annual Wellness questionnaire and have noted the following in the patient's chart:  A. Medical and social history B. Use of alcohol, tobacco or illicit drugs  C. Current medications and supplements D. Functional ability and status E.  Nutritional status F.  Physical activity G. Advance directives H. List of other physicians I.  Hospitalizations, surgeries, and ER visits in previous 12 months J.  Vitals K. Screenings such as hearing and  vision if needed, cognitive and depression L. Referrals and appointments - none  In addition, I have reviewed and discussed with patient certain preventive protocols, quality metrics, and best practice recommendations. A written personalized care plan for preventive services as well as general preventive health recommendations were provided to patient.  See attached scanned questionnaire for additional information.   Signed,  Hyacinth MeekerMckenzie Ariyah Sedlack, LPN Nurse Health Advisor   MD Recommendations: Pt needs a tetanus vaccine. Pt to check with insurance and depending on coverage will contact us about receiving this vaccine.   I, as supervising physician, have reviewed the nurse health advisor's Medicare Wellness Visit note for this patient and concur with the findings and recommendations listed above.  Signed Syed Asad A. Sherryll BurgerShah MD Attending Physician.

## 2017-01-22 LAB — URINALYSIS, MICROSCOPIC ONLY
Casts: NONE SEEN [LPF]
WBC, UA: >60 WBC/HPF — AB (ref <=5)
Yeast: NONE SEEN [HPF]

## 2017-01-23 LAB — URINE CULTURE

## 2017-03-27 DIAGNOSIS — F633 Trichotillomania: Secondary | ICD-10-CM | POA: Diagnosis not present

## 2017-03-27 DIAGNOSIS — F4310 Post-traumatic stress disorder, unspecified: Secondary | ICD-10-CM | POA: Diagnosis not present

## 2017-03-27 DIAGNOSIS — F3181 Bipolar II disorder: Secondary | ICD-10-CM | POA: Diagnosis not present

## 2017-04-16 ENCOUNTER — Encounter: Payer: Self-pay | Admitting: Emergency Medicine

## 2017-04-16 ENCOUNTER — Emergency Department
Admission: EM | Admit: 2017-04-16 | Discharge: 2017-04-16 | Disposition: A | Discharge status: Home / Self Care | Payer: Medicare Other | Source: Home / Self Care | Attending: Emergency Medicine | Admitting: Emergency Medicine

## 2017-04-16 ENCOUNTER — Emergency Department: Payer: Medicare Other

## 2017-04-16 DIAGNOSIS — N200 Calculus of kidney: Secondary | ICD-10-CM | POA: Insufficient documentation

## 2017-04-16 DIAGNOSIS — N202 Calculus of kidney with calculus of ureter: Secondary | ICD-10-CM | POA: Diagnosis not present

## 2017-04-16 DIAGNOSIS — F17210 Nicotine dependence, cigarettes, uncomplicated: Secondary | ICD-10-CM

## 2017-04-16 DIAGNOSIS — M797 Fibromyalgia: Secondary | ICD-10-CM | POA: Diagnosis not present

## 2017-04-16 DIAGNOSIS — R109 Unspecified abdominal pain: Secondary | ICD-10-CM | POA: Diagnosis not present

## 2017-04-16 DIAGNOSIS — Z79899 Other long term (current) drug therapy: Secondary | ICD-10-CM

## 2017-04-16 DIAGNOSIS — N179 Acute kidney failure, unspecified: Secondary | ICD-10-CM | POA: Diagnosis not present

## 2017-04-16 DIAGNOSIS — K219 Gastro-esophageal reflux disease without esophagitis: Secondary | ICD-10-CM | POA: Diagnosis not present

## 2017-04-16 DIAGNOSIS — F319 Bipolar disorder, unspecified: Secondary | ICD-10-CM | POA: Diagnosis not present

## 2017-04-16 DIAGNOSIS — D680 Von Willebrand's disease: Secondary | ICD-10-CM | POA: Diagnosis not present

## 2017-04-16 DIAGNOSIS — N132 Hydronephrosis with renal and ureteral calculous obstruction: Secondary | ICD-10-CM | POA: Diagnosis not present

## 2017-04-16 DIAGNOSIS — N201 Calculus of ureter: Secondary | ICD-10-CM | POA: Diagnosis not present

## 2017-04-16 LAB — BASIC METABOLIC PANEL
Anion gap: 8 (ref 5–15)
BUN: 15 mg/dL (ref 6–20)
CALCIUM: 9 mg/dL (ref 8.9–10.3)
CHLORIDE: 106 mmol/L (ref 101–111)
CO2: 23 mmol/L (ref 22–32)
CREATININE: 0.89 mg/dL (ref 0.44–1.00)
GFR calc non Af Amer: >60 mL/min (ref >60)
GLUCOSE: 97 mg/dL (ref 65–99)
Potassium: 4.4 mmol/L (ref 3.5–5.1)
Sodium: 137 mmol/L (ref 135–145)

## 2017-04-16 LAB — URINALYSIS, COMPLETE (UACMP) WITH MICROSCOPIC
BILIRUBIN URINE: NEGATIVE
Glucose, UA: NEGATIVE mg/dL
KETONES UR: NEGATIVE mg/dL
LEUKOCYTES UA: NEGATIVE
NITRITE: NEGATIVE
PH: 6 (ref 5.0–8.0)
Protein, ur: 100 mg/dL — AB
SPECIFIC GRAVITY, URINE: 1.023 (ref 1.005–1.030)

## 2017-04-16 LAB — CBC
HCT: 42.1 % (ref 35.0–47.0)
Hemoglobin: 14 g/dL (ref 12.0–16.0)
MCH: 31.1 pg (ref 26.0–34.0)
MCHC: 33.2 g/dL (ref 32.0–36.0)
MCV: 93.5 fL (ref 80.0–100.0)
PLATELETS: 264 10*3/uL (ref 150–440)
RBC: 4.5 MIL/uL (ref 3.80–5.20)
RDW: 12.8 % (ref 11.5–14.5)
WBC: 6.4 10*3/uL (ref 3.6–11.0)

## 2017-04-16 LAB — POCT PREGNANCY, URINE: PREG TEST UR: NEGATIVE

## 2017-04-16 MED ORDER — KETOROLAC TROMETHAMINE 30 MG/ML IJ SOLN
30.0000 mg | Freq: Once | INTRAMUSCULAR | Status: AC
  Administered 2017-04-16: 30 mg via INTRAVENOUS
  Filled 2017-04-16: qty 1

## 2017-04-16 MED ORDER — OXYCODONE-ACETAMINOPHEN 5-325 MG PO TABS
1.0000 | ORAL_TABLET | ORAL | Status: AC
  Administered 2017-04-16: 1 via ORAL

## 2017-04-16 MED ORDER — FLUCONAZOLE 100 MG PO TABS: 100.0000 mg | ORAL_TABLET | Freq: Every day | ORAL | 0 refills | Status: DC

## 2017-04-16 MED ORDER — CIPROFLOXACIN IN D5W 400 MG/200ML IV SOLN
400.0000 mg | Freq: Once | INTRAVENOUS | Status: DC
  Administered 2017-04-16: 400 mg via INTRAVENOUS
  Filled 2017-04-16 (×2): qty 200

## 2017-04-16 MED ORDER — SODIUM CHLORIDE 0.9 % IV BOLUS (SEPSIS)
1000.0000 mL | Freq: Once | INTRAVENOUS | Status: AC
  Administered 2017-04-16: 1000 mL via INTRAVENOUS

## 2017-04-16 MED ORDER — ONDANSETRON HCL 4 MG/2ML IJ SOLN
4.0000 mg | Freq: Once | INTRAMUSCULAR | Status: AC
  Administered 2017-04-16: 4 mg via INTRAVENOUS
  Filled 2017-04-16: qty 2

## 2017-04-16 MED ORDER — OXYCODONE-ACETAMINOPHEN 5-325 MG PO TABS: 1.0000 | ORAL_TABLET | Freq: Four times a day (QID) | ORAL | 0 refills | Status: DC | PRN

## 2017-04-16 MED ORDER — ONDANSETRON 4 MG PO TBDP
4.0000 mg | ORAL_TABLET | Freq: Once | ORAL | Status: AC
  Administered 2017-04-16: 4 mg via ORAL
  Filled 2017-04-16: qty 1

## 2017-04-16 MED ORDER — CEPHALEXIN 500 MG PO CAPS: 500.0000 mg | ORAL_CAPSULE | Freq: Two times a day (BID) | ORAL | 0 refills | Status: DC

## 2017-04-16 MED ORDER — DIPHENHYDRAMINE HCL 50 MG/ML IJ SOLN
25.0000 mg | Freq: Once | INTRAMUSCULAR | Status: AC
  Administered 2017-04-16: 25 mg via INTRAVENOUS

## 2017-04-16 MED ORDER — DIPHENHYDRAMINE HCL 50 MG/ML IJ SOLN
INTRAMUSCULAR | Status: AC
  Filled 2017-04-16: qty 1

## 2017-04-16 MED ORDER — MORPHINE SULFATE (PF) 4 MG/ML IV SOLN
4.0000 mg | Freq: Once | INTRAVENOUS | Status: AC
  Administered 2017-04-16: 4 mg via INTRAVENOUS
  Filled 2017-04-16: qty 1

## 2017-04-16 MED ORDER — ONDANSETRON 4 MG PO TBDP: 4.0000 mg | ORAL_TABLET | Freq: Four times a day (QID) | ORAL | 0 refills | Status: DC | PRN

## 2017-04-16 MED ORDER — OXYCODONE-ACETAMINOPHEN 5-325 MG PO TABS
1.0000 | ORAL_TABLET | ORAL | Status: DC | PRN
  Administered 2017-04-16: 1 via ORAL
  Filled 2017-04-16 (×2): qty 1

## 2017-04-16 MED ORDER — CIPROFLOXACIN HCL 500 MG PO TABS: 500.0000 mg | ORAL_TABLET | Freq: Two times a day (BID) | ORAL | 0 refills | Status: DC

## 2017-04-16 NOTE — ED Notes (Signed)
ED Provider at bedside. 

## 2017-04-16 NOTE — ED Notes (Signed)
meds given.  Iv meds infusing.  Family with pt.

## 2017-04-16 NOTE — ED Notes (Signed)
Patient transported to CT 

## 2017-04-16 NOTE — Discharge Instructions (Signed)
You have been seen in the Emergency Department (ED) today for pain that we believe based on your workup, is caused by kidney stones.  As we have discussed, please drink plenty of fluids.  Please make a follow up appointment with the physician(s) listed elsewhere in this documentation. ° °You may take pain medication as needed but ONLY as prescribed.  Please also take your prescribed Flomax daily.  We also recommend that you take over-the-counter ibuprofen regularly according to label instructions over the next 5 days.  Take it with meals to minimize stomach discomfort. ° °Please see your doctor as soon as possible as stones may take 1-3 weeks to pass and you may require additional care or medications. ° °Do not drink alcohol, drive or participate in any other potentially dangerous activities while taking opiate pain medication as it may make you sleepy. Do not take this medication with any other sedating medications, either prescription or over-the-counter. If you were prescribed Percocet or Vicodin, do not take these with acetaminophen (Tylenol) as it is already contained within these medications. °  °This medication is an opiate (or narcotic) pain medication and can be habit forming.  Use it as little as possible to achieve adequate pain control.  Do not use or use it with extreme caution if you have a history of opiate abuse or dependence.  If you are on a pain contract with your primary care doctor or a pain specialist, be sure to let them know you were prescribed this medication today from the [facility].  This medication is intended for your use only - do not give any to anyone else and keep it in a secure place where nobody else, especially children, have access to it.  It will also cause or worsen constipation, so you may want to consider taking an over-the-counter stool softener while you are taking this medication. ° °Return to the Emergency Department (ED) or call your doctor  if you have any worsening pain, fever, painful urination, are unable to urinate, or develop other symptoms that concern you. ° °

## 2017-04-16 NOTE — ED Triage Notes (Signed)
Pt in via POV with complaints of left flank pain w/ some nausea x one day.  Pt reports known 4mm stone x approximately one year, states, "it hasn't really bothered me because it has been lodged, it is moving now."  Vitals WDL.

## 2017-04-16 NOTE — ED Provider Notes (Signed)
[facility] Emergency Department Provider Note   ____________________________________________   First MD Initiated Contact with Patient 04/16/17 1415     (approximate)  I have reviewed the triage vital signs and the nursing notes.   HISTORY  Chief Complaint Nephrolithiasis    HPI Casey Hodges is a 41 y.o. female reports a history of multiple kidney stones in the past.  She reports that this morning at about 11 AM she had a sudden onset of a severe sharp pain in her left flank and kidney area, radiating down to her left lower quadrant.  Associated with nausea and vomiting up yellow emesis which she reports caused by her pain.  Reports her urine was normal up until that point, at which point now it seems to be slightly reddish tinged.  No fevers or chills.  No pain with urination.  Pain report is about 9 out of 10, she reports previous pregnancy once where she had to have a kidney stone stented and reports that that pain was worse  She reports her left ovary and uterus have been removed previous.  She does still have a right ovary but denies any right-sided pain.  Past Medical History:  Diagnosis Date  . Abnormal vaginal Pap smear    10+ years ago- no colpo repeat was normal  . Anxiety   . AR (allergic rhinitis)   . Bipolar affective (HCC)    pt reported  . Bipolar disorder (HCC)   . Eating disorder    Under control per patient  . Fibromyalgia   . GERD (gastroesophageal reflux disease)   . Kidney stone   . Painful intercourse   . Painful menstrual periods   . Pelvic pain in female   . PTSD (post-traumatic stress disorder)   . Renal disorder   . Spinal stenosis   . Uterine polyp   . VWD (acquired von Willebrand's disease) [facility](HCC)     Patient Active Problem List   Diagnosis Date Noted  . Orthostasis 08/24/2016  . Antibiotic-induced yeast infection 08/22/2016  . Exercise-induced tachycardia 08/22/2016  . Constipation due to opioid therapy  11/04/2015  . Severe episode of recurrent major depressive disorder, without psychotic features (HCC)   . GERD (gastroesophageal reflux disease) 09/16/2015  . Smoker 07/25/2015  . Snoring 06/09/2015  . Cardiac murmur 05/25/2015  . Drug-induced nausea and vomiting 03/22/2015  . Cyst, bone 02/14/2015  . Pelvic adhesive disease 01/21/2015  . Arthritis 12/19/2014  . Kidney stone on left side 12/13/2014  . Anxiety disorder 12/13/2014  . Postural dizziness 12/13/2014  . Subcutaneous cyst 12/13/2014  . Nephrolithiasis 05/23/2012  . Chronic pelvic pain in female 05/23/2012  . Fibromyalgia 05/23/2012  . Bipolar disorder with depression (HCC) 06/05/2011    Past Surgical History:  Procedure Laterality Date  . ABDOMINAL HYSTERECTOMY    . HYSTEROSCOPY     removed polyps  . LAPAROSCOPIC VAGINAL HYSTERECTOMY  2015   at [facility]  . LAPAROSCOPY Left 01/10/2015   Procedure: LAPAROSCOPY OPERATIVE with biopsy, left oopherectomy;  Surgeon: Herold HarmsMartin A Defrancesco, MD;  Location: [facility] ORS;  Service: Gynecology;  Laterality: Left;  . LAPAROSCOPY ABDOMEN DIAGNOSTIC    . TUBAL LIGATION      Prior to Admission medications   Medication Sig Start Date End Date Taking? Authorizing Provider  ALPRAZolam Prudy Feeler(XANAX) 1 MG tablet 2 (two) times daily.    [provider]  cephALEXin (KEFLEX) 500 MG capsule Take 1 capsule (500 mg total) by mouth 2 (two) times  daily. 04/16/17   Sharyn Creamer, MD  fluconazole (DIFLUCAN) 100 MG tablet Take 1 tablet (100 mg total) by mouth daily. Take 1 tablet, then if persistent symptoms take a second tablet in 3 days 04/16/17   Sharyn Creamer, MD  ibuprofen (ADVIL,MOTRIN) 200 MG tablet Take 200 mg by mouth every 6 (six) hours as needed.    [provider]  ondansetron (ZOFRAN ODT) 4 MG disintegrating tablet Take 1 tablet (4 mg total) by mouth every 6 (six) hours as needed for nausea or vomiting. 04/16/17   Sharyn Creamer, MD  oxyCODONE-acetaminophen (ROXICET) 5-325 MG tablet  Take 1 tablet by mouth every 6 (six) hours as needed for severe pain. 04/16/17   Sharyn Creamer, MD  traZODone (DESYREL) 100 MG tablet Take 200 mg by mouth at bedtime.  05/11/08   [provider]  valACYclovir (VALTREX) 500 MG tablet Take 1 tablet (500 mg total) by mouth daily. 01/01/17   Doreene Burke, CNM  zolpidem (AMBIEN) 10 MG tablet Take 1 tablet by mouth at bedtime as needed.  03/27/16   [provider]    Allergies Wellbutrin [bupropion hcl]; Augmentin [amoxicillin-pot clavulanate]; Lasix [furosemide]; and Nitrofurantoin monohyd macro  Family History  Problem Relation Age of Onset  . Hypertension Father   . Sleep apnea Mother   . Breast cancer Paternal Grandmother   . Diabetes Maternal Grandmother   . Diabetes Maternal Aunt   . Diabetes Maternal Uncle     Social History Social History  Substance Use Topics  . Smoking status: Current Every Day Smoker    Packs/day: 1.00    Types: Cigarettes    Start date: 05/18/1995  . Smokeless tobacco: Never Used  . Alcohol use 0.0 oz/week     Comment: Socially- seldom    Review of Systems Constitutional: No fever/chills Eyes: No visual changes. ENT: No sore throat. Cardiovascular: Denies chest pain. Respiratory: Denies shortness of breath. Gastrointestinal: No right-sided abdominal pain.  No diarrhea.  No constipation. Genitourinary: Negative for dysuria.  Does report urine seems slightly dark after symptoms started Musculoskeletal: Negative for back pain. Skin: Negative for rash. Neurological: Negative for headaches, focal weakness or numbness.    ____________________________________________   PHYSICAL EXAM:  VITAL SIGNS: ED Triage Vitals  Enc Vitals Group     BP 04/16/17 1308 138/87     Pulse Rate 04/16/17 1308 72     Resp 04/16/17 1308 18     Temp 04/16/17 1308 98.8 F (37.1 C)     Temp Source 04/16/17 1308 Oral     SpO2 04/16/17 1308 100 %     Weight 04/16/17 1309 140 lb (63.5 kg)     Height  04/16/17 1309 5\' 2"  (1.575 m)     Head Circumference --      Peak Flow --      Pain Score 04/16/17 1308 9     Pain Loc --      Pain Edu? --      Excl. in GC? --     Constitutional: Alert and oriented.  Appears in moderate to severe pain, laying on her side holding her left flank. Eyes: Conjunctivae are normal. Head: Atraumatic. Nose: No congestion/rhinnorhea. Mouth/Throat: Mucous membranes are moist. Neck: No stridor.   Cardiovascular: Normal rate, regular rhythm. Grossly normal heart sounds.  Good peripheral circulation. Respiratory: Normal respiratory effort.  No retractions. Lungs CTAB. Gastrointestinal: Soft and nontender except minimal discomfort in the left flank without peritonitis. No distention. Musculoskeletal: No lower extremity tenderness nor edema.  Neurologic:  Normal speech and language. No gross focal neurologic deficits are appreciated.  Skin:  Skin is warm, dry and intact. No rash noted. Psychiatric: Mood and affect are normal. Speech and behavior are normal.  ____________________________________________   LABS (all labs ordered are listed, but only abnormal results are displayed)  Labs Reviewed  URINALYSIS, COMPLETE (UACMP) WITH MICROSCOPIC - Abnormal; Notable for the following:       Result Value   Color, Urine AMBER (*)    APPearance CLOUDY (*)    Hgb urine dipstick LARGE (*)    Protein, ur 100 (*)    Bacteria, UA FEW (*)    Squamous Epithelial / LPF 6-30 (*)    All other components within normal limits  URINE CULTURE  BASIC METABOLIC PANEL  CBC  POC URINE PREG, ED  POCT PREGNANCY, URINE   ____________________________________________  EKG   ____________________________________________  RADIOLOGY  Ct Renal Stone Study  Result Date: 04/16/2017 CLINICAL DATA:  LEFT flank pain with nausea for 1 day, history of kidney stones EXAM: CT ABDOMEN AND PELVIS WITHOUT CONTRAST TECHNIQUE: Multidetector CT imaging of the abdomen and pelvis was performed  following the standard protocol without IV contrast. Sagittal and coronal MPR images reconstructed from axial data set. Oral contrast not administered for this indication. COMPARISON:  01/21/2017 FINDINGS: Lower chest: Visualized lung bases clear Hepatobiliary: Entirety of liver not imaged. Visualized liver and gallbladder unremarkable Pancreas: Normal appearance Spleen: Normal appearance Adrenals/Urinary Tract: Adrenal glands, RIGHT kidney, RIGHT ureter, and bladder normal appearance. LEFT hydronephrosis and hydroureter secondary to a distal LEFT ureteral calculus 3 mm diameter image 63. This stone was previously located at the inferior pole of the LEFT kidney. 1.5 cm diameter exophytic cyst at anterior aspect mid LEFT kidney image 19. No additional renal mass lesions. Stomach/Bowel: Normal appendix. Stomach and bowel loops unremarkable for technique Vascular/Lymphatic: Scattered pelvic phleboliths. Aorta normal caliber. No adenopathy. Reproductive: Post hysterectomy.  Unremarkable ovaries. Other: No free air or free fluid.  No definite hernia. Musculoskeletal: Unremarkable IMPRESSION: LEFT hydronephrosis and hydroureter secondary to a 3 mm distal LEFT ureteral calculus. Electronically Signed   By: Ulyses Southward M.D.   On: 04/16/2017 14:55    CT reviewed, left-sided kidney stone with hydro- ____________________________________________   PROCEDURES  Procedure(s) performed: None  Procedures  Critical Care performed: No  ____________________________________________   INITIAL IMPRESSION / ASSESSMENT AND PLAN / ED COURSE  Pertinent labs & imaging results that were available during my care of the patient were reviewed by me and considered in my medical decision making (see chart for details).  Differential diagnosis includes but is not limited to, abdominal perforation, aortic dissection, cholecystitis, appendicitis, diverticulitis, colitis, esophagitis/gastritis, kidney stone, pyelonephritis,  urinary tract infection, etc. All are considered in decision and treatment plan. Based upon the patient's presentation and risk factors, proceed with noncontrast CT.  Her clinical history seems very suggestive of a kidney stone.  No systemic or infectious symptoms   Clinical Course as of Apr 16 1640  Tue Apr 16, 2017  1512 Patient resting comfortably, reports her pain is much better.  She is fully awake and alert, reports mild ongoing pain.    [MQ]  1637 Patient improved.  Itching is gone away, only minimal redness now noted over the skin and she reports feeling much better.  She will take Benadryl 25 mg this evening as well.  I suspect this is likely due to her morphine, feel less likely Cipro or Percocet.  Patient reports she has  had Percocet many times in the past with no problems.  We will give her a prescription for Percocet, also cephalexin.  She reports she has allergy to Augmentin only and not to penicillins, only to the additive and Augmentin which causes her bowels to cramp. Return precautions and treatment recommendations and follow-up discussed with the patient who is agreeable with the plan.   [MQ]    Clinical Course User Index [MQ] Sharyn Creamer, MD   ----------------------------------------- 3:36 PM on 04/16/2017 -----------------------------------------  Reviewed clinical presentation, CT, and urinalysis findings with Dr. Vanna Scotland of urology.  He advises discharge with Cipro sounds like a good plan, follow-up in the clinic.  Very careful return precautions advised the patient especially if she is to develop any symptoms of infection such as foul-smelling odor, significant burning with urination, fevers chills or persistent or severe pain she should return the emergency room right away.  She demonstrates no evidence of infection at this time, I suspect likely some contamination of the urinalysis and no leukocytes or nitrates are noted but will place on Cipro as a precaution in  the event a mild infection may be present.  I will prescribe the patient a narcotic pain medicine due to their condition which I anticipate will cause at least moderate pain short term. I discussed with the patient safe use of narcotic pain medicines, and that they are not to drive, work in dangerous areas, or ever take more than prescribed (no more than 1 pill every 6 hours). We discussed that this is the type of medication that can be  overdosed on and the risks of this type of medicine. Patient is very agreeable to only use as prescribed and to never use more than prescribed.  ----------------------------------------- 3:39 PM on 04/16/2017 -----------------------------------------  Patient getting ready for discharge, she is now reporting of and has a slight blotchy rash and itching.  She reports she is had some itching in the past with morphine, and she began itching shortly after having the morphine.  She had not listed this as an allergy, and did not reported as an allergy earlier.  This may be related to localized histamine release, but I suspect she likely has a mild allergy to morphine causing her itching.  No evidence of anaphylaxis, airway intact, vital signs are stable.  Will give IV Benadryl and stop her Cipro infusion at this point.  Continue to monitor closely ____________________________________________   FINAL CLINICAL IMPRESSION(S) / ED DIAGNOSES  Final diagnoses:  Kidney stone on left side      NEW MEDICATIONS STARTED DURING THIS VISIT:  New Prescriptions   CEPHALEXIN (KEFLEX) 500 MG CAPSULE    Take 1 capsule (500 mg total) by mouth 2 (two) times daily.   FLUCONAZOLE (DIFLUCAN) 100 MG TABLET    Take 1 tablet (100 mg total) by mouth daily. Take 1 tablet, then if persistent symptoms take a second tablet in 3 days   ONDANSETRON (ZOFRAN ODT) 4 MG DISINTEGRATING TABLET    Take 1 tablet (4 mg total) by mouth every 6 (six) hours as needed for nausea or vomiting.    OXYCODONE-ACETAMINOPHEN (ROXICET) 5-325 MG TABLET    Take 1 tablet by mouth every 6 (six) hours as needed for severe pain.     Note:  This document was prepared using Dragon voice recognition software and may include unintentional dictation errors.     Sharyn Creamer, MD 04/16/17 [phone]

## 2017-04-16 NOTE — ED Notes (Signed)
Pt has itching and rash on neck, face and back.  No resp distress.   md aware  meds given  cipro stopped.  Iv fluids infusing.

## 2017-04-17 DIAGNOSIS — F4310 Post-traumatic stress disorder, unspecified: Secondary | ICD-10-CM | POA: Diagnosis not present

## 2017-04-17 DIAGNOSIS — F633 Trichotillomania: Secondary | ICD-10-CM | POA: Diagnosis not present

## 2017-04-17 DIAGNOSIS — F3181 Bipolar II disorder: Secondary | ICD-10-CM | POA: Diagnosis not present

## 2017-04-17 LAB — URINE CULTURE
Culture: NO GROWTH
SPECIAL REQUESTS: NORMAL

## 2017-04-19 ENCOUNTER — Inpatient Hospital Stay: Payer: Medicare Other | Admitting: Anesthesiology

## 2017-04-19 ENCOUNTER — Inpatient Hospital Stay
Admission: EM | Admit: 2017-04-19 | Discharge: 2017-04-19 | DRG: 660 | Discharge status: Against Medical Advice | Payer: Medicare Other | Attending: Internal Medicine | Admitting: Internal Medicine

## 2017-04-19 ENCOUNTER — Encounter
Admission: EM | Discharge status: Against Medical Advice | Payer: Self-pay | Source: Home / Self Care | Attending: Internal Medicine

## 2017-04-19 ENCOUNTER — Emergency Department: Payer: Medicare Other

## 2017-04-19 DIAGNOSIS — D680 Von Willebrand's disease: Secondary | ICD-10-CM | POA: Diagnosis present

## 2017-04-19 DIAGNOSIS — F419 Anxiety disorder, unspecified: Secondary | ICD-10-CM | POA: Diagnosis present

## 2017-04-19 DIAGNOSIS — M797 Fibromyalgia: Secondary | ICD-10-CM | POA: Diagnosis present

## 2017-04-19 DIAGNOSIS — F4310 Post-traumatic stress disorder, unspecified: Secondary | ICD-10-CM | POA: Diagnosis present

## 2017-04-19 DIAGNOSIS — Z833 Family history of diabetes mellitus: Secondary | ICD-10-CM

## 2017-04-19 DIAGNOSIS — N200 Calculus of kidney: Secondary | ICD-10-CM

## 2017-04-19 DIAGNOSIS — R109 Unspecified abdominal pain: Secondary | ICD-10-CM | POA: Diagnosis not present

## 2017-04-19 DIAGNOSIS — F3130 Bipolar disorder, current episode depressed, mild or moderate severity, unspecified: Secondary | ICD-10-CM | POA: Diagnosis not present

## 2017-04-19 DIAGNOSIS — F319 Bipolar disorder, unspecified: Secondary | ICD-10-CM | POA: Diagnosis present

## 2017-04-19 DIAGNOSIS — Z881 Allergy status to other antibiotic agents status: Secondary | ICD-10-CM

## 2017-04-19 DIAGNOSIS — N179 Acute kidney failure, unspecified: Secondary | ICD-10-CM | POA: Diagnosis not present

## 2017-04-19 DIAGNOSIS — K219 Gastro-esophageal reflux disease without esophagitis: Secondary | ICD-10-CM | POA: Diagnosis present

## 2017-04-19 DIAGNOSIS — Z888 Allergy status to other drugs, medicaments and biological substances status: Secondary | ICD-10-CM | POA: Diagnosis not present

## 2017-04-19 DIAGNOSIS — Z803 Family history of malignant neoplasm of breast: Secondary | ICD-10-CM

## 2017-04-19 DIAGNOSIS — N201 Calculus of ureter: Secondary | ICD-10-CM | POA: Diagnosis not present

## 2017-04-19 DIAGNOSIS — Z90710 Acquired absence of both cervix and uterus: Secondary | ICD-10-CM

## 2017-04-19 DIAGNOSIS — R1032 Left lower quadrant pain: Secondary | ICD-10-CM | POA: Diagnosis not present

## 2017-04-19 DIAGNOSIS — Z8249 Family history of ischemic heart disease and other diseases of the circulatory system: Secondary | ICD-10-CM

## 2017-04-19 DIAGNOSIS — N132 Hydronephrosis with renal and ureteral calculous obstruction: Secondary | ICD-10-CM | POA: Diagnosis not present

## 2017-04-19 DIAGNOSIS — F17210 Nicotine dependence, cigarettes, uncomplicated: Secondary | ICD-10-CM | POA: Diagnosis present

## 2017-04-19 DIAGNOSIS — Z716 Tobacco abuse counseling: Secondary | ICD-10-CM | POA: Diagnosis not present

## 2017-04-19 HISTORY — PX: CYSTOSCOPY WITH STENT PLACEMENT: SHX5790

## 2017-04-19 HISTORY — PX: URETEROSCOPY WITH HOLMIUM LASER LITHOTRIPSY: SHX6645

## 2017-04-19 LAB — BASIC METABOLIC PANEL
Anion gap: 6 (ref 5–15)
BUN: 14 mg/dL (ref 6–20)
CALCIUM: 8.3 mg/dL — AB (ref 8.9–10.3)
CHLORIDE: 108 mmol/L (ref 101–111)
CO2: 25 mmol/L (ref 22–32)
CREATININE: 1.39 mg/dL — AB (ref 0.44–1.00)
GFR calc non Af Amer: 47 mL/min — ABNORMAL LOW (ref >60)
GFR, EST AFRICAN AMERICAN: 54 mL/min — AB (ref >60)
Glucose, Bld: 93 mg/dL (ref 65–99)
Potassium: 4.3 mmol/L (ref 3.5–5.1)
SODIUM: 139 mmol/L (ref 135–145)

## 2017-04-19 LAB — COMPREHENSIVE METABOLIC PANEL
ALK PHOS: 54 U/L (ref 38–126)
ALT: 12 U/L — ABNORMAL LOW (ref 14–54)
ANION GAP: 4 — AB (ref 5–15)
AST: 18 U/L (ref 15–41)
Albumin: 3.6 g/dL (ref 3.5–5.0)
BILIRUBIN TOTAL: 0.7 mg/dL (ref 0.3–1.2)
BUN: 14 mg/dL (ref 6–20)
CALCIUM: 8.9 mg/dL (ref 8.9–10.3)
CO2: 28 mmol/L (ref 22–32)
Chloride: 108 mmol/L (ref 101–111)
Creatinine, Ser: 1.41 mg/dL — ABNORMAL HIGH (ref 0.44–1.00)
GFR, EST AFRICAN AMERICAN: 53 mL/min — AB (ref >60)
GFR, EST NON AFRICAN AMERICAN: 46 mL/min — AB (ref >60)
Glucose, Bld: 92 mg/dL (ref 65–99)
Potassium: 3.7 mmol/L (ref 3.5–5.1)
SODIUM: 140 mmol/L (ref 135–145)
TOTAL PROTEIN: 6.8 g/dL (ref 6.5–8.1)

## 2017-04-19 LAB — CBC
HEMATOCRIT: 40.3 % (ref 35.0–47.0)
HEMOGLOBIN: 13.4 g/dL (ref 12.0–16.0)
MCH: 31 pg (ref 26.0–34.0)
MCHC: 33.2 g/dL (ref 32.0–36.0)
MCV: 93.6 fL (ref 80.0–100.0)
Platelets: 232 10*3/uL (ref 150–440)
RBC: 4.3 MIL/uL (ref 3.80–5.20)
RDW: 12.7 % (ref 11.5–14.5)
WBC: 13.3 10*3/uL — ABNORMAL HIGH (ref 3.6–11.0)

## 2017-04-19 LAB — URINALYSIS, COMPLETE (UACMP) WITH MICROSCOPIC
BILIRUBIN URINE: NEGATIVE
GLUCOSE, UA: NEGATIVE mg/dL
Ketones, ur: NEGATIVE mg/dL
LEUKOCYTES UA: NEGATIVE
NITRITE: NEGATIVE
PH: 5 (ref 5.0–8.0)
Protein, ur: NEGATIVE mg/dL
Specific Gravity, Urine: 1.019 (ref 1.005–1.030)

## 2017-04-19 SURGERY — URETEROSCOPY, WITH LITHOTRIPSY USING HOLMIUM LASER
Anesthesia: General | Laterality: Left

## 2017-04-19 SURGERY — CERCLAGE, CERVIX, VAGINAL APPROACH
Anesthesia: Choice

## 2017-04-19 MED ORDER — DEXAMETHASONE SODIUM PHOSPHATE 10 MG/ML IJ SOLN
INTRAMUSCULAR | Status: AC
  Filled 2017-04-19: qty 1

## 2017-04-19 MED ORDER — DEXAMETHASONE SODIUM PHOSPHATE 10 MG/ML IJ SOLN
INTRAMUSCULAR | Status: DC | PRN
  Administered 2017-04-19: 8 mg via INTRAVENOUS

## 2017-04-19 MED ORDER — ONDANSETRON HCL 4 MG PO TABS: 4.0000 mg | ORAL_TABLET | Freq: Four times a day (QID) | ORAL | Status: DC | PRN

## 2017-04-19 MED ORDER — SUGAMMADEX SODIUM 200 MG/2ML IV SOLN
INTRAVENOUS | Status: AC
  Filled 2017-04-19: qty 2

## 2017-04-19 MED ORDER — LIDOCAINE HCL (CARDIAC) 20 MG/ML IV SOLN
INTRAVENOUS | Status: DC | PRN
  Administered 2017-04-19: 100 mg via INTRAVENOUS

## 2017-04-19 MED ORDER — SODIUM CHLORIDE 0.9 % IV SOLN
INTRAVENOUS | Status: DC
  Administered 2017-04-19: 14:00:00 via INTRAVENOUS

## 2017-04-19 MED ORDER — SUGAMMADEX SODIUM 200 MG/2ML IV SOLN
INTRAVENOUS | Status: DC | PRN
  Administered 2017-04-19: 150 mg via INTRAVENOUS

## 2017-04-19 MED ORDER — ACETAMINOPHEN 650 MG RE SUPP: 650.0000 mg | Freq: Four times a day (QID) | RECTAL | Status: DC | PRN

## 2017-04-19 MED ORDER — PROPOFOL 10 MG/ML IV BOLUS
INTRAVENOUS | Status: DC | PRN
  Administered 2017-04-19: 150 mg via INTRAVENOUS

## 2017-04-19 MED ORDER — ONDANSETRON HCL 4 MG/2ML IJ SOLN: 4.0000 mg | Freq: Four times a day (QID) | INTRAMUSCULAR | Status: DC | PRN

## 2017-04-19 MED ORDER — OXYCODONE HCL 5 MG PO TABS
5.0000 mg | ORAL_TABLET | Freq: Once | ORAL | Status: AC
  Administered 2017-04-19: 5 mg via ORAL
  Filled 2017-04-19: qty 1

## 2017-04-19 MED ORDER — PROPOFOL 10 MG/ML IV BOLUS
INTRAVENOUS | Status: AC
  Filled 2017-04-19: qty 20

## 2017-04-19 MED ORDER — IBUPROFEN 400 MG PO TABS: 600.0000 mg | ORAL_TABLET | Freq: Four times a day (QID) | ORAL | Status: DC | PRN

## 2017-04-19 MED ORDER — KETOROLAC TROMETHAMINE 30 MG/ML IJ SOLN
30.0000 mg | Freq: Once | INTRAMUSCULAR | Status: AC
  Administered 2017-04-19: 30 mg via INTRAVENOUS

## 2017-04-19 MED ORDER — ROCURONIUM BROMIDE 50 MG/5ML IV SOLN
INTRAVENOUS | Status: AC
  Filled 2017-04-19: qty 1

## 2017-04-19 MED ORDER — ACETAMINOPHEN 500 MG PO TABS
1000.0000 mg | ORAL_TABLET | Freq: Once | ORAL | Status: AC
  Administered 2017-04-19: 1000 mg via ORAL
  Filled 2017-04-19: qty 2

## 2017-04-19 MED ORDER — CIPROFLOXACIN IN D5W 400 MG/200ML IV SOLN
INTRAVENOUS | Status: DC | PRN
  Administered 2017-04-19: 400 mg via INTRAVENOUS

## 2017-04-19 MED ORDER — SODIUM CHLORIDE 0.9 % IV BOLUS (SEPSIS)
1000.0000 mL | Freq: Once | INTRAVENOUS | Status: AC
  Administered 2017-04-19: 1000 mL via INTRAVENOUS

## 2017-04-19 MED ORDER — ONDANSETRON HCL 4 MG/2ML IJ SOLN
INTRAMUSCULAR | Status: DC | PRN
  Administered 2017-04-19: 4 mg via INTRAVENOUS

## 2017-04-19 MED ORDER — ACETAMINOPHEN 325 MG PO TABS
650.0000 mg | ORAL_TABLET | Freq: Four times a day (QID) | ORAL | Status: DC | PRN
  Administered 2017-04-19: 650 mg via ORAL
  Filled 2017-04-19: qty 2

## 2017-04-19 MED ORDER — SUCCINYLCHOLINE CHLORIDE 20 MG/ML IJ SOLN
INTRAMUSCULAR | Status: DC | PRN
  Administered 2017-04-19: 100 mg via INTRAVENOUS

## 2017-04-19 MED ORDER — FENTANYL CITRATE (PF) 100 MCG/2ML IJ SOLN
INTRAMUSCULAR | Status: AC
  Filled 2017-04-19: qty 2

## 2017-04-19 MED ORDER — SUCCINYLCHOLINE CHLORIDE 20 MG/ML IJ SOLN
INTRAMUSCULAR | Status: AC
  Filled 2017-04-19: qty 1

## 2017-04-19 MED ORDER — KETOROLAC TROMETHAMINE 30 MG/ML IJ SOLN
30.0000 mg | Freq: Four times a day (QID) | INTRAMUSCULAR | Status: DC | PRN
  Administered 2017-04-19: 30 mg via INTRAVENOUS
  Filled 2017-04-19: qty 1

## 2017-04-19 MED ORDER — CIPROFLOXACIN IN D5W 400 MG/200ML IV SOLN
INTRAVENOUS | Status: AC
  Filled 2017-04-19: qty 200

## 2017-04-19 MED ORDER — MIDAZOLAM HCL 2 MG/2ML IJ SOLN
INTRAMUSCULAR | Status: AC
  Filled 2017-04-19: qty 2

## 2017-04-19 MED ORDER — ONDANSETRON HCL 4 MG/2ML IJ SOLN
INTRAMUSCULAR | Status: AC
  Filled 2017-04-19: qty 2

## 2017-04-19 MED ORDER — LIDOCAINE HCL (PF) 2 % IJ SOLN
INTRAMUSCULAR | Status: AC
  Filled 2017-04-19: qty 10

## 2017-04-19 MED ORDER — FENTANYL CITRATE (PF) 100 MCG/2ML IJ SOLN: 25.0000 ug | INTRAMUSCULAR | Status: DC | PRN

## 2017-04-19 MED ORDER — EPHEDRINE SULFATE 50 MG/ML IJ SOLN
INTRAMUSCULAR | Status: DC | PRN
  Administered 2017-04-19: 10 mg via INTRAVENOUS

## 2017-04-19 MED ORDER — EPHEDRINE SULFATE 50 MG/ML IJ SOLN
INTRAMUSCULAR | Status: AC
  Filled 2017-04-19: qty 1

## 2017-04-19 MED ORDER — MIDAZOLAM HCL 2 MG/2ML IJ SOLN
INTRAMUSCULAR | Status: DC | PRN
  Administered 2017-04-19: 2 mg via INTRAVENOUS

## 2017-04-19 MED ORDER — ENOXAPARIN SODIUM 40 MG/0.4ML ~~LOC~~ SOLN: 40.0000 mg | SUBCUTANEOUS | Status: DC

## 2017-04-19 MED ORDER — KETOROLAC TROMETHAMINE 10 MG PO TABS: 10.0000 mg | ORAL_TABLET | Freq: Four times a day (QID) | ORAL | 0 refills | Status: DC | PRN

## 2017-04-19 MED ORDER — KETOROLAC TROMETHAMINE 30 MG/ML IJ SOLN
INTRAMUSCULAR | Status: AC
  Filled 2017-04-19: qty 1

## 2017-04-19 MED ORDER — SENNOSIDES-DOCUSATE SODIUM 8.6-50 MG PO TABS: 1.0000 | ORAL_TABLET | Freq: Every evening | ORAL | Status: DC | PRN

## 2017-04-19 MED ORDER — FENTANYL CITRATE (PF) 100 MCG/2ML IJ SOLN
INTRAMUSCULAR | Status: DC | PRN
  Administered 2017-04-19 (×2): 50 ug via INTRAVENOUS

## 2017-04-19 MED ORDER — ONDANSETRON HCL 4 MG/2ML IJ SOLN
4.0000 mg | Freq: Once | INTRAMUSCULAR | Status: AC
  Administered 2017-04-19: 4 mg via INTRAVENOUS
  Filled 2017-04-19: qty 2

## 2017-04-19 MED ORDER — MORPHINE SULFATE (PF) 4 MG/ML IV SOLN
4.0000 mg | Freq: Once | INTRAVENOUS | Status: AC
  Administered 2017-04-19: 4 mg via INTRAVENOUS
  Filled 2017-04-19: qty 1

## 2017-04-19 MED ORDER — ROCURONIUM BROMIDE 100 MG/10ML IV SOLN
INTRAVENOUS | Status: DC | PRN
  Administered 2017-04-19: 10 mg via INTRAVENOUS

## 2017-04-19 SURGICAL SUPPLY — 11 items
CONRAY 43 FOR UROLOGY 50M (MISCELLANEOUS) ×3 IMPLANT
FEE TECHNICIAN ONLY PER HOUR (MISCELLANEOUS) ×3 IMPLANT
FIBER LASER LITHO 273 (Laser) ×3 IMPLANT
GLOVE BIOGEL M STRL SZ7.5 (GLOVE) ×3 IMPLANT
GOWN STRL REUS W/TWL XL LVL3 (GOWN DISPOSABLE) ×3 IMPLANT
INFUSOR MANOMETER BAG 3000ML (MISCELLANEOUS) ×3 IMPLANT
IV NS IRRIG 3000ML ARTHROMATIC (IV SOLUTION) ×3 IMPLANT
PACK CYSTO AR (MISCELLANEOUS) ×3 IMPLANT
SENSORWIRE 0.038 NOT ANGLED (WIRE) ×6
STENT URET 6FRX24 CONTOUR (STENTS) ×3 IMPLANT
WIRE SENSOR 0.038 NOT ANGLED (WIRE) ×2 IMPLANT

## 2017-04-19 NOTE — Progress Notes (Signed)
Patient wants to leave AMA but requesting to have prescriptions given to her for pain. MD paged awaiting callback at this time. Casey Hodges,Casey Hodges

## 2017-04-19 NOTE — ED Provider Notes (Signed)
[facility] Emergency Department Provider Note    First MD Initiated Contact with Patient 04/19/17 [id number]     (approximate)  I have reviewed the triage vital signs and the nursing notes.   HISTORY  Chief Complaint Flank Pain (left)    HPI Casey Hodges is a 41 y.o. female with below list chronic medical conditions including recently diagnosed 3 mm left ureterolithiasis on 04/16/2017 presents to the emergency department with worsening left flank pain that is currently 10 out of 10. Patient admits to nausea no vomiting. Patient denies any dysuria. Patient denies any fever or afebrile on presentation.   Past Medical History:  Diagnosis Date  . Abnormal vaginal Pap smear    10+ years ago- no colpo repeat was normal  . Anxiety   . AR (allergic rhinitis)   . Bipolar affective (HCC)    pt reported  . Bipolar disorder (HCC)   . Eating disorder    Under control per patient  . Fibromyalgia   . GERD (gastroesophageal reflux disease)   . Kidney stone   . Painful intercourse   . Painful menstrual periods   . Pelvic pain in female   . PTSD (post-traumatic stress disorder)   . Renal disorder   . Spinal stenosis   . Uterine polyp   . VWD (acquired von Willebrand's disease) [facility])     Patient Active Problem List   Diagnosis Date Noted  . Orthostasis 08/24/2016  . Antibiotic-induced yeast infection 08/22/2016  . Exercise-induced tachycardia 08/22/2016  . Constipation due to opioid therapy 11/04/2015  . Severe episode of recurrent major depressive disorder, without psychotic features (HCC)   . GERD (gastroesophageal reflux disease) 09/16/2015  . Smoker 07/25/2015  . Snoring 06/09/2015  . Cardiac murmur 05/25/2015  . Drug-induced nausea and vomiting 03/22/2015  . Cyst, bone 02/14/2015  . Pelvic adhesive disease 01/21/2015  . Arthritis 12/19/2014  . Kidney stone on left side 12/13/2014  . Anxiety disorder 12/13/2014  . Postural dizziness 12/13/2014  .  Subcutaneous cyst 12/13/2014  . Nephrolithiasis 05/23/2012  . Chronic pelvic pain in female 05/23/2012  . Fibromyalgia 05/23/2012  . Bipolar disorder with depression (HCC) 06/05/2011    Past Surgical History:  Procedure Laterality Date  . ABDOMINAL HYSTERECTOMY    . HYSTEROSCOPY     removed polyps  . LAPAROSCOPIC VAGINAL HYSTERECTOMY  2015   at [facility]  . LAPAROSCOPY Left 01/10/2015   Procedure: LAPAROSCOPY OPERATIVE with biopsy, left oopherectomy;  Surgeon: Herold Harms, MD;  Location: [facility] ORS;  Service: Gynecology;  Laterality: Left;  . LAPAROSCOPY ABDOMEN DIAGNOSTIC    . TUBAL LIGATION      Prior to Admission medications   Medication Sig Start Date End Date Taking? Authorizing Provider  cephALEXin (KEFLEX) 500 MG capsule Take 1 capsule (500 mg total) by mouth 2 (two) times daily. 04/16/17  Yes Sharyn Creamer, MD  fluconazole (DIFLUCAN) 100 MG tablet Take 1 tablet (100 mg total) by mouth daily. Take 1 tablet, then if persistent symptoms take a second tablet in 3 days 04/16/17  Yes Sharyn Creamer, MD  traZODone (DESYREL) 100 MG tablet Take 200 mg by mouth at bedtime.  05/11/08  Yes [provider]  ALPRAZolam Prudy Feeler) 1 MG tablet Take by mouth 2 (two) times daily as needed.     [provider]  ketorolac (TORADOL) 10 MG tablet Take 1 tablet (10 mg total) by mouth every 6 (six) hours as needed. 04/19/17   Darci Current, MD  ondansetron (ZOFRAN ODT) 4 MG disintegrating tablet Take 1 tablet (4 mg total) by mouth every 6 (six) hours as needed for nausea or vomiting. 04/16/17   Sharyn CreamerQuale, Mark, MD  oxyCODONE-acetaminophen (ROXICET) 5-325 MG tablet Take 1 tablet by mouth every 6 (six) hours as needed for severe pain. Patient not taking: Reported on 04/19/2017 04/16/17   Sharyn CreamerQuale, Mark, MD  QUEtiapine (SEROQUEL) 200 MG tablet Take 200 mg by mouth daily. 04/15/17   [provider]  valACYclovir (VALTREX) 500 MG tablet Take 1 tablet (500 mg total) by mouth daily.  01/01/17   Doreene Burkehompson, Annie, CNM  zolpidem (AMBIEN) 10 MG tablet Take 1 tablet by mouth at bedtime as needed.  03/27/16   [provider]    Allergies Wellbutrin [bupropion hcl]; Augmentin [amoxicillin-pot clavulanate]; Lasix [furosemide]; and Nitrofurantoin monohyd macro  Family History  Problem Relation Age of Onset  . Hypertension Father   . Sleep apnea Mother   . Breast cancer Paternal Grandmother   . Diabetes Maternal Grandmother   . Diabetes Maternal Aunt   . Diabetes Maternal Uncle     Social History Social History  Substance Use Topics  . Smoking status: Current Every Day Smoker    Packs/day: 1.00    Types: Cigarettes    Start date: 05/18/1995  . Smokeless tobacco: Never Used  . Alcohol use 0.0 oz/week     Comment: Socially- seldom    Review of Systems Constitutional: No fever/chills Eyes: No visual changes. ENT: No sore throat. Cardiovascular: Denies chest pain. Respiratory: Denies shortness of breath. Gastrointestinal: No abdominal pain. positive for left flank pain and nauseaGenitourinary: Negative for dysuria. Musculoskeletal: Negative for neck pain.  Negative for back pain. Integumentary: Negative for rash. Neurological: Negative for headaches, focal weakness or numbness.   ____________________________________________   PHYSICAL EXAM:  VITAL SIGNS: ED Triage Vitals  Enc Vitals Group     BP 04/19/17 0535 (!) 133/98     Pulse Rate 04/19/17 0535 (!) 103     Resp 04/19/17 0535 20     Temp 04/19/17 0535 98.2 F (36.8 C)     Temp Source 04/19/17 0535 Oral     SpO2 04/19/17 0535 100 %     Weight 04/19/17 0534 63.5 kg (140 lb)     Height --      Head Circumference --      Peak Flow --      Pain Score 04/19/17 0533 9     Pain Loc --      Pain Edu? --      Excl. in GC? --     Constitutional: Alert and oriented. apparent discomfort Eyes: Conjunctivae are normal. PERRL. EOMI. Head: Atraumatic. Mouth/Throat: Mucous membranes are moist.   Oropharynx non-erythematous. Neck: No stridor.  Cardiovascular: Normal rate, regular rhythm. Good peripheral circulation. Grossly normal heart sounds. Respiratory: Normal respiratory effort.  No retractions. Lungs CTAB. Gastrointestinal: Soft and nontender. No distention.  Musculoskeletal: No lower extremity tenderness nor edema. No gross deformities of extremities. Neurologic:  Normal speech and language. No gross focal neurologic deficits are appreciated.  Skin:  Skin is warm, dry and intact. No rash noted. Psychiatric: Mood and affect are normal. Speech and behavior are normal.  ____________________________________________   LABS (all labs ordered are listed, but only abnormal results are displayed)  Labs Reviewed  CBC - Abnormal; Notable for the following:       Result Value   WBC 13.3 (*)    All other components within normal limits  COMPREHENSIVE  METABOLIC PANEL - Abnormal; Notable for the following:    Creatinine, Ser 1.41 (*)    ALT 12 (*)    GFR calc non Af Amer 46 (*)    GFR calc Af Amer 53 (*)    Anion gap 4 (*)    All other components within normal limits  URINE CULTURE  URINALYSIS, COMPLETE (UACMP) WITH MICROSCOPIC     RADIOLOGY I, [place] Charisa Twitty, personally viewed and evaluated these images (plain radiographs) as part of my medical decision making, as well as reviewing the written report by the radiologist.  Dg Abdomen 1 View  Result Date: 04/19/2017 CLINICAL DATA:  Three-day history of left flank pain. Known distal left ureteral calculus. EXAM: ABDOMEN - 1 VIEW COMPARISON:  CT abdomen pelvis 04/16/2017, 01/21/2017, 09/09/2016, 04/12/2016 and earlier. FINDINGS: The left distal ureteral calculus identified on the CT 3 days ago is unchanged in appearance, projecting at the expected location of the left UVJ, just inferior to 2 adjacent phleboliths. No other opaque urinary tract calculi are identified. Bowel gas pattern unremarkable. Moderate ascending colonic stool  burden. Regional skeleton intact. IMPRESSION: Left distal ureteral calculus at the expected location of the UVJ, unchanged since the CT 3 days ago. No new abnormalities. Electronically Signed   By: Hulan Saas M.D.   On: 04/19/2017 07:45     Procedures   ____________________________________________   INITIAL IMPRESSION / ASSESSMENT AND PLAN / ED COURSE  As part of my medical decision making, I reviewed the following data within the electronic MEDICAL RECORD NUMBER 41 year old female presenting with history of physical exam consistent with left ureterolithiasis.patient received 4 mg of IV morphine as well as Zofran 4 mg. Patient states current pain score 7 out of 10. Will obtain a KUBand awaiting urinalysis results. Patient's care transferred to Dr.Veronese     ____________________________________________  FINAL CLINICAL IMPRESSION(S) / ED DIAGNOSES  Final diagnoses:  Kidney stone on left side     MEDICATIONS GIVEN DURING THIS VISIT:  Medications  morphine 4 MG/ML injection 4 mg (4 mg Intravenous Given 04/19/17 0546)  ondansetron (ZOFRAN) injection 4 mg (4 mg Intravenous Given 04/19/17 0546)  sodium chloride 0.9 % bolus 1,000 mL (1,000 mLs Intravenous New Bag/Given 04/19/17 0546)  ketorolac (TORADOL) 30 MG/ML injection 30 mg (30 mg Intravenous Given 04/19/17 0613)     NEW OUTPATIENT MEDICATIONS STARTED DURING THIS VISIT:  New Prescriptions   KETOROLAC (TORADOL) 10 MG TABLET    Take 1 tablet (10 mg total) by mouth every 6 (six) hours as needed.    Modified Medications   No medications on file    Discontinued Medications   IBUPROFEN (ADVIL,MOTRIN) 200 MG TABLET    Take 200 mg by mouth every 6 (six) hours as needed.     Note:  This document was prepared using Dragon voice recognition software and may include unintentional dictation errors.    Darci Current, MD 04/19/17 [phone]

## 2017-04-19 NOTE — Anesthesia Preprocedure Evaluation (Signed)
Anesthesia Evaluation  Patient identified by MRN, date of birth, ID band Patient awake    Reviewed: Allergy & Precautions, H&P , NPO status , Patient's Chart, lab work & pertinent test results, reviewed documented beta blocker date and time   History of Anesthesia Complications Negative for: history of anesthetic complications  Airway Mallampati: II  TM Distance: >3 FB Neck ROM: full    Dental  (+) Teeth Intact, Caps, Dental Advidsory Given   Pulmonary neg shortness of breath, neg COPD, neg recent URI, Current Smoker,           Cardiovascular Exercise Tolerance: Good (-) hypertension(-) angina(-) CAD, (-) Past MI, (-) Cardiac Stents and (-) CABG (-) dysrhythmias + Valvular Problems/Murmurs      Neuro/Psych PSYCHIATRIC DISORDERS (Bipolar, PTSD, anxiety, fibromyalgia) negative neurological ROS     GI/Hepatic Neg liver ROS, GERD  ,  Endo/Other  negative endocrine ROS  Renal/GU Renal disease (kidney stones)  negative genitourinary   Musculoskeletal   Abdominal   Peds  Hematology negative hematology ROS (+)   Anesthesia Other Findings Past Medical History: No date: Abnormal vaginal Pap smear     Comment:  10+ years ago- no colpo repeat was normal No date: Anxiety No date: AR (allergic rhinitis) No date: Bipolar affective (HCC)     Comment:  pt reported No date: Bipolar disorder (HCC) No date: Eating disorder     Comment:  Under control per patient No date: Fibromyalgia No date: GERD (gastroesophageal reflux disease) No date: Kidney stone No date: Painful intercourse No date: Painful menstrual periods No date: Pelvic pain in female No date: PTSD (post-traumatic stress disorder) No date: Renal disorder No date: Spinal stenosis No date: Uterine polyp No date: VWD (acquired von Willebrand's disease) (HCC)   Reproductive/Obstetrics negative OB ROS                             Anesthesia  Physical Anesthesia Plan  ASA: II  Anesthesia Plan: General   Post-op Pain Management:    Induction: Intravenous, Rapid sequence and Cricoid pressure planned  PONV Risk Score and Plan: 2 and Ondansetron and Dexamethasone  Airway Management Planned: Oral ETT  Additional Equipment:   Intra-op Plan:   Post-operative Plan: Extubation in OR  Informed Consent: I have reviewed the patients History and Physical, chart, labs and discussed the procedure including the risks, benefits and alternatives for the proposed anesthesia with the patient or authorized representative who has indicated his/her understanding and acceptance.   Dental Advisory Given  Plan Discussed with: Anesthesiologist, CRNA and Surgeon  Anesthesia Plan Comments:         Anesthesia Quick Evaluation

## 2017-04-19 NOTE — Consult Note (Signed)
H&P Physician requesting consult: Dr Juliene PinaMody  Chief Complaint: Left flank pain  History of Present Illness: 41 year old female recently presented with a left distal ureteral calculus was discharged for medical expulsive therapy.  She presented today with irretractable pain nausea and vomiting.  Creatinine was 1.4 and white blood cell count was 13.  Urinalysis was not consistent with infection.  Urine culture 3 days ago showed no growth.  The patient continues to have nausea and left flank pain.  Afebrile vital signs stable.  Past Medical History:  Diagnosis Date  . Abnormal vaginal Pap smear    10+ years ago- no colpo repeat was normal  . Anxiety   . AR (allergic rhinitis)   . Bipolar affective (HCC)    pt reported  . Bipolar disorder (HCC)   . Eating disorder    Under control per patient  . Fibromyalgia   . GERD (gastroesophageal reflux disease)   . Kidney stone   . Painful intercourse   . Painful menstrual periods   . Pelvic pain in female   . PTSD (post-traumatic stress disorder)   . Renal disorder   . Spinal stenosis   . Uterine polyp   . VWD (acquired von Willebrand's disease) [facility](HCC)    Past Surgical History:  Procedure Laterality Date  . ABDOMINAL HYSTERECTOMY    . HYSTEROSCOPY     removed polyps  . LAPAROSCOPIC VAGINAL HYSTERECTOMY  2015   [facility]  . LAPAROSCOPY Left 01/10/2015   Procedure: LAPAROSCOPY OPERATIVE with biopsy, left oopherectomy;  Surgeon: Herold HarmsMartin A Defrancesco, MD;  Location: [facility] ORS;  Service: Gynecology;  Laterality: Left;  . LAPAROSCOPY ABDOMEN DIAGNOSTIC    . TUBAL LIGATION      Home Medications:  Prescriptions Prior to Admission  Medication Sig Dispense Refill Last Dose  . cephALEXin (KEFLEX) 500 MG capsule Take 1 capsule (500 mg total) by mouth 2 (two) times daily. 20 capsule 0 Past Week at 0800  . fluconazole (DIFLUCAN) 100 MG tablet Take 1 tablet (100 mg total) by mouth daily. Take 1 tablet, then if persistent symptoms take a second tablet  in 3 days 2 tablet 0 Past Week at 0800  . traZODone (DESYREL) 100 MG tablet Take 200 mg by mouth at bedtime.    04/18/2017 at 2100  . ALPRAZolam (XANAX) 1 MG tablet Take by mouth 2 (two) times daily as needed.    prn at prn  . ondansetron (ZOFRAN ODT) 4 MG disintegrating tablet Take 1 tablet (4 mg total) by mouth every 6 (six) hours as needed for nausea or vomiting. 20 tablet 0 prn at prn  . oxyCODONE-acetaminophen (ROXICET) 5-325 MG tablet Take 1 tablet by mouth every 6 (six) hours as needed for severe pain. (Patient not taking: Reported on 04/19/2017) 12 tablet 0 Completed Course at Unknown time  . QUEtiapine (SEROQUEL) 200 MG tablet Take 200 mg by mouth daily.  2 Not Taking at Unknown time  . valACYclovir (VALTREX) 500 MG tablet Take 1 tablet (500 mg total) by mouth daily. 30 tablet 5 prn at prn  . zolpidem (AMBIEN) 10 MG tablet Take 1 tablet by mouth at bedtime as needed.    prn at prn   Allergies:  Allergies  Allergen Reactions  . Wellbutrin [Bupropion Hcl] Other (See Comments)    Reaction:  Suicidal   . Augmentin [Amoxicillin-Pot Clavulanate] Diarrhea, Nausea And Vomiting and Other (See Comments)  . Lasix [Furosemide] Rash  . Nitrofurantoin Monohyd Macro Rash    Family History  Problem  Relation Age of Onset  . Hypertension Father   . Sleep apnea Mother   . Breast cancer Paternal Grandmother   . Diabetes Maternal Grandmother   . Breast cancer Maternal Grandmother   . Diabetes Maternal Aunt   . Diabetes Maternal Uncle    Social History:  reports that she has been smoking Cigarettes.  She started smoking about 21 years ago. She has been smoking about 1.00 pack per day. She has never used smokeless tobacco. She reports that she does not drink alcohol or use drugs.  ROS: A complete review of systems was performed.  All systems are negative except for pertinent findings as noted. ROS   Physical Exam:  Vital signs in last 24 hours: Temp:  [97.8 F (36.6 C)-98.2 F (36.8 C)]  97.8 F (36.6 C) (10/26 1243) Pulse Rate:  [74-103] 75 (10/26 1237) Resp:  [16-20] 16 (10/26 1237) BP: (105-133)/(52-98) 111/52 (10/26 1237) SpO2:  [96 %-100 %] 99 % (10/26 1222) Weight:  [63.5 kg (140 lb)-73.7 kg (162 lb 8 oz)] 73.7 kg (162 lb 8 oz) (10/26 1243) General:  Alert and oriented, No acute distress, appears uncomfortable HEENT: Normocephalic, atraumatic Neck: No JVD or lymphadenopathy Cardiovascular: Regular rate and rhythm Lungs: Regular rate and effort Abdomen: Soft, nontender, nondistended, no abdominal masses Back: Mild left CVA tenderness Extremities: No edema Neurologic: Grossly intact  Laboratory Data:  Results for orders placed or performed during the hospital encounter of 04/19/17 (from the past 24 hour(s))  Urinalysis, Complete w Microscopic     Status: Abnormal   Collection Time: 04/19/17  5:37 AM  Result Value Ref Range   Color, Urine YELLOW (A) YELLOW   APPearance HAZY (A) CLEAR   Specific Gravity, Urine 1.019 1.005 - 1.030   pH 5.0 5.0 - 8.0   Glucose, UA NEGATIVE NEGATIVE mg/dL   Hgb urine dipstick LARGE (A) NEGATIVE   Bilirubin Urine NEGATIVE NEGATIVE   Ketones, ur NEGATIVE NEGATIVE mg/dL   Protein, ur NEGATIVE NEGATIVE mg/dL   Nitrite NEGATIVE NEGATIVE   Leukocytes, UA NEGATIVE NEGATIVE   RBC / HPF TOO NUMEROUS TO COUNT 0 - 5 RBC/hpf   WBC, UA 0-5 0 - 5 WBC/hpf   Bacteria, UA RARE (A) NONE SEEN   Squamous Epithelial / LPF 0-5 (A) NONE SEEN   Mucus PRESENT    Ca Oxalate Crys, UA PRESENT   CBC     Status: Abnormal   Collection Time: 04/19/17  5:38 AM  Result Value Ref Range   WBC 13.3 (H) 3.6 - 11.0 K/uL   RBC 4.30 3.80 - 5.20 MIL/uL   Hemoglobin 13.4 12.0 - 16.0 g/dL   HCT 40.9 81.1 - 91.4 %   MCV 93.6 80.0 - 100.0 fL   MCH 31.0 26.0 - 34.0 pg   MCHC 33.2 32.0 - 36.0 g/dL   RDW 78.2 95.6 - 21.3 %   Platelets 232 150 - 440 K/uL  Comprehensive metabolic panel     Status: Abnormal   Collection Time: 04/19/17  5:38 AM  Result Value Ref  Range   Sodium 140 135 - 145 mmol/L   Potassium 3.7 3.5 - 5.1 mmol/L   Chloride 108 101 - 111 mmol/L   CO2 28 22 - 32 mmol/L   Glucose, Bld 92 65 - 99 mg/dL   BUN 14 6 - 20 mg/dL   Creatinine, Ser 0.86 (H) 0.44 - 1.00 mg/dL   Calcium 8.9 8.9 - 57.8 mg/dL   Total Protein 6.8 6.5 - 8.1 g/dL  Albumin 3.6 3.5 - 5.0 g/dL   AST 18 15 - 41 U/L   ALT 12 (L) 14 - 54 U/L   Alkaline Phosphatase 54 38 - 126 U/L   Total Bilirubin 0.7 0.3 - 1.2 mg/dL   GFR calc non Af Amer 46 (L) >60 mL/min   GFR calc Af Amer 53 (L) >60 mL/min   Anion gap 4 (L) 5 - 15  Basic metabolic panel     Status: Abnormal   Collection Time: 04/19/17 10:23 AM  Result Value Ref Range   Sodium 139 135 - 145 mmol/L   Potassium 4.3 3.5 - 5.1 mmol/L   Chloride 108 101 - 111 mmol/L   CO2 25 22 - 32 mmol/L   Glucose, Bld 93 65 - 99 mg/dL   BUN 14 6 - 20 mg/dL   Creatinine, Ser 1.61 (H) 0.44 - 1.00 mg/dL   Calcium 8.3 (L) 8.9 - 10.3 mg/dL   GFR calc non Af Amer 47 (L) >60 mL/min   GFR calc Af Amer 54 (L) >60 mL/min   Anion gap 6 5 - 15   Recent Results (from the past 240 hour(s))  Urine Culture     Status: None   Collection Time: 04/16/17  1:10 PM  Result Value Ref Range Status   Specimen Description URINE, RANDOM  Final   Special Requests Normal  Final   Culture   Final    NO GROWTH Performed at [facility], [address], [place], [place] [address]    Report Status 04/17/2017 FINAL  Final   Creatinine:  Recent Labs  04/16/17 1310 04/19/17 0538 04/19/17 1023  CREATININE 0.89 1.41* 1.39*   CT: IMPRESSION: LEFT hydronephrosis and hydroureter secondary to a 3 mm distal LEFT ureteral calculus.  Impression/Assessment:  Left ureteral calculus with irretractable pain  Plan:  I talked to her about left ureteroscopy, laser lithotripsy, ureteral stent placement.  She understands the risk of bleeding, infection, injury to surrounding structures, possible inability to access the ureter and therefore only  placing a stent thus requiring a second procedure.  She would like to proceed.  Case posted for tonight.  Ray Church, III 04/19/2017, 4:04 PM

## 2017-04-19 NOTE — Progress Notes (Signed)
Informed by nursing that patient was requesting to leave the floor to smoke. Patient was informed by nursing staff that hospital policy prohibits the same. Nicotine patch and other nicotine replacement methods offered the patient and she refused. Patient underwent cystoscopy with ureteral stent placement and lithotripsy today. Urology stated that from their standpoint patient could be discharged tonight or in the morning, hospitalist team felt it wise to monitor the patient tonight postop. Patient subsequently requesting to be discharged tonight. Reasoning for recommended the patient states tonight for fluid administration and monitoring her renal function in the setting of AKI explained to the patient in terms of medical safety.  Kristeen MissWILLIS, Quanta Roher FIELDING [facility] 04/19/2017, 8:02 PM

## 2017-04-19 NOTE — H&P (Signed)
[facility] [place] at [facility]   PATIENT NAME: Casey Hodges    MR#:  [id number]  DATE OF BIRTH:  02-Jul-1975  DATE OF ADMISSION:  04/19/2017  PRIMARY CARE PHYSICIAN: Ellyn Hack, MD   REQUESTING/REFERRING PHYSICIAN: dr Don Perking  CHIEF COMPLAINT:   Left sided flank pain HISTORY OF PRESENT ILLNESS:  Casey Hodges  is a 41 y.o. female with a known history of bipolar affect disorder with depression who presents with above complaint. Patient was evaluated on October 23 due to left-sided flank pain. During that time she had a CT scan in the emergency room which showed a 3 mm left kidney stone with mild hydronephrosis. She was sent home with instructions to strain her urine and drink plenty of fluid. She continues to have worsening left-sided pain today. She denies fever or chills. She did have Hematuria days ago however this is now improved. Concern is that patient's creatinine has now increased. I have spoken with the urologist she will see the patient in consultation later this afternoon.  Pain is currently a 4 out of 10. She did just receive Toradol.  PAST MEDICAL HISTORY:   Past Medical History:  Diagnosis Date  . Abnormal vaginal Pap smear    10+ years ago- no colpo repeat was normal  . Anxiety   . AR (allergic rhinitis)   . Bipolar affective (HCC)    pt reported  . Bipolar disorder (HCC)   . Eating disorder    Under control per patient  . Fibromyalgia   . GERD (gastroesophageal reflux disease)   . Kidney stone   . Painful intercourse   . Painful menstrual periods   . Pelvic pain in female   . PTSD (post-traumatic stress disorder)   . Renal disorder   . Spinal stenosis   . Uterine polyp   . VWD (acquired von Willebrand's disease) (HCC)     PAST SURGICAL HISTORY:   Past Surgical History:  Procedure Laterality Date  . ABDOMINAL HYSTERECTOMY    . HYSTEROSCOPY     removed polyps  . LAPAROSCOPIC VAGINAL HYSTERECTOMY  2015   at [facility]  .  LAPAROSCOPY Left 01/10/2015   Procedure: LAPAROSCOPY OPERATIVE with biopsy, left oopherectomy;  Surgeon: Herold Harms, MD;  Location: [facility] ORS;  Service: Gynecology;  Laterality: Left;  . LAPAROSCOPY ABDOMEN DIAGNOSTIC    . TUBAL LIGATION      SOCIAL HISTORY:   Social History  Substance Use Topics  . Smoking status: Current Every Day Smoker    Packs/day: 1.00    Types: Cigarettes    Start date: 05/18/1995  . Smokeless tobacco: Never Used  . Alcohol use 0.0 oz/week     Comment: Socially- seldom    FAMILY HISTORY:   Family History  Problem Relation Age of Onset  . Hypertension Father   . Sleep apnea Mother   . Breast cancer Paternal Grandmother   . Diabetes Maternal Grandmother   . Diabetes Maternal Aunt   . Diabetes Maternal Uncle     DRUG ALLERGIES:   Allergies  Allergen Reactions  . Wellbutrin [Bupropion Hcl] Other (See Comments)    Reaction:  Suicidal   . Augmentin [Amoxicillin-Pot Clavulanate] Diarrhea, Nausea And Vomiting and Other (See Comments)  . Lasix [Furosemide] Rash  . Nitrofurantoin Monohyd Macro Rash    REVIEW OF SYSTEMS:   Review of Systems  Constitutional: Negative.  Negative for chills, fever and malaise/fatigue.  HENT: Negative.  Negative for ear discharge, ear pain,  hearing loss, nosebleeds and sore throat.   Eyes: Negative.  Negative for blurred vision and pain.  Respiratory: Negative.  Negative for cough, hemoptysis, shortness of breath and wheezing.   Cardiovascular: Negative.  Negative for chest pain, palpitations and leg swelling.  Gastrointestinal: Negative.  Negative for abdominal pain, blood in stool, diarrhea, nausea and vomiting.  Genitourinary: Positive for flank pain. Negative for dysuria.  Musculoskeletal: Negative for back pain.  Skin: Negative.   Neurological: Negative for dizziness, tremors, speech change, focal weakness, seizures and headaches.  Endo/Heme/Allergies: Negative.  Does not bruise/bleed easily.   Psychiatric/Behavioral: Negative.  Negative for depression, hallucinations and suicidal ideas.    MEDICATIONS AT HOME:   Prior to Admission medications   Medication Sig Start Date End Date Taking? Authorizing Provider  cephALEXin (KEFLEX) 500 MG capsule Take 1 capsule (500 mg total) by mouth 2 (two) times daily. 04/16/17  Yes Sharyn CreamerQuale, Mark, MD  fluconazole (DIFLUCAN) 100 MG tablet Take 1 tablet (100 mg total) by mouth daily. Take 1 tablet, then if persistent symptoms take a second tablet in 3 days 04/16/17  Yes Sharyn CreamerQuale, Mark, MD  traZODone (DESYREL) 100 MG tablet Take 200 mg by mouth at bedtime.  05/11/08  Yes [provider]  ALPRAZolam Prudy Feeler(XANAX) 1 MG tablet Take by mouth 2 (two) times daily as needed.     [provider]  ketorolac (TORADOL) 10 MG tablet Take 1 tablet (10 mg total) by mouth every 6 (six) hours as needed. 04/19/17   Darci CurrentBrown, [place] N, MD  ondansetron (ZOFRAN ODT) 4 MG disintegrating tablet Take 1 tablet (4 mg total) by mouth every 6 (six) hours as needed for nausea or vomiting. 04/16/17   Sharyn CreamerQuale, Mark, MD  oxyCODONE-acetaminophen (ROXICET) 5-325 MG tablet Take 1 tablet by mouth every 6 (six) hours as needed for severe pain. Patient not taking: Reported on 04/19/2017 04/16/17   Sharyn CreamerQuale, Mark, MD  QUEtiapine (SEROQUEL) 200 MG tablet Take 200 mg by mouth daily. 04/15/17   [provider]  valACYclovir (VALTREX) 500 MG tablet Take 1 tablet (500 mg total) by mouth daily. 01/01/17   Doreene Burkehompson, Annie, CNM  zolpidem (AMBIEN) 10 MG tablet Take 1 tablet by mouth at bedtime as needed.  03/27/16   [provider]      VITAL SIGNS:  Blood pressure 110/76, pulse 88, temperature 98.2 F (36.8 C), temperature source Oral, resp. rate 16, weight 63.5 kg (140 lb), last menstrual period 09/16/2015, SpO2 98 %.  PHYSICAL EXAMINATION:   Physical Exam  Constitutional: She is oriented to person, place, and time and well-developed, well-nourished, and in no distress. No  distress.  HENT:  Head: Normocephalic.  Eyes: No scleral icterus.  Neck: Normal range of motion. Neck supple. No JVD present. No tracheal deviation present.  Cardiovascular: Normal rate, regular rhythm and normal heart sounds.  Exam reveals no gallop and no friction rub.   No murmur heard. Pulmonary/Chest: Effort normal and breath sounds normal. No respiratory distress. She has no wheezes. She has no rales. She exhibits no tenderness.  Abdominal: Soft. Bowel sounds are normal. She exhibits no distension and no mass. There is tenderness. There is no rebound and no guarding.  Left-sided CVA tenderness  Musculoskeletal: Normal range of motion. She exhibits no edema.  Neurological: She is alert and oriented to person, place, and time.  Skin: Skin is warm. No rash noted. No erythema.  Psychiatric: Affect and judgment normal.      LABORATORY PANEL:   CBC  Recent Labs Lab  04/19/17 0538  WBC 13.3*  HGB 13.4  HCT 40.3  PLT 232   ------------------------------------------------------------------------------------------------------------------  Chemistries   Recent Labs Lab 04/19/17 0538 04/19/17 1023  NA 140 139  K 3.7 4.3  CL 108 108  CO2 28 25  GLUCOSE 92 93  BUN 14 14  CREATININE 1.41* 1.39*  CALCIUM 8.9 8.3*  AST 18  --   ALT 12*  --   ALKPHOS 54  --   BILITOT 0.7  --    ------------------------------------------------------------------------------------------------------------------  Cardiac Enzymes No results for input(s): TROPONINI in the last 168 hours. ------------------------------------------------------------------------------------------------------------------  RADIOLOGY:  Dg Abdomen 1 View  Result Date: 04/19/2017 CLINICAL DATA:  Three-day history of left flank pain. Known distal left ureteral calculus. EXAM: ABDOMEN - 1 VIEW COMPARISON:  CT abdomen pelvis 04/16/2017, 01/21/2017, 09/09/2016, 04/12/2016 and earlier. FINDINGS: The left distal ureteral  calculus identified on the CT 3 days ago is unchanged in appearance, projecting at the expected location of the left UVJ, just inferior to 2 adjacent phleboliths. No other opaque urinary tract calculi are identified. Bowel gas pattern unremarkable. Moderate ascending colonic stool burden. Regional skeleton intact. IMPRESSION: Left distal ureteral calculus at the expected location of the UVJ, unchanged since the CT 3 days ago. No new abnormalities. Electronically Signed   By: Hulan Saas M.D.   On: 04/19/2017 07:45    EKG:   none  IMPRESSION AND PLAN:   41 year old female with history of depression and bipolar affective disorder who presents with persistent left-sided flank pain.  1. Left distal ureteral calculus at the UVJ: Urology consultation requested and discussed with urologist on call. Continue IV fluids When necessary pain management and antiemetics  2. Acute kidney injury in the setting of kidney stone Avoid nephrotoxic medications Continue IV fluids BMP for a.m.  3. Bipolar and depression: Patient has been on Seroquel in the past but has not taken this for several months  4.Tobacco dependence: Patient is encouraged to quit smoking. Counseling was provided for 4 minutes.     All the records are reviewed and case discussed with ED provider. Management plans discussed with the patient and she is in agreement  CODE STATUS: full  TOTAL TIME TAKING CARE OF THIS PATIENT: 41 minutes.    Saliyah Gillin M.D on 04/19/2017 at 11:29 AM  Between 7am to 6pm - Pager - [phone]  After 6pm go to [email or web address] - Social research officer, government  [facility]  Office  [phone]  CC: Primary care physician; Ellyn Hack, MD

## 2017-04-19 NOTE — H&P (View-Only) (Signed)
H&P Physician requesting consult: Dr Mody  Chief Complaint: Left flank pain  History of Present Illness: 41-year-old female recently presented with a left distal ureteral calculus was discharged for medical expulsive therapy.  She presented today with irretractable pain nausea and vomiting.  Creatinine was 1.4 and white blood cell count was 13.  Urinalysis was not consistent with infection.  Urine culture 3 days ago showed no growth.  The patient continues to have nausea and left flank pain.  Afebrile vital signs stable.  Past Medical History:  Diagnosis Date  . Abnormal vaginal Pap smear    10+ years ago- no colpo repeat was normal  . Anxiety   . AR (allergic rhinitis)   . Bipolar affective (HCC)    pt reported  . Bipolar disorder (HCC)   . Eating disorder    Under control per patient  . Fibromyalgia   . GERD (gastroesophageal reflux disease)   . Kidney stone   . Painful intercourse   . Painful menstrual periods   . Pelvic pain in female   . PTSD (post-traumatic stress disorder)   . Renal disorder   . Spinal stenosis   . Uterine polyp   . VWD (acquired von Willebrand's disease) (HCC)    Past Surgical History:  Procedure Laterality Date  . ABDOMINAL HYSTERECTOMY    . HYSTEROSCOPY     removed polyps  . LAPAROSCOPIC VAGINAL HYSTERECTOMY  2015   at [facility]  . LAPAROSCOPY Left 01/10/2015   Procedure: LAPAROSCOPY OPERATIVE with biopsy, left oopherectomy;  Surgeon: Martin A Defrancesco, MD;  Location: [facility] ORS;  Service: Gynecology;  Laterality: Left;  . LAPAROSCOPY ABDOMEN DIAGNOSTIC    . TUBAL LIGATION      Home Medications:  Prescriptions Prior to Admission  Medication Sig Dispense Refill Last Dose  . cephALEXin (KEFLEX) 500 MG capsule Take 1 capsule (500 mg total) by mouth 2 (two) times daily. 20 capsule 0 Past Week at 0800  . fluconazole (DIFLUCAN) 100 MG tablet Take 1 tablet (100 mg total) by mouth daily. Take 1 tablet, then if persistent symptoms take a second tablet  in 3 days 2 tablet 0 Past Week at 0800  . traZODone (DESYREL) 100 MG tablet Take 200 mg by mouth at bedtime.    04/18/2017 at 2100  . ALPRAZolam (XANAX) 1 MG tablet Take by mouth 2 (two) times daily as needed.    prn at prn  . ondansetron (ZOFRAN ODT) 4 MG disintegrating tablet Take 1 tablet (4 mg total) by mouth every 6 (six) hours as needed for nausea or vomiting. 20 tablet 0 prn at prn  . oxyCODONE-acetaminophen (ROXICET) 5-325 MG tablet Take 1 tablet by mouth every 6 (six) hours as needed for severe pain. (Patient not taking: Reported on 04/19/2017) 12 tablet 0 Completed Course at Unknown time  . QUEtiapine (SEROQUEL) 200 MG tablet Take 200 mg by mouth daily.  2 Not Taking at Unknown time  . valACYclovir (VALTREX) 500 MG tablet Take 1 tablet (500 mg total) by mouth daily. 30 tablet 5 prn at prn  . zolpidem (AMBIEN) 10 MG tablet Take 1 tablet by mouth at bedtime as needed.    prn at prn   Allergies:  Allergies  Allergen Reactions  . Wellbutrin [Bupropion Hcl] Other (See Comments)    Reaction:  Suicidal   . Augmentin [Amoxicillin-Pot Clavulanate] Diarrhea, Nausea And Vomiting and Other (See Comments)  . Lasix [Furosemide] Rash  . Nitrofurantoin Monohyd Macro Rash    Family History  Problem   Relation Age of Onset  . Hypertension Father   . Sleep apnea Mother   . Breast cancer Paternal Grandmother   . Diabetes Maternal Grandmother   . Breast cancer Maternal Grandmother   . Diabetes Maternal Aunt   . Diabetes Maternal Uncle    Social History:  reports that she has been smoking Cigarettes.  She started smoking about 21 years ago. She has been smoking about 1.00 pack per day. She has never used smokeless tobacco. She reports that she does not drink alcohol or use drugs.  ROS: A complete review of systems was performed.  All systems are negative except for pertinent findings as noted. ROS   Physical Exam:  Vital signs in last 24 hours: Temp:  [97.8 F (36.6 C)-98.2 F (36.8 C)]  97.8 F (36.6 C) (10/26 1243) Pulse Rate:  [74-103] 75 (10/26 1237) Resp:  [16-20] 16 (10/26 1237) BP: (105-133)/(52-98) 111/52 (10/26 1237) SpO2:  [96 %-100 %] 99 % (10/26 1222) Weight:  [63.5 kg (140 lb)-73.7 kg (162 lb 8 oz)] 73.7 kg (162 lb 8 oz) (10/26 1243) General:  Alert and oriented, No acute distress, appears uncomfortable HEENT: Normocephalic, atraumatic Neck: No JVD or lymphadenopathy Cardiovascular: Regular rate and rhythm Lungs: Regular rate and effort Abdomen: Soft, nontender, nondistended, no abdominal masses Back: Mild left CVA tenderness Extremities: No edema Neurologic: Grossly intact  Laboratory Data:  Results for orders placed or performed during the hospital encounter of 04/19/17 (from the past 24 hour(s))  Urinalysis, Complete w Microscopic     Status: Abnormal   Collection Time: 04/19/17  5:37 AM  Result Value Ref Range   Color, Urine YELLOW (A) YELLOW   APPearance HAZY (A) CLEAR   Specific Gravity, Urine 1.019 1.005 - 1.030   pH 5.0 5.0 - 8.0   Glucose, UA NEGATIVE NEGATIVE mg/dL   Hgb urine dipstick LARGE (A) NEGATIVE   Bilirubin Urine NEGATIVE NEGATIVE   Ketones, ur NEGATIVE NEGATIVE mg/dL   Protein, ur NEGATIVE NEGATIVE mg/dL   Nitrite NEGATIVE NEGATIVE   Leukocytes, UA NEGATIVE NEGATIVE   RBC / HPF TOO NUMEROUS TO COUNT 0 - 5 RBC/hpf   WBC, UA 0-5 0 - 5 WBC/hpf   Bacteria, UA RARE (A) NONE SEEN   Squamous Epithelial / LPF 0-5 (A) NONE SEEN   Mucus PRESENT    Ca Oxalate Crys, UA PRESENT   CBC     Status: Abnormal   Collection Time: 04/19/17  5:38 AM  Result Value Ref Range   WBC 13.3 (H) 3.6 - 11.0 K/uL   RBC 4.30 3.80 - 5.20 MIL/uL   Hemoglobin 13.4 12.0 - 16.0 g/dL   HCT 40.3 35.0 - 47.0 %   MCV 93.6 80.0 - 100.0 fL   MCH 31.0 26.0 - 34.0 pg   MCHC 33.2 32.0 - 36.0 g/dL   RDW 12.7 11.5 - 14.5 %   Platelets 232 150 - 440 K/uL  Comprehensive metabolic panel     Status: Abnormal   Collection Time: 04/19/17  5:38 AM  Result Value Ref  Range   Sodium 140 135 - 145 mmol/L   Potassium 3.7 3.5 - 5.1 mmol/L   Chloride 108 101 - 111 mmol/L   CO2 28 22 - 32 mmol/L   Glucose, Bld 92 65 - 99 mg/dL   BUN 14 6 - 20 mg/dL   Creatinine, Ser 1.41 (H) 0.44 - 1.00 mg/dL   Calcium 8.9 8.9 - 10.3 mg/dL   Total Protein 6.8 6.5 - 8.1 g/dL     Albumin 3.6 3.5 - 5.0 g/dL   AST 18 15 - 41 U/L   ALT 12 (L) 14 - 54 U/L   Alkaline Phosphatase 54 38 - 126 U/L   Total Bilirubin 0.7 0.3 - 1.2 mg/dL   GFR calc non Af Amer 46 (L) >60 mL/min   GFR calc Af Amer 53 (L) >60 mL/min   Anion gap 4 (L) 5 - 15  Basic metabolic panel     Status: Abnormal   Collection Time: 04/19/17 10:23 AM  Result Value Ref Range   Sodium 139 135 - 145 mmol/L   Potassium 4.3 3.5 - 5.1 mmol/L   Chloride 108 101 - 111 mmol/L   CO2 25 22 - 32 mmol/L   Glucose, Bld 93 65 - 99 mg/dL   BUN 14 6 - 20 mg/dL   Creatinine, Ser 1.39 (H) 0.44 - 1.00 mg/dL   Calcium 8.3 (L) 8.9 - 10.3 mg/dL   GFR calc non Af Amer 47 (L) >60 mL/min   GFR calc Af Amer 54 (L) >60 mL/min   Anion gap 6 5 - 15   Recent Results (from the past 240 hour(s))  Urine Culture     Status: None   Collection Time: 04/16/17  1:10 PM  Result Value Ref Range Status   Specimen Description URINE, RANDOM  Final   Special Requests Normal  Final   Culture   Final    NO GROWTH Performed at [facility], [address] [place], [place] [address]    Report Status 04/17/2017 FINAL  Final   Creatinine:  Recent Labs  04/16/17 1310 04/19/17 0538 04/19/17 1023  CREATININE 0.89 1.41* 1.39*   CT: IMPRESSION: LEFT hydronephrosis and hydroureter secondary to a 3 mm distal LEFT ureteral calculus.  Impression/Assessment:  Left ureteral calculus with irretractable pain  Plan:  I talked to her about left ureteroscopy, laser lithotripsy, ureteral stent placement.  She understands the risk of bleeding, infection, injury to surrounding structures, possible inability to access the ureter and therefore only  placing a stent thus requiring a second procedure.  She would like to proceed.  Case posted for tonight.  Savaughn Karwowski D Starlyn Droge, III 04/19/2017, 4:04 PM   

## 2017-04-19 NOTE — Transfer of Care (Signed)
Immediate Anesthesia Transfer of Care Note  Patient: Casey Hodges  Procedure(s) Performed: URETEROSCOPY WITH HOLMIUM LASER LITHOTRIPSY (Left ) CYSTOSCOPY WITH STENT PLACEMENT (Left )  Patient Location: PACU  Anesthesia Type:General  Level of Consciousness: awake  Airway & Oxygen Therapy: Patient connected to face mask oxygen  Post-op Assessment: Post -op Vital signs reviewed and stable  Post vital signs: stable  Last Vitals:  Vitals:   04/19/17 1243 04/19/17 1820  BP:  119/67  Pulse:  100  Resp:  19  Temp: 36.6 C   SpO2:  100%    Last Pain:  Vitals:   04/19/17 1523  TempSrc:   PainSc: Asleep         Complications: No apparent anesthesia complications

## 2017-04-19 NOTE — Discharge Instructions (Signed)

## 2017-04-19 NOTE — ED Provider Notes (Signed)
-----------------------------------------   11:02 AM on 04/19/2017 -----------------------------------------   Blood pressure 110/76, pulse 88, temperature 98.2 F (36.8 C), temperature source Oral, resp. rate 16, weight 63.5 kg (140 lb), last menstrual period 09/16/2015, SpO2 98 %.  Assuming care from Dr. Manson PasseyBrown of Jacqulynn CadetKena C Calica is a 41 y.o. female with a chief complaint of Flank Pain (left) .    Please refer to H&P by previous MD for further details.  Patient was diagnosed with a kidney stone 3 days ago and presented again today with pain. She was found to have acute kidney injury that did not respond to IV fluids. She does have leukocytosis however her UA from today shows no evidence of infection and urine culture from 3 days ago was negative. Patient's pain continues to be difficult to manage. Discussed with Dr. Alvester MorinBell, urology who recommended admission for pain control, IV hydration and NPO for possible stenting if patient does not improve. Discussed with Dr. Juliene PinaMody for admission.       Nita SickleVeronese, Lavelle, MD 04/19/17 [phone]

## 2017-04-19 NOTE — Progress Notes (Signed)
Spoke with Dr.Willis via phone about patient request to be discharged. Patient asked to be discharged home due to she wanted to smoke and be in her own bed. Patient was explained the risks of being discharged too soon. Patient was encouraged by staff and her father who is at bedside to stay until the morning. Patient verbalizes "she just wants to go home". Dr. Anne HahnWillis verbalize he would not discharge patient tonight, she would have to leave AMA if she doesn't want to stay. Casey Junglingonyers,Casey Hodges M

## 2017-04-19 NOTE — ED Triage Notes (Signed)
Patient c/o left flank pain. Patient was seen in this ED 2 days ago for same and dx with kidney stone. Patient has hx of kidney stones.

## 2017-04-19 NOTE — Anesthesia Procedure Notes (Signed)
Procedure Name: Intubation Date/Time: 04/19/2017 5:29 PM Performed by: Aline Brochure Pre-anesthesia Checklist: Patient identified, Emergency Drugs available, Suction available and Patient being monitored Patient Re-evaluated:Patient Re-evaluated prior to induction Oxygen Delivery Method: Circle system utilized Preoxygenation: Pre-oxygenation with 100% oxygen Induction Type: IV induction, Rapid sequence and Cricoid Pressure applied Laryngoscope Size: Mac and 3 Grade View: Grade II Tube type: Oral Tube size: 7.0 mm Number of attempts: 1 Airway Equipment and Method: Stylet Placement Confirmation: ETT inserted through vocal cords under direct vision,  positive ETCO2 and breath sounds checked- equal and bilateral Secured at: 21 cm Tube secured with: Tape Dental Injury: Teeth and Oropharynx as per pre-operative assessment

## 2017-04-19 NOTE — Progress Notes (Signed)
Dr.Willis verbalized that patient may have her prescriptions that the urologist had prescribed. Patient left AMA with her father at her side. Prescriptions given to patient. Anselm Junglingonyers,Jay Kempe M

## 2017-04-19 NOTE — Anesthesia Postprocedure Evaluation (Signed)
Anesthesia Post Note  Patient: Casey CadetKena C Carpenito  Procedure(s) Performed: URETEROSCOPY WITH HOLMIUM LASER LITHOTRIPSY (Left ) CYSTOSCOPY WITH STENT PLACEMENT (Left )  Patient location during evaluation: PACU Anesthesia Type: General Level of consciousness: awake and alert Pain management: pain level controlled Vital Signs Assessment: post-procedure vital signs reviewed and stable Respiratory status: spontaneous breathing, nonlabored ventilation, respiratory function stable and patient connected to nasal cannula oxygen Cardiovascular status: blood pressure returned to baseline and stable Postop Assessment: no apparent nausea or vomiting Anesthetic complications: no     Last Vitals:  Vitals:   04/19/17 1851 04/19/17 1909  BP: (!) 144/89 (!) 135/56  Pulse: 92 85  Resp: 15   Temp: 36.7 C   SpO2: 100% 100%    Last Pain:  Vitals:   04/19/17 1820  TempSrc:   PainSc: Asleep                 Lenard SimmerAndrew Joclynn Lumb

## 2017-04-19 NOTE — Anesthesia Post-op Follow-up Note (Signed)
Anesthesia QCDR form completed.        

## 2017-04-19 NOTE — Op Note (Signed)
Operative Note  Preoperative diagnosis:  1.  Left ureteral calculus  Postoperative diagnosis: 1.  Left ureteral calculus  Procedure(s): 1.  Cystoscopy with left ureteroscopy laser lithotripsy and ureteral stent placement  Surgeon: Modena SlaterEugene Josie Mesa, MD  Assistants: None  Anesthesia: General  Complications: None immediate  EBL: Minimal  Specimens: 1.  None  Drains/Catheters: 1.  6 x 24 double-J ureteral stent  Intraoperative findings: 1 normal bladder and urethra 2.  Area of narrowing in the distal urethra able to be carefully navigated with the scope.  4 mm distal ureteral calculus fragmented completely.    Indication: 41 year old female who recently presented to the emergency department with a left ureteral calculus.  She was discharged on medical expulsive therapy.  She presented today with irretractable pain.  Given that she failed trial of passage, the decision was made to proceed with the above operation.  Description of procedure:  The patient was identified and consent was obtained.  She was taken back to the operating room and placed in the supine position.  She was placed under general anesthesia.  She was converted to dorsal lithotomy.  Perioperative antibiotics were administered.  She was prepped and draped in standard sterile fashion.  Timeout was performed.  A 22 French rigid cystoscope was advanced into the urethra and into the bladder and complete cystoscopy was performed.  The right ureteral orifice was easily identified however the left was difficult to identify.  I therefore exchanged the 30 degree for a 70 degree scope.  This allowed for visualization of the left ureter and advanced a sensor wire up the left ureter and into the kidney under fluoroscopic guidance.  I then advanced a semirigid ureteroscope alongside the wire.  There was a narrow area in the distal ureter so I passed a second wire through the scope and drove the scope over the wire.  I removed this wire.  I  used the laser fiber to fragment the stone to less than 1 mm fragments.  I inspected the entire ureter with the ureteroscope and there was no significant trauma or injury.  I backloaded the wire onto the cystoscope and advanced that into the bladder.  I then placed a 6 x 24 double-J ureteral stent in a standard fashion over the wire and then withdrew the wire.  Fluoroscopy confirmed proximal placement and direct visualization confirmed a good coil within the bladder.  The bladder was drained and the scope withdrawn.  Patient tolerated procedure well she was stable postoperatively.  Plan: The patient will need to come back to the office in about 10-14 days for ureteral stent removal  I placed written prescriptions for hydrocodone, Flomax, and oxybutynin in the chart.  I also entered some discharge instructions into the chart.  From a urological standpoint the patient can go home this evening or tomorrow.

## 2017-04-19 NOTE — Interval H&P Note (Signed)
History and Physical Interval Note:  04/19/2017 4:11 PM  Casey Hodges  has presented today for surgery, with the diagnosis of ureteral stone  The various methods of treatment have been discussed with the patient and family. After consideration of risks, benefits and other options for treatment, the patient has consented to  Procedure(s): URETEROSCOPY WITH HOLMIUM LASER LITHOTRIPSY (Left) CYSTOSCOPY WITH STENT PLACEMENT (Left) as a surgical intervention .  The patient's history has been reviewed, patient examined, no change in status, stable for surgery.  I have reviewed the patient's chart and labs.  Questions were answered to the patient's satisfaction.     Ray ChurchEugene D Tejal Monroy, III

## 2017-04-20 ENCOUNTER — Encounter: Payer: Self-pay | Admitting: Urology

## 2017-04-21 ENCOUNTER — Emergency Department
Admission: EM | Admit: 2017-04-21 | Discharge: 2017-04-21 | Disposition: A | Discharge status: Home / Self Care | Payer: Medicare Other | Attending: Emergency Medicine | Admitting: Emergency Medicine

## 2017-04-21 ENCOUNTER — Emergency Department: Payer: Medicare Other

## 2017-04-21 DIAGNOSIS — Z79899 Other long term (current) drug therapy: Secondary | ICD-10-CM | POA: Diagnosis not present

## 2017-04-21 DIAGNOSIS — N200 Calculus of kidney: Secondary | ICD-10-CM | POA: Diagnosis not present

## 2017-04-21 DIAGNOSIS — R109 Unspecified abdominal pain: Secondary | ICD-10-CM | POA: Diagnosis not present

## 2017-04-21 DIAGNOSIS — N201 Calculus of ureter: Secondary | ICD-10-CM | POA: Insufficient documentation

## 2017-04-21 DIAGNOSIS — Z880 Allergy status to penicillin: Secondary | ICD-10-CM | POA: Diagnosis not present

## 2017-04-21 DIAGNOSIS — F17210 Nicotine dependence, cigarettes, uncomplicated: Secondary | ICD-10-CM | POA: Diagnosis not present

## 2017-04-21 LAB — URINALYSIS, COMPLETE (UACMP) WITH MICROSCOPIC
Bacteria, UA: NONE SEEN
Bilirubin Urine: NEGATIVE
GLUCOSE, UA: NEGATIVE mg/dL
Ketones, ur: NEGATIVE mg/dL
NITRITE: NEGATIVE
PROTEIN: 100 mg/dL — AB
SPECIFIC GRAVITY, URINE: 1.014 (ref 1.005–1.030)
pH: 5 (ref 5.0–8.0)

## 2017-04-21 LAB — CBC
HEMATOCRIT: 37.5 % (ref 35.0–47.0)
HEMOGLOBIN: 12.4 g/dL (ref 12.0–16.0)
MCH: 30.9 pg (ref 26.0–34.0)
MCHC: 33.1 g/dL (ref 32.0–36.0)
MCV: 93.2 fL (ref 80.0–100.0)
Platelets: 243 10*3/uL (ref 150–440)
RBC: 4.02 MIL/uL (ref 3.80–5.20)
RDW: 12.5 % (ref 11.5–14.5)
WBC: 7.8 10*3/uL (ref 3.6–11.0)

## 2017-04-21 LAB — URINE CULTURE: Culture: <10000 — AB

## 2017-04-21 LAB — BASIC METABOLIC PANEL
ANION GAP: 7 (ref 5–15)
BUN: 13 mg/dL (ref 6–20)
CHLORIDE: 108 mmol/L (ref 101–111)
CO2: 25 mmol/L (ref 22–32)
Calcium: 8.7 mg/dL — ABNORMAL LOW (ref 8.9–10.3)
Creatinine, Ser: 0.93 mg/dL (ref 0.44–1.00)
GFR calc Af Amer: >60 mL/min (ref >60)
GLUCOSE: 89 mg/dL (ref 65–99)
POTASSIUM: 4.6 mmol/L (ref 3.5–5.1)
SODIUM: 140 mmol/L (ref 135–145)

## 2017-04-21 MED ORDER — MORPHINE SULFATE (PF) 4 MG/ML IV SOLN
4.0000 mg | Freq: Once | INTRAVENOUS | Status: AC
  Administered 2017-04-21: 4 mg via INTRAVENOUS
  Filled 2017-04-21: qty 1

## 2017-04-21 MED ORDER — SODIUM CHLORIDE 0.9 % IV BOLUS (SEPSIS)
1000.0000 mL | Freq: Once | INTRAVENOUS | Status: AC
  Administered 2017-04-21: 1000 mL via INTRAVENOUS

## 2017-04-21 MED ORDER — DOCUSATE SODIUM 100 MG PO CAPS: 100.0000 mg | ORAL_CAPSULE | Freq: Two times a day (BID) | ORAL | 0 refills | Status: AC

## 2017-04-21 MED ORDER — ONDANSETRON HCL 4 MG/2ML IJ SOLN
4.0000 mg | Freq: Once | INTRAMUSCULAR | Status: AC
  Administered 2017-04-21: 4 mg via INTRAVENOUS
  Filled 2017-04-21: qty 2

## 2017-04-21 MED ORDER — IBUPROFEN 400 MG PO TABS: 400.0000 mg | ORAL_TABLET | Freq: Four times a day (QID) | ORAL | 0 refills | Status: DC | PRN

## 2017-04-21 MED ORDER — OXYCODONE-ACETAMINOPHEN 5-325 MG PO TABS: 1.0000 | ORAL_TABLET | Freq: Four times a day (QID) | ORAL | 0 refills | Status: DC | PRN

## 2017-04-21 NOTE — Discharge Instructions (Signed)
Return to the ER for new, worsening, or persistent pain not relieved by the pain medications at home, fevers, weakness, vomiting or inability to tolerate your medications, or any other new or worsening symptoms that concern you.  Follow-up with the urologist as scheduled.

## 2017-04-21 NOTE — ED Triage Notes (Signed)
Pt came to ED via pov c/o "hurting everywhere." Pt reports diagnosed with kidney stone, had lithotripsy Thursday and now in excruciating pain and having to push hard to urinate.

## 2017-04-21 NOTE — ED Provider Notes (Addendum)
[facility] Emergency Department Provider Note ____________________________________________   First MD Initiated Contact with Patient 04/21/17 1448     (approximate)  I have reviewed the triage vital signs and the nursing notes.   HISTORY  Chief Complaint Flank Pain and Post-op Problem    HPI Casey Hodges is a 41 y.o. female with a recent history of ureteral stone status post lithotripsy and stenting and other past medical history as noted below who presents with bilateral flank and back pain, nausea, and malaise over the last 2 days ever since she had the lithotripsy.  Patient states that she wanted to go home after the procedure, but she never felt better, and her symptoms have persisted except now she has pain on the left flank as well.  Patient denies vomiting, fever, or lower abdominal pain.  She reports some recent diarrhea.  She reports persistent hematuria since the procedure as well.   Past Medical History:  Diagnosis Date  . Abnormal vaginal Pap smear    10+ years ago- no colpo repeat was normal  . Anxiety   . AR (allergic rhinitis)   . Bipolar affective (HCC)    pt reported  . Bipolar disorder (HCC)   . Eating disorder    Under control per patient  . Fibromyalgia   . GERD (gastroesophageal reflux disease)   . Kidney stone   . Painful intercourse   . Painful menstrual periods   . Pelvic pain in female   . PTSD (post-traumatic stress disorder)   . Renal disorder   . Spinal stenosis   . Uterine polyp   . VWD (acquired von Willebrand's disease) [facility])     Patient Active Problem List   Diagnosis Date Noted  . Acute kidney injury (HCC) 04/19/2017  . Orthostasis 08/24/2016  . Antibiotic-induced yeast infection 08/22/2016  . Exercise-induced tachycardia 08/22/2016  . Constipation due to opioid therapy 11/04/2015  . Severe episode of recurrent major depressive disorder, without psychotic features (HCC)   . GERD (gastroesophageal reflux  disease) 09/16/2015  . Smoker 07/25/2015  . Snoring 06/09/2015  . Cardiac murmur 05/25/2015  . Drug-induced nausea and vomiting 03/22/2015  . Cyst, bone 02/14/2015  . Pelvic adhesive disease 01/21/2015  . Arthritis 12/19/2014  . Kidney stone on left side 12/13/2014  . Anxiety disorder 12/13/2014  . Postural dizziness 12/13/2014  . Subcutaneous cyst 12/13/2014  . Nephrolithiasis 05/23/2012  . Chronic pelvic pain in female 05/23/2012  . Fibromyalgia 05/23/2012  . Bipolar disorder with depression (HCC) 06/05/2011    Past Surgical History:  Procedure Laterality Date  . ABDOMINAL HYSTERECTOMY    . CYSTOSCOPY WITH STENT PLACEMENT Left 04/19/2017   Procedure: CYSTOSCOPY WITH STENT PLACEMENT;  Surgeon: Crista Elliot, MD;  Location: [facility] ORS;  Service: Urology;  Laterality: Left;  . HYSTEROSCOPY     removed polyps  . LAPAROSCOPIC VAGINAL HYSTERECTOMY  2015   at [facility]  . LAPAROSCOPY Left 01/10/2015   Procedure: LAPAROSCOPY OPERATIVE with biopsy, left oopherectomy;  Surgeon: Herold Harms, MD;  Location: [facility] ORS;  Service: Gynecology;  Laterality: Left;  . LAPAROSCOPY ABDOMEN DIAGNOSTIC    . TUBAL LIGATION    . URETEROSCOPY WITH HOLMIUM LASER LITHOTRIPSY Left 04/19/2017   Procedure: URETEROSCOPY WITH HOLMIUM LASER LITHOTRIPSY;  Surgeon: Crista Elliot, MD;  Location: [facility] ORS;  Service: Urology;  Laterality: Left;    Prior to Admission medications   Medication Sig Start Date End Date Taking? Authorizing Provider  ALPRAZolam Prudy Feeler)  1 MG tablet Take by mouth 2 (two) times daily as needed.     [provider]  cephALEXin (KEFLEX) 500 MG capsule Take 1 capsule (500 mg total) by mouth 2 (two) times daily. 04/16/17   Sharyn Creamer, MD  fluconazole (DIFLUCAN) 100 MG tablet Take 1 tablet (100 mg total) by mouth daily. Take 1 tablet, then if persistent symptoms take a second tablet in 3 days 04/16/17   Sharyn Creamer, MD  ketorolac (TORADOL) 10 MG tablet Take 1 tablet  (10 mg total) by mouth every 6 (six) hours as needed. 04/19/17   Darci Current, MD  ondansetron (ZOFRAN ODT) 4 MG disintegrating tablet Take 1 tablet (4 mg total) by mouth every 6 (six) hours as needed for nausea or vomiting. 04/16/17   Sharyn Creamer, MD  oxyCODONE-acetaminophen (ROXICET) 5-325 MG tablet Take 1 tablet by mouth every 6 (six) hours as needed for severe pain. Patient not taking: Reported on 04/19/2017 04/16/17   Sharyn Creamer, MD  QUEtiapine (SEROQUEL) 200 MG tablet Take 200 mg by mouth daily. 04/15/17   [provider]  traZODone (DESYREL) 100 MG tablet Take 200 mg by mouth at bedtime.  05/11/08   [provider]  valACYclovir (VALTREX) 500 MG tablet Take 1 tablet (500 mg total) by mouth daily. 01/01/17   Doreene Burke, CNM  zolpidem (AMBIEN) 10 MG tablet Take 1 tablet by mouth at bedtime as needed.  03/27/16   [provider]    Allergies Wellbutrin [bupropion hcl]; Augmentin [amoxicillin-pot clavulanate]; Lasix [furosemide]; and Nitrofurantoin monohyd macro  Family History  Problem Relation Age of Onset  . Hypertension Father   . Sleep apnea Mother   . Breast cancer Paternal Grandmother   . Diabetes Maternal Grandmother   . Breast cancer Maternal Grandmother   . Diabetes Maternal Aunt   . Diabetes Maternal Uncle     Social History Social History  Substance Use Topics  . Smoking status: Current Every Day Smoker    Packs/day: 1.00    Types: Cigarettes    Start date: 05/18/1995  . Smokeless tobacco: Never Used  . Alcohol use No     Comment: Socially- seldom    Review of Systems  Constitutional: No fever. Eyes: No redness. ENT: No sore throat. Cardiovascular: Denies chest pain. Respiratory: Denies shortness of breath. Gastrointestinal: Positive for nausea. Genitourinary: Negative for hematuria.  Musculoskeletal: Positive for back pain. Skin: Negative for rash. Neurological: Negative for headache.     ____________________________________________   PHYSICAL EXAM:  VITAL SIGNS: ED Triage Vitals [04/21/17 1357]  Enc Vitals Group     BP 128/83     Pulse Rate 86     Resp 16     Temp 98.5 F (36.9 C)     Temp Source Oral     SpO2 98 %     Weight 140 lb (63.5 kg)     Height 5\' 2"  (1.575 m)     Head Circumference      Peak Flow      Pain Score      Pain Loc      Pain Edu?      Excl. in GC?     Constitutional: Alert and oriented. Uncomfortable appearing but in no acute distress. Eyes: Conjunctivae are normal.  Head: Atraumatic. Nose: No congestion/rhinnorhea. Mouth/Throat: Mucous membranes are moist.   Neck: Normal range of motion.  Cardiovascular: Normal rate, regular rhythm. Grossly normal heart sounds.  Good peripheral circulation. Respiratory: Normal respiratory effort.  No  retractions. Lungs CTAB. Gastrointestinal: Soft with mild bilat flank tenderness.  No lower quadrant tenderness.  Genitourinary: R mild CVA tenderness. Musculoskeletal: No lower extremity edema.  Extremities warm and well perfused.  Neurologic:  Normal speech and language. No gross focal neurologic deficits are appreciated.  Skin:  Skin is warm and dry. No rash noted. Psychiatric: Mood and affect are normal. Speech and behavior are normal.  ____________________________________________   LABS (all labs ordered are listed, but only abnormal results are displayed)  Labs Reviewed  URINALYSIS, COMPLETE (UACMP) WITH MICROSCOPIC - Abnormal; Notable for the following:       Result Value   Color, Urine YELLOW (*)    APPearance HAZY (*)    Hgb urine dipstick LARGE (*)    Protein, ur 100 (*)    Leukocytes, UA SMALL (*)    Squamous Epithelial / LPF 0-5 (*)    All other components within normal limits  BASIC METABOLIC PANEL - Abnormal; Notable for the following:    Calcium 8.7 (*)    All other components within normal limits  CBC    ____________________________________________  EKG   ____________________________________________  RADIOLOGY    ____________________________________________   PROCEDURES  Procedure(s) performed: No    Critical Care performed: No ____________________________________________   INITIAL IMPRESSION / ASSESSMENT AND PLAN / ED COURSE  Pertinent labs & imaging results that were available during my care of the patient were reviewed by me and considered in my medical decision making (see chart for details).  41 year old female with past medical history as noted above and with recent history of right ureteral stone status post lithotripsy and stenting on 1026 presents with with persistent right flank pain, as well as generalized back pain, left flank pain, and "pain all over" since the procedure, along with persistent nausea.  Review of past medical records in Epic confirms that patient had a lithotripsy and stenting on 10/26.  She had urine cultures done on the 26th which showed no significant growth.  Patient had elevated Cr prior to the procedure, but it has now normalized.  Exam, vital signs normal, patient uncomfortable not ill-appearing, and there is bilateral flank tenderness but the abdomen is benign.  Patient had a relatively high level pain prior to the procedure, and I suspect that patient's current symptoms are most likely within the realm of normal postprocedural symptoms.  Do not suspect pyelonephritis as patient had negative cultures 2 days ago.  Low suspicion for continued ureteral obstruction given the patient's creatinine has normalized.  Plan for fluids, analgesia, and reassess.  I would like to avoid giving additional unnecessary radiation exposure to this young patient, but If persistent severe pain will consider imaging.  ----------------------------------------- 6:25 PM on 04/21/2017 -----------------------------------------  She had persistent severe pain after  the first dose of analgesia, so we obtained a CT.  CT shows stent in place and no other focal acute findings.  Patient's pain is now well controlled after second dose of morphine.  No evidence of pyelo given negative cultures on 10/26, no fever or elevated WBC or nitrites on UA.   At this time, there is no indication for further ED monitoring or for urology consult.  Patient states that she is almost out of the hydrocodone, so will prescribe additional analgesics and she will follow up with the urologist this week.  Given the patient's creatinine has normalized (elevated likely due to obstruction and/or dehydration), and she has no underlying renal disease, there is no reason at this time the  patient cannot be put on NSAID in addition to the opiate analgesic, so we will start her on a moderate dose of ibuprofen.  Patient counseled on use of opiate analgesics.  Return precautions given.  She expresses understanding of discharge instructions.      ____________________________________________   FINAL CLINICAL IMPRESSION(S) / ED DIAGNOSES  Final diagnoses:  Flank pain  Ureteral stone      NEW MEDICATIONS STARTED DURING THIS VISIT:  New Prescriptions   No medications on file     Note:  This document was prepared using Dragon voice recognition software and may include unintentional dictation errors.    Dionne Bucy, MD 04/21/17 Silva Bandy    Dionne Bucy, MD 04/21/17 [phone]

## 2017-04-22 NOTE — Discharge Summary (Signed)
Patient left AMA because she could not smoke.

## 2017-04-23 ENCOUNTER — Telehealth: Payer: Self-pay | Admitting: Urology

## 2017-04-23 NOTE — Telephone Encounter (Signed)
-----   Message from Crista ElliotEugene D Bell III, MD sent at 04/20/2017  1:03 PM EDT ----- Can you please arrange for follow-up with a [place] provider in 10-14 days for a cysto/stent removal? Thank you

## 2017-04-23 NOTE — Telephone Encounter (Signed)
Done ° ° °Casey Hodges °

## 2017-04-25 DIAGNOSIS — N200 Calculus of kidney: Secondary | ICD-10-CM | POA: Diagnosis not present

## 2017-05-09 ENCOUNTER — Other Ambulatory Visit: Payer: Self-pay | Admitting: Urology

## 2017-05-09 ENCOUNTER — Encounter: Payer: Self-pay | Admitting: Urology

## 2017-05-13 DIAGNOSIS — N390 Urinary tract infection, site not specified: Secondary | ICD-10-CM | POA: Diagnosis not present

## 2017-05-22 DIAGNOSIS — F633 Trichotillomania: Secondary | ICD-10-CM | POA: Diagnosis not present

## 2017-05-22 DIAGNOSIS — F4310 Post-traumatic stress disorder, unspecified: Secondary | ICD-10-CM | POA: Diagnosis not present

## 2017-05-22 DIAGNOSIS — F3181 Bipolar II disorder: Secondary | ICD-10-CM | POA: Diagnosis not present

## 2017-06-14 DIAGNOSIS — F633 Trichotillomania: Secondary | ICD-10-CM | POA: Diagnosis not present

## 2017-06-14 DIAGNOSIS — F3181 Bipolar II disorder: Secondary | ICD-10-CM | POA: Diagnosis not present

## 2017-06-14 DIAGNOSIS — F4310 Post-traumatic stress disorder, unspecified: Secondary | ICD-10-CM | POA: Diagnosis not present

## 2017-07-01 DIAGNOSIS — F319 Bipolar disorder, unspecified: Secondary | ICD-10-CM | POA: Diagnosis not present

## 2017-07-01 DIAGNOSIS — Z79899 Other long term (current) drug therapy: Secondary | ICD-10-CM | POA: Diagnosis not present

## 2017-07-01 DIAGNOSIS — F3132 Bipolar disorder, current episode depressed, moderate: Secondary | ICD-10-CM | POA: Diagnosis not present

## 2017-07-01 DIAGNOSIS — F4310 Post-traumatic stress disorder, unspecified: Secondary | ICD-10-CM | POA: Diagnosis not present

## 2017-07-02 ENCOUNTER — Other Ambulatory Visit: Payer: Self-pay

## 2017-07-02 ENCOUNTER — Emergency Department: Payer: Medicare Other

## 2017-07-02 ENCOUNTER — Encounter: Payer: Self-pay | Admitting: *Deleted

## 2017-07-02 ENCOUNTER — Emergency Department
Admission: EM | Admit: 2017-07-02 | Discharge: 2017-07-02 | Disposition: A | Discharge status: Home / Self Care | Payer: Medicare Other | Attending: Emergency Medicine | Admitting: Emergency Medicine

## 2017-07-02 DIAGNOSIS — S99922A Unspecified injury of left foot, initial encounter: Secondary | ICD-10-CM

## 2017-07-02 DIAGNOSIS — W230XXA Caught, crushed, jammed, or pinched between moving objects, initial encounter: Secondary | ICD-10-CM | POA: Insufficient documentation

## 2017-07-02 DIAGNOSIS — S80212A Abrasion, left knee, initial encounter: Secondary | ICD-10-CM | POA: Diagnosis not present

## 2017-07-02 DIAGNOSIS — Y999 Unspecified external cause status: Secondary | ICD-10-CM | POA: Diagnosis not present

## 2017-07-02 DIAGNOSIS — Z23 Encounter for immunization: Secondary | ICD-10-CM | POA: Insufficient documentation

## 2017-07-02 DIAGNOSIS — M79662 Pain in left lower leg: Secondary | ICD-10-CM | POA: Insufficient documentation

## 2017-07-02 DIAGNOSIS — F17210 Nicotine dependence, cigarettes, uncomplicated: Secondary | ICD-10-CM | POA: Diagnosis not present

## 2017-07-02 DIAGNOSIS — Y9389 Activity, other specified: Secondary | ICD-10-CM | POA: Diagnosis not present

## 2017-07-02 DIAGNOSIS — Y9289 Other specified places as the place of occurrence of the external cause: Secondary | ICD-10-CM | POA: Diagnosis not present

## 2017-07-02 DIAGNOSIS — S90812A Abrasion, left foot, initial encounter: Secondary | ICD-10-CM | POA: Diagnosis not present

## 2017-07-02 DIAGNOSIS — Z79899 Other long term (current) drug therapy: Secondary | ICD-10-CM | POA: Diagnosis not present

## 2017-07-02 MED ORDER — HYDROCODONE-ACETAMINOPHEN 5-325 MG PO TABS: 1.0000 | ORAL_TABLET | Freq: Four times a day (QID) | ORAL | 0 refills | Status: DC | PRN

## 2017-07-02 MED ORDER — TETANUS-DIPHTH-ACELL PERTUSSIS 5-2.5-18.5 LF-MCG/0.5 IM SUSP
0.5000 mL | Freq: Once | INTRAMUSCULAR | Status: AC
  Administered 2017-07-02: 0.5 mL via INTRAMUSCULAR
  Filled 2017-07-02: qty 0.5

## 2017-07-02 MED ORDER — OXYCODONE-ACETAMINOPHEN 5-325 MG PO TABS
1.0000 | ORAL_TABLET | Freq: Once | ORAL | Status: AC
  Administered 2017-07-02: 1 via ORAL
  Filled 2017-07-02: qty 1
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
## 2017-07-03 DIAGNOSIS — S40022A Contusion of left upper arm, initial encounter: Secondary | ICD-10-CM | POA: Diagnosis not present

## 2017-07-03 DIAGNOSIS — S9032XA Contusion of left foot, initial encounter: Secondary | ICD-10-CM | POA: Diagnosis not present

## 2017-07-03 DIAGNOSIS — S80212A Abrasion, left knee, initial encounter: Secondary | ICD-10-CM | POA: Diagnosis not present

## 2017-07-03 DIAGNOSIS — S299XXA Unspecified injury of thorax, initial encounter: Secondary | ICD-10-CM | POA: Diagnosis not present

## 2017-07-03 DIAGNOSIS — S8002XA Contusion of left knee, initial encounter: Secondary | ICD-10-CM | POA: Diagnosis not present

## 2017-07-03 DIAGNOSIS — F419 Anxiety disorder, unspecified: Secondary | ICD-10-CM | POA: Diagnosis not present

## 2017-07-03 DIAGNOSIS — S99922A Unspecified injury of left foot, initial encounter: Secondary | ICD-10-CM | POA: Diagnosis not present

## 2017-07-03 DIAGNOSIS — S40011A Contusion of right shoulder, initial encounter: Secondary | ICD-10-CM | POA: Diagnosis not present

## 2017-07-03 DIAGNOSIS — S7011XA Contusion of right thigh, initial encounter: Secondary | ICD-10-CM | POA: Diagnosis not present

## 2017-07-03 DIAGNOSIS — S3993XA Unspecified injury of pelvis, initial encounter: Secondary | ICD-10-CM | POA: Diagnosis not present

## 2017-07-03 DIAGNOSIS — S79921A Unspecified injury of right thigh, initial encounter: Secondary | ICD-10-CM | POA: Diagnosis not present

## 2017-07-03 DIAGNOSIS — M797 Fibromyalgia: Secondary | ICD-10-CM | POA: Diagnosis not present

## 2017-07-03 DIAGNOSIS — F17210 Nicotine dependence, cigarettes, uncomplicated: Secondary | ICD-10-CM | POA: Diagnosis not present

## 2017-07-03 DIAGNOSIS — S8992XA Unspecified injury of left lower leg, initial encounter: Secondary | ICD-10-CM | POA: Diagnosis not present

## 2017-07-03 DIAGNOSIS — S40021A Contusion of right upper arm, initial encounter: Secondary | ICD-10-CM | POA: Diagnosis not present

## 2017-07-05 ENCOUNTER — Inpatient Hospital Stay: Payer: Self-pay | Admitting: Family Medicine

## 2017-08-09 DIAGNOSIS — F633 Trichotillomania: Secondary | ICD-10-CM | POA: Diagnosis not present

## 2017-08-09 DIAGNOSIS — F4310 Post-traumatic stress disorder, unspecified: Secondary | ICD-10-CM | POA: Diagnosis not present

## 2017-08-09 DIAGNOSIS — F3181 Bipolar II disorder: Secondary | ICD-10-CM | POA: Diagnosis not present

## 2017-08-16 DIAGNOSIS — F4310 Post-traumatic stress disorder, unspecified: Secondary | ICD-10-CM | POA: Diagnosis not present

## 2017-08-16 DIAGNOSIS — F3181 Bipolar II disorder: Secondary | ICD-10-CM | POA: Diagnosis not present

## 2017-08-16 DIAGNOSIS — F633 Trichotillomania: Secondary | ICD-10-CM | POA: Diagnosis not present

## 2017-08-30 DIAGNOSIS — F3181 Bipolar II disorder: Secondary | ICD-10-CM | POA: Diagnosis not present

## 2017-08-30 DIAGNOSIS — F4310 Post-traumatic stress disorder, unspecified: Secondary | ICD-10-CM | POA: Diagnosis not present

## 2017-08-30 DIAGNOSIS — F633 Trichotillomania: Secondary | ICD-10-CM | POA: Diagnosis not present

## 2017-09-15 DIAGNOSIS — R300 Dysuria: Secondary | ICD-10-CM | POA: Diagnosis not present

## 2017-09-15 DIAGNOSIS — J019 Acute sinusitis, unspecified: Secondary | ICD-10-CM | POA: Diagnosis not present

## 2017-09-25 DIAGNOSIS — F4310 Post-traumatic stress disorder, unspecified: Secondary | ICD-10-CM | POA: Diagnosis not present

## 2017-09-25 DIAGNOSIS — F3132 Bipolar disorder, current episode depressed, moderate: Secondary | ICD-10-CM | POA: Diagnosis not present

## 2017-09-25 DIAGNOSIS — F319 Bipolar disorder, unspecified: Secondary | ICD-10-CM | POA: Diagnosis not present

## 2017-09-25 DIAGNOSIS — Z79899 Other long term (current) drug therapy: Secondary | ICD-10-CM | POA: Diagnosis not present

## 2017-09-27 DIAGNOSIS — F4310 Post-traumatic stress disorder, unspecified: Secondary | ICD-10-CM | POA: Diagnosis not present

## 2017-09-27 DIAGNOSIS — F633 Trichotillomania: Secondary | ICD-10-CM | POA: Diagnosis not present

## 2017-09-27 DIAGNOSIS — F3181 Bipolar II disorder: Secondary | ICD-10-CM | POA: Diagnosis not present

## 2017-10-09 DIAGNOSIS — F4310 Post-traumatic stress disorder, unspecified: Secondary | ICD-10-CM | POA: Diagnosis not present

## 2017-10-09 DIAGNOSIS — F633 Trichotillomania: Secondary | ICD-10-CM | POA: Diagnosis not present

## 2017-10-09 DIAGNOSIS — F3181 Bipolar II disorder: Secondary | ICD-10-CM | POA: Diagnosis not present

## 2017-10-23 ENCOUNTER — Encounter: Payer: Self-pay | Admitting: Certified Nurse Midwife

## 2017-10-25 DIAGNOSIS — F4310 Post-traumatic stress disorder, unspecified: Secondary | ICD-10-CM | POA: Diagnosis not present

## 2017-10-25 DIAGNOSIS — F319 Bipolar disorder, unspecified: Secondary | ICD-10-CM | POA: Diagnosis not present

## 2017-10-25 DIAGNOSIS — F3132 Bipolar disorder, current episode depressed, moderate: Secondary | ICD-10-CM | POA: Diagnosis not present

## 2017-10-30 DIAGNOSIS — F633 Trichotillomania: Secondary | ICD-10-CM | POA: Diagnosis not present

## 2017-10-30 DIAGNOSIS — F4310 Post-traumatic stress disorder, unspecified: Secondary | ICD-10-CM | POA: Diagnosis not present

## 2017-10-30 DIAGNOSIS — F3181 Bipolar II disorder: Secondary | ICD-10-CM | POA: Diagnosis not present

## 2017-11-08 DIAGNOSIS — F3181 Bipolar II disorder: Secondary | ICD-10-CM | POA: Diagnosis not present

## 2017-11-08 DIAGNOSIS — F633 Trichotillomania: Secondary | ICD-10-CM | POA: Diagnosis not present

## 2017-11-08 DIAGNOSIS — F4310 Post-traumatic stress disorder, unspecified: Secondary | ICD-10-CM | POA: Diagnosis not present

## 2017-11-11 ENCOUNTER — Encounter: Payer: Self-pay | Admitting: Certified Nurse Midwife

## 2017-11-13 ENCOUNTER — Encounter: Payer: Self-pay | Admitting: Certified Nurse Midwife

## 2017-11-13 ENCOUNTER — Ambulatory Visit (INDEPENDENT_AMBULATORY_CARE_PROVIDER_SITE_OTHER): Payer: Medicare Other | Admitting: Certified Nurse Midwife

## 2017-11-13 VITALS — BP 106/72 | HR 90 | Ht 62.0 in | Wt 146.1 lb

## 2017-11-13 DIAGNOSIS — Z1231 Encounter for screening mammogram for malignant neoplasm of breast: Secondary | ICD-10-CM | POA: Diagnosis not present

## 2017-11-13 DIAGNOSIS — Z1239 Encounter for other screening for malignant neoplasm of breast: Secondary | ICD-10-CM

## 2017-11-13 MED ORDER — OSPEMIFENE 60 MG PO TABS: 1.0000 | ORAL_TABLET | Freq: Every day | ORAL | 1 refills | Status: DC

## 2017-11-13 MED ORDER — VALACYCLOVIR HCL 500 MG PO TABS: 500.0000 mg | ORAL_TABLET | Freq: Every day | ORAL | 11 refills | Status: DC

## 2017-11-13 NOTE — Patient Instructions (Signed)
Preventive Care 40-64 Years, Female Preventive care refers to lifestyle choices and visits with your health care provider that can promote health and wellness. What does preventive care include?  A yearly physical exam. This is also called an annual well check.  Dental exams once or twice a year.  Routine eye exams. Ask your health care provider how often you should have your eyes checked.  Personal lifestyle choices, including: ? Daily care of your teeth and gums. ? Regular physical activity. ? Eating a healthy diet. ? Avoiding tobacco and drug use. ? Limiting alcohol use. ? Practicing safe sex. ? Taking low-dose aspirin daily starting at age 58. ? Taking vitamin and mineral supplements as recommended by your health care provider. What happens during an annual well check? The services and screenings done by your health care provider during your annual well check will depend on your age, overall health, lifestyle risk factors, and family history of disease. Counseling Your health care provider may ask you questions about your:  Alcohol use.  Tobacco use.  Drug use.  Emotional well-being.  Home and relationship well-being.  Sexual activity.  Eating habits.  Work and work Statistician.  Method of birth control.  Menstrual cycle.  Pregnancy history.  Screening You may have the following tests or measurements:  Height, weight, and BMI.  Blood pressure.  Lipid and cholesterol levels. These may be checked every 5 years, or more frequently if you are over 81 years old.  Skin check.  Lung cancer screening. You may have this screening every year starting at age 78 if you have a 30-pack-year history of smoking and currently smoke or have quit within the past 15 years.  Fecal occult blood test (FOBT) of the stool. You may have this test every year starting at age 65.  Flexible sigmoidoscopy or colonoscopy. You may have a sigmoidoscopy every 5 years or a colonoscopy  every 10 years starting at age 30.  Hepatitis C blood test.  Hepatitis B blood test.  Sexually transmitted disease (STD) testing.  Diabetes screening. This is done by checking your blood sugar (glucose) after you have not eaten for a while (fasting). You may have this done every 1-3 years.  Mammogram. This may be done every 1-2 years. Talk to your health care provider about when you should start having regular mammograms. This may depend on whether you have a family history of breast cancer.  BRCA-related cancer screening. This may be done if you have a family history of breast, ovarian, tubal, or peritoneal cancers.  Pelvic exam and Pap test. This may be done every 3 years starting at age 80. Starting at age 36, this may be done every 5 years if you have a Pap test in combination with an HPV test.  Bone density scan. This is done to screen for osteoporosis. You may have this scan if you are at high risk for osteoporosis.  Discuss your test results, treatment options, and if necessary, the need for more tests with your health care provider. Vaccines Your health care provider may recommend certain vaccines, such as:  Influenza vaccine. This is recommended every year.  Tetanus, diphtheria, and acellular pertussis (Tdap, Td) vaccine. You may need a Td booster every 10 years.  Varicella vaccine. You may need this if you have not been vaccinated.  Zoster vaccine. You may need this after age 5.  Measles, mumps, and rubella (MMR) vaccine. You may need at least one dose of MMR if you were born in  1956 or later. You may also need a second dose.  Pneumococcal 13-valent conjugate (PCV13) vaccine. You may need this if you have certain conditions and were not previously vaccinated.  Pneumococcal polysaccharide (PPSV23) vaccine. You may need one or two doses if you smoke cigarettes or if you have certain conditions.  Meningococcal vaccine. You may need this if you have certain  conditions.  Hepatitis A vaccine. You may need this if you have certain conditions or if you travel or work in places where you may be exposed to hepatitis A.  Hepatitis B vaccine. You may need this if you have certain conditions or if you travel or work in places where you may be exposed to hepatitis B.  Haemophilus influenzae type b (Hib) vaccine. You may need this if you have certain conditions.  Talk to your health care provider about which screenings and vaccines you need and how often you need them. This information is not intended to replace advice given to you by your health care provider. Make sure you discuss any questions you have with your health care provider. Document Released: 07/08/2015 Document Revised: 02/29/2016 Document Reviewed: 04/12/2015 Elsevier Interactive Patient Education  2018 Elsevier Inc.  

## 2017-11-13 NOTE — Progress Notes (Signed)
Pt is here for a yearly exam.

## 2017-11-13 NOTE — Progress Notes (Signed)
GYNECOLOGY ANNUAL PREVENTATIVE CARE ENCOUNTER NOTE  Subjective:   Casey Hodges is a 42 y.o. G58P1001 female here for a routine annual gynecologic exam.  Current complaints: pain with intercourse.   Denies abnormal vaginal bleeding, discharge, pelvic pain, problems with intercourse or other gynecologic concerns.    Gynecologic History Patient's last menstrual period was 09/16/2015. Contraception: complete hystorectomy Last Pap:n/a .  Last mammogram: Did not do last year, pt state she was to nervous.   Obstetric History OB History  Gravida Para Term Preterm AB Living  SAB TAB Ectopic Multiple Live Births          1    # Outcome Date GA Lbr Len/2nd Weight Sex Delivery Anes PTL Lv  1 Term 1999   7 lb 9 oz (3.43 kg) F Vag-Spont   LIV    Past Medical History:  Diagnosis Date  . Abnormal vaginal Pap smear    10+ years ago- no colpo repeat was normal  . Anxiety   . AR (allergic rhinitis)   . Bipolar affective (HCC)    pt reported  . Bipolar disorder (HCC)   . Eating disorder    Under control per patient  . Fibromyalgia   . GERD (gastroesophageal reflux disease)   . Kidney stone   . Painful intercourse   . Painful menstrual periods   . Pelvic pain in female   . PTSD (post-traumatic stress disorder)   . Renal disorder   . Spinal stenosis   . Uterine polyp   . VWD (acquired von Willebrand's disease) [facility]     Past Surgical History:  Procedure Laterality Date  . ABDOMINAL HYSTERECTOMY    . CYSTOSCOPY WITH STENT PLACEMENT Left 04/19/2017   Procedure: CYSTOSCOPY WITH STENT PLACEMENT;  Surgeon: Crista Elliot, MD;  Location: [facility] ORS;  Service: Urology;  Laterality: Left;  . HYSTEROSCOPY     removed polyps  . LAPAROSCOPIC VAGINAL HYSTERECTOMY  2015   at [facility]  . LAPAROSCOPY Left 01/10/2015   Procedure: LAPAROSCOPY OPERATIVE with biopsy, left oopherectomy;  Surgeon: Herold Harms, MD;  Location: [facility] ORS;  Service: Gynecology;  Laterality: Left;   . LAPAROSCOPY ABDOMEN DIAGNOSTIC    . TUBAL LIGATION    . URETEROSCOPY WITH HOLMIUM LASER LITHOTRIPSY Left 04/19/2017   Procedure: URETEROSCOPY WITH HOLMIUM LASER LITHOTRIPSY;  Surgeon: Crista Elliot, MD;  Location: [facility] ORS;  Service: Urology;  Laterality: Left;    Current Outpatient Medications on File Prior to Visit  Medication Sig Dispense Refill  . ALPRAZolam (XANAX) 1 MG tablet Take by mouth 2 (two) times daily as needed.     Marland Kitchen ibuprofen (ADVIL,MOTRIN) 400 MG tablet Take 1 tablet (400 mg total) by mouth every 6 (six) hours as needed. 20 tablet 0  . zolpidem (AMBIEN) 10 MG tablet Take 1 tablet by mouth at bedtime as needed.     . cephALEXin (KEFLEX) 500 MG capsule Take 1 capsule (500 mg total) by mouth 2 (two) times daily. (Patient not taking: Reported on 11/13/2017) 20 capsule 0  . fluconazole (DIFLUCAN) 100 MG tablet Take 1 tablet (100 mg total) by mouth daily. Take 1 tablet, then if persistent symptoms take a second tablet in 3 days (Patient not taking: Reported on 11/13/2017) 2 tablet 0  . HYDROcodone-acetaminophen (NORCO/VICODIN) 5-325 MG tablet Take 1 tablet by mouth every 6 (six) hours as needed for moderate pain. (Patient not taking: Reported on 11/13/2017) 6 tablet 0  .  ketorolac (TORADOL) 10 MG tablet Take 1 tablet (10 mg total) by mouth every 6 (six) hours as needed. (Patient not taking: Reported on 11/13/2017) 20 tablet 0  . ondansetron (ZOFRAN ODT) 4 MG disintegrating tablet Take 1 tablet (4 mg total) by mouth every 6 (six) hours as needed for nausea or vomiting. (Patient not taking: Reported on 11/13/2017) 20 tablet 0  . oxyCODONE-acetaminophen (ROXICET) 5-325 MG tablet Take 1 tablet by mouth every 6 (six) hours as needed. (Patient not taking: Reported on 11/13/2017) 15 tablet 0  . QUEtiapine (SEROQUEL) 200 MG tablet Take 200 mg by mouth daily.  2  . traZODone (DESYREL) 100 MG tablet Take 200 mg by mouth at bedtime.      No current facility-administered medications on file  prior to visit.     Allergies  Allergen Reactions  . Wellbutrin [Bupropion Hcl] Other (See Comments)    Reaction:  Suicidal   . Augmentin [Amoxicillin-Pot Clavulanate] Diarrhea, Nausea And Vomiting and Other (See Comments)  . Hydrocodone-Acetaminophen Nausea And Vomiting  . Lasix [Furosemide] Rash  . Nitrofurantoin Monohyd Macro Rash    Social History   Socioeconomic History  . Marital status: Widowed    Spouse name: Not on file  . Number of children: 1  . Years of education: Not on file  . Highest education level: Not on file  Occupational History  . Occupation: Un-employed due to bipolar  Social Needs  . Financial resource strain: Not on file  . Food insecurity:    Worry: Not on file    Inability: Not on file  . Transportation needs:    Medical: Not on file    Non-medical: Not on file  Tobacco Use  . Smoking status: Current Every Day Smoker    Packs/day: 1.00    Types: Cigarettes    Start date: 05/18/1995  . Smokeless tobacco: Never Used  Substance and Sexual Activity  . Alcohol use: No    Alcohol/week: 0.0 oz    Comment: Socially- seldom  . Drug use: No  . Sexual activity: Yes    Birth control/protection: Surgical  Lifestyle  . Physical activity:    Days per week: Not on file    Minutes per session: Not on file  . Stress: Not on file  Relationships  . Social connections:    Talks on phone: Not on file    Gets together: Not on file    Attends religious service: Not on file    Active member of club or organization: Not on file    Attends meetings of clubs or organizations: Not on file    Relationship status: Not on file  . Intimate partner violence:    Fear of current or ex partner: Not on file    Emotionally abused: Not on file    Physically abused: Not on file    Forced sexual activity: Not on file  Other Topics Concern  . Not on file  Social History Narrative   Regular Exercise -  NO   Daily Caffeine Use:  1 cup coffee in am      1 child, one  step child          Family History  Problem Relation Age of Onset  . Hypertension Father   . Sleep apnea Mother   . Breast cancer Paternal Grandmother   . Diabetes Maternal Grandmother   . Breast cancer Maternal Grandmother   . Diabetes Maternal Aunt   . Diabetes Maternal Uncle  The following portions of the patient's history were reviewed and updated as appropriate: allergies, current medications, past family history, past medical history, past social history, past surgical history and problem list.  Review of Systems Pertinent items noted in HPI and remainder of comprehensive ROS otherwise negative.   Objective:  BP 106/72   Pulse 90   Ht  (1.575 m)   Wt 146 lb 1 oz (66.3 kg)   LMP 09/16/2015 Comment: neg preg test  BMI 26.72 kg/m  CONSTITUTIONAL: Well-developed, well-nourished female in no acute distress.  HENT:  Normocephalic, atraumatic, External right and left ear normal. Oropharynx is clear and moist EYES: Conjunctivae and EOM are normal. Pupils are equal, round, and reactive to light. No scleral icterus.  NECK: Normal range of motion, supple, no masses.  Normal thyroid.  SKIN: Skin is warm and dry. No rash noted. Not diaphoretic. No erythema. No pallor. NEUROLOGIC: Alert and oriented to person, place, and time. Normal reflexes, muscle tone coordination. No cranial nerve deficit noted. PSYCHIATRIC: Normal mood and affect. Normal behavior. Normal judgment and thought content. CARDIOVASCULAR: Normal heart rate noted, regular rhythm RESPIRATORY: Clear to auscultation bilaterally. Effort and breath sounds normal, no problems with respiration noted. BREASTS: Symmetric in size. No masses, skin changes, nipple drainage, or lymphadenopathy. ABDOMEN: Soft, normal bowel sounds, no distention noted.  No tenderness, rebound or guarding.  PELVIC: Normal appearing external genitalia; normal appearing vaginal mucosa No abnormal discharge noted.   Normal uterine size, no other  palpable masses, no tenderness AL: Normal range of motion. No tenderness.  No cyanosis, clubbing, or edema.  2+ distal pulses.   Assessment and Plan:  1. Screening for breast cancer - [place] DIGITAL SCREENING BILATERAL; Future  Sample of osphena given with instructions for use Mammogram scheduled Routine preventative health maintenance measures emphasized. Please refer to After Visit Summary for other counseling recommendations.   Doreene Burke, CNM

## 2017-11-15 ENCOUNTER — Ambulatory Visit (INDEPENDENT_AMBULATORY_CARE_PROVIDER_SITE_OTHER): Payer: Medicare Other | Admitting: Nurse Practitioner

## 2017-11-15 ENCOUNTER — Encounter: Payer: Self-pay | Admitting: Nurse Practitioner

## 2017-11-15 VITALS — BP 110/80 | HR 100 | Temp 98.9°F | Resp 16 | Ht 65.0 in | Wt 148.5 lb

## 2017-11-15 DIAGNOSIS — M25551 Pain in right hip: Secondary | ICD-10-CM | POA: Diagnosis not present

## 2017-11-15 DIAGNOSIS — M542 Cervicalgia: Secondary | ICD-10-CM | POA: Diagnosis not present

## 2017-11-15 DIAGNOSIS — Z716 Tobacco abuse counseling: Secondary | ICD-10-CM

## 2017-11-15 DIAGNOSIS — M25561 Pain in right knee: Secondary | ICD-10-CM | POA: Diagnosis not present

## 2017-11-15 DIAGNOSIS — M25562 Pain in left knee: Secondary | ICD-10-CM | POA: Diagnosis not present

## 2017-11-15 DIAGNOSIS — G8929 Other chronic pain: Secondary | ICD-10-CM | POA: Diagnosis not present

## 2017-11-15 DIAGNOSIS — M25552 Pain in left hip: Secondary | ICD-10-CM | POA: Diagnosis not present

## 2017-11-15 DIAGNOSIS — F17213 Nicotine dependence, cigarettes, with withdrawal: Secondary | ICD-10-CM | POA: Diagnosis not present

## 2017-11-15 DIAGNOSIS — F633 Trichotillomania: Secondary | ICD-10-CM | POA: Diagnosis not present

## 2017-11-15 DIAGNOSIS — F3181 Bipolar II disorder: Secondary | ICD-10-CM | POA: Diagnosis not present

## 2017-11-15 DIAGNOSIS — Z720 Tobacco use: Secondary | ICD-10-CM | POA: Diagnosis not present

## 2017-11-15 DIAGNOSIS — F4310 Post-traumatic stress disorder, unspecified: Secondary | ICD-10-CM | POA: Diagnosis not present

## 2017-11-15 MED ORDER — TIZANIDINE HCL 2 MG PO TABS: 2.0000 mg | ORAL_TABLET | Freq: Four times a day (QID) | ORAL | 0 refills | Status: DC | PRN

## 2017-11-15 NOTE — Progress Notes (Addendum)
Name: Casey Hodges   MRN: [id number]    DOB: 06/04/1976   Date:11/15/2017       Progress Note  Subjective  Chief Complaint  Chief Complaint  Patient presents with  . Referral    to orthopedic for hip and knee pain, nck and left shoulder pain    HPI  Patient endorses neck pain ongoing for 1.5 years states used to sleep on the couch when she started having neck pain and has had chronic neck pain since then. Tried exercising and feels like in the last month has worsened- pain hasn't gotten better and thinks she over did it at the gym.  Takes ibuprofen with mild relief, tried one of her mothers flexeril that helped with pain but just took it once.  Patient also notes that her hips an knees pop ongoing for greater than 5 years and is worse with exercise. Has arthritis dx in MRI over 10 years in lower spine and concerned she has it in her hips and knees. Requesting ortho referral  Patient Active Problem List   Diagnosis Date Noted  . Acute kidney injury (HCC) 04/19/2017  . Orthostasis 08/24/2016  . Antibiotic-induced yeast infection 08/22/2016  . Exercise-induced tachycardia 08/22/2016  . Constipation due to opioid therapy 11/04/2015  . Severe episode of recurrent major depressive disorder, without psychotic features (HCC)   . GERD (gastroesophageal reflux disease) 09/16/2015  . Smoker 07/25/2015  . Snoring 06/09/2015  . Cardiac murmur 05/25/2015  . Drug-induced nausea and vomiting 03/22/2015  . Cyst, bone 02/14/2015  . Pelvic adhesive disease 01/21/2015  . Arthritis 12/19/2014  . Kidney stone on left side 12/13/2014  . Anxiety disorder 12/13/2014  . Postural dizziness 12/13/2014  . Subcutaneous cyst 12/13/2014  . Nephrolithiasis 05/23/2012  . Chronic pelvic pain in female 05/23/2012  . Fibromyalgia 05/23/2012  . Bipolar disorder with depression (HCC) 06/05/2011    Past Medical History:  Diagnosis Date  . Abnormal vaginal Pap smear    10+ years ago- no colpo repeat was  normal  . Anxiety   . AR (allergic rhinitis)   . Bipolar affective (HCC)    pt reported  . Bipolar disorder (HCC)   . Eating disorder    Under control per patient  . Fibromyalgia   . GERD (gastroesophageal reflux disease)   . Kidney stone   . Painful intercourse   . Painful menstrual periods   . Pelvic pain in female   . PTSD (post-traumatic stress disorder)   . Renal disorder   . Spinal stenosis   . Uterine polyp   . VWD (acquired von Willebrand's disease) [facility])     Past Surgical History:  Procedure Laterality Date  . ABDOMINAL HYSTERECTOMY    . CYSTOSCOPY WITH STENT PLACEMENT Left 04/19/2017   Procedure: CYSTOSCOPY WITH STENT PLACEMENT;  Surgeon: Crista Elliot, MD;  Location: [facility] ORS;  Service: Urology;  Laterality: Left;  . HYSTEROSCOPY     removed polyps  . LAPAROSCOPIC VAGINAL HYSTERECTOMY  2015   at [facility]  . LAPAROSCOPY Left 01/10/2015   Procedure: LAPAROSCOPY OPERATIVE with biopsy, left oopherectomy;  Surgeon: Herold Harms, MD;  Location: [facility] ORS;  Service: Gynecology;  Laterality: Left;  . LAPAROSCOPY ABDOMEN DIAGNOSTIC    . TUBAL LIGATION    . URETEROSCOPY WITH HOLMIUM LASER LITHOTRIPSY Left 04/19/2017   Procedure: URETEROSCOPY WITH HOLMIUM LASER LITHOTRIPSY;  Surgeon: Crista Elliot, MD;  Location: [facility] ORS;  Service: Urology;  Laterality: Left;    Social  History   Tobacco Use  . Smoking status: Current Every Day Smoker    Packs/day: 1.00    Types: Cigarettes    Start date: 05/18/1995  . Smokeless tobacco: Never Used  Substance Use Topics  . Alcohol use: No    Alcohol/week: 0.0 oz    Comment: Socially- seldom     Current Outpatient Medications:  .  ALPRAZolam (XANAX) 1 MG tablet, Take by mouth 2 (two) times daily as needed. , Disp: , Rfl:  .  ibuprofen (ADVIL,MOTRIN) 400 MG tablet, Take 1 tablet (400 mg total) by mouth every 6 (six) hours as needed., Disp: 20 tablet, Rfl: 0 .  valACYclovir (VALTREX) 500 MG tablet, Take 1 tablet  (500 mg total) by mouth daily., Disp: 30 tablet, Rfl: 11 .  zolpidem (AMBIEN) 10 MG tablet, Take 1 tablet by mouth at bedtime as needed. , Disp: , Rfl:  .  cephALEXin (KEFLEX) 500 MG capsule, Take 1 capsule (500 mg total) by mouth 2 (two) times daily. (Patient not taking: Reported on 11/13/2017), Disp: 20 capsule, Rfl: 0 .  fluconazole (DIFLUCAN) 100 MG tablet, Take 1 tablet (100 mg total) by mouth daily. Take 1 tablet, then if persistent symptoms take a second tablet in 3 days (Patient not taking: Reported on 11/13/2017), Disp: 2 tablet, Rfl: 0 .  HYDROcodone-acetaminophen (NORCO/VICODIN) 5-325 MG tablet, Take 1 tablet by mouth every 6 (six) hours as needed for moderate pain. (Patient not taking: Reported on 11/13/2017), Disp: 6 tablet, Rfl: 0 .  ketorolac (TORADOL) 10 MG tablet, Take 1 tablet (10 mg total) by mouth every 6 (six) hours as needed. (Patient not taking: Reported on 11/15/2017), Disp: 20 tablet, Rfl: 0 .  ondansetron (ZOFRAN ODT) 4 MG disintegrating tablet, Take 1 tablet (4 mg total) by mouth every 6 (six) hours as needed for nausea or vomiting. (Patient not taking: Reported on 11/15/2017), Disp: 20 tablet, Rfl: 0 .  Ospemifene 60 MG TABS, Take 1 tablet by mouth daily. (Patient not taking: Reported on 11/15/2017), Disp: 30 tablet, Rfl: 1 .  Oxcarbazepine (TRILEPTAL) 300 MG tablet, , Disp: , Rfl: 2 .  oxyCODONE-acetaminophen (ROXICET) 5-325 MG tablet, Take 1 tablet by mouth every 6 (six) hours as needed. (Patient not taking: Reported on 11/15/2017), Disp: 15 tablet, Rfl: 0 .  QUEtiapine (SEROQUEL) 200 MG tablet, Take 200 mg by mouth daily., Disp: , Rfl: 2 .  traZODone (DESYREL) 100 MG tablet, Take 200 mg by mouth at bedtime. , Disp: , Rfl:   Allergies  Allergen Reactions  . Wellbutrin [Bupropion Hcl] Other (See Comments)    Reaction:  Suicidal   . Augmentin [Amoxicillin-Pot Clavulanate] Diarrhea, Nausea And Vomiting and Other (See Comments)  . Hydrocodone-Acetaminophen Nausea And Vomiting  .  Lasix [Furosemide] Rash  . Nitrofurantoin Monohyd Macro Rash    ROS  No other specific complaints in a complete review of systems (except as listed in HPI above).  Objective  Vitals:   11/15/17 1421  BP: 110/80  Pulse: 100  Resp: 16  Temp: 98.9 F (37.2 C)  TempSrc: Oral  SpO2: 97%  Weight: 148 lb 8 oz (67.4 kg)  Height:  (1.651 m)     Body mass index is 24.71 kg/m.  Nursing Note and Vital Signs reviewed.  Physical Exam   Constitutional: Patient appears well-developed and well-nourished. No distress.  Cardiovascular: Normal rate, regular rhythm, S1/S2 present.  No murmur or rub heard.  Pulmonary/Chest: Effort normal and breath sounds clear. No respiratory distress or retractions. MSK:  full ROM in neck, hips and knees. No obvious deformity, redness or swelling noted.  Psychiatric: Patient has a normal mood and affect. behavior is normal. Judgment and thought content normal.  No results found for this or any previous visit (from the past 72 hour(s)).  Assessment & Plan  1. Neck pain - Ambulatory referral to Orthopedic Surgery - tiZANidine (ZANAFLEX) 2 MG tablet; Take 1 tablet (2 mg total) by mouth every 6 (six) hours as needed for muscle spasms.  Dispense: 30 tablet; Refill: 0  2. Pain of both hip joints - Ambulatory referral to Orthopedic Surgery  3. Chronic pain of both knees - Ambulatory referral to Orthopedic Surgery  4. Tobacco use 15 cigg/day   5. Encounter for smoking cessation counseling - has had SI with chantix and wellbutrin. Discussed plan, barriers and solutions, will recheck at physical   - alternate between ice/heat 20 min a time 4-5 times a week -icy/heat  - massage - light stretching - tylenol first for pain and then ibuprofen in not improved -drink plenty of water- 64 ounces a day  - muscle relaxer  You can quit smoking! - smoking 15 cigarettes a day now, next week cut down to 12 and practice grounding techniques and continue to  cut down.  -Red flags and when to present for emergency care or RTC including fever >101.50F, chest pain, shortness of breath, new/worsening/un-resolving symptoms, weakness,  reviewed with patient at time of visit. Follow up and care instructions discussed and provided in AVS.   ------------------------------------------ I have reviewed this encounter including the documentation in this note and/or discussed this patient with the provider, Sharyon Cable DNP AGNP-C. I am certifying that I agree with the content of this note as supervising physician. Baruch Gouty, MD [facility] 11/19/2017, 6:27 AM

## 2017-11-15 NOTE — Patient Instructions (Addendum)
- alternate between ice/heat 20 min a time 4-5 times a week -icy/heat  - massage - light stretching - tylenol first for pain and then ibuprofen in not improved -drink plenty of water- 64 ounces a day  - muscle relaxer  You can quit smoking! - smoking 15 cigarettes a day now, next week cut down to 12 and practice grounding techniques and continue to cut down.  Neck Exercises Neck exercises can be important for many reasons:  They can help you to improve and maintain flexibility in your neck. This can be especially important as you age.  They can help to make your neck stronger. This can make movement easier.  They can reduce or prevent neck pain.  They may help your upper back.  Ask your health care provider which neck exercises would be best for you. Exercises Neck Press Repeat this exercise 10 times. Do it first thing in the morning and right before bed or as told by your health care provider. 1. Lie on your back on a firm bed or on the floor with a pillow under your head. 2. Use your neck muscles to push your head down on the pillow and straighten your spine. 3. Hold the position as well as you can. Keep your head facing up and your chin tucked. 4. Slowly count to 5 while holding this position. 5. Relax for a few seconds. Then repeat.  Isometric Strengthening Do a full set of these exercises 2 times a day or as told by your health care provider. 1. Sit in a supportive chair and place your hand on your forehead. 2. Push forward with your head and neck while pushing back with your hand. Hold for 10 seconds. 3. Relax. Then repeat the exercise 3 times. 4. Next, do thesequence again, this time putting your hand against the back of your head. Use your head and neck to push backward against the hand pressure. 5. Finally, do the same exercise on either side of your head, pushing sideways against the pressure of your hand.  Prone Head Lifts Repeat this exercise 5 times. Do this 2  times a day or as told by your health care provider. 1. Lie face-down, resting on your elbows so that your chest and upper back are raised. 2. Start with your head facing downward, near your chest. Position your chin either on or near your chest. 3. Slowly lift your head upward. Lift until you are looking straight ahead. Then continue lifting your head as far back as you can stretch. 4. Hold your head up for 5 seconds. Then slowly lower it to your starting position.  Supine Head Lifts Repeat this exercise 8-10 times. Do this 2 times a day or as told by your health care provider. 1. Lie on your back, bending your knees to point to the ceiling and keeping your feet flat on the floor. 2. Lift your head slowly off the floor, raising your chin toward your chest. 3. Hold for 5 seconds. 4. Relax and repeat.  Scapular Retraction Repeat this exercise 5 times. Do this 2 times a day or as told by your health care provider. 1. Stand with your arms at your sides. Look straight ahead. 2. Slowly pull both shoulders backward and downward until you feel a stretch between your shoulder blades in your upper back. 3. Hold for 10-30 seconds. 4. Relax and repeat.  Contact a health care provider if:  Your neck pain or discomfort gets much worse when you do  an exercise.  Your neck pain or discomfort does not improve within 2 hours after you exercise. If you have any of these problems, stop exercising right away. Do not do the exercises again unless your health care provider says that you can. Get help right away if:  You develop sudden, severe neck pain. If this happens, stop exercising right away. Do not do the exercises again unless your health care provider says that you can. Exercises Neck Stretch  Repeat this exercise 3-5 times. 1. Do this exercise while standing or while sitting in a chair. 2. Place your feet flat on the floor, shoulder-width apart. 3. Slowly turn your head to the right. Turn it all  the way to the right so you can look over your right shoulder. Do not tilt or tip your head. 4. Hold this position for 10-30 seconds. 5. Slowly turn your head to the left, to look over your left shoulder. 6. Hold this position for 10-30 seconds.  Neck Retraction Repeat this exercise 8-10 times. Do this 3-4 times a day or as told by your health care provider. 1. Do this exercise while standing or while sitting in a sturdy chair. 2. Look straight ahead. Do not bend your neck. 3. Use your fingers to push your chin backward. Do not bend your neck for this movement. Continue to face straight ahead. If you are doing the exercise properly, you will feel a slight sensation in your throat and a stretch at the back of your neck. 4. Hold the stretch for 1-2 seconds. Relax and repeat.  This information is not intended to replace advice given to you by your health care provider. Make sure you discuss any questions you have with your health care provider. Document Released: 05/23/2015 Document Revised: 11/17/2015 Document Reviewed: 12/20/2014 Elsevier Interactive Patient Education  2018 [employer].   Coping with Quitting Smoking Quitting smoking is a physical and mental challenge. You will face cravings, withdrawal symptoms, and temptation. Before quitting, work with your health care provider to make a plan that can help you cope. Preparation can help you quit and keep you from giving in. How can I cope with cravings? Cravings usually last for 5-10 minutes. If you get through it, the craving will pass. Consider taking the following actions to help you cope with cravings:  Keep your mouth busy: ? Chew sugar-free gum. ? Suck on hard candies or a straw. ? Brush your teeth.  Keep your hands and body busy: ? Immediately change to a different activity when you feel a craving. ? Squeeze or play with a ball. ? Do an activity or a hobby, like making bead jewelry, practicing needlepoint, or working with  wood. ? Mix up your normal routine. ? Take a short exercise break. Go for a quick walk or run up and down stairs. ? Spend time in public places where smoking is not allowed.  Focus on doing something kind or helpful for someone else.  Call a friend or family member to talk during a craving.  Join a support group.  Call a quit line, such as 1-800-QUIT-NOW.  Talk with your health care provider about medicines that might help you cope with cravings and make quitting easier for you.  How can I deal with withdrawal symptoms? Your body may experience negative effects as it tries to get used to not having nicotine in the system. These effects are called withdrawal symptoms. They may include:  Feeling hungrier than normal.  Trouble concentrating.  Irritability.  Trouble sleeping.  Feeling depressed.  Restlessness and agitation.  Craving a cigarette.  To manage withdrawal symptoms:  Avoid places, people, and activities that trigger your cravings.  Remember why you want to quit.  Get plenty of sleep.  Avoid coffee and other caffeinated drinks. These may worsen some of your symptoms.  How can I handle social situations? Social situations can be difficult when you are quitting smoking, especially in the first few weeks. To manage this, you can:  Avoid parties, bars, and other social situations where people might be smoking.  Avoid alcohol.  Leave right away if you have the urge to smoke.  Explain to your family and friends that you are quitting smoking. Ask for understanding and support.  Plan activities with friends or family where smoking is not an option.  What are some ways I can cope with stress? Wanting to smoke may cause stress, and stress can make you want to smoke. Find ways to manage your stress. Relaxation techniques can help. For example:  Breathe slowly and deeply, in through your nose and out through your mouth.  Listen to soothing, relaxing  music.  Talk with a family member or friend about your stress.  Light a candle.  Soak in a bath or take a shower.  Think about a peaceful place.  What are some ways I can prevent weight gain? Be aware that many people gain weight after they quit smoking. However, not everyone does. To keep from gaining weight, have a plan in place before you quit and stick to the plan after you quit. Your plan should include:  Having healthy snacks. When you have a craving, it may help to: ? Eat plain popcorn, crunchy carrots, celery, or other cut vegetables. ? Chew sugar-free gum.  Changing how you eat: ? Eat small portion sizes at meals. ? Eat 4-6 small meals throughout the day instead of 1-2 large meals a day. ? Be mindful when you eat. Do not watch television or do other things that might distract you as you eat.  Exercising regularly: ? Make time to exercise each day. If you do not have time for a long workout, do short bouts of exercise for 5-10 minutes several times a day. ? Do some form of strengthening exercise, like weight lifting, and some form of aerobic exercise, like running or swimming.  Drinking plenty of water or other low-calorie or no-calorie drinks. Drink 6-8 glasses of water daily, or as much as instructed by your health care provider.  Summary  Quitting smoking is a physical and mental challenge. You will face cravings, withdrawal symptoms, and temptation to smoke again. Preparation can help you as you go through these challenges.  You can cope with cravings by keeping your mouth busy (such as by chewing gum), keeping your body and hands busy, and making calls to family, friends, or a helpline for people who want to quit smoking.  You can cope with withdrawal symptoms by avoiding places where people smoke, avoiding drinks with caffeine, and getting plenty of rest.  Ask your health care provider about the different ways to prevent weight gain, avoid stress, and handle social  situations. This information is not intended to replace advice given to you by your health care provider. Make sure you discuss any questions you have with your health care provider. Document Released: 06/08/2016 Document Revised: 06/08/2016 Document Reviewed: 06/08/2016 Elsevier Interactive Patient Education  [employer].

## 2017-11-20 DIAGNOSIS — M25552 Pain in left hip: Secondary | ICD-10-CM | POA: Diagnosis not present

## 2017-11-20 DIAGNOSIS — M542 Cervicalgia: Secondary | ICD-10-CM | POA: Diagnosis not present

## 2017-11-20 DIAGNOSIS — M25551 Pain in right hip: Secondary | ICD-10-CM | POA: Diagnosis not present

## 2017-11-20 DIAGNOSIS — M25562 Pain in left knee: Secondary | ICD-10-CM | POA: Diagnosis not present

## 2017-11-20 DIAGNOSIS — M25561 Pain in right knee: Secondary | ICD-10-CM | POA: Diagnosis not present

## 2017-11-20 DIAGNOSIS — M2550 Pain in unspecified joint: Secondary | ICD-10-CM | POA: Diagnosis not present

## 2017-11-22 DIAGNOSIS — F3181 Bipolar II disorder: Secondary | ICD-10-CM | POA: Diagnosis not present

## 2017-11-22 DIAGNOSIS — F633 Trichotillomania: Secondary | ICD-10-CM | POA: Diagnosis not present

## 2017-11-22 DIAGNOSIS — F4310 Post-traumatic stress disorder, unspecified: Secondary | ICD-10-CM | POA: Diagnosis not present

## 2017-11-29 DIAGNOSIS — F3181 Bipolar II disorder: Secondary | ICD-10-CM | POA: Diagnosis not present

## 2017-11-29 DIAGNOSIS — F633 Trichotillomania: Secondary | ICD-10-CM | POA: Diagnosis not present

## 2017-11-29 DIAGNOSIS — F4310 Post-traumatic stress disorder, unspecified: Secondary | ICD-10-CM | POA: Diagnosis not present

## 2017-12-03 DIAGNOSIS — M2550 Pain in unspecified joint: Secondary | ICD-10-CM | POA: Diagnosis not present

## 2017-12-03 DIAGNOSIS — M542 Cervicalgia: Secondary | ICD-10-CM | POA: Diagnosis not present

## 2017-12-03 DIAGNOSIS — M7918 Myalgia, other site: Secondary | ICD-10-CM | POA: Diagnosis not present

## 2017-12-03 DIAGNOSIS — M797 Fibromyalgia: Secondary | ICD-10-CM | POA: Diagnosis not present

## 2017-12-03 DIAGNOSIS — M7062 Trochanteric bursitis, left hip: Secondary | ICD-10-CM | POA: Diagnosis not present

## 2017-12-03 DIAGNOSIS — M7061 Trochanteric bursitis, right hip: Secondary | ICD-10-CM | POA: Diagnosis not present

## 2017-12-13 DIAGNOSIS — F633 Trichotillomania: Secondary | ICD-10-CM | POA: Diagnosis not present

## 2017-12-13 DIAGNOSIS — F3181 Bipolar II disorder: Secondary | ICD-10-CM | POA: Diagnosis not present

## 2017-12-13 DIAGNOSIS — F4310 Post-traumatic stress disorder, unspecified: Secondary | ICD-10-CM | POA: Diagnosis not present

## 2017-12-18 DIAGNOSIS — F4310 Post-traumatic stress disorder, unspecified: Secondary | ICD-10-CM | POA: Diagnosis not present

## 2017-12-18 DIAGNOSIS — F3181 Bipolar II disorder: Secondary | ICD-10-CM | POA: Diagnosis not present

## 2017-12-18 DIAGNOSIS — F633 Trichotillomania: Secondary | ICD-10-CM | POA: Diagnosis not present

## 2017-12-20 DIAGNOSIS — F3181 Bipolar II disorder: Secondary | ICD-10-CM | POA: Diagnosis not present

## 2017-12-20 DIAGNOSIS — F4310 Post-traumatic stress disorder, unspecified: Secondary | ICD-10-CM | POA: Diagnosis not present

## 2017-12-20 DIAGNOSIS — F633 Trichotillomania: Secondary | ICD-10-CM | POA: Diagnosis not present

## 2018-01-04 ENCOUNTER — Emergency Department: Payer: Medicare Other

## 2018-01-04 ENCOUNTER — Other Ambulatory Visit: Payer: Self-pay

## 2018-01-04 ENCOUNTER — Encounter: Payer: Self-pay | Admitting: Emergency Medicine

## 2018-01-04 ENCOUNTER — Emergency Department
Admission: EM | Admit: 2018-01-04 | Discharge: 2018-01-04 | Disposition: A | Discharge status: Home / Self Care | Payer: Medicare Other | Attending: Emergency Medicine | Admitting: Emergency Medicine

## 2018-01-04 DIAGNOSIS — F17210 Nicotine dependence, cigarettes, uncomplicated: Secondary | ICD-10-CM | POA: Diagnosis not present

## 2018-01-04 DIAGNOSIS — M545 Low back pain: Secondary | ICD-10-CM | POA: Diagnosis present

## 2018-01-04 DIAGNOSIS — Z79899 Other long term (current) drug therapy: Secondary | ICD-10-CM | POA: Insufficient documentation

## 2018-01-04 DIAGNOSIS — N390 Urinary tract infection, site not specified: Secondary | ICD-10-CM | POA: Diagnosis not present

## 2018-01-04 DIAGNOSIS — N289 Disorder of kidney and ureter, unspecified: Secondary | ICD-10-CM | POA: Insufficient documentation

## 2018-01-04 DIAGNOSIS — R300 Dysuria: Secondary | ICD-10-CM | POA: Diagnosis not present

## 2018-01-04 LAB — URINALYSIS, COMPLETE (UACMP) WITH MICROSCOPIC
Bilirubin Urine: NEGATIVE
GLUCOSE, UA: NEGATIVE mg/dL
Ketones, ur: 5 mg/dL — AB
Leukocytes, UA: NEGATIVE
NITRITE: NEGATIVE
PROTEIN: 30 mg/dL — AB
SPECIFIC GRAVITY, URINE: 1.023 (ref 1.005–1.030)
pH: 6 (ref 5.0–8.0)

## 2018-01-04 LAB — BASIC METABOLIC PANEL
ANION GAP: 12 (ref 5–15)
BUN: 6 mg/dL (ref 6–20)
CALCIUM: 9.2 mg/dL (ref 8.9–10.3)
CO2: 20 mmol/L — ABNORMAL LOW (ref 22–32)
CREATININE: 0.58 mg/dL (ref 0.44–1.00)
Chloride: 111 mmol/L (ref 98–111)
GFR calc Af Amer: >60 mL/min (ref >60)
GFR calc non Af Amer: >60 mL/min (ref >60)
GLUCOSE: 112 mg/dL — AB (ref 70–99)
Potassium: 3.5 mmol/L (ref 3.5–5.1)
Sodium: 143 mmol/L (ref 135–145)

## 2018-01-04 LAB — POCT PREGNANCY, URINE: PREG TEST UR: NEGATIVE

## 2018-01-04 LAB — CBC
HCT: 42.3 % (ref 35.0–47.0)
Hemoglobin: 14.4 g/dL (ref 12.0–16.0)
MCH: 31.4 pg (ref 26.0–34.0)
MCHC: 34.1 g/dL (ref 32.0–36.0)
MCV: 92.1 fL (ref 80.0–100.0)
Platelets: 255 10*3/uL (ref 150–440)
RBC: 4.59 MIL/uL (ref 3.80–5.20)
RDW: 15.1 % — AB (ref 11.5–14.5)
WBC: 6.9 10*3/uL (ref 3.6–11.0)

## 2018-01-04 MED ORDER — PROMETHAZINE HCL 25 MG/ML IJ SOLN
12.5000 mg | Freq: Once | INTRAMUSCULAR | Status: AC
  Administered 2018-01-04: 12.5 mg via INTRAMUSCULAR
  Filled 2018-01-04: qty 1

## 2018-01-04 MED ORDER — PHENAZOPYRIDINE HCL 100 MG PO TABS
100.0000 mg | ORAL_TABLET | Freq: Once | ORAL | Status: AC
  Administered 2018-01-04: 100 mg via ORAL
  Filled 2018-01-04: qty 1

## 2018-01-04 MED ORDER — SULFAMETHOXAZOLE-TRIMETHOPRIM 800-160 MG PO TABS: 1.0000 | ORAL_TABLET | Freq: Two times a day (BID) | ORAL | 0 refills | Status: AC

## 2018-01-04 MED ORDER — ONDANSETRON HCL 4 MG/2ML IJ SOLN
4.0000 mg | Freq: Once | INTRAMUSCULAR | Status: AC
  Administered 2018-01-04: 4 mg via INTRAVENOUS

## 2018-01-04 MED ORDER — KETOROLAC TROMETHAMINE 30 MG/ML IJ SOLN
30.0000 mg | Freq: Once | INTRAMUSCULAR | Status: AC
  Administered 2018-01-04: 30 mg via INTRAVENOUS
  Filled 2018-01-04: qty 1

## 2018-01-04 MED ORDER — ONDANSETRON HCL 4 MG/2ML IJ SOLN
INTRAMUSCULAR | Status: AC
  Filled 2018-01-04: qty 2

## 2018-01-04 MED ORDER — SULFAMETHOXAZOLE-TRIMETHOPRIM 800-160 MG PO TABS
1.0000 | ORAL_TABLET | Freq: Once | ORAL | Status: AC
  Administered 2018-01-04: 1 via ORAL
  Filled 2018-01-04: qty 1

## 2018-01-04 MED ORDER — SODIUM CHLORIDE 0.9 % IV BOLUS
1000.0000 mL | Freq: Once | INTRAVENOUS | Status: AC
  Administered 2018-01-04: 1000 mL via INTRAVENOUS

## 2018-01-04 MED ORDER — PHENAZOPYRIDINE HCL 200 MG PO TABS
ORAL_TABLET | ORAL | Status: AC
  Filled 2018-01-04: qty 1

## 2018-01-04 MED ORDER — MORPHINE SULFATE (PF) 4 MG/ML IV SOLN
4.0000 mg | Freq: Once | INTRAVENOUS | Status: AC
  Administered 2018-01-04: 4 mg via INTRAVENOUS
  Filled 2018-01-04: qty 1

## 2018-01-04 MED ORDER — PROMETHAZINE HCL 25 MG PO TABS: 25.0000 mg | ORAL_TABLET | Freq: Three times a day (TID) | ORAL | 0 refills | Status: DC | PRN

## 2018-01-04 NOTE — ED Provider Notes (Signed)
[facility] Emergency Department Provider Note   ____________________________________________   First MD Initiated Contact with Patient 01/04/18 1532     (approximate)  I have reviewed the triage vital signs and the nursing notes.   HISTORY  Chief Complaint Back Pain    HPI BLAKELYN DINGES is a 42 y.o. female reports a history of multiple kidney stones in the past  Patient reports she has been having lower back and pelvic discomfort.  She reports that feels like another "kidney stone" since she is been experiencing severe sharp pain in her lower back since last evening.  She reports was on vacation at the beach, began expensing pain in her low back last night.  Reports she had the same multiple times with kidney stones.  Reports she had to have a kidney stone stented in December roughly, she been doing well since then but reports she is passed multiple multiple stones in the past  Denies pregnancy.  No vaginal pain or discharge.  Reports she has an urgency to urinate.  No fevers or chills.  Some nausea and occasional vomiting due to pain.  Reports she has not been keeping her stomach at this morning.   Past Medical History:  Diagnosis Date  . Abnormal vaginal Pap smear    10+ years ago- no colpo repeat was normal  . Anxiety   . AR (allergic rhinitis)   . Bipolar affective (HCC)    pt reported  . Bipolar disorder (HCC)   . Eating disorder    Under control per patient  . Fibromyalgia   . GERD (gastroesophageal reflux disease)   . Kidney stone   . Painful intercourse   . Painful menstrual periods   . Pelvic pain in female   . PTSD (post-traumatic stress disorder)   . Renal disorder   . Spinal stenosis   . Uterine polyp   . VWD (acquired von Willebrand's disease) [facility])     Patient Active Problem List   Diagnosis Date Noted  . Orthostasis 08/24/2016  . Antibiotic-induced yeast infection 08/22/2016  . Exercise-induced tachycardia 08/22/2016    . Constipation due to opioid therapy 11/04/2015  . Severe episode of recurrent major depressive disorder, without psychotic features (HCC)   . GERD (gastroesophageal reflux disease) 09/16/2015  . Smoker 07/25/2015  . Snoring 06/09/2015  . Cardiac murmur 05/25/2015  . Drug-induced nausea and vomiting 03/22/2015  . Cyst, bone 02/14/2015  . Pelvic adhesive disease 01/21/2015  . Arthritis 12/19/2014  . Anxiety disorder 12/13/2014  . Postural dizziness 12/13/2014  . Subcutaneous cyst 12/13/2014  . Nephrolithiasis 05/23/2012  . Chronic pelvic pain in female 05/23/2012  . Fibromyalgia 05/23/2012  . Bipolar disorder with depression (HCC) 06/05/2011    Past Surgical History:  Procedure Laterality Date  . ABDOMINAL HYSTERECTOMY    . CYSTOSCOPY WITH STENT PLACEMENT Left 04/19/2017   Procedure: CYSTOSCOPY WITH STENT PLACEMENT;  Surgeon: Crista Elliot, MD;  Location: [facility] ORS;  Service: Urology;  Laterality: Left;  . HYSTEROSCOPY     removed polyps  . LAPAROSCOPIC VAGINAL HYSTERECTOMY  2015   at [facility]  . LAPAROSCOPY Left 01/10/2015   Procedure: LAPAROSCOPY OPERATIVE with biopsy, left oopherectomy;  Surgeon: Herold Harms, MD;  Location: [facility] ORS;  Service: Gynecology;  Laterality: Left;  . LAPAROSCOPY ABDOMEN DIAGNOSTIC    . TUBAL LIGATION    . URETEROSCOPY WITH HOLMIUM LASER LITHOTRIPSY Left 04/19/2017   Procedure: URETEROSCOPY WITH HOLMIUM LASER LITHOTRIPSY;  Surgeon: Ray Church  III, MD;  Location: [facility] ORS;  Service: Urology;  Laterality: Left;    Prior to Admission medications   Medication Sig Start Date End Date Taking? Authorizing Provider  clonazePAM (KLONOPIN) 1 MG tablet Take 1 mg by mouth 2 (two) times daily. 12/18/17  Yes [provider]  valACYclovir (VALTREX) 500 MG tablet Take 1 tablet (500 mg total) by mouth daily. 11/13/17  Yes Doreene Burkehompson, Annie, CNM  zolpidem (AMBIEN) 10 MG tablet Take 1 tablet by mouth at bedtime as needed.  03/27/16  Yes  [provider]  ibuprofen (ADVIL,MOTRIN) 400 MG tablet Take 1 tablet (400 mg total) by mouth every 6 (six) hours as needed. Patient not taking: Reported on 01/04/2018 04/21/17   Dionne BucySiadecki, Sebastian, MD  promethazine (PHENERGAN) 25 MG tablet Take 1 tablet (25 mg total) by mouth every 8 (eight) hours as needed for nausea or vomiting. 01/04/18   Sharyn CreamerQuale, Vernel Langenderfer, MD  QUEtiapine (SEROQUEL) 200 MG tablet Take 200 mg by mouth daily. 04/15/17   [provider]  sulfamethoxazole-trimethoprim (BACTRIM DS,SEPTRA DS) 800-160 MG tablet Take 1 tablet by mouth 2 (two) times daily for 7 days. 01/04/18 01/11/18  Sharyn CreamerQuale, Lexianna Weinrich, MD  tiZANidine (ZANAFLEX) 2 MG tablet Take 1 tablet (2 mg total) by mouth every 6 (six) hours as needed for muscle spasms. Patient not taking: Reported on 01/04/2018 11/15/17   Cheryle HorsfallPoulose, Elizabeth E, NP    Allergies Wellbutrin [bupropion hcl]; Augmentin [amoxicillin-pot clavulanate]; Hydrocodone-acetaminophen; Lasix [furosemide]; and Nitrofurantoin monohyd macro  Family History  Problem Relation Age of Onset  . Hypertension Father   . Sleep apnea Mother   . Breast cancer Paternal Grandmother   . Diabetes Maternal Grandmother   . Breast cancer Maternal Grandmother   . Diabetes Maternal Aunt   . Diabetes Maternal Uncle     Social History Social History   Tobacco Use  . Smoking status: Current Every Day Smoker    Packs/day: 1.00    Types: Cigarettes    Start date: 05/18/1995  . Smokeless tobacco: Never Used  Substance Use Topics  . Alcohol use: No    Alcohol/week: 0.0 oz    Comment: Socially- seldom  . Drug use: No    Review of Systems Constitutional: No fever/chills Eyes: No visual changes. ENT: No sore throat. Cardiovascular: Denies chest pain. Respiratory: Denies shortness of breath. Gastrointestinal:  No diarrhea.  No constipation. Genitourinary: Negative for dysuria.  Prior hysterectomy. Musculoskeletal: Negative for back pain except it feels like a  deep-seated pain in her pelvis that radiates toward her back. Skin: Negative for rash. Neurological: Negative for headaches, focal weakness or numbness.    ____________________________________________   PHYSICAL EXAM:  VITAL SIGNS: ED Triage Vitals  Enc Vitals Group     BP 01/04/18 1504 133/65     Pulse Rate 01/04/18 1504 (!) 108     Resp 01/04/18 1504 16     Temp 01/04/18 1504 98.3 F (36.8 C)     Temp Source 01/04/18 1504 Oral     SpO2 01/04/18 1504 96 %     Weight 01/04/18 1503 136 lb (61.7 kg)     Height 01/04/18 1503 5\' 2"  (1.575 m)     Head Circumference --      Peak Flow --      Pain Score 01/04/18 1543 8     Pain Loc --      Pain Edu? --      Excl. in GC? --     Constitutional: Alert and oriented. Well appearing  and in no acute distress.  Appears in mild to moderate discomfort, seems uncomfortable seated in the bed. Eyes: Conjunctivae are normal. Head: Atraumatic. Nose: No congestion/rhinnorhea. Mouth/Throat: Mucous membranes are moist. Neck: No stridor.   Cardiovascular: Normal rate, regular rhythm. Grossly normal heart sounds.  Good peripheral circulation. Respiratory: Normal respiratory effort.  No retractions. Lungs CTAB. Gastrointestinal: Soft and nontender except she does report urgency to urinate and increase in discomfort to palpation across the lower abdomen.  No rebound or guarding.. No distention. Musculoskeletal: No lower extremity tenderness nor edema. Neurologic:  Normal speech and language. No gross focal neurologic deficits are appreciated.  Skin:  Skin is warm, dry and intact. No rash noted. Psychiatric: Mood and affect are normal. Speech and behavior are normal.  ____________________________________________   LABS (all labs ordered are listed, but only abnormal results are displayed)  Labs Reviewed  BASIC METABOLIC PANEL - Abnormal; Notable for the following components:      Result Value   CO2 20 (*)    Glucose, Bld 112 (*)    All other  components within normal limits  CBC - Abnormal; Notable for the following components:   RDW 15.1 (*)    All other components within normal limits  URINALYSIS, COMPLETE (UACMP) WITH MICROSCOPIC - Abnormal; Notable for the following components:   Color, Urine YELLOW (*)    APPearance CLEAR (*)    Hgb urine dipstick SMALL (*)    Ketones, ur 5 (*)    Protein, ur 30 (*)    Bacteria, UA RARE (*)    All other components within normal limits  URINE CULTURE  POC URINE PREG, ED  POCT PREGNANCY, URINE   ____________________________________________  EKG   ____________________________________________  RADIOLOGY  Ct Renal Stone Study  Result Date: 01/04/2018 CLINICAL DATA:  Low back pain/kidney pain for 1 day. Dysuria. History kidney stones and hysterectomy. EXAM: CT ABDOMEN AND PELVIS WITHOUT CONTRAST TECHNIQUE: Multidetector CT imaging of the abdomen and pelvis was performed following the standard protocol without IV contrast. COMPARISON:  04/21/2017 FINDINGS: Lower chest: Clear lung bases. Normal heart size without pericardial or pleural effusion. Hepatobiliary: Focal steatosis adjacent the falciform ligament. Normal gallbladder, without biliary ductal dilatation. Pancreas: Normal, without mass or ductal dilatation. Spleen: Normal in size, without focal abnormality. Adrenals/Urinary Tract: Normal adrenal glands. No renal calculi or hydronephrosis. An anterior interpolar left renal lesion is low-density, most consistent with a cyst. On the order of 12 mm. No hydroureter or ureteric calculi. No bladder calculi. There is a locule of air in the anterior nondependent bladder. Stomach/Bowel: Normal stomach, without wall thickening. Normal colon, appendix, and terminal ileum. Normal small bowel. Vascular/Lymphatic: Normal caliber of the aorta and branch vessels. No abdominopelvic adenopathy. Reproductive: Hysterectomy.  No adnexal mass. Other: No significant free fluid.  No free intraperitoneal air.  Musculoskeletal: No acute osseous abnormality. IMPRESSION: 1.  No urinary tract calculi or hydronephrosis. 2. Air in the urinary bladder could be iatrogenic. Correlate with instrumentation. Electronically Signed   By: Jeronimo Greaves M.D.   On: 01/04/2018 16:34     ____________________________________________   PROCEDURES  Procedure(s) performed: None  Procedures  Critical Care performed: No  ____________________________________________   INITIAL IMPRESSION / ASSESSMENT AND PLAN / ED COURSE  Pertinent labs & imaging results that were available during my care of the patient were reviewed by me and considered in my medical decision making (see chart for details).  Differential diagnosis includes but is not limited to, abdominal perforation, aortic dissection, cholecystitis, appendicitis,  diverticulitis, colitis, esophagitis/gastritis, kidney stone, pyelonephritis, urinary tract infection, aortic aneurysm. All are considered in decision and treatment plan. Based upon the patient's presentation and risk factors, and given the patient's previous history and symptoms today that seem very urinary in nature I suspect likely urinary tract infection, recurrent kidney stone, bladder or ureteral spasm etc.  Denies gynecologic symptoms.  Previous hysterectomy.   Clinical Course as of Jan 05 1836  Sat Jan 04, 2018  1721 Resting comfortably.  Patient reports pain has improved, some ongoing nausea.  Drinking water at present without difficulty.   [MQ]    Clinical Course User Index [MQ] Sharyn Creamer, MD   ----------------------------------------- 5:13 PM on 01/04/2018 -----------------------------------------  Case and care including vitals, clinical presentation, lab work and urinalysis as well as CT findings including a small pocket of gas suspected in the bladder discussed with Dr. Ronne Binning of urology.  After discussing and reviewing urinalysis and patient symptoms he reports he suspect likely a  small infection is causing and responsible for this, recommends treatment with Bactrim specifically 1 tablet twice daily for 7 days and close outpatient follow-up with urology.  ----------------------------------------- 6:35 PM on 01/04/2018 -----------------------------------------  Resting comfortably.  Patient reports pain and nausea much better.  She appears well.  Appears appropriate for ongoing outpatient therapy.  Prescribed antibiotic, Bactrim and antiemetic.  Follow-up with primary neurology advised.  Patient reports mother will be coming to pick her up, not driving herself. Return precautions and treatment recommendations and follow-up discussed with the patient who is agreeable with the plan.   ____________________________________________   FINAL CLINICAL IMPRESSION(S) / ED DIAGNOSES  Final diagnoses:  Urinary tract infection, acute      NEW MEDICATIONS STARTED DURING THIS VISIT:  New Prescriptions   PROMETHAZINE (PHENERGAN) 25 MG TABLET    Take 1 tablet (25 mg total) by mouth every 8 (eight) hours as needed for nausea or vomiting.   SULFAMETHOXAZOLE-TRIMETHOPRIM (BACTRIM DS,SEPTRA DS) 800-160 MG TABLET    Take 1 tablet by mouth 2 (two) times daily for 7 days.     Note:  This document was prepared using Dragon voice recognition software and may include unintentional dictation errors.     Sharyn Creamer, MD 01/04/18 [phone]

## 2018-01-04 NOTE — ED Notes (Signed)
Pt given warm blanket.

## 2018-01-04 NOTE — ED Notes (Signed)
Went to room to assess pt and found her sitting in the floor by the toilet vomiting - the pt states that she did not fall that she sat down on purpose - assisted back to bed

## 2018-01-04 NOTE — Discharge Instructions (Signed)

## 2018-01-04 NOTE — ED Triage Notes (Signed)
C/O low back "kidney pain" x 1 day.  Patient states she was at the beach and she came home early due to not feel ing well.  Also c/o several day history of "having to strain to push urine out".  Has history of kidney stones.

## 2018-01-07 LAB — URINE CULTURE
Culture: >=100000 — AB
Special Requests: NORMAL

## 2018-01-08 DIAGNOSIS — F3181 Bipolar II disorder: Secondary | ICD-10-CM | POA: Diagnosis not present

## 2018-01-08 DIAGNOSIS — F4310 Post-traumatic stress disorder, unspecified: Secondary | ICD-10-CM | POA: Diagnosis not present

## 2018-01-08 DIAGNOSIS — F633 Trichotillomania: Secondary | ICD-10-CM | POA: Diagnosis not present

## 2018-01-08 NOTE — Progress Notes (Signed)
ED Antimicrobial Stewardship Positive Culture Follow Up   Casey Hodges is an [age] y.o. female who presented to [facility] on 01/04[facility]2018 with a chief complaint of  Chief Complaint  Patient presents with  . Back Pain    Recent Results (from the past 720 hour(s))  Urine Culture     Status: Abnormal   Collection Time: 01/04/18  3:52 PM  Result Value Ref Range Status   Specimen Description   Final    URINE, CLEAN CATCH Performed at [facility], [address] [place], [place] [address]    Special Requests   Final    Normal Performed at [facility], [address] [place], [place] [address]    Culture (A)  Final    >=100,000 COLONIES/mL ESCHERICHIA COLI 50,000 COLONIES/mL ENTEROCOCCUS FAECALIS    Report Status 01/07/2018 FINAL  Final   Organism ID, Bacteria ESCHERICHIA COLI (A)  Final   Organism ID, Bacteria ENTEROCOCCUS FAECALIS (A)  Final      Susceptibility   Escherichia coli - MIC*    AMPICILLIN 8 SENSITIVE Sensitive     CEFAZOLIN <=4 SENSITIVE Sensitive     CEFTRIAXONE <=1 SENSITIVE Sensitive     CIPROFLOXACIN <=0.25 SENSITIVE Sensitive     GENTAMICIN <=1 SENSITIVE Sensitive     IMIPENEM <=0.25 SENSITIVE Sensitive     NITROFURANTOIN <=16 SENSITIVE Sensitive     TRIMETH/SULFA <=20 SENSITIVE Sensitive     AMPICILLIN/SULBACTAM 4 SENSITIVE Sensitive     PIP/TAZO <=4 SENSITIVE Sensitive     Extended ESBL NEGATIVE Sensitive     * >=100,000 COLONIES/mL ESCHERICHIA COLI   Enterococcus faecalis - MIC*    AMPICILLIN <=2 SENSITIVE Sensitive     LEVOFLOXACIN 1 SENSITIVE Sensitive     NITROFURANTOIN <=16 SENSITIVE Sensitive     VANCOMYCIN 2 SENSITIVE Sensitive     * 50,000 COLONIES/mL ENTEROCOCCUS FAECALIS    [x]  Treated with bactrim, organism (enterococcus faecalis) resistant to prescribed antimicrobial []  Patient discharged originally without antimicrobial agent and treatment is now indicated  New antibiotic prescription: Cipro 500mg  BID x 7 days  ED  Provider: Dr. Robb MatarQuale  Called pt preferred phone #- someone answered who stated that [name] was not available, I left name and call back # If pt does not call back today, will try again tomorrow  Olene FlossMelissa D Maccia, Pharm.D, BCPS Clinical Pharmacist 01/08/2018, 3:19 PM

## 2018-01-09 NOTE — Progress Notes (Signed)
ED Antimicrobial Stewardship Positive Culture Follow Up   Casey Hodges is an 42 y.o. female who presented to [facility] on 01/04/2018 with a chief complaint of  Chief Complaint  Patient presents with  . Back Pain    Recent Results (from the past 720 hour(s))  Urine Culture     Status: Abnormal   Collection Time: 01/04/18  3:52 PM  Result Value Ref Range Status   Specimen Description   Final    URINE, CLEAN CATCH Performed at [facility], [address] [place], [place] [address]    Special Requests   Final    Normal Performed at [facility], [address] [place], [place] [address]    Culture (A)  Final    >=100,000 COLONIES/mL ESCHERICHIA COLI 50,000 COLONIES/mL ENTEROCOCCUS FAECALIS    Report Status 01/07/2018 FINAL  Final   Organism ID, Bacteria ESCHERICHIA COLI (A)  Final   Organism ID, Bacteria ENTEROCOCCUS FAECALIS (A)  Final      Susceptibility   Escherichia coli - MIC*    AMPICILLIN 8 SENSITIVE Sensitive     CEFAZOLIN <=4 SENSITIVE Sensitive     CEFTRIAXONE <=1 SENSITIVE Sensitive     CIPROFLOXACIN <=0.25 SENSITIVE Sensitive     GENTAMICIN <=1 SENSITIVE Sensitive     IMIPENEM <=0.25 SENSITIVE Sensitive     NITROFURANTOIN <=16 SENSITIVE Sensitive     TRIMETH/SULFA <=20 SENSITIVE Sensitive     AMPICILLIN/SULBACTAM 4 SENSITIVE Sensitive     PIP/TAZO <=4 SENSITIVE Sensitive     Extended ESBL NEGATIVE Sensitive     * >=100,000 COLONIES/mL ESCHERICHIA COLI   Enterococcus faecalis - MIC*    AMPICILLIN <=2 SENSITIVE Sensitive     LEVOFLOXACIN 1 SENSITIVE Sensitive     NITROFURANTOIN <=16 SENSITIVE Sensitive     VANCOMYCIN 2 SENSITIVE Sensitive     * 50,000 COLONIES/mL ENTEROCOCCUS FAECALIS    [x]  Treated with bactrim, organism (enterococcus faecalis) resistant to prescribed antimicrobial []  Patient discharged originally without antimicrobial agent and treatment is now indicated  New antibiotic prescription: Cipro 500mg  BID x 7 days  ED  Provider: Dr. Robb MatarQuale  Called pt preferred phone #- someone answered who stated that [name] was not available, I left name and call back # If pt does not call back today, will try again tomorrow  01/09/18 2nd attempt: Called pt phone # [phone] and patient answered. Patient's preferred [facility] [address] [facility] [address] [phone]. Called in prescription for above antibiotic. Discussed with patient to stop taking Bactrim.  Bari MantisKristin Timarie Labell PharmD Clinical Pharmacist 01/09/2018

## 2018-01-10 ENCOUNTER — Emergency Department
Admission: EM | Admit: 2018-01-10 | Discharge: 2018-01-10 | Disposition: A | Discharge status: Home / Self Care | Payer: Medicare Other | Attending: Emergency Medicine | Admitting: Emergency Medicine

## 2018-01-10 ENCOUNTER — Encounter: Payer: Self-pay | Admitting: Emergency Medicine

## 2018-01-10 ENCOUNTER — Other Ambulatory Visit: Payer: Self-pay

## 2018-01-10 DIAGNOSIS — F17210 Nicotine dependence, cigarettes, uncomplicated: Secondary | ICD-10-CM | POA: Diagnosis not present

## 2018-01-10 DIAGNOSIS — Z79899 Other long term (current) drug therapy: Secondary | ICD-10-CM | POA: Diagnosis not present

## 2018-01-10 DIAGNOSIS — R112 Nausea with vomiting, unspecified: Secondary | ICD-10-CM | POA: Diagnosis not present

## 2018-01-10 DIAGNOSIS — E860 Dehydration: Secondary | ICD-10-CM

## 2018-01-10 DIAGNOSIS — R000 Tachycardia, unspecified: Secondary | ICD-10-CM | POA: Insufficient documentation

## 2018-01-10 DIAGNOSIS — N3000 Acute cystitis without hematuria: Secondary | ICD-10-CM

## 2018-01-10 MED ORDER — SODIUM CHLORIDE 0.9 % IV SOLN
1.0000 g | Freq: Once | INTRAVENOUS | Status: AC
  Administered 2018-01-10: 1 g via INTRAVENOUS
  Filled 2018-01-10: qty 10

## 2018-01-10 MED ORDER — CEPHALEXIN 500 MG PO CAPS: 500.0000 mg | ORAL_CAPSULE | Freq: Four times a day (QID) | ORAL | 0 refills | Status: AC

## 2018-01-10 MED ORDER — ONDANSETRON 4 MG PO TBDP: 4.0000 mg | ORAL_TABLET | Freq: Three times a day (TID) | ORAL | 0 refills | Status: DC | PRN

## 2018-01-10 MED ORDER — SODIUM CHLORIDE 0.9 % IV BOLUS
1000.0000 mL | Freq: Once | INTRAVENOUS | Status: AC
  Administered 2018-01-10: 1000 mL via INTRAVENOUS

## 2018-01-10 MED ORDER — PROCHLORPERAZINE EDISYLATE 10 MG/2ML IJ SOLN
10.0000 mg | Freq: Once | INTRAMUSCULAR | Status: AC
  Administered 2018-01-10: 10 mg via INTRAVENOUS
  Filled 2018-01-10: qty 2

## 2018-01-10 NOTE — Discharge Instructions (Signed)
Please do not take Bactrim or ciprofloxacin for this urinary tract infection.  Take Keflex for the next 3 days and follow-up with your primary care physician as needed.  Return to the emergency department sooner for any concerns.  It was a pleasure to take care of you today, and thank you for coming to our emergency department.  If you have any questions or concerns before leaving please ask the nurse to grab me and I'm more than happy to go through your aftercare instructions again.  If you were prescribed any opioid pain medication today such as Norco, Vicodin, Percocet, morphine, hydrocodone, or oxycodone please make sure you do not drive when you are taking this medication as it can alter your ability to drive safely.  If you have any concerns once you are home that you are not improving or are in fact getting worse before you can make it to your follow-up appointment, please do not hesitate to call 911 and come back for further evaluation.  Merrily BrittleNeil Roselinda Bahena, MD  Results for orders placed or performed during the hospital encounter of 01/04/18  Urine Culture  Result Value Ref Range   Specimen Description      URINE, CLEAN CATCH Performed at [facility], [address] [place], [place] [address]    Special Requests      Normal Performed at [facility], [address] [place], [place] [address]    Culture (A)     >=100,000 COLONIES/mL ESCHERICHIA COLI 50,000 COLONIES/mL ENTEROCOCCUS FAECALIS    Report Status 01/07/2018 FINAL    Organism ID, Bacteria ESCHERICHIA COLI (A)    Organism ID, Bacteria ENTEROCOCCUS FAECALIS (A)       Susceptibility   Escherichia coli - MIC*    AMPICILLIN 8 SENSITIVE Sensitive     CEFAZOLIN <=4 SENSITIVE Sensitive     CEFTRIAXONE <=1 SENSITIVE Sensitive     CIPROFLOXACIN <=0.25 SENSITIVE Sensitive     GENTAMICIN <=1 SENSITIVE Sensitive     IMIPENEM <=0.25 SENSITIVE Sensitive     NITROFURANTOIN <=16 SENSITIVE Sensitive    TRIMETH/SULFA <=20 SENSITIVE Sensitive     AMPICILLIN/SULBACTAM 4 SENSITIVE Sensitive     PIP/TAZO <=4 SENSITIVE Sensitive     Extended ESBL NEGATIVE Sensitive     * >=100,000 COLONIES/mL ESCHERICHIA COLI   Enterococcus faecalis - MIC*    AMPICILLIN <=2 SENSITIVE Sensitive     LEVOFLOXACIN 1 SENSITIVE Sensitive     NITROFURANTOIN <=16 SENSITIVE Sensitive     VANCOMYCIN 2 SENSITIVE Sensitive     * 50,000 COLONIES/mL ENTEROCOCCUS FAECALIS  Basic metabolic panel  Result Value Ref Range   Sodium 143 135 - 145 mmol/L   Potassium 3.5 3.5 - 5.1 mmol/L   Chloride 111 98 - 111 mmol/L   CO2 20 (L) 22 - 32 mmol/L   Glucose, Bld 112 (H) 70 - 99 mg/dL   BUN 6 6 - 20 mg/dL   Creatinine, Ser 0.980.58 0.44 - 1.00 mg/dL   Calcium 9.2 8.9 - 11.910.3 mg/dL   GFR calc non Af Amer >60 >60 mL/min   GFR calc Af Amer >60 >60 mL/min   Anion gap 12 5 - 15  CBC  Result Value Ref Range   WBC 6.9 3.6 - 11.0 K/uL   RBC 4.59 3.80 - 5.20 MIL/uL   Hemoglobin 14.4 12.0 - 16.0 g/dL   HCT 14.742.3 82.935.0 - 56.247.0 %   MCV 92.1 80.0 - 100.0 fL   MCH 31.4 26.0 - 34.0 pg  MCHC 34.1 32.0 - 36.0 g/dL   RDW 16.1 (H) 09.6 - 04.5 %   Platelets 255 150 - 440 K/uL  Urinalysis, Complete w Microscopic  Result Value Ref Range   Color, Urine YELLOW (A) YELLOW   APPearance CLEAR (A) CLEAR   Specific Gravity, Urine 1.023 1.005 - 1.030   pH 6.0 5.0 - 8.0   Glucose, UA NEGATIVE NEGATIVE mg/dL   Hgb urine dipstick SMALL (A) NEGATIVE   Bilirubin Urine NEGATIVE NEGATIVE   Ketones, ur 5 (A) NEGATIVE mg/dL   Protein, ur 30 (A) NEGATIVE mg/dL   Nitrite NEGATIVE NEGATIVE   Leukocytes, UA NEGATIVE NEGATIVE   RBC / HPF 6-10 0 - 5 RBC/hpf   WBC, UA 0-5 0 - 5 WBC/hpf   Bacteria, UA RARE (A) NONE SEEN   Squamous Epithelial / LPF 6-10 0 - 5   Mucus PRESENT   Pregnancy, urine POC  Result Value Ref Range   Preg Test, Ur NEGATIVE NEGATIVE   Ct Renal Stone Study  Result Date: 01/04/2018 CLINICAL DATA:  Low back pain/kidney pain for 1 day.  Dysuria. History kidney stones and hysterectomy. EXAM: CT ABDOMEN AND PELVIS WITHOUT CONTRAST TECHNIQUE: Multidetector CT imaging of the abdomen and pelvis was performed following the standard protocol without IV contrast. COMPARISON:  04/21/2017 FINDINGS: Lower chest: Clear lung bases. Normal heart size without pericardial or pleural effusion. Hepatobiliary: Focal steatosis adjacent the falciform ligament. Normal gallbladder, without biliary ductal dilatation. Pancreas: Normal, without mass or ductal dilatation. Spleen: Normal in size, without focal abnormality. Adrenals/Urinary Tract: Normal adrenal glands. No renal calculi or hydronephrosis. An anterior interpolar left renal lesion is low-density, most consistent with a cyst. On the order of 12 mm. No hydroureter or ureteric calculi. No bladder calculi. There is a locule of air in the anterior nondependent bladder. Stomach/Bowel: Normal stomach, without wall thickening. Normal colon, appendix, and terminal ileum. Normal small bowel. Vascular/Lymphatic: Normal caliber of the aorta and branch vessels. No abdominopelvic adenopathy. Reproductive: Hysterectomy.  No adnexal mass. Other: No significant free fluid.  No free intraperitoneal air. Musculoskeletal: No acute osseous abnormality. IMPRESSION: 1.  No urinary tract calculi or hydronephrosis. 2. Air in the urinary bladder could be iatrogenic. Correlate with instrumentation. Electronically Signed   By: Jeronimo Greaves M.D.   On: 01/04/2018 16:34

## 2018-01-10 NOTE — ED Notes (Signed)
Patient given gingerale and graham crackers while fluids finish.

## 2018-01-10 NOTE — ED Provider Notes (Signed)
[facility] Emergency Department Provider Note  ____________________________________________   First MD Initiated Contact with Patient 01/10/18 0113     (approximate)  I have reviewed the triage vital signs and the nursing notes.   HISTORY  Chief Complaint Emesis    HPI Casey Hodges is a 42 y.o. female who comes to the emergency department with nausea and vomiting for the past several hours and inability to keep food and water down.  5 days ago she was seen in our emergency department and had a CT scan for presumptive kidney stone.  At that point she had back pain and dysuria.  CT scan was negative for stone but did show gas in the bladder.  Dr. Fanny BienQuale prescribed Bactrim and discharged the patient home.  Today the patient was called back at her urinalysis was positive for pansensitive E. coli as well as enterococcus.  Unclear who followed up on the culture results but apparently the patient was called back and was switched to ciprofloxacin.  She took her first lifetime dose of Cipro and subsequently went to the gym.  Shortly thereafter she began to vomit and could not keep anything down.  She called back the hospital pharmacy who advised her to come to the emergency department for IV fluids.  She took 2 doses of Phenergan at home and vomited them back up.  She did not drive here today.  Her symptoms came on suddenly and are severe.  They are worsened by trying to eat and nothing seems to make them better.  She no longer has any back pain or dysuria.  No fevers or chills.   Past Medical History:  Diagnosis Date  . Abnormal vaginal Pap smear    10+ years ago- no colpo repeat was normal  . Anxiety   . AR (allergic rhinitis)   . Bipolar affective (HCC)    pt reported  . Bipolar disorder (HCC)   . Eating disorder    Under control per patient  . Fibromyalgia   . GERD (gastroesophageal reflux disease)   . Kidney stone   . Painful intercourse   . Painful  menstrual periods   . Pelvic pain in female   . PTSD (post-traumatic stress disorder)   . Renal disorder   . Spinal stenosis   . Uterine polyp   . VWD (acquired von Willebrand's disease) [facility](HCC)     Patient Active Problem List   Diagnosis Date Noted  . Orthostasis 08/24/2016  . Antibiotic-induced yeast infection 08/22/2016  . Exercise-induced tachycardia 08/22/2016  . Constipation due to opioid therapy 11/04/2015  . Severe episode of recurrent major depressive disorder, without psychotic features (HCC)   . GERD (gastroesophageal reflux disease) 09/16/2015  . Smoker 07/25/2015  . Snoring 06/09/2015  . Cardiac murmur 05/25/2015  . Drug-induced nausea and vomiting 03/22/2015  . Cyst, bone 02/14/2015  . Pelvic adhesive disease 01/21/2015  . Arthritis 12/19/2014  . Anxiety disorder 12/13/2014  . Postural dizziness 12/13/2014  . Subcutaneous cyst 12/13/2014  . Nephrolithiasis 05/23/2012  . Chronic pelvic pain in female 05/23/2012  . Fibromyalgia 05/23/2012  . Bipolar disorder with depression (HCC) 06/05/2011    Past Surgical History:  Procedure Laterality Date  . ABDOMINAL HYSTERECTOMY    . CYSTOSCOPY WITH STENT PLACEMENT Left 04/19/2017   Procedure: CYSTOSCOPY WITH STENT PLACEMENT;  Surgeon: Crista ElliotBell, Eugene D III, MD;  Location: [facility] ORS;  Service: Urology;  Laterality: Left;  . HYSTEROSCOPY     removed polyps  . LAPAROSCOPIC  VAGINAL HYSTERECTOMY  2015   at [facility]  . LAPAROSCOPY Left 01/10/2015   Procedure: LAPAROSCOPY OPERATIVE with biopsy, left oopherectomy;  Surgeon: Herold Harms, MD;  Location: [facility] ORS;  Service: Gynecology;  Laterality: Left;  . LAPAROSCOPY ABDOMEN DIAGNOSTIC    . TUBAL LIGATION    . URETEROSCOPY WITH HOLMIUM LASER LITHOTRIPSY Left 04/19/2017   Procedure: URETEROSCOPY WITH HOLMIUM LASER LITHOTRIPSY;  Surgeon: Crista Elliot, MD;  Location: [facility] ORS;  Service: Urology;  Laterality: Left;    Prior to Admission medications   Medication Sig  Start Date End Date Taking? Authorizing Provider  cephALEXin (KEFLEX) 500 MG capsule Take 1 capsule (500 mg total) by mouth 4 (four) times daily for 3 days. 01/10/18 01/13/18  Merrily Brittle, MD  clonazePAM (KLONOPIN) 1 MG tablet Take 1 mg by mouth 2 (two) times daily. 12/18/17   [provider]  ibuprofen (ADVIL,MOTRIN) 400 MG tablet Take 1 tablet (400 mg total) by mouth every 6 (six) hours as needed. Patient not taking: Reported on 01/04/2018 04/21/17   Dionne Bucy, MD  ondansetron (ZOFRAN ODT) 4 MG disintegrating tablet Take 1 tablet (4 mg total) by mouth every 8 (eight) hours as needed for nausea or vomiting. 01/10/18   Merrily Brittle, MD  promethazine (PHENERGAN) 25 MG tablet Take 1 tablet (25 mg total) by mouth every 8 (eight) hours as needed for nausea or vomiting. 01/04/18   Sharyn Creamer, MD  QUEtiapine (SEROQUEL) 200 MG tablet Take 200 mg by mouth daily. 04/15/17   [provider]  sulfamethoxazole-trimethoprim (BACTRIM DS,SEPTRA DS) 800-160 MG tablet Take 1 tablet by mouth 2 (two) times daily for 7 days. 01/04/18 01/11/18  Sharyn Creamer, MD  tiZANidine (ZANAFLEX) 2 MG tablet Take 1 tablet (2 mg total) by mouth every 6 (six) hours as needed for muscle spasms. Patient not taking: Reported on 01/04/2018 11/15/17   Cheryle Horsfall, NP  valACYclovir (VALTREX) 500 MG tablet Take 1 tablet (500 mg total) by mouth daily. 11/13/17   Doreene Burke, CNM  zolpidem (AMBIEN) 10 MG tablet Take 1 tablet by mouth at bedtime as needed.  03/27/16   [provider]    Allergies Wellbutrin [bupropion hcl]; Augmentin [amoxicillin-pot clavulanate]; Hydrocodone-acetaminophen; Lasix [furosemide]; and Nitrofurantoin monohyd macro  Family History  Problem Relation Age of Onset  . Hypertension Father   . Sleep apnea Mother   . Breast cancer Paternal Grandmother   . Diabetes Maternal Grandmother   . Breast cancer Maternal Grandmother   . Diabetes Maternal Aunt   . Diabetes Maternal  Uncle     Social History Social History   Tobacco Use  . Smoking status: Current Every Day Smoker    Packs/day: 1.00    Types: Cigarettes    Start date: 05/18/1995  . Smokeless tobacco: Never Used  Substance Use Topics  . Alcohol use: No    Alcohol/week: 0.0 oz    Comment: Socially- seldom  . Drug use: No    Review of Systems Constitutional: No fever/chills Eyes: No visual changes. ENT: No sore throat. Cardiovascular: Denies chest pain. Respiratory: Denies shortness of breath. Gastrointestinal: No abdominal pain.  Positive for nausea, positive for vomiting.  No diarrhea.  No constipation. Genitourinary: Negative for dysuria. Musculoskeletal: Negative for back pain. Skin: Negative for rash. Neurological: Negative for headaches, focal weakness or numbness.   ____________________________________________   PHYSICAL EXAM:  VITAL SIGNS: ED Triage Vitals  Enc Vitals Group     BP      Pulse  Resp      Temp      Temp src      SpO2      Weight      Height      Head Circumference      Peak Flow      Pain Score      Pain Loc      Pain Edu?      Excl. in GC?     Constitutional: Alert and oriented x4 appears obviously uncomfortable and nauseated Eyes: PERRL EOMI. Head: Atraumatic. Nose: No congestion/rhinnorhea. Mouth/Throat: No trismus Neck: No stridor.   Cardiovascular: Tachycardic rate, regular rhythm. Grossly normal heart sounds.  Good peripheral circulation. Respiratory: Normal respiratory effort.  No retractions. Lungs CTAB and moving good air Gastrointestinal: Soft mild diffuse tenderness with no focality no rebound or guarding or peritonitis no costovertebral tenderness Musculoskeletal: No lower extremity edema   Neurologic:  Normal speech and language. No gross focal neurologic deficits are appreciated. Skin:  Skin is warm, dry and intact. No rash noted. Psychiatric: Slightly anxious appearing   ____________________________________________     DIFFERENTIAL includes but not limited to  UTI, pyelonephritis, dehydration, allergic reaction ____________________________________________   LABS (all labs ordered are listed, but only abnormal results are displayed)  Labs Reviewed - No data to display   __________________________________________  EKG   ____________________________________________  RADIOLOGY   ____________________________________________   PROCEDURES  Procedure(s) performed: no  Procedures  Critical Care performed: no  ____________________________________________   INITIAL IMPRESSION / ASSESSMENT AND PLAN / ED COURSE  Pertinent labs & imaging results that were available during my care of the patient were reviewed by me and considered in my medical decision making (see chart for details).   The patient arrives tachycardic and somewhat anxious appearing.  I reviewed the patient's recent urine cultures which are positive for E. coli and enterococcus.  Both should respond to a dose of ceftriaxone and both are sensitive to nitrofurantoin.  She is afebrile with no costovertebral tenderness and no evidence of pyelonephritis at this time.  I do think is reasonable to give her a liter of IV fluids and IV Compazine for nausea and in the meantime we can treat her with ceftriaxone to cover any remnant of UTI remaining.  Anticipate discharge home.     Following ceftriaxone and Compazine the patient feels improved.  I will discharge her home with a short course of Keflex given her possible allergy to Macrobid.  She is medically stable for outpatient management. ____________________________________________   FINAL CLINICAL IMPRESSION(S) / ED DIAGNOSES  Final diagnoses:  Non-intractable vomiting with nausea, unspecified vomiting type  Dehydration  Acute cystitis without hematuria      NEW MEDICATIONS STARTED DURING THIS VISIT:  Discharge Medication List as of 01/10/2018  1:38 AM    START taking these  medications   Details  cephALEXin (KEFLEX) 500 MG capsule Take 1 capsule (500 mg total) by mouth 4 (four) times daily for 3 days., Starting Fri 01/10/2018, Until Mon 01/13/2018, Print    ondansetron (ZOFRAN ODT) 4 MG disintegrating tablet Take 1 tablet (4 mg total) by mouth every 8 (eight) hours as needed for nausea or vomiting., Starting Fri 01/10/2018, Print         Note:  This document was prepared using Dragon voice recognition software and may include unintentional dictation errors.     Merrily Brittle, MD 01/11/18 [phone]

## 2018-01-10 NOTE — ED Triage Notes (Signed)
Pt states she has taken Bactrim for a few days, was switched to Cipro today after receiving a call from ED, took first pill at 1900 this evening, states she has been vomiting and dry heaving ever since. Was seen here on 7/13 and diagnosed with bladder infection. Has tried to take 2 PO Phenergan tonight however, unable to keep pills down. Appears in NAD.

## 2018-01-24 DIAGNOSIS — F3181 Bipolar II disorder: Secondary | ICD-10-CM | POA: Diagnosis not present

## 2018-01-24 DIAGNOSIS — F4310 Post-traumatic stress disorder, unspecified: Secondary | ICD-10-CM | POA: Diagnosis not present

## 2018-01-24 DIAGNOSIS — F633 Trichotillomania: Secondary | ICD-10-CM | POA: Diagnosis not present

## 2018-02-10 DIAGNOSIS — F4310 Post-traumatic stress disorder, unspecified: Secondary | ICD-10-CM | POA: Diagnosis not present

## 2018-02-10 DIAGNOSIS — F3181 Bipolar II disorder: Secondary | ICD-10-CM | POA: Diagnosis not present

## 2018-02-10 DIAGNOSIS — F633 Trichotillomania: Secondary | ICD-10-CM | POA: Diagnosis not present

## 2018-02-12 DIAGNOSIS — F4310 Post-traumatic stress disorder, unspecified: Secondary | ICD-10-CM | POA: Diagnosis not present

## 2018-02-12 DIAGNOSIS — F633 Trichotillomania: Secondary | ICD-10-CM | POA: Diagnosis not present

## 2018-02-12 DIAGNOSIS — F3181 Bipolar II disorder: Secondary | ICD-10-CM | POA: Diagnosis not present

## 2018-02-14 ENCOUNTER — Telehealth: Payer: Self-pay | Admitting: Emergency Medicine

## 2018-02-14 NOTE — Telephone Encounter (Signed)
Copied from CRM [id number]. Topic: General - Other >> Feb 14, 2018 10:48 AM Tamela OddiHarris, Brenda J wrote: Reason for CRM: Patient called to request a prescription for a yeast infection be called in.  Patient stated that she has been taking an antibiotic which has caused the yeast infection.  Patient does not want an appointment but does want medication to clear up the infection.  CB# [phone].

## 2018-02-14 NOTE — Telephone Encounter (Signed)
E advise

## 2018-02-15 NOTE — Telephone Encounter (Signed)
We'll recommend one of the following: 1. over-the-counter options (she can talk to her pharmacist) 2. MyChart e-visit 3. urgent care 4. visit here

## 2018-02-27 DIAGNOSIS — F4310 Post-traumatic stress disorder, unspecified: Secondary | ICD-10-CM | POA: Diagnosis not present

## 2018-02-27 DIAGNOSIS — M5416 Radiculopathy, lumbar region: Secondary | ICD-10-CM | POA: Diagnosis not present

## 2018-02-27 DIAGNOSIS — F633 Trichotillomania: Secondary | ICD-10-CM | POA: Diagnosis not present

## 2018-02-27 DIAGNOSIS — F3181 Bipolar II disorder: Secondary | ICD-10-CM | POA: Diagnosis not present

## 2018-02-27 DIAGNOSIS — M7070 Other bursitis of hip, unspecified hip: Secondary | ICD-10-CM | POA: Diagnosis not present

## 2018-03-10 ENCOUNTER — Ambulatory Visit: Payer: Self-pay | Admitting: Family Medicine

## 2018-03-10 NOTE — Telephone Encounter (Signed)
Pt is having hip pain that is radiating. Pt is having trouble walking. Pt unable to come in today. Pt has an appt for tomorrow with elizabeth poulose at 3 pm. Pt unable to come in mornings also. Please advise  Mailbox full- unable to leave message

## 2018-03-10 NOTE — Telephone Encounter (Signed)
Attempted to call pt x 2 to discuss symptoms of hip pain. Unable to leave message due to mailbox being full. Pt scheduled for appt with Rodena GoldmannElizabeth Poulose,NP on 9/17 at 3 pm.

## 2018-03-11 ENCOUNTER — Encounter: Payer: Self-pay | Admitting: Nurse Practitioner

## 2018-03-11 ENCOUNTER — Ambulatory Visit: Payer: Self-pay | Admitting: Nurse Practitioner

## 2018-03-11 ENCOUNTER — Ambulatory Visit (INDEPENDENT_AMBULATORY_CARE_PROVIDER_SITE_OTHER): Payer: Medicare Other | Admitting: Nurse Practitioner

## 2018-03-11 VITALS — BP 92/58 | HR 100 | Temp 98.6°F | Resp 16 | Ht 65.0 in | Wt 136.4 lb

## 2018-03-11 DIAGNOSIS — F17210 Nicotine dependence, cigarettes, uncomplicated: Secondary | ICD-10-CM | POA: Diagnosis not present

## 2018-03-11 DIAGNOSIS — Z598 Other problems related to housing and economic circumstances: Secondary | ICD-10-CM | POA: Diagnosis not present

## 2018-03-11 DIAGNOSIS — Z716 Tobacco abuse counseling: Secondary | ICD-10-CM

## 2018-03-11 DIAGNOSIS — M25552 Pain in left hip: Secondary | ICD-10-CM

## 2018-03-11 DIAGNOSIS — Z599 Problem related to housing and economic circumstances, unspecified: Secondary | ICD-10-CM

## 2018-03-11 MED ORDER — NICOTINE 21 MG/24HR TD PT24: 21.0000 mg | MEDICATED_PATCH | Freq: Every day | TRANSDERMAL | 0 refills | Status: AC

## 2018-03-11 MED ORDER — NAPROXEN 250 MG PO TABS: 500.0000 mg | ORAL_TABLET | Freq: Two times a day (BID) | ORAL | 0 refills | Status: AC

## 2018-03-11 MED ORDER — KETOROLAC TROMETHAMINE 30 MG/ML IJ SOLN
30.0000 mg | Freq: Once | INTRAMUSCULAR | Status: AC
  Administered 2018-03-11: 30 mg via INTRAMUSCULAR

## 2018-03-11 MED ORDER — TIZANIDINE HCL 2 MG PO TABS: 2.0000 mg | ORAL_TABLET | Freq: Four times a day (QID) | ORAL | 0 refills | Status: DC | PRN

## 2018-03-11 NOTE — Progress Notes (Signed)
Name: Casey Hodges   MRN: [id number]    DOB: [birth year]   Date:03/11/2018       Progress Note  Subjective  Chief Complaint  Chief Complaint  Patient presents with  . Hip Pain    radiating towards knee x 2 weeks.    HPI  Patient presents for left hip pain states it is difficult to walk sit & lay down, dull achy pain that is constant that radiates down to foot. It is worse when getting gout of care. Limited daily activities due to pain. Denies injury. States has tried toradol, ibuprofen, flexeril without relief. States does work-out but has been unable to do for 3 weeks due to pain.   02/27/2018 seen [facility] for lumbar radiculopathy and bialteral hip bursitis; given shot of toradol and rx for toradol for the next 3 days. Referral for PT and reccomended re-evaluation in 6 weeks if unimproved.   States doesn't have that much money for food and medicine due to fixed income.   Denies hematuria or bowel or bladder incontinence.   Patient Active Problem List   Diagnosis Date Noted  . Orthostasis 08/24/2016  . Antibiotic-induced yeast infection 08/22/2016  . Exercise-induced tachycardia 08/22/2016  . Constipation due to opioid therapy 11/04/2015  . Severe episode of recurrent major depressive disorder, without psychotic features (HCC)   . GERD (gastroesophageal reflux disease) 09/16/2015  . Smoker 07/25/2015  . Snoring 06/09/2015  . Cardiac murmur 05/25/2015  . Drug-induced nausea and vomiting 03/22/2015  . Cyst, bone 02/14/2015  . Pelvic adhesive disease 01/21/2015  . Arthritis 12/19/2014  . Anxiety disorder 12/13/2014  . Postural dizziness 12/13/2014  . Subcutaneous cyst 12/13/2014  . Nephrolithiasis 05/23/2012  . Chronic pelvic pain in female 05/23/2012  . Fibromyalgia 05/23/2012  . Bipolar disorder with depression (HCC) 06/05/2011    Past Medical History:  Diagnosis Date  . Abnormal vaginal Pap smear    10+ years ago- no colpo repeat was normal  . Anxiety   . AR  (allergic rhinitis)   . Bipolar affective (HCC)    pt reported  . Bipolar disorder (HCC)   . Eating disorder    Under control per patient  . Fibromyalgia   . GERD (gastroesophageal reflux disease)   . Kidney stone   . Painful intercourse   . Painful menstrual periods   . Pelvic pain in female   . PTSD (post-traumatic stress disorder)   . Renal disorder   . Spinal stenosis   . Uterine polyp   . VWD (acquired von Willebrand's disease) [facility])     Past Surgical History:  Procedure Laterality Date  . ABDOMINAL HYSTERECTOMY    . CYSTOSCOPY WITH STENT PLACEMENT Left 04/19/2017   Procedure: CYSTOSCOPY WITH STENT PLACEMENT;  Surgeon: Crista Elliot, MD;  Location: [facility] ORS;  Service: Urology;  Laterality: Left;  . HYSTEROSCOPY     removed polyps  . LAPAROSCOPIC VAGINAL HYSTERECTOMY  2015   at [facility]  . LAPAROSCOPY Left 01/10/2015   Procedure: LAPAROSCOPY OPERATIVE with biopsy, left oopherectomy;  Surgeon: Herold Harms, MD;  Location: [facility] ORS;  Service: Gynecology;  Laterality: Left;  . LAPAROSCOPY ABDOMEN DIAGNOSTIC    . TUBAL LIGATION    . URETEROSCOPY WITH HOLMIUM LASER LITHOTRIPSY Left 04/19/2017   Procedure: URETEROSCOPY WITH HOLMIUM LASER LITHOTRIPSY;  Surgeon: Crista Elliot, MD;  Location: [facility] ORS;  Service: Urology;  Laterality: Left;    Social History   Tobacco Use  . Smoking status: Current Every  Day Smoker    Packs/day: 1.00    Types: Cigarettes    Start date: 05/18/1995  . Smokeless tobacco: Never Used  Substance Use Topics  . Alcohol use: No    Alcohol/week: 0.0 standard drinks    Comment: Socially- seldom     Current Outpatient Medications:  .  clonazePAM (KLONOPIN) 1 MG tablet, Take 1 mg by mouth 2 (two) times daily., Disp: , Rfl: 1 .  ibuprofen (ADVIL,MOTRIN) 400 MG tablet, Take 1 tablet (400 mg total) by mouth every 6 (six) hours as needed., Disp: 20 tablet, Rfl: 0 .  promethazine (PHENERGAN) 25 MG tablet, Take 1 tablet (25 mg total)  by mouth every 8 (eight) hours as needed for nausea or vomiting., Disp: 30 tablet, Rfl: 0 .  valACYclovir (VALTREX) 500 MG tablet, Take 1 tablet (500 mg total) by mouth daily., Disp: 30 tablet, Rfl: 11 .  ondansetron (ZOFRAN ODT) 4 MG disintegrating tablet, Take 1 tablet (4 mg total) by mouth every 8 (eight) hours as needed for nausea or vomiting. (Patient not taking: Reported on 03/11/2018), Disp: 20 tablet, Rfl: 0 .  QUEtiapine (SEROQUEL) 200 MG tablet, Take 200 mg by mouth daily., Disp: , Rfl: 2 .  tiZANidine (ZANAFLEX) 2 MG tablet, Take 1 tablet (2 mg total) by mouth every 6 (six) hours as needed for muscle spasms. (Patient not taking: Reported on 03/11/2018), Disp: 30 tablet, Rfl: 0 .  zolpidem (AMBIEN) 10 MG tablet, Take 1 tablet by mouth at bedtime as needed. , Disp: , Rfl:   Allergies  Allergen Reactions  . Wellbutrin [Bupropion Hcl] Other (See Comments)    Reaction:  Suicidal   . Augmentin [Amoxicillin-Pot Clavulanate] Diarrhea, Nausea And Vomiting and Other (See Comments)  . Hydrocodone-Acetaminophen Nausea And Vomiting  . Lasix [Furosemide] Rash  . Nitrofurantoin Monohyd Macro Rash    ROS   No other specific complaints in a complete review of systems (except as listed in HPI above).  Objective  Vitals:   03/11/18 1443  BP: (!) 92/58  Pulse: 100  Resp: 16  Temp: 98.6 F (37 C)  TempSrc: Oral  SpO2: 99%  Weight: 136 lb 6.4 oz (61.9 kg)  Height: 5\' 5"  (1.651 m)     Body mass index is 22.7 kg/m.  Nursing Note and Vital Signs reviewed.  Physical Exam  Constitutional: She appears well-developed and well-nourished. She is cooperative.  HENT:  Head: Normocephalic.  Right Ear: Hearing normal.  Left Ear: Hearing normal.  Nose: Nose normal.  Mouth/Throat: Oropharynx is clear and moist and mucous membranes are normal.  Cardiovascular: Normal rate, regular rhythm and normal heart sounds.  No lower extremity edema noted.  Pulmonary/Chest: Effort normal and breath  sounds normal.  Abdominal: Soft. Normal appearance and bowel sounds are normal.  Neurological: She is alert.  Psychiatric: She has a normal mood and affect. Her speech is normal and behavior is normal. Thought content normal.       No results found for this or any previous visit (from the past 48 hour(s)).  Assessment & Plan  1. Left hip pain Discussed stretching, offered PT - Ambulatory referral to Orthopedic Surgery - ketorolac (TORADOL) 30 MG/ML injection 30 mg - tiZANidine (ZANAFLEX) 2 MG tablet; Take 1 tablet (2 mg total) by mouth every 6 (six) hours as needed for muscle spasms.  Dispense: 30 tablet; Refill: 0 - naproxen (NAPROSYN) 250 MG tablet; Take 2 tablets (500 mg total) by mouth 2 (two) times daily with a meal for 14 days.  Dispense: 56 tablet; Refill: 0  2. Financial problems - Ambulatory referral to [facility]  3. Encounter for smoking cessation counseling Greater than 3 minutes discussing; 6 week follow-up  - nicotine (NICODERM CQ - DOSED IN MG/24 HOURS) 21 mg/24hr patch; Place 1 patch (21 mg total) onto the skin daily.  Dispense: 42 patch; Refill: 0

## 2018-03-11 NOTE — Patient Instructions (Addendum)
- Alternate between ice/heat 15 minutes at a time 3-4 times a day - Take one antiinflammatory medication either ibuprofen (2,400mg  a day) OR naproxen for 2 weeks, with food - Take  Muscle relaxer as needed - After using heat try some light stretching below - Let us know if you want an order for physical therapy - Sent an urgent referral for orthopedics; expect phone call for scheduling  -Sent referral for connected care to see if they can help you with paying for medications and food access.  ___________________________________________________________  Plan to quit smoking: Pick a quit date and toss out cigarettes. Sent nicotine patches to pharmacy- can use 21 mg patch for 4-6 weeks; have follow-up appointment here to see how you are doing- we will then plan to have you go to 14mg  patch for 2 weeks then 7mg  patch.    Hip Exercises Ask your health care provider which exercises are safe for you. Do exercises exactly as told by your health care provider and adjust them as directed. It is normal to feel mild stretching, pulling, tightness, or discomfort as you do these exercises, but you should stop right away if you feel sudden pain or your pain gets worse.Do not begin these exercises until told by your health care provider. STRETCHING AND RANGE OF MOTION EXERCISES These exercises warm up your muscles and joints and improve the movement and flexibility of your hip. These exercises also help to relieve pain, numbness, and tingling. Exercise A: Hamstrings, Supine  1. Lie on your back. 2. Loop a belt or towel over the ball of your left / rightfoot. The ball of your foot is on the walking surface, right under your toes. 3. Straighten your left / rightknee and slowly pull on the belt to raise your leg. ? Do not let your left / right knee bend while you do this. ? Keep your other leg flat on the floor. ? Raise the left / right leg until you feel a gentle stretch behind your left / right knee or  thigh. 4. Hold this position for __________ seconds. 5. Slowly return your leg to the starting position. Repeat __________ times. Complete this stretch __________ times a day. Exercise B: Hip Rotators  1. Lie on your back on a firm surface. 2. Hold your left / right knee with your left / right hand. Hold your ankle with your other hand. 3. Gently pull your left / right knee and rotate your lower leg toward your other shoulder. ? Pull until you feel a stretch in your buttocks. ? Keep your hips and shoulders firmly planted while you do this stretch. 4. Hold this position for __________ seconds. Repeat __________ times. Complete this stretch __________ times a day. Exercise C: V-Sit (Hamstrings and Adductors)  1. Sit on the floor with your legs extended in a large "V" shape. Keep your knees straight during this exercise. 2. Start with your head and chest upright, then bend at your waist to reach for your left foot (position A). You should feel a stretch in your right inner thigh. 3. Hold this position for __________ seconds. Then slowly return to the upright position. 4. Bend at your waist to reach forward (position B). You should feel a stretch behind both of your thighs and knees. 5. Hold this position for __________ seconds. Then slowly return to the upright position. 6. Bend at your waist to reach for your right foot (position C). You should feel a stretch in your left inner thigh.  7. Hold this position for __________ seconds. Then slowly return to the upright position. Repeat __________ times. Complete this stretch __________ times a day. Exercise D: Lunge (Hip Flexors)  1. Place your left / right knee on the floor and bend your other knee so that is directly over your ankle. You should be half-kneeling. 2. Keep good posture with your head over your shoulders. 3. Tighten your buttocks to point your tailbone downward. This helps your back to keep from arching too much. 4. You should feel  a gentle stretch in the front of your left / right thigh and hip. If you do not feel any resistance, slightly slide your other foot forward and then slowly lunge forward so your knee once again lines up over your ankle. 5. Make sure your tailbone continues to point downward. 6. Hold this position for __________ seconds. Repeat __________ times. Complete this stretch __________ times a day. STRENGTHENING EXERCISES These exercises build strength and endurance in your hip. Endurance is the ability to use your muscles for a long time, even after they get tired. Exercise E: Bridge (Hip Extensors)  1. Lie on your back on a firm surface with your knees bent and your feet flat on the floor. 2. Tighten your buttocks muscles and lift your bottom off the floor until the trunk of your body is level with your thighs. ? Do not arch your back. ? You should feel the muscles working in your buttocks and the back of your thighs. If you do not feel these muscles, slide your feet 1-2 inches (2.5-5 cm) farther away from your buttocks. 3. Hold this position for __________ seconds. 4. Slowly lower your hips to the starting position. 5. Let your muscles relax completely between repetitions. 6. If this exercise is too easy, try doing it with your arms crossed over your chest. Repeat __________ times. Complete this exercise __________ times a day. Exercise F: Straight Leg Raises - Hip Abductors  1. Lie on your side with your left / right leg in the top position. Lie so your head, shoulder, knee, and hip line up with each other. You may bend your bottom knee to help you balance. 2. Roll your hips slightly forward, so your hips are stacked directly over each other and your left / right knee is facing forward. 3. Leading with your heel, lift your top leg 4-6 inches (10-15 cm). You should feel the muscles in your outer hip lifting. ? Do not let your foot drift forward. ? Do not let your knee roll toward the  ceiling. 4. Hold this position for __________ seconds. 5. Slowly return to the starting position. 6. Let your muscles relax completely between repetitions. Repeat __________ times. Complete this exercise __________ times a day. Exercise G: Straight Leg Raises - Hip Adductors  1. Lie on your side with your left / right leg in the bottom position. Lie so your head, shoulder, knee, and hip line up. You may place your upper foot in front to help you balance. 2. Roll your hips slightly forward, so your hips are stacked directly over each other and your left / right knee is facing forward. 3. Tense the muscles in your inner thigh and lift your bottom leg 4-6 inches (10-15 cm). 4. Hold this position for __________ seconds. 5. Slowly return to the starting position. 6. Let your muscles relax completely between repetitions. Repeat __________ times. Complete this exercise __________ times a day. Exercise H: Straight Leg Raises - Quadriceps  1.  Lie on your back with your left / right leg extended and your other knee bent. 2. Tense the muscles in the front of your left / right thigh. When you do this, you should see your kneecap slide up or see increased dimpling just above your knee. 3. Tighten these muscles even more and raise your leg 4-6 inches (10-15 cm) off the floor. 4. Hold this position for __________ seconds. 5. Keep these muscles tense as you lower your leg. 6. Relax the muscles slowly and completely between repetitions. Repeat __________ times. Complete this exercise __________ times a day. Exercise I: Hip Abductors, Standing 1. Tie one end of a rubber exercise band or tubing to a secure surface, such as a table or pole. 2. Loop the other end of the band or tubing around your left / right ankle. 3. Keeping your ankle with the band or tubing directly opposite of the secured end, step away until there is tension in the tubing or band. Hold onto a chair as needed for balance. 4. Lift your  left / right leg out to your side. While you do this: ? Keep your back upright. ? Keep your shoulders over your hips. ? Keep your toes pointing forward. ? Make sure to use your hip muscles to lift your leg. Do not "throw" your leg or tip your body to lift your leg. 5. Hold this position for __________ seconds. 6. Slowly return to the starting position. Repeat __________ times. Complete this exercise __________ times a day. Exercise J: Squats (Quadriceps) 1. Stand in a door frame so your feet and knees are in line with the frame. You may place your hands on the frame for balance. 2. Slowly bend your knees and lower your hips like you are going to sit in a chair. ? Keep your lower legs in a straight-up-and-down position. ? Do not let your hips go lower than your knees. ? Do not bend your knees lower than told by your health care provider. ? If your hip pain increases, do not bend as low. 3. Hold this position for ___________ seconds. 4. Slowly push with your legs to return to standing. Do not use your hands to pull yourself to standing. Repeat __________ times. Complete this exercise __________ times a day. This information is not intended to replace advice given to you by your health care provider. Make sure you discuss any questions you have with your health care provider. Document Released: 06/29/2005 Document Revised: 03/05/2016 Document Reviewed: 06/06/2015 Elsevier Interactive Patient Education  [employer]2018 [employer]

## 2018-03-14 ENCOUNTER — Telehealth: Payer: Self-pay | Admitting: Family Medicine

## 2018-03-14 DIAGNOSIS — M545 Low back pain: Secondary | ICD-10-CM | POA: Diagnosis not present

## 2018-03-14 NOTE — Telephone Encounter (Signed)
Copied from CRM [phone]#[id number]. Topic: Quick Communication - See Telephone Encounter >> Mar 14, 2018 11:38 AM Lorrine KinMcGee, Anderia Lorenzo B, NT wrote: CRM for notification. See Telephone encounter for: 03/14/18. Patient would like to know if there is something else that could be sent in for her hip and leg pain? States that the muscle relaxer is not doing anything. Has been taking ibuprofen also and it is not helping. States that the naproxen (NAPROSYN) 250 MG tablet were not received by the pharmacy. Please advise. CB#: [phone]

## 2018-03-14 NOTE — Telephone Encounter (Signed)
Please advise [facility] walk-in clinic should be open until 7pm tonight if she feels she needs further evaluation.  Please ensure naproxen has been sent in.  No additional pain medication is indicated at this time.

## 2018-03-14 NOTE — Telephone Encounter (Signed)
Pt.notified

## 2018-03-17 DIAGNOSIS — M25552 Pain in left hip: Secondary | ICD-10-CM | POA: Diagnosis not present

## 2018-03-17 DIAGNOSIS — M5442 Lumbago with sciatica, left side: Secondary | ICD-10-CM | POA: Diagnosis not present

## 2018-03-17 DIAGNOSIS — M25852 Other specified joint disorders, left hip: Secondary | ICD-10-CM | POA: Diagnosis not present

## 2018-03-17 DIAGNOSIS — G8929 Other chronic pain: Secondary | ICD-10-CM | POA: Diagnosis not present

## 2018-03-18 ENCOUNTER — Other Ambulatory Visit: Payer: Self-pay | Admitting: Sports Medicine

## 2018-03-18 DIAGNOSIS — M545 Low back pain: Secondary | ICD-10-CM

## 2018-03-19 DIAGNOSIS — F4310 Post-traumatic stress disorder, unspecified: Secondary | ICD-10-CM | POA: Diagnosis not present

## 2018-03-19 DIAGNOSIS — F3181 Bipolar II disorder: Secondary | ICD-10-CM | POA: Diagnosis not present

## 2018-03-19 DIAGNOSIS — F633 Trichotillomania: Secondary | ICD-10-CM | POA: Diagnosis not present

## 2018-04-07 ENCOUNTER — Ambulatory Visit
Admission: RE | Admit: 2018-04-07 | Discharge: 2018-04-07 | Disposition: A | Discharge status: Home / Self Care | Payer: Medicare Other | Source: Ambulatory Visit | Attending: Sports Medicine | Admitting: Sports Medicine

## 2018-04-07 DIAGNOSIS — G8929 Other chronic pain: Secondary | ICD-10-CM | POA: Diagnosis not present

## 2018-04-07 DIAGNOSIS — M5442 Lumbago with sciatica, left side: Secondary | ICD-10-CM | POA: Insufficient documentation

## 2018-04-07 DIAGNOSIS — M25552 Pain in left hip: Secondary | ICD-10-CM | POA: Diagnosis not present

## 2018-04-07 DIAGNOSIS — M5126 Other intervertebral disc displacement, lumbar region: Secondary | ICD-10-CM | POA: Insufficient documentation

## 2018-04-07 DIAGNOSIS — M545 Low back pain, unspecified: Secondary | ICD-10-CM

## 2018-04-09 DIAGNOSIS — F3181 Bipolar II disorder: Secondary | ICD-10-CM | POA: Diagnosis not present

## 2018-04-09 DIAGNOSIS — F4310 Post-traumatic stress disorder, unspecified: Secondary | ICD-10-CM | POA: Diagnosis not present

## 2018-04-09 DIAGNOSIS — F633 Trichotillomania: Secondary | ICD-10-CM | POA: Diagnosis not present

## 2018-04-29 DIAGNOSIS — M7070 Other bursitis of hip, unspecified hip: Secondary | ICD-10-CM | POA: Diagnosis not present

## 2018-04-29 DIAGNOSIS — M5416 Radiculopathy, lumbar region: Secondary | ICD-10-CM | POA: Diagnosis not present

## 2018-05-16 DIAGNOSIS — F4310 Post-traumatic stress disorder, unspecified: Secondary | ICD-10-CM | POA: Diagnosis not present

## 2018-05-16 DIAGNOSIS — F3132 Bipolar disorder, current episode depressed, moderate: Secondary | ICD-10-CM | POA: Diagnosis not present

## 2018-05-16 DIAGNOSIS — F410 Panic disorder [episodic paroxysmal anxiety] without agoraphobia: Secondary | ICD-10-CM | POA: Diagnosis not present

## 2018-06-04 DIAGNOSIS — F3181 Bipolar II disorder: Secondary | ICD-10-CM | POA: Diagnosis not present

## 2018-06-04 DIAGNOSIS — F4310 Post-traumatic stress disorder, unspecified: Secondary | ICD-10-CM | POA: Diagnosis not present

## 2018-06-04 DIAGNOSIS — F633 Trichotillomania: Secondary | ICD-10-CM | POA: Diagnosis not present

## 2018-06-30 ENCOUNTER — Other Ambulatory Visit: Payer: Self-pay | Admitting: Certified Nurse Midwife

## 2018-06-30 DIAGNOSIS — Z1231 Encounter for screening mammogram for malignant neoplasm of breast: Secondary | ICD-10-CM

## 2018-07-01 ENCOUNTER — Other Ambulatory Visit: Payer: Self-pay | Admitting: Certified Nurse Midwife

## 2018-07-01 ENCOUNTER — Ambulatory Visit
Admission: RE | Admit: 2018-07-01 | Discharge: 2018-07-01 | Disposition: A | Discharge status: Home / Self Care | Payer: Medicare Other | Source: Ambulatory Visit | Attending: Certified Nurse Midwife | Admitting: Certified Nurse Midwife

## 2018-07-01 DIAGNOSIS — N6310 Unspecified lump in the right breast, unspecified quadrant: Secondary | ICD-10-CM

## 2018-07-01 DIAGNOSIS — R928 Other abnormal and inconclusive findings on diagnostic imaging of breast: Secondary | ICD-10-CM

## 2018-07-01 DIAGNOSIS — Z1231 Encounter for screening mammogram for malignant neoplasm of breast: Secondary | ICD-10-CM | POA: Insufficient documentation

## 2018-07-14 ENCOUNTER — Ambulatory Visit
Admission: RE | Admit: 2018-07-14 | Discharge: 2018-07-14 | Disposition: A | Discharge status: Home / Self Care | Payer: Medicare Other | Source: Ambulatory Visit | Attending: Certified Nurse Midwife | Admitting: Certified Nurse Midwife

## 2018-07-14 DIAGNOSIS — R928 Other abnormal and inconclusive findings on diagnostic imaging of breast: Secondary | ICD-10-CM | POA: Insufficient documentation

## 2018-07-14 DIAGNOSIS — N6310 Unspecified lump in the right breast, unspecified quadrant: Secondary | ICD-10-CM | POA: Insufficient documentation

## 2018-07-15 ENCOUNTER — Other Ambulatory Visit: Payer: Self-pay | Admitting: Certified Nurse Midwife

## 2018-07-15 DIAGNOSIS — N6001 Solitary cyst of right breast: Secondary | ICD-10-CM

## 2018-08-11 ENCOUNTER — Encounter: Payer: Self-pay | Admitting: Nurse Practitioner

## 2018-08-11 ENCOUNTER — Ambulatory Visit (INDEPENDENT_AMBULATORY_CARE_PROVIDER_SITE_OTHER): Payer: Medicare Other | Admitting: Nurse Practitioner

## 2018-08-11 VITALS — BP 116/78 | HR 92 | Temp 98.4°F | Resp 16 | Ht 65.0 in | Wt 140.8 lb

## 2018-08-11 DIAGNOSIS — Z599 Problem related to housing and economic circumstances, unspecified: Secondary | ICD-10-CM

## 2018-08-11 DIAGNOSIS — L299 Pruritus, unspecified: Secondary | ICD-10-CM

## 2018-08-11 DIAGNOSIS — L308 Other specified dermatitis: Secondary | ICD-10-CM

## 2018-08-11 DIAGNOSIS — F319 Bipolar disorder, unspecified: Secondary | ICD-10-CM | POA: Diagnosis not present

## 2018-08-11 DIAGNOSIS — Z598 Other problems related to housing and economic circumstances: Secondary | ICD-10-CM | POA: Diagnosis not present

## 2018-08-11 MED ORDER — TRIAMCINOLONE ACETONIDE 0.1 % EX CREA: 1.0000 "application " | TOPICAL_CREAM | Freq: Two times a day (BID) | CUTANEOUS | 0 refills | Status: DC

## 2018-08-11 MED ORDER — LORATADINE 10 MG PO TABS: 10.0000 mg | ORAL_TABLET | Freq: Every day | ORAL | 0 refills | Status: DC

## 2018-08-11 NOTE — Patient Instructions (Addendum)
Contact Dermatitis  Dermatitis is redness, soreness, and swelling (inflammation) of the skin. Contact dermatitis is a reaction to something that touches the skin.  There are two types of contact dermatitis:   Irritant contact dermatitis. This happens when something bothers (irritates) your skin, like soap.   Allergic contact dermatitis. This is caused when you are exposed to something that you are allergic to, such as poison ivy.  What are the causes?   Common causes of irritant contact dermatitis include:  ? Makeup.  ? Soaps.  ? Detergents.  ? Bleaches.  ? Acids.  ? Metals, such as nickel.   Common causes of allergic contact dermatitis include:  ? Plants.  ? Chemicals.  ? Jewelry.  ? Latex.  ? Medicines.  ? Preservatives in products, such as clothing.  What increases the risk?   Having a job that exposes you to things that bother your skin.   Having asthma or eczema.  What are the signs or symptoms?  Symptoms may happen anywhere the irritant has touched your skin. Symptoms include:   Dry or flaky skin.   Redness.   Cracks.   Itching.   Pain or a burning feeling.   Blisters.   Blood or clear fluid draining from skin cracks.  With allergic contact dermatitis, swelling may occur. This may happen in places such as the eyelids, mouth, or genitals.  How is this treated?   This condition is treated by checking for the cause of the reaction and protecting your skin. Treatment may also include:  ? Steroid creams, ointments, or medicines.  ? Antibiotic medicines or other ointments, if you have a skin infection.  ? Lotion or medicines to help with itching.  ? A bandage (dressing).  Follow these instructions at home:  Skin care   Moisturize your skin as needed.   Put cool cloths on your skin.   Put a baking soda paste on your skin. Stir water into baking soda until it looks like a paste.   Do not scratch your skin.   Avoid having things rub up against your skin.   Avoid the use of soaps, perfumes, and  dyes.  Medicines   Take or apply over-the-counter and prescription medicines only as told by your doctor.   If you were prescribed an antibiotic medicine, take or apply it as told by your doctor. Do not stop using it even if your condition starts to get better.  Bathing   Take a bath with:  ? Epsom salts.  ? Baking soda.  ? Colloidal oatmeal.   Bathe less often.   Bathe in warm water. Avoid using hot water.  Bandage care   If you were given a bandage, change it as told by your health care provider.   Wash your hands with soap and water before and after you change your bandage. If soap and water are not available, use hand sanitizer.  General instructions   Avoid the things that caused your reaction. If you do not know what caused it, keep a journal. Write down:  ? What you eat.  ? What skin products you use.  ? What you drink.  ? What you wear in the area that has symptoms. This includes jewelry.   Check the affected areas every day for signs of infection. Check for:  ? More redness, swelling, or pain.  ? More fluid or blood.  ? Warmth.  ? Pus or a bad smell.   Keep all follow-up visits as   told by your doctor. This is important.  Contact a doctor if:   You do not get better with treatment.   Your condition gets worse.   You have signs of infection, such as:  ? More swelling.  ? Tenderness.  ? More redness.  ? Soreness.  ? Warmth.   You have a fever.   You have new symptoms.  Get help right away if:   You have a very bad headache.   You have neck pain.   Your neck is stiff.   You throw up (vomit).   You feel very sleepy.   You see red streaks coming from the area.   Your bone or joint near the area hurts after the skin has healed.   The area turns darker.   You have trouble breathing.  Summary   Dermatitis is redness, soreness, and swelling of the skin.   Symptoms may occur where the irritant has touched you.   Treatment may include medicines and skin care.   If you do not know what caused  your reaction, keep a journal.   Contact a doctor if your condition gets worse or you have signs of infection.  This information is not intended to replace advice given to you by your health care provider. Make sure you discuss any questions you have with your health care provider.  Document Released: 04/08/2009 Document Revised: 12/25/2017 Document Reviewed: 12/25/2017  Elsevier Interactive Patient Education  2019 [employer]vier Inc[employer]

## 2018-08-11 NOTE — Progress Notes (Signed)
Name: Casey CadetKena C Drum   MRN: [id number]    DOB: 11/01/1975   Date:08/11/2018       Progress Note  Subjective  Chief Complaint  Chief Complaint  Patient presents with  . Rash    patient stated that she has a rash around her tattoo that she got last summer. it itches and burns. started about a week or so ago. otc hydrocortisone.  . Depression    HPI  Patient endorses itching to area on her right shoulder in areas she has black ink from a tattoo she got last summer. The itching started in the last 2 weeks. No changes in detergents, soaps. Has been treating with hydrocortisone- is expired, provides temporary relief.   [facility] 2/[facility] 08/11/2018 03/11/2018 01/21/2017 09/20/2016 07/23/2016  Decreased Interest 3 0 3 0 0  Down, Depressed, Hopeless 3 0 3 0 0  PHQ - 2 Score 6 0 6 0 0  Altered sleeping 1 3 3  - -  Tired, decreased energy 2 3 3  - -  Change in appetite 3 0 1 - -  Feeling bad or failure about yourself  3 0 3 - -  Trouble concentrating 3 2 3  - -  Moving slowly or fidgety/restless 3 0 3 - -  Suicidal thoughts 0 0 2 - -  PHQ-9 Score 21 8 24  - -  Difficult doing work/chores Extremely dIfficult Somewhat difficult Extremely dIfficult - -  Some recent data might be hidden   On initial PHQ9 completed by CMA patient scored 3 on suicidal ideation. To this clinician patient vehemently denies thoughts of hurting herself and has no plan of motive to do so. She notes severe depression- states she has had this since his husband passed but it has been significantly worse the past few months. Her daughter has moved out and daughter is the only person she enjoys being around and she does not want to spend time with any of her friends and has isolated herself. She is having financial difficulties and cannot afford to see her psychiatrist routinely so she is not taking her medications as prescribed as she is trying to space them out. She is bipolar and feels she needs to be on an anti-depressant at  this time because she is so overwhelmed with sadness. Patient states she could not imagine hurting herself as she knows how hard that would be for her daughter and wants to be there for her. She has attempted suicide in the past states it was 2-3 years ago when she was in a darker place but does not want to discuss this- states she does not feel this way at this time.   Patient Active Problem List   Diagnosis Date Noted  . Orthostasis 08/24/2016  . Antibiotic-induced yeast infection 08/22/2016  . Exercise-induced tachycardia 08/22/2016  . Constipation due to opioid therapy 11/04/2015  . Severe episode of recurrent major depressive disorder, without psychotic features (HCC)   . GERD (gastroesophageal reflux disease) 09/16/2015  . Smoker 07/25/2015  . Snoring 06/09/2015  . Cardiac murmur 05/25/2015  . Drug-induced nausea and vomiting 03/22/2015  . Cyst, bone 02/14/2015  . Pelvic adhesive disease 01/21/2015  . Arthritis 12/19/2014  . Anxiety disorder 12/13/2014  . Postural dizziness 12/13/2014  . Subcutaneous cyst 12/13/2014  . Nephrolithiasis 05/23/2012  . Chronic pelvic pain in female 05/23/2012  . Fibromyalgia 05/23/2012  . Bipolar disorder with depression (HCC) 06/05/2011    Past Medical History:  Diagnosis Date  . Abnormal vaginal Pap  smear    10+ years ago- no colpo repeat was normal  . Anxiety   . AR (allergic rhinitis)   . Bipolar affective (HCC)    pt reported  . Bipolar disorder (HCC)   . Bursitis of both hips   . Calcium, deposits, in bursa    left hip  . Eating disorder    Under control per patient  . Fibromyalgia   . GERD (gastroesophageal reflux disease)   . Kidney stone   . Painful intercourse   . Painful menstrual periods   . Pelvic pain in female   . PTSD (post-traumatic stress disorder)   . Renal disorder   . Spinal stenosis   . Uterine polyp   . VWD (acquired von Willebrand's disease) [facility])     Past Surgical History:  Procedure Laterality Date  .  ABDOMINAL HYSTERECTOMY    . CYSTOSCOPY WITH STENT PLACEMENT Left 04/19/2017   Procedure: CYSTOSCOPY WITH STENT PLACEMENT;  Surgeon: Crista Elliot, MD;  Location: [facility] ORS;  Service: Urology;  Laterality: Left;  . HYSTEROSCOPY     removed polyps  . LAPAROSCOPIC VAGINAL HYSTERECTOMY  2015   at [facility]  . LAPAROSCOPY Left 01/10/2015   Procedure: LAPAROSCOPY OPERATIVE with biopsy, left oopherectomy;  Surgeon: Herold Harms, MD;  Location: [facility] ORS;  Service: Gynecology;  Laterality: Left;  . LAPAROSCOPY ABDOMEN DIAGNOSTIC    . OOPHORECTOMY Left   . TUBAL LIGATION    . URETEROSCOPY WITH HOLMIUM LASER LITHOTRIPSY Left 04/19/2017   Procedure: URETEROSCOPY WITH HOLMIUM LASER LITHOTRIPSY;  Surgeon: Crista Elliot, MD;  Location: [facility] ORS;  Service: Urology;  Laterality: Left;    Social History   Tobacco Use  . Smoking status: Current Every Day Smoker    Packs/day: 1.00    Types: Cigarettes    Start date: 05/18/1995  . Smokeless tobacco: Never Used  Substance Use Topics  . Alcohol use: No    Alcohol/week: 0.0 standard drinks    Comment: Socially- seldom     Current Outpatient Medications:  .  clonazePAM (KLONOPIN) 1 MG tablet, Take 1 mg by mouth 2 (two) times daily., Disp: , Rfl: 1 .  ibuprofen (ADVIL,MOTRIN) 400 MG tablet, Take 1 tablet (400 mg total) by mouth every 6 (six) hours as needed., Disp: 20 tablet, Rfl: 0 .  QUEtiapine (SEROQUEL) 200 MG tablet, Take 200 mg by mouth daily., Disp: , Rfl: 2 .  valACYclovir (VALTREX) 500 MG tablet, Take 1 tablet (500 mg total) by mouth daily., Disp: 30 tablet, Rfl: 11 .  zolpidem (AMBIEN) 10 MG tablet, Take 1 tablet by mouth at bedtime as needed. , Disp: , Rfl:  .  loratadine (CLARITIN) 10 MG tablet, Take 1 tablet (10 mg total) by mouth daily., Disp: 30 tablet, Rfl: 0 .  promethazine (PHENERGAN) 25 MG tablet, Take 1 tablet (25 mg total) by mouth every 8 (eight) hours as needed for nausea or vomiting. (Patient not taking: Reported  on 08/11/2018), Disp: 30 tablet, Rfl: 0 .  triamcinolone cream (KENALOG) 0.1 %, Apply 1 application topically 2 (two) times daily., Disp: 30 g, Rfl: 0  Allergies  Allergen Reactions  . Wellbutrin [Bupropion Hcl] Other (See Comments)    Reaction:  Suicidal   . Augmentin [Amoxicillin-Pot Clavulanate] Diarrhea, Nausea And Vomiting and Other (See Comments)  . Hydrocodone-Acetaminophen Nausea And Vomiting  . Lasix [Furosemide] Rash  . Nitrofurantoin Monohyd Macro Rash    ROS   No other specific complaints in a complete review  of systems (except as listed in HPI above).  Objective  Vitals:   08/11/18 1348  BP: 116/78  Pulse: 92  Resp: 16  Temp: 98.4 F (36.9 C)  TempSrc: Oral  SpO2: 96%  Weight: 140 lb 12.8 oz (63.9 kg)  Height: 5\' 5"  (1.651 m)    Body mass index is 23.43 kg/m.  Nursing Note and Vital Signs reviewed.  Physical Exam Constitutional:      Appearance: Normal appearance.  HENT:     Head: Normocephalic and atraumatic.  Eyes:     Conjunctiva/sclera: Conjunctivae normal.  Cardiovascular:     Rate and Rhythm: Normal rate.  Pulmonary:     Effort: Pulmonary effort is normal.  Skin:    General: Skin is warm and dry.     Findings: No erythema or rash.       Neurological:     General: No focal deficit present.     Mental Status: She is alert and oriented to person, place, and time.  Psychiatric:        Mood and Affect: Mood is anxious and depressed.        Speech: Speech normal.        Behavior: Behavior normal. Behavior is cooperative.        Thought Content: Thought content normal.        Cognition and Memory: Cognition normal.        No results found for this or any previous visit (from the past 48 hour(s)).  Assessment & Plan  1. Pruritic dermatitis - loratadine (CLARITIN) 10 MG tablet; Take 1 tablet (10 mg total) by mouth daily.  Dispense: 30 tablet; Refill: 0 - triamcinolone cream (KENALOG) 0.1 %; Apply 1 application topically 2 (two) times  daily.  Dispense: 30 g; Refill: 0  2. Financial difficulties - Ambulatory referral to [facility]cted [facility]  3. Bipolar disorder with depression [facility]) Mobile crisis unit- [place]. Arrived and spoke to the patient after provider after deep discussion. Plan was made together for patient to follow-up at [facility] on Wednesday morning. Mobile crisis resources provided. Patient able to verbally contract for safety.     -Red flags and when to present for emergency care or RTC including fever >101.51F, chest pain, shortness of breath, new/worsening/un-resolving symptoms, reviewed with patient at time of visit. Follow up and care instructions discussed and provided in AVS. Face-to-face time with patient was more than 25 minutes, >50% time spent counseling and coordination of care.

## 2018-08-20 ENCOUNTER — Other Ambulatory Visit: Payer: Self-pay | Admitting: Nurse Practitioner

## 2018-08-20 DIAGNOSIS — L299 Pruritus, unspecified: Secondary | ICD-10-CM

## 2018-08-21 ENCOUNTER — Other Ambulatory Visit: Payer: Self-pay | Admitting: Nurse Practitioner

## 2018-08-21 DIAGNOSIS — L299 Pruritus, unspecified: Secondary | ICD-10-CM

## 2018-08-21 MED ORDER — LORATADINE 10 MG PO TABS: 10.0000 mg | ORAL_TABLET | Freq: Every day | ORAL | 1 refills | Status: DC

## 2018-08-29 ENCOUNTER — Emergency Department
Admission: EM | Admit: 2018-08-29 | Discharge: 2018-08-29 | Disposition: A | Discharge status: Home / Self Care | Payer: Medicare Other | Attending: Emergency Medicine | Admitting: Emergency Medicine

## 2018-08-29 ENCOUNTER — Encounter: Payer: Self-pay | Admitting: Emergency Medicine

## 2018-08-29 ENCOUNTER — Other Ambulatory Visit: Payer: Self-pay

## 2018-08-29 DIAGNOSIS — Z79899 Other long term (current) drug therapy: Secondary | ICD-10-CM | POA: Insufficient documentation

## 2018-08-29 DIAGNOSIS — M545 Low back pain, unspecified: Secondary | ICD-10-CM

## 2018-08-29 DIAGNOSIS — F17210 Nicotine dependence, cigarettes, uncomplicated: Secondary | ICD-10-CM | POA: Insufficient documentation

## 2018-08-29 DIAGNOSIS — G8929 Other chronic pain: Secondary | ICD-10-CM | POA: Diagnosis not present

## 2018-08-29 MED ORDER — METHYLPREDNISOLONE 4 MG PO TBPK: ORAL_TABLET | ORAL | 0 refills | Status: DC

## 2018-08-29 MED ORDER — CYCLOBENZAPRINE HCL 10 MG PO TABS: 10.0000 mg | ORAL_TABLET | Freq: Three times a day (TID) | ORAL | 0 refills | Status: DC | PRN

## 2018-08-29 MED ORDER — ORPHENADRINE CITRATE 30 MG/ML IJ SOLN
60.0000 mg | Freq: Two times a day (BID) | INTRAMUSCULAR | Status: DC
  Administered 2018-08-29: 60 mg via INTRAMUSCULAR
  Filled 2018-08-29: qty 2

## 2018-08-29 MED ORDER — METHYLPREDNISOLONE SODIUM SUCC 125 MG IJ SOLR
125.0000 mg | Freq: Once | INTRAMUSCULAR | Status: AC
  Administered 2018-08-29: 125 mg via INTRAMUSCULAR
  Filled 2018-08-29: qty 2

## 2018-08-29 MED ORDER — LIDOCAINE 5 % EX PTCH: 1.0000 | MEDICATED_PATCH | Freq: Two times a day (BID) | CUTANEOUS | 0 refills | Status: AC

## 2018-08-29 MED ORDER — HYDROMORPHONE HCL 1 MG/ML IJ SOLN
1.0000 mg | Freq: Once | INTRAMUSCULAR | Status: AC
  Administered 2018-08-29: 1 mg via INTRAMUSCULAR
  Filled 2018-08-29: qty 1

## 2018-08-29 NOTE — Discharge Instructions (Signed)
Change Lidoderm patches every 12 hours as needed for pain.  Take muscle relaxers for 5 days.  Follow-up with pain management clinic.  Discharge care instructions.

## 2018-08-29 NOTE — ED Provider Notes (Signed)
[facility] Emergency Department Provider Note   ____________________________________________   First MD Initiated Contact with Patient 08/29/18 1628     (approximate)  I have reviewed the triage vital signs and the nursing notes.   HISTORY  Chief Complaint Back Pain    HPI Casey Hodges is a 43 y.o. female patient complain of low back pain for 3 days.  Patient state has a history of chronic back pain.  Patient denies provocative incident for complaint.  Patient denies bladder bowel dysfunction.  Patient denies radicular component to her back pain.  Patient rates the pain as a 10/10.  Patient described the pain is "sharp".  Patient also complains of spasms in her low back.  Patient has history of disc bulge at L4-5.  No palliative measures for complaint.   Past Medical History:  Diagnosis Date  . Abnormal vaginal Pap smear    10+ years ago- no colpo repeat was normal  . Anxiety   . AR (allergic rhinitis)   . Bipolar affective (HCC)    pt reported  . Bipolar disorder (HCC)   . Bursitis of both hips   . Calcium, deposits, in bursa    left hip  . Eating disorder    Under control per patient  . Fibromyalgia   . GERD (gastroesophageal reflux disease)   . Kidney stone   . Painful intercourse   . Painful menstrual periods   . Pelvic pain in female   . PTSD (post-traumatic stress disorder)   . Renal disorder   . Spinal stenosis   . Uterine polyp   . VWD (acquired von Willebrand's disease) [facility])     Patient Active Problem List   Diagnosis Date Noted  . Orthostasis 08/24/2016  . Antibiotic-induced yeast infection 08/22/2016  . Exercise-induced tachycardia 08/22/2016  . Constipation due to opioid therapy 11/04/2015  . Severe episode of recurrent major depressive disorder, without psychotic features (HCC)   . GERD (gastroesophageal reflux disease) 09/16/2015  . Smoker 07/25/2015  . Snoring 06/09/2015  . Cardiac murmur 05/25/2015  .  Drug-induced nausea and vomiting 03/22/2015  . Cyst, bone 02/14/2015  . Pelvic adhesive disease 01/21/2015  . Arthritis 12/19/2014  . Anxiety disorder 12/13/2014  . Postural dizziness 12/13/2014  . Subcutaneous cyst 12/13/2014  . Nephrolithiasis 05/23/2012  . Chronic pelvic pain in female 05/23/2012  . Fibromyalgia 05/23/2012  . Bipolar disorder with depression (HCC) 06/05/2011    Past Surgical History:  Procedure Laterality Date  . ABDOMINAL HYSTERECTOMY    . CYSTOSCOPY WITH STENT PLACEMENT Left 04/19/2017   Procedure: CYSTOSCOPY WITH STENT PLACEMENT;  Surgeon: Crista Elliot, MD;  Location: [facility] ORS;  Service: Urology;  Laterality: Left;  . HYSTEROSCOPY     removed polyps  . LAPAROSCOPIC VAGINAL HYSTERECTOMY  2015   at [facility]  . LAPAROSCOPY Left 01/10/2015   Procedure: LAPAROSCOPY OPERATIVE with biopsy, left oopherectomy;  Surgeon: Herold Harms, MD;  Location: [facility] ORS;  Service: Gynecology;  Laterality: Left;  . LAPAROSCOPY ABDOMEN DIAGNOSTIC    . OOPHORECTOMY Left   . TUBAL LIGATION    . URETEROSCOPY WITH HOLMIUM LASER LITHOTRIPSY Left 04/19/2017   Procedure: URETEROSCOPY WITH HOLMIUM LASER LITHOTRIPSY;  Surgeon: Crista Elliot, MD;  Location: [facility] ORS;  Service: Urology;  Laterality: Left;    Prior to Admission medications   Medication Sig Start Date End Date Taking? Authorizing Provider  clonazePAM (KLONOPIN) 1 MG tablet Take 1 mg by mouth 2 (two) times daily.  12/18/17   [provider]  cyclobenzaprine (FLEXERIL) 10 MG tablet Take 1 tablet (10 mg total) by mouth 3 (three) times daily as needed. 08/29/18   Joni Reining, PA-C  ibuprofen (ADVIL,MOTRIN) 400 MG tablet Take 1 tablet (400 mg total) by mouth every 6 (six) hours as needed. 04/21/17   Dionne Bucy, MD  lidocaine (LIDODERM) 5 % Place 1 patch onto the skin every 12 (twelve) hours. Remove & Discard patch within 12 hours or as directed by MD 08/29/18 08/29/19  Joni Reining, PA-C    loratadine (CLARITIN) 10 MG tablet Take 1 tablet (10 mg total) by mouth daily. 08/21/18   Cheryle Horsfall, NP  methylPREDNISolone (MEDROL DOSEPAK) 4 MG TBPK tablet Take Tapered dose as directed 08/29/18   Joni Reining, PA-C  promethazine (PHENERGAN) 25 MG tablet Take 1 tablet (25 mg total) by mouth every 8 (eight) hours as needed for nausea or vomiting. Patient not taking: Reported on 08/11/2018 01/04/18   Sharyn Creamer, MD  QUEtiapine (SEROQUEL) 200 MG tablet Take 200 mg by mouth daily. 04/15/17   [provider]  triamcinolone cream (KENALOG) 0.1 % Apply 1 application topically 2 (two) times daily. 08/11/18   Poulose, Percell Belt, NP  valACYclovir (VALTREX) 500 MG tablet Take 1 tablet (500 mg total) by mouth daily. 11/13/17   Doreene Burke, CNM  zolpidem (AMBIEN) 10 MG tablet Take 1 tablet by mouth at bedtime as needed.  03/27/16   [provider]    Allergies Wellbutrin [bupropion hcl]; Augmentin [amoxicillin-pot clavulanate]; Hydrocodone-acetaminophen; Lasix [furosemide]; and Nitrofurantoin monohyd macro  Family History  Problem Relation Age of Onset  . Hypertension Father   . Sleep apnea Mother   . Breast cancer Paternal Grandmother   . Diabetes Maternal Grandmother   . Breast cancer Maternal Grandmother   . Diabetes Maternal Aunt   . Diabetes Maternal Uncle     Social History Social History   Tobacco Use  . Smoking status: Current Every Day Smoker    Packs/day: 1.00    Types: Cigarettes    Start date: 05/18/1995  . Smokeless tobacco: Never Used  Substance Use Topics  . Alcohol use: No    Alcohol/week: 0.0 standard drinks    Comment: Socially- seldom  . Drug use: No    Review of Systems Constitutional: No fever/chills Eyes: No visual changes. ENT: No sore throat. Cardiovascular: Denies chest pain. Respiratory: Denies shortness of breath. Gastrointestinal: No abdominal pain.  No nausea, no vomiting.  No diarrhea.  No constipation. Genitourinary:  Negative for dysuria. Musculoskeletal: Positive for back pain. Skin: Negative for rash. Neurological: Negative for headaches, focal weakness or numbness. Psychiatric:  Anxiety, bipolar, and PTSD. Allergic/Immunilogical: See allergy list. ____________________________________________   PHYSICAL EXAM:  VITAL SIGNS: ED Triage Vitals  Enc Vitals Group     BP 08/29/18 1619 (!) 125/49     Pulse Rate 08/29/18 1619 (!) 112     Resp 08/29/18 1619 16     Temp 08/29/18 1619 98.2 F (36.8 C)     Temp Source 08/29/18 1619 Oral     SpO2 08/29/18 1619 100 %     Weight 08/29/18 1621 139 lb (63 kg)     Height 08/29/18 1621  (1.575 m)     Head Circumference --      Peak Flow --      Pain Score 08/29/18 1620 10     Pain Loc --      Pain Edu? --  Excl. in GC? --     Constitutional: Alert and oriented.  Appears anxious.   Neck: No stridor.  Hematological/Lymphatic/Immunilogical: No cervical lymphadenopathy. Cardiovascular: Tachycardic, regular rhythm. Grossly normal heart sounds.  Good peripheral circulation. Respiratory: Normal respiratory effort.  No retractions. Lungs CTAB. Gastrointestinal: Soft and nontender. No distention. No abdominal bruits. No CVA tenderness. Musculoskeletal: No obvious spinal deformity.  Patient is moderate guarding palpation L4-S1.  Patient is negative straight leg raise in supine position.  No lower extremity tenderness nor edema.  No joint effusions. Neurologic:  Normal speech and language. No gross focal neurologic deficits are appreciated. No gait instability. Skin:  Skin is warm, dry and intact. No rash noted. Psychiatric: Mood and affect are normal. Speech and behavior are normal.  ____________________________________________   LABS (all labs ordered are listed, but only abnormal results are displayed)  Labs Reviewed - No data to  display ____________________________________________  EKG   ____________________________________________  RADIOLOGY  ED MD interpretation:    Official radiology report(s): No results found.  ____________________________________________   PROCEDURES  Procedure(s) performed (including Critical Care):  Procedures   ____________________________________________   INITIAL IMPRESSION / ASSESSMENT AND PLAN / ED COURSE  As part of my medical decision making, I reviewed the following data within the electronic MEDICAL RECORD NUMBER         Chronic back pain.  Patient given discharge care instruction advised to follow-up pain management clinic listed in her discharge care instruction.      ____________________________________________   FINAL CLINICAL IMPRESSION(S) / ED DIAGNOSES  Final diagnoses:  Chronic midline low back pain without sciatica     ED Discharge Orders         Ordered    lidocaine (LIDODERM) 5 %  Every 12 hours     08/29/18 1659    cyclobenzaprine (FLEXERIL) 10 MG tablet  3 times daily PRN     08/29/18 1659    methylPREDNISolone (MEDROL DOSEPAK) 4 MG TBPK tablet     08/29/18 1714           Note:  This document was prepared using Dragon voice recognition software and may include unintentional dictation errors.    Joni Reining, PA-C 08/29/18 1715    Jene Every, MD 09/01/18 1147

## 2018-08-29 NOTE — ED Triage Notes (Signed)
Pt to ED via POV c/o back pain x 3 days. Pt is in NAD at this time.

## 2018-09-18 ENCOUNTER — Telehealth: Payer: Self-pay

## 2018-09-18 NOTE — Telephone Encounter (Signed)
Copied from CRM [id number]. Topic: Referral - Status >> Sep 18, 2018  2:12 PM Casey Hodges D wrote: 09/18/2018 Spoke with patient about financial and counseling resources will email resources per patient's request. Gave my name and number if email is not received.MA

## 2018-12-17 ENCOUNTER — Telehealth: Payer: Self-pay | Admitting: Certified Nurse Midwife

## 2018-12-17 ENCOUNTER — Telehealth: Payer: Self-pay

## 2018-12-17 NOTE — Telephone Encounter (Signed)
Patient called and stated that she never received a call from our office to reschedule her mammogram. I informed the patient that normally pt's schedule a mammogram after the provider puts an order in. I informed the patient a message would be sent back. Please advise.

## 2018-12-17 NOTE — Telephone Encounter (Signed)
Informed patient she may call and schedule her mammogram when it is convenient for her. Pt expressed understanding.

## 2019-01-09 ENCOUNTER — Other Ambulatory Visit: Payer: Self-pay | Admitting: Certified Nurse Midwife

## 2019-01-09 NOTE — Telephone Encounter (Signed)
Refill sent. Patient will need an appointment before more refills authorized. Last appointment 2018

## 2019-01-13 ENCOUNTER — Ambulatory Visit
Admission: RE | Admit: 2019-01-13 | Discharge: 2019-01-13 | Disposition: A | Discharge status: Home / Self Care | Payer: Medicare Other | Source: Ambulatory Visit | Attending: Certified Nurse Midwife | Admitting: Certified Nurse Midwife

## 2019-01-13 DIAGNOSIS — N6001 Solitary cyst of right breast: Secondary | ICD-10-CM | POA: Diagnosis not present

## 2019-01-14 ENCOUNTER — Other Ambulatory Visit: Payer: Self-pay | Admitting: Certified Nurse Midwife

## 2019-01-31 ENCOUNTER — Other Ambulatory Visit: Payer: Self-pay | Admitting: Certified Nurse Midwife

## 2019-03-04 ENCOUNTER — Other Ambulatory Visit: Payer: Self-pay | Admitting: Certified Nurse Midwife

## 2019-03-16 ENCOUNTER — Ambulatory Visit (INDEPENDENT_AMBULATORY_CARE_PROVIDER_SITE_OTHER): Payer: Medicare Other | Admitting: Certified Nurse Midwife

## 2019-03-16 ENCOUNTER — Encounter: Payer: Self-pay | Admitting: Certified Nurse Midwife

## 2019-03-16 ENCOUNTER — Other Ambulatory Visit: Payer: Self-pay

## 2019-03-16 VITALS — BP 109/66 | HR 78 | Ht 62.0 in | Wt 132.1 lb

## 2019-03-16 DIAGNOSIS — Z1231 Encounter for screening mammogram for malignant neoplasm of breast: Secondary | ICD-10-CM

## 2019-03-16 DIAGNOSIS — Z01419 Encounter for gynecological examination (general) (routine) without abnormal findings: Secondary | ICD-10-CM | POA: Diagnosis not present

## 2019-03-16 MED ORDER — VALACYCLOVIR HCL 500 MG PO TABS
500.0000 mg | ORAL_TABLET | Freq: Every day | ORAL | 0 refills | Status: DC
Start: 2019-03-16 — End: 2019-04-13

## 2019-03-16 NOTE — Progress Notes (Signed)
GYNECOLOGY ANNUAL PREVENTATIVE CARE ENCOUNTER NOTE  History:     Casey Hodges is a 43 y.o. 621P1001 female here for a routine annual gynecologic exam.  Current complaints: none.   Denies abnormal vaginal bleeding, discharge, pelvic pain, problems with intercourse or other gynecologic concerns.  She is sexually active with one female partner, Denies STD.    Gynecologic History Patient's last menstrual period was 09/16/2015. Contraception: hysterectomy , complete Last Pap: N/a  Last mammogram: 07/14/18. Results were: Probable benign findings in the right breast  Obstetric History OB History  Gravida Para Term Preterm AB Living  1 1 1     1   SAB TAB Ectopic Multiple Live Births          1    # Outcome Date GA Lbr Len/2nd Weight Sex Delivery Anes PTL Lv  1 Term 1999   7 lb 9 oz (3.43 kg) F Vag-Spont   LIV    Past Medical History:  Diagnosis Date  . Abnormal vaginal Pap smear    10+ years ago- no colpo repeat was normal  . Anxiety   . AR (allergic rhinitis)   . Bipolar affective (HCC)    pt reported  . Bipolar disorder (HCC)   . Bursitis of both hips   . Calcium, deposits, in bursa    left hip  . Eating disorder    Under control per patient  . Fibromyalgia   . GERD (gastroesophageal reflux disease)   . Kidney stone   . Painful intercourse   . Painful menstrual periods   . Pelvic pain in female   . PTSD (post-traumatic stress disorder)   . Renal disorder   . Spinal stenosis   . Uterine polyp   . VWD (acquired von Willebrand's disease) [facility](HCC)     Past Surgical History:  Procedure Laterality Date  . ABDOMINAL HYSTERECTOMY    . CYSTOSCOPY WITH STENT PLACEMENT Left 04/19/2017   Procedure: CYSTOSCOPY WITH STENT PLACEMENT;  Surgeon: Crista ElliotBell, Eugene D III, MD;  Location: [facility] ORS;  Service: Urology;  Laterality: Left;  . HYSTEROSCOPY     removed polyps  . LAPAROSCOPIC VAGINAL [facility]CTOMY  2015   [facility]  . LAPAROSCOPY Left 01/10/2015   Procedure: LAPAROSCOPY OPERATIVE  with biopsy, left oopherectomy;  Surgeon: Herold HarmsMartin A Defrancesco, MD;  Location: [facility] OR[facility];  Service: Gynecology;  Laterality: Left;  . LAPAROSCOPY ABDOMEN DIAGNOSTIC    . OOPHORECTOMY Left   . TUBAL LIGATION    . URETEROSCOPY WITH HOLMIUM LASER LITHOTRIPSY Left 04/19/2017   Procedure: URETEROSCOPY WITH HOLMIUM LASER LITHOTRIPSY;  Surgeon: Crista ElliotBell, Eugene D III, MD;  Location: [facility] OR[facility];  Service: Urology;  Laterality: Left;    Current Outpatient Medications on File Prior to Visit  Medication Sig Dispense Refill  . clonazePAM (KLONOPIN) 1 MG tablet Take 1 mg by mouth 2 (two) times daily.  1  . cyclobenzaprine (FLEXERIL) 10 MG tablet Take 1 tablet (10 mg total) by mouth 3 (three) times daily as needed. 15 tablet 0  . ibuprofen (ADVIL,MOTRIN) 400 MG tablet Take 1 tablet (400 mg total) by mouth every 6 (six) hours as needed. 20 tablet 0  . lidocaine (LIDODERM) 5 % Place 1 patch onto the skin every 12 (twelve) hours. Remove & Discard patch within 12 hours or as directed by MD 10 patch 0  . loratadine (CLARITIN) 10 MG tablet Take 1 tablet (10 mg total) by mouth daily. 90 tablet 1  . methylPREDNISolone (MEDROL DOSEPAK) 4 MG TBPK tablet Take  Tapered dose as directed 21 tablet 0  . promethazine (PHENERGAN) 25 MG tablet Take 1 tablet (25 mg total) by mouth every 8 (eight) hours as needed for nausea or vomiting. (Patient not taking: Reported on 08/11/2018) 30 tablet 0  . QUEtiapine (SEROQUEL) 200 MG tablet Take 200 mg by mouth daily.  2  . triamcinolone cream (KENALOG) 0.1 % Apply 1 application topically 2 (two) times daily. 30 g 0  . valACYclovir (VALTREX) 500 MG tablet TAKE 1 TABLET BY MOUTH EVERY DAY 30 tablet 0  . zolpidem (AMBIEN) 10 MG tablet Take 1 tablet by mouth at bedtime as needed.      No current facility-administered medications on file prior to visit.     Allergies  Allergen Reactions  . Wellbutrin [Bupropion Hcl] Other (See Comments)    Reaction:  Suicidal   . Augmentin [Amoxicillin-Pot  Clavulanate] Diarrhea, Nausea And Vomiting and Other (See Comments)  . Hydrocodone-Acetaminophen Nausea And Vomiting  . Lasix [Furosemide] Rash  . Nitrofurantoin Monohyd Macro Rash    Social History:  reports that she has been smoking cigarettes. She started smoking about 23 years ago. She has been smoking about 1.00 pack per day. She has never used smokeless tobacco. She reports that she does not drink alcohol or use drugs.  She exercises 5 days week x 1.5 hrs. She still smokes cigarettes. Denies drugs.  Family History  Problem Relation Age of Onset  . Hypertension Father   . Sleep apnea Mother   . Breast cancer Paternal Grandmother   . Diabetes Maternal Grandmother   . Breast cancer Maternal Grandmother   . Diabetes Maternal Aunt   . Diabetes Maternal Uncle     The following portions of the patient's history were reviewed and updated as appropriate: allergies, current medications, past family history, past medical history, past social history, past surgical history and problem list.  Review of Systems Pertinent items noted in HPI and remainder of comprehensive ROS otherwise negative.  Physical Exam:  BP 109/66   Pulse 78   Ht 5\' 2"  (1.575 m)   Wt 132 lb 1 oz (59.9 kg)   LMP 09/16/2015 Comment: neg preg test  BMI 24.15 kg/m  CONSTITUTIONAL: Well-developed, well-nourished female in no acute distress.  HENT:  Normocephalic, atraumatic, External right and left ear normal. Oropharynx is clear and moist EYES: Conjunctivae and EOM are normal. Pupils are equal, round, and reactive to light. No scleral icterus.  NECK: Normal range of motion, supple, no masses.  Normal thyroid.  SKIN: Skin is warm and dry. No rash noted. Not diaphoretic. No erythema. No pallor. MUSCULOSKELETAL: Normal range of motion. No tenderness.  No cyanosis, clubbing, or edema.  2+ distal pulses. NEUROLOGIC: Alert and oriented to person, place, and time. Normal reflexes, muscle tone coordination. No cranial nerve  deficit noted. PSYCHIATRIC: Normal mood and affect. Normal behavior. Normal judgment and thought content. CARDIOVASCULAR: Normal heart rate noted, regular rhythm RESPIRATORY: Clear to auscultation bilaterally. Effort and breath sounds normal, no problems with respiration noted. BREASTS: Symmetric in size. No masses, skin changes, nipple drainage, or lymphadenopathy. ABDOMEN: Soft, normal bowel sounds, no distention noted.  No tenderness, rebound or guarding.  PELVIC: Normal appearing external genitalia; normal appearing vaginal mucosa and cervix.  No abnormal discharge noted.  Pap smear obtained.  Normal uterine size, no other palpable masses, no uterine or adnexal tenderness.   Assessment and Plan:  1 . Breast cancer screening by mammogram - [place] DIGITAL SCREENING BILATERAL; Future  Pap n/s -complete  hysterectomy  Mammogram scheduled Labs: declines cholesterol at this time, will check for insurance coverage Pt notes odor after intercourse, encouraged use of boric acid vaginal suppository . Sample given with instructions on use.  Routine preventative health maintenance measures emphasized. Please refer to After Visit Summary for other counseling recommendations.    Doreene Burke, CNM

## 2019-03-16 NOTE — Patient Instructions (Signed)

## 2019-04-07 ENCOUNTER — Telehealth: Payer: Self-pay

## 2019-04-07 MED ORDER — BORIC ACID CRYS: 600.0000 mg | CRYSTALS | 5 refills | Status: DC

## 2019-04-07 NOTE — Telephone Encounter (Signed)
Spoke with patient- she is having trouble finding boric acid vag supp. Script sent to [facility].

## 2019-04-11 ENCOUNTER — Other Ambulatory Visit: Payer: Self-pay | Admitting: Certified Nurse Midwife

## 2019-06-04 ENCOUNTER — Other Ambulatory Visit: Payer: Self-pay

## 2019-06-04 ENCOUNTER — Encounter: Payer: Self-pay | Admitting: Family Medicine

## 2019-06-04 ENCOUNTER — Ambulatory Visit (INDEPENDENT_AMBULATORY_CARE_PROVIDER_SITE_OTHER): Payer: Medicare Other | Admitting: Family Medicine

## 2019-06-04 VITALS — Temp 97.5°F | Ht 62.0 in | Wt 127.7 lb

## 2019-06-04 DIAGNOSIS — R8290 Unspecified abnormal findings in urine: Secondary | ICD-10-CM

## 2019-06-04 DIAGNOSIS — Z87442 Personal history of urinary calculi: Secondary | ICD-10-CM | POA: Diagnosis not present

## 2019-06-04 DIAGNOSIS — R109 Unspecified abdominal pain: Secondary | ICD-10-CM | POA: Diagnosis not present

## 2019-06-04 MED ORDER — DOXYCYCLINE HYCLATE 100 MG PO TABS: 100.0000 mg | ORAL_TABLET | Freq: Two times a day (BID) | ORAL | 0 refills | Status: DC

## 2019-06-04 NOTE — Progress Notes (Signed)
Name: Casey Hodges Dansby   MRN: [id number]    DOB: 07/17/1975   Date:06/04/2019       Progress Note  Subjective  Chief Complaint  Chief Complaint  Patient presents with   Dysuria    Onset-past couple of days burning during urinating, drank tea 3 days in a row but usually drinks water. States this morning is a little better but having right flank pain.    I connected with  Casey Hodges Frisk on 06/04/19 at 10:00 AM EST by telephone and verified that I am speaking with the correct person using two identifiers.  I discussed the limitations, risks, security and privacy concerns of performing an evaluation and management service by telephone and the availability of in person appointments. Staff also discussed with the patient that there may be a patient responsible charge related to this service. Patient Location: at home  [facility]    HPI  Dysuria: she states she has a long history of kidney stone and s/p stent placement , she also has a history of recurrent UTI's . She states she usually drinks vitamin water and sparkling juices, however she states she had tea three days in a row and two days ago her urine was dark and had a strong odor. She also noticed dull ache on right flank intermittently. She also has some dysuria, urinary frequency but no hesitancy. She states she felt nausea this am but she states it may unrelated since she only drank coffee and took MVI without any food.    Patient Active Problem List   Diagnosis Date Noted   Orthostasis 08/24/2016   Antibiotic-induced yeast infection 08/22/2016   Exercise-induced tachycardia 08/22/2016   Constipation due to opioid therapy 11/04/2015   Severe episode of recurrent major depressive disorder, without psychotic features (HCC)    GERD (gastroesophageal reflux disease) 09/16/2015   Smoker 07/25/2015   Snoring 06/09/2015   Cardiac murmur 05/25/2015   Drug-induced nausea and vomiting  03/22/2015   Cyst, bone 02/14/2015   Pelvic adhesive disease 01/21/2015   Arthritis 12/19/2014   Anxiety disorder 12/13/2014   Postural dizziness 12/13/2014   Subcutaneous cyst 12/13/2014   Nephrolithiasis 05/23/2012   Chronic pelvic pain in female 05/23/2012   Fibromyalgia 05/23/2012   Bipolar disorder with depression (HCC) 06/05/2011    Past Surgical History:  Procedure Laterality Date   ABDOMINAL HYSTERECTOMY     CYSTOSCOPY WITH STENT PLACEMENT Left 04/19/2017   Procedure: CYSTOSCOPY WITH STENT PLACEMENT;  Surgeon: Crista ElliotBell, Eugene D III, MD;  Location: [facility];  Service: Urology;  Laterality: Left;   HYSTEROSCOPY     removed polyps   LAPAROSCOPIC [facility]AGINAL [facility]CT[facility]  [facility]   [facility]   LAPAROSCOPY Left 01/10/2015   Procedure: LAPAROSCOPY OPERATIVE with biopsy, left oopherectomy;  Surgeon: Herold HarmsMartin A Defrancesco, MD;  Location: [facility] OR[facility];  Service: Gynecology;  Laterality: Left;   LAPAROSCOPY ABDOMEN DIAGNOSTIC     OOPHORECTOMY Left    TUBAL LIGATION     URETEROSCOPY WITH HOLMIUM LASER LITHOTRIPSY Left 04/19/2017   Procedure: URETEROSCOPY WITH HOLMIUM LASER LITHOTRIPSY;  Surgeon: Crista ElliotBell, Eugene D III, MD;  Location: [facility] OR[facility];  Service: Urology;  Laterality: Left;    Family History  Problem Relation Age of Onset   Hypertension Father    Sleep apnea Mother    Breast cancer Paternal Grandmother    Diabetes Maternal Grandmother    Breast cancer Maternal Grandmother    Diabetes Maternal Aunt    Diabetes Maternal Uncle  Social History   Socioeconomic History   Marital status: Widowed    Spouse name: Not on file   Number of children: 2   Years of education: Not on file   Highest education level: GED or equivalent  Occupational History   Occupation: Un-employed due to bipolar  Tobacco Use   Smoking status: Current Every Day Smoker    Packs/day: 1.00    Types: Cigarettes    Start date: 05/18/1995   Smokeless tobacco: Never Used    Substance and Sexual Activity   Alcohol use: No    Alcohol/week: 0.0 standard drinks    Comment: Socially- seldom   Drug use: No   Sexual activity: Yes    Partners: Male    Birth control/protection: Surgical  Other Topics Concern   Not on file  Social History Narrative   Regular Exercise -  NO   Daily Caffeine Use:  1 cup coffee in am      1 child, one step child      4 years ago she watched her husband die slowly before her - resulting in her having PTSD      Social Determinants of Health   Financial Resource Strain: High Risk   Difficulty of Paying Living Expenses: Hard  Food Insecurity: Food Insecurity Present   Worried About Running Out of Food in the Last Year: Sometimes true   Ran Out of Food in the Last Year: Sometimes true  Transportation Needs: No Transportation Needs   Lack of Transportation (Medical): No   Lack of Transportation (Non-Medical): No  Physical Activity: Insufficiently Active   Days of Exercise per Week: 5 days   Minutes of Exercise per Session: 20 min  Stress: Stress Concern Present   Feeling of Stress : To some extent  Social Connections: Somewhat Isolated   Frequency of Communication with Friends and Family: More than three times a week   Frequency of Social Gatherings with Friends and Family: More than three times a week   Attends Religious Services: More than 4 times per year   Active Member of [employer] or Organizations: No   Attends Archivist Meetings: Never   Marital Status: Widowed  Human resources officer Violence: Not At Risk   Fear of Current or Ex-Partner: No   Emotionally Abused: No   Physically Abused: No   Sexually Abused: No     Current Outpatient Medications:    Boric Acid CRYS, Place 600 mg vaginally 2 (two) times a week., Disp: 500 g, Rfl: 5   clonazePAM (KLONOPIN) 1 MG tablet, Take 1 mg by mouth 2 (two) times daily., Disp: , Rfl: 1   ibuprofen (ADVIL,MOTRIN) 400 MG tablet, Take 1 tablet (400 mg  total) by mouth every 6 (six) hours as needed., Disp: 20 tablet, Rfl: 0   lidocaine (LIDODERM) 5 %, Place 1 patch onto the skin every 12 (twelve) hours. Remove & Discard patch within 12 hours or as directed by MD, Disp: 10 patch, Rfl: 0   oxcarbazepine (TRILEPTAL) 600 MG tablet, Take 600 mg by mouth 2 (two) times daily., Disp: , Rfl:    triamcinolone cream (KENALOG) 0.1 %, Apply 1 application topically 2 (two) times daily., Disp: 30 g, Rfl: 0   valACYclovir (VALTREX) 500 MG tablet, TAKE 1 TABLET BY MOUTH EVERY DAY, Disp: 30 tablet, Rfl: 6   VRAYLAR capsule, Take 1.5 mg by mouth daily., Disp: , Rfl:    zolpidem (AMBIEN) 10 MG tablet, Take 1 tablet by mouth at bedtime as  needed., Disp: , Rfl:    doxycycline (VIBRA-TABS) 100 MG tablet, Take 1 tablet (100 mg total) by mouth 2 (two) times daily., Disp: 14 tablet, Rfl: 0   loratadine (CLARITIN) 10 MG tablet, Take 1 tablet (10 mg total) by mouth daily. (Patient not taking: Reported on 06/04/2019), Disp: 90 tablet, Rfl: 1  Allergies  Allergen Reactions   Wellbutrin [Bupropion Hcl] Other (See Comments)    Reaction:  Suicidal    Augmentin [Amoxicillin-Pot Clavulanate] Diarrhea, Nausea And Vomiting and Other (See Comments)   Hydrocodone-Acetaminophen Nausea And Vomiting   Lasix [Furosemide] Rash   Nitrofurantoin Monohyd Macro Rash    I personally reviewed active problem list, medication list, allergies, family history, social history, health maintenance with the patient/caregiver today.   ROS  Ten systems reviewed and is negative except as mentioned in HPI   Objective  Virtual encounter, vitals not obtained.  Body mass index is 23.36 kg/m.  Physical Exam  Awake, alert and oriented  PHQ2/9: Depression screen [facility] 2/9 06/04/2019 08/11/2018 03/11/2018 01/21/2017 09/20/2016  Decreased Interest 0 3 0 3 0  Down, Depressed, Hopeless 2 3 0 3 0  PHQ - 2 Score 2 6 0 6 0  Altered sleeping 1 1 3 3  -  Tired, decreased energy 2 2 3 3  -    Change in appetite 3 3 0 1 -  Feeling bad or failure about yourself  2 3 0 3 -  Trouble concentrating 3 3 2 3  -  Moving slowly or fidgety/restless 2 3 0 3 -  Suicidal thoughts 0 0 0 2 -  PHQ-9 Score 15 21 8 24  -  Difficult doing work/chores Somewhat difficult Extremely dIfficult Somewhat difficult Extremely dIfficult -  Some recent data might be hidden   PHQ-2/9 Result is positive.    Fall Risk: Fall Risk  06/04/2019 08/11/2018 01/21/2017 09/20/2016 07/23/2016  Falls in the past year? 0 0 Yes No No  Number falls in past yr: 0 0 2 or more - -  Injury with Fall? 0 0 No - -  Risk Factor Category  - - High Fall Risk - -  Follow up - - Falls prevention discussed - -     Assessment & Plan  1. Abnormal urine odor  - CULTURE, URINE COMPREHENSIVE - doxycycline (VIBRA-TABS) 100 MG tablet; Take 1 tablet (100 mg total) by mouth 2 (two) times daily.  Dispense: 14 tablet; Refill: 0  2. Acute right flank pain  - CULTURE, URINE COMPREHENSIVE - doxycycline (VIBRA-TABS) 100 MG tablet; Take 1 tablet (100 mg total) by mouth 2 (two) times daily.  Dispense: 14 tablet; Refill: 0  3. History of kidney stones  - CULTURE, URINE COMPREHENSIVE  Discussed CT scan to rule out kidney stone of pyelonephritis, but she states she is afraid of radiation exposure at this time, she states odor has improved , no hematuria, try ibuprofen, fluids and doxy and if no improvement go to [facility] or call 01/23/2017 back .   I discussed the assessment and treatment plan with the patient. The patient was provided an opportunity to ask questions and all were answered. The patient agreed with the plan and demonstrated an understanding of the instructions.   The patient was advised to call back or seek an in-person evaluation if the symptoms worsen or if the condition fails to improve as anticipated.  I provided 25 minutes of non-face-to-face time during this encounter.  09/22/2016, MD

## 2019-06-09 IMAGING — MR MR LUMBAR SPINE W/O CM
5 series · 31 of 48 positions shown · non-contrast
Comparison: CT abdomen and pelvis 01/04/2018. MRI lumbar spine
08/06/2014.

CLINICAL DATA: Low back pain radiating into the left lower
extremity for 1 month. No known injury.

EXAM:
MRI LUMBAR SPINE WITHOUT CONTRAST
TECHNIQUE: Multiplanar, multisequence MR imaging of the lumbar spine was
performed. No intravenous contrast was administered.

[Series 5: T2 · sagittal · 4.0mm · 0.81mm/px · 6 of 17 slices shown (1 of 2)]
[im 1/17]
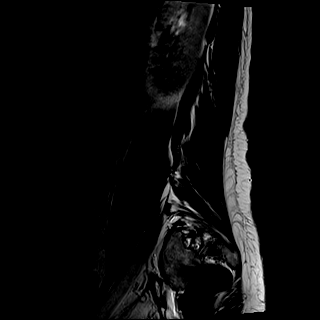
[im 4/17]
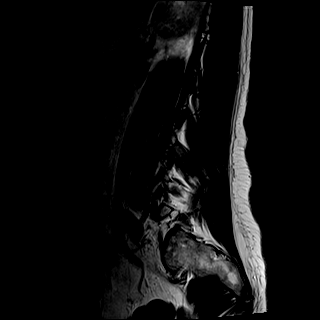
[im 7/17]
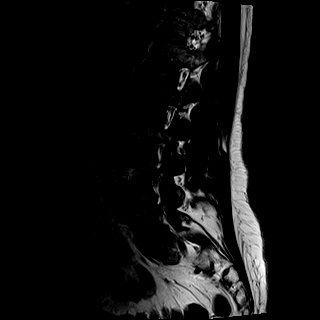
[im 10/17]
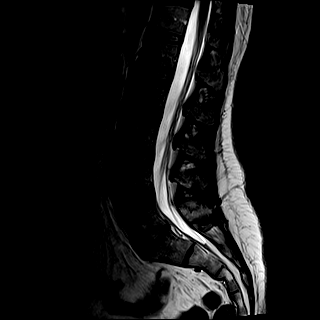
[im 13/17]
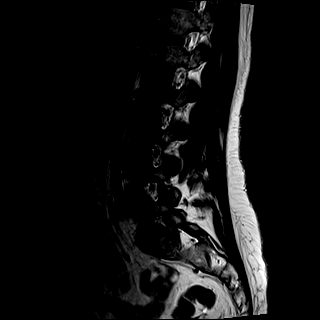
[im 17/17]
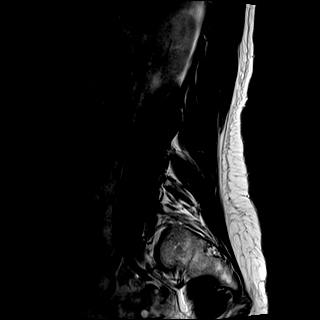

[Series 6: T1 · sagittal · 4.0mm · 0.81mm/px · 7 of 17 slices shown (1 of 2)]
[im 1/17]
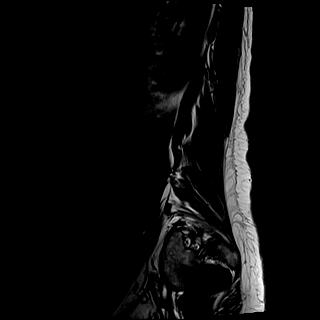
[im 3/17]
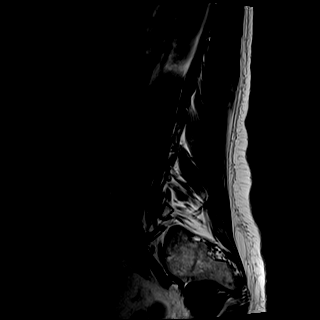
[im 6/17]
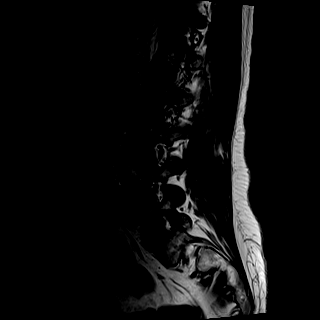
[im 9/17]
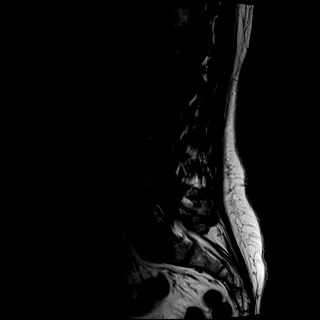
[im 11/17]
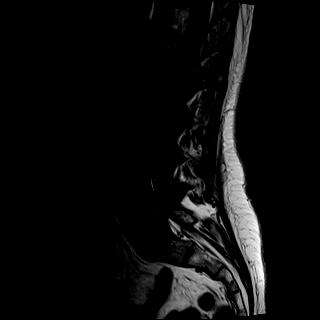
[im 14/17]
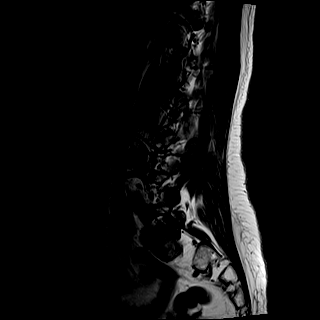
[im 17/17]
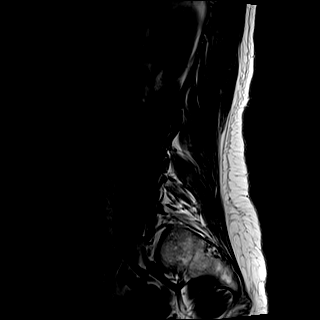

[Series 7: STIR · sagittal · 4.0mm · 0.41mm/px · 2 of 17 slices shown]
[im 1/17]
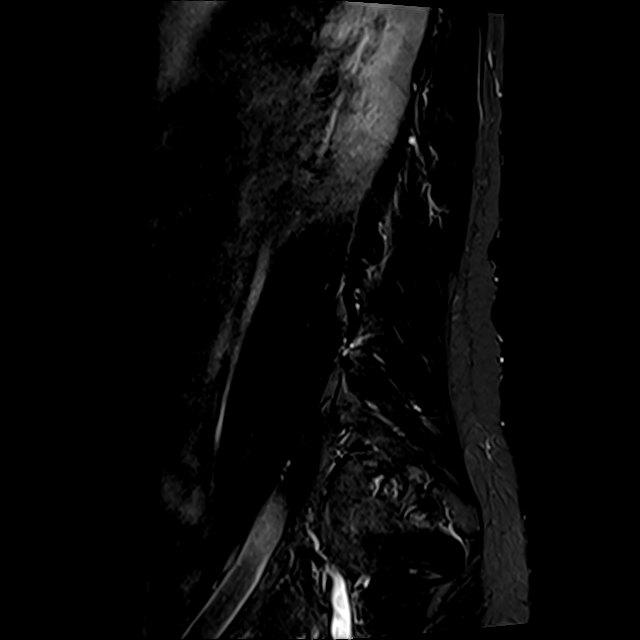
[im 3/17]
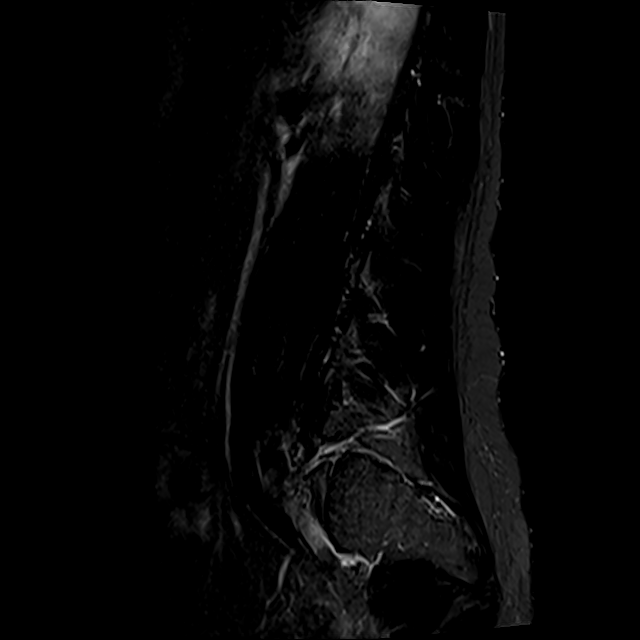

[Series 8: T2 · axial · 4.0mm · 0.78mm/px · z∈[-123,+89]mm · 8 of 36 slices shown (2 of 2)]
[im 1/36]
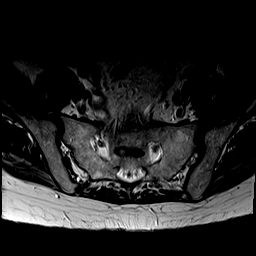
[im 6/36]
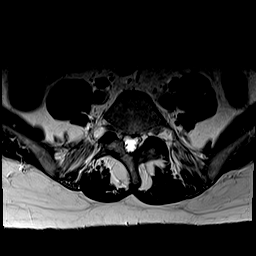
[im 11/36]
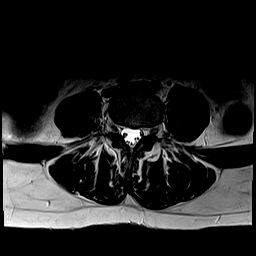
[im 17/36]
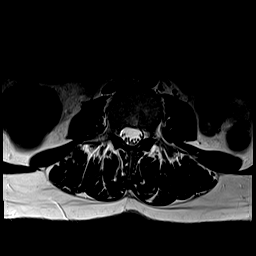
[im 19/36]
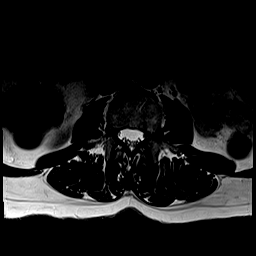
[im 25/36]
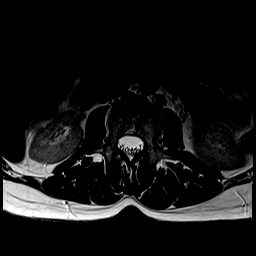
[im 30/36]
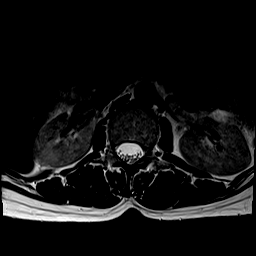
[im 36/36]
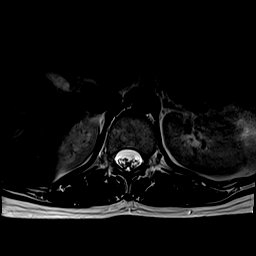

[Series 9: T1 · axial · 4.0mm · 0.39mm/px · z∈[-123,+89]mm · 8 of 36 slices shown (2 of 2)]
[im 1/36]
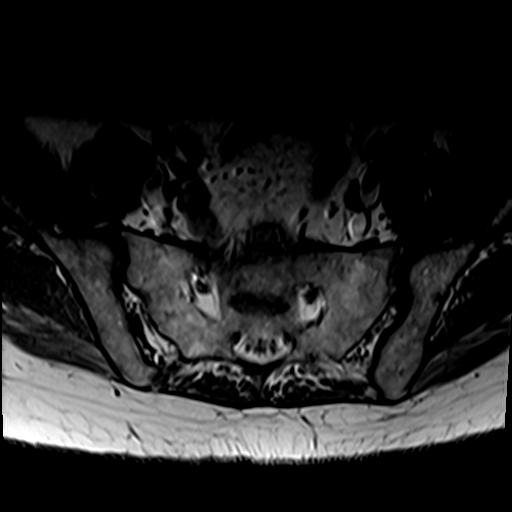
[im 6/36]
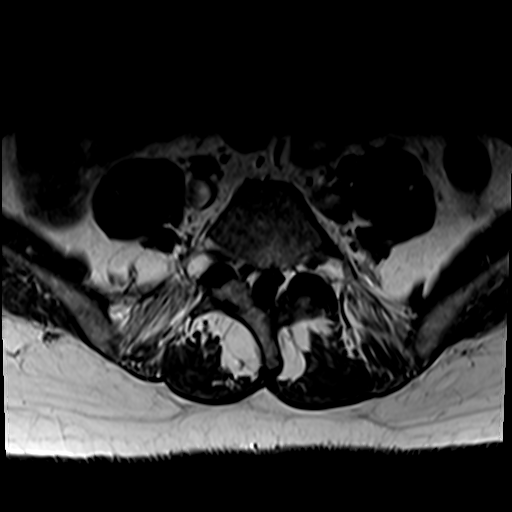
[im 11/36]
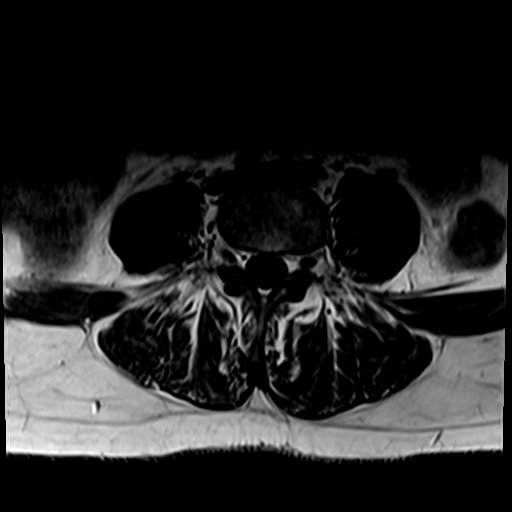
[im 17/36]
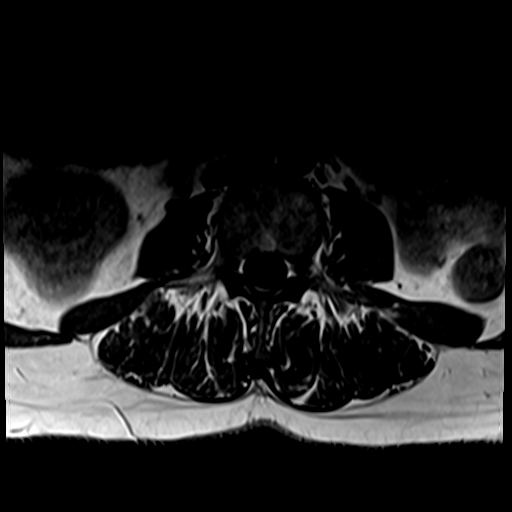
[im 19/36]
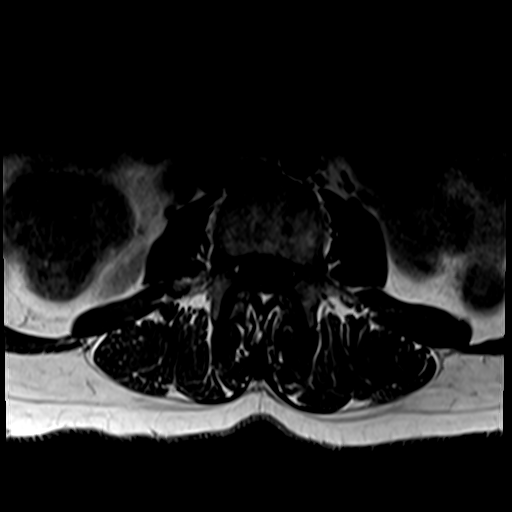
[im 25/36]
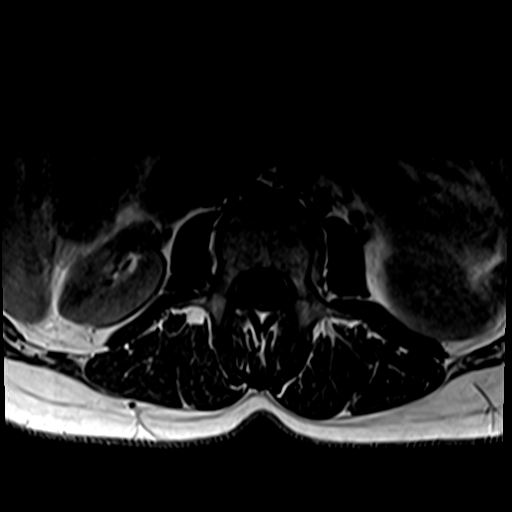
[im 30/36]
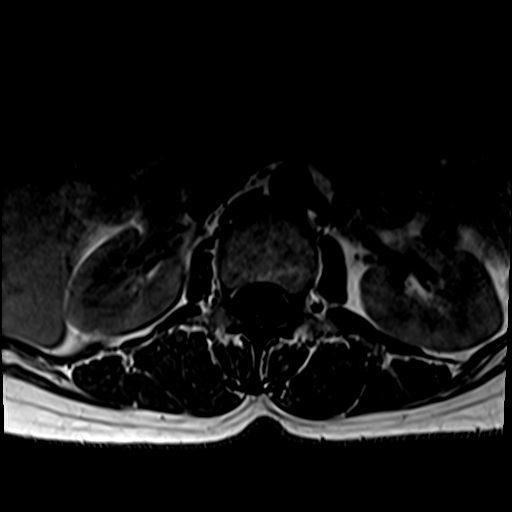
[im 36/36]
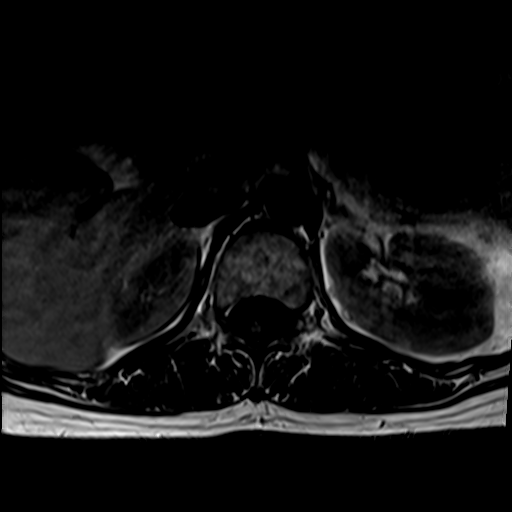

[31 of 48 positions shown; findings below may reference images not displayed]

FINDINGS: Segmentation:  Standard.

Alignment:  Maintained.

Vertebrae:  No fracture.  No worrisome lesion.

Conus medullaris and cauda equina: Conus extends to the T12-L1
level. Conus and cauda equina appear normal.

Paraspinal and other soft tissues: Negative.

Disc levels:

T11-12 is imaged in the sagittal plane only and negative.

T12-L1: Negative.

L1-2: Negative.

L2-3: Negative.

L3-4: Negative.

L4-5: Minimal disc bulge and mild facet arthropathy.  No stenosis.

L5-S1: Minimal disc bulge. Anomalous facets on the right which are
partially fused are unchanged. The central canal and foramina are
open.
IMPRESSION: No change in the appearance of the lumbar spine since the prior MRI.

Anomalous facets on the right at L5-S1 which are partially fused.
There is no stenosis at this level.

Minimal disc bulge L4-5 without stenosis.

## 2019-08-10 ENCOUNTER — Telehealth: Payer: Self-pay

## 2019-08-10 ENCOUNTER — Telehealth: Payer: Self-pay | Admitting: Certified Nurse Midwife

## 2019-08-10 NOTE — Telephone Encounter (Signed)
Patient called saying she was instructed by [facility] to have Doreene Burke put in an order for a breast ultrasound for [facility].   -TC

## 2019-08-10 NOTE — Telephone Encounter (Signed)
mychart message sent to patient

## 2019-08-10 NOTE — Telephone Encounter (Signed)
Casey Hodges,   I need to verify this with [facility] breast center. Typically they would contact us, they do not have the pt do that. I can not take an order from the patient. So We can reach out to [facility] to inquire or we can tell the pt that typically [place] with reach out to [place] with the request.   Thanks  Pattricia Boss

## 2019-08-11 ENCOUNTER — Other Ambulatory Visit: Payer: Self-pay

## 2019-08-11 ENCOUNTER — Other Ambulatory Visit: Payer: Self-pay | Admitting: Certified Nurse Midwife

## 2019-08-11 DIAGNOSIS — N6310 Unspecified lump in the right breast, unspecified quadrant: Secondary | ICD-10-CM

## 2019-08-11 NOTE — Telephone Encounter (Signed)
Patient called again to follow-up with her order for [facility]. Could you please advise patient.   -TC

## 2019-08-12 ENCOUNTER — Other Ambulatory Visit: Payer: Self-pay | Admitting: Certified Nurse Midwife

## 2019-08-12 DIAGNOSIS — R928 Other abnormal and inconclusive findings on diagnostic imaging of breast: Secondary | ICD-10-CM

## 2019-08-12 DIAGNOSIS — N6310 Unspecified lump in the right breast, unspecified quadrant: Secondary | ICD-10-CM

## 2019-08-21 ENCOUNTER — Ambulatory Visit
Admission: RE | Admit: 2019-08-21 | Discharge: 2019-08-21 | Disposition: A | Discharge status: Home / Self Care | Payer: Medicare Other | Source: Ambulatory Visit | Attending: Certified Nurse Midwife | Admitting: Certified Nurse Midwife

## 2019-08-21 ENCOUNTER — Other Ambulatory Visit: Payer: Self-pay | Admitting: Certified Nurse Midwife

## 2019-08-21 DIAGNOSIS — R928 Other abnormal and inconclusive findings on diagnostic imaging of breast: Secondary | ICD-10-CM

## 2019-08-21 DIAGNOSIS — N6320 Unspecified lump in the left breast, unspecified quadrant: Secondary | ICD-10-CM

## 2019-08-21 DIAGNOSIS — N6310 Unspecified lump in the right breast, unspecified quadrant: Secondary | ICD-10-CM

## 2019-09-03 ENCOUNTER — Ambulatory Visit (INDEPENDENT_AMBULATORY_CARE_PROVIDER_SITE_OTHER): Payer: Medicare Other

## 2019-09-03 DIAGNOSIS — Z0000 Encounter for general adult medical examination without abnormal findings: Secondary | ICD-10-CM

## 2019-09-03 DIAGNOSIS — Z598 Other problems related to housing and economic circumstances: Secondary | ICD-10-CM | POA: Diagnosis not present

## 2019-09-03 DIAGNOSIS — Z599 Problem related to housing and economic circumstances, unspecified: Secondary | ICD-10-CM

## 2019-09-03 NOTE — Patient Instructions (Signed)
[name] , Thank you for taking time to come for your Medicare Wellness Visit. I appreciate your ongoing commitment to your health goals. Please review the following plan we discussed and let me know if I can assist you in the future.   Screening recommendations/referrals: Mammogram: done 08/21/19 Recommended yearly ophthalmology/optometry visit for glaucoma screening and checkup Recommended yearly dental visit for hygiene and checkup  Vaccinations: Influenza vaccine: postponed Pneumococcal vaccine: done 09/22/15 Tdap vaccine: done 07/02/17  Advanced directives: Advance directive discussed with you today. Even though you declined this today please call our office should you change your mind and we can give you the proper paperwork for you to fill out.  Conditions/risks identified: If you wish to quit smoking, help is available. For free tobacco cessation program offerings call the [facility] at 2513696184 or Live Well Line at 404-493-2352. You may also visit [email or web address] or email [email or web address] for more information on other programs.    Next appointment: Please schedule next available office visit with Delsa Grana [facility] for routine visit and lab work and follow up in one year for your Medicare Annual Wellness visit.    Preventive Care 40-64 Years, Female Preventive care refers to lifestyle choices and visits with your health care provider that can promote health and wellness. What does preventive care include?  A yearly physical exam. This is also called an annual well check.  Dental exams once or twice a year.  Routine eye exams. Ask your health care provider how often you should have your eyes checked.  Personal lifestyle choices, including:  Daily care of your teeth and gums.  Regular physical activity.  Eating a healthy diet.  Avoiding tobacco and drug use.  Limiting alcohol use.  Practicing safe sex.  Taking low-dose aspirin daily  starting at age 27.  Taking vitamin and mineral supplements as recommended by your health care provider. What happens during an annual well check? The services and screenings done by your health care provider during your annual well check will depend on your age, overall health, lifestyle risk factors, and family history of disease. Counseling  Your health care provider may ask you questions about your:  Alcohol use.  Tobacco use.  Drug use.  Emotional well-being.  Home and relationship well-being.  Sexual activity.  Eating habits.  Work and work Statistician.  Method of birth control.  Menstrual cycle.  Pregnancy history. Screening  You may have the following tests or measurements:  Height, weight, and BMI.  Blood pressure.  Lipid and cholesterol levels. These may be checked every 5 years, or more frequently if you are over 8 years old.  Skin check.  Lung cancer screening. You may have this screening every year starting at age 46 if you have a 30-pack-year history of smoking and currently smoke or have quit within the past 15 years.  Fecal occult blood test (FOBT) of the stool. You may have this test every year starting at age 49.  Flexible sigmoidoscopy or colonoscopy. You may have a sigmoidoscopy every 5 years or a colonoscopy every 10 years starting at age 78.  Hepatitis C blood test.  Hepatitis B blood test.  Sexually transmitted disease (STD) testing.  Diabetes screening. This is done by checking your blood sugar (glucose) after you have not eaten for a while (fasting). You may have this done every 1-3 years.  Mammogram. This may be done every 1-2 years. Talk to your health care provider about when you should start  having regular mammograms. This may depend on whether you have a family history of breast cancer.  BRCA-related cancer screening. This may be done if you have a family history of breast, ovarian, tubal, or peritoneal cancers.  Pelvic exam and  Pap test. This may be done every 3 years starting at age 19. Starting at age 53, this may be done every 5 years if you have a Pap test in combination with an HPV test.  Bone density scan. This is done to screen for osteoporosis. You may have this scan if you are at high risk for osteoporosis. Discuss your test results, treatment options, and if necessary, the need for more tests with your health care provider. Vaccines  Your health care provider may recommend certain vaccines, such as:  Influenza vaccine. This is recommended every year.  Tetanus, diphtheria, and acellular pertussis (Tdap, Td) vaccine. You may need a Td booster every 10 years.  Zoster vaccine. You may need this after age 33.  Pneumococcal 13-valent conjugate (PCV13) vaccine. You may need this if you have certain conditions and were not previously vaccinated.  Pneumococcal polysaccharide (PPSV23) vaccine. You may need one or two doses if you smoke cigarettes or if you have certain conditions. Talk to your health care provider about which screenings and vaccines you need and how often you need them. This information is not intended to replace advice given to you by your health care provider. Make sure you discuss any questions you have with your health care provider. Document Released: 07/08/2015 Document Revised: 02/29/2016 Document Reviewed: 04/12/2015 Elsevier Interactive Patient Education  2017 [employer] Prevention in the Home Falls can cause injuries. They can happen to people of all ages. There are many things you can do to make your home safe and to help prevent falls. What can I do on the outside of my home?  Regularly fix the edges of walkways and driveways and fix any cracks.  Remove anything that might make you trip as you walk through a door, such as a raised step or threshold.  Trim any bushes or trees on the path to your home.  Use bright outdoor lighting.  Clear any walking paths of  anything that might make someone trip, such as rocks or tools.  Regularly check to see if handrails are loose or broken. Make sure that both sides of any steps have handrails.  Any raised decks and porches should have guardrails on the edges.  Have any leaves, snow, or ice cleared regularly.  Use sand or salt on walking paths during winter.  Clean up any spills in your garage right away. This includes oil or grease spills. What can I do in the bathroom?  Use night lights.  Install grab bars by the toilet and in the tub and shower. Do not use towel bars as grab bars.  Use non-skid mats or decals in the tub or shower.  If you need to sit down in the shower, use a plastic, non-slip stool.  Keep the floor dry. Clean up any water that spills on the floor as soon as it happens.  Remove soap buildup in the tub or shower regularly.  Attach bath mats securely with double-sided non-slip rug tape.  Do not have throw rugs and other things on the floor that can make you trip. What can I do in the bedroom?  Use night lights.  Make sure that you have a light by your bed that is easy to  reach.  Do not use any sheets or blankets that are too big for your bed. They should not hang down onto the floor.  Have a firm chair that has side arms. You can use this for support while you get dressed.  Do not have throw rugs and other things on the floor that can make you trip. What can I do in the kitchen?  Clean up any spills right away.  Avoid walking on wet floors.  Keep items that you use a lot in easy-to-reach places.  If you need to reach something above you, use a strong step stool that has a grab bar.  Keep electrical cords out of the way.  Do not use floor polish or wax that makes floors slippery. If you must use wax, use non-skid floor wax.  Do not have throw rugs and other things on the floor that can make you trip. What can I do with my stairs?  Do not leave any items on the  stairs.  Make sure that there are handrails on both sides of the stairs and use them. Fix handrails that are broken or loose. Make sure that handrails are as long as the stairways.  Check any carpeting to make sure that it is firmly attached to the stairs. Fix any carpet that is loose or worn.  Avoid having throw rugs at the top or bottom of the stairs. If you do have throw rugs, attach them to the floor with carpet tape.  Make sure that you have a light switch at the top of the stairs and the bottom of the stairs. If you do not have them, ask someone to add them for you. What else can I do to help prevent falls?  Wear shoes that:  Do not have high heels.  Have rubber bottoms.  Are comfortable and fit you well.  Are closed at the toe. Do not wear sandals.  If you use a stepladder:  Make sure that it is fully opened. Do not climb a closed stepladder.  Make sure that both sides of the stepladder are locked into place.  Ask someone to hold it for you, if possible.  Clearly mark and make sure that you can see:  Any grab bars or handrails.  First and last steps.  Where the edge of each step is.  Use tools that help you move around (mobility aids) if they are needed. These include:  Canes.  Walkers.  Scooters.  Crutches.  Turn on the lights when you go into a dark area. Replace any light bulbs as soon as they burn out.  Set up your furniture so you have a clear path. Avoid moving your furniture around.  If any of your floors are uneven, fix them.  If there are any pets around you, be aware of where they are.  Review your medicines with your doctor. Some medicines can make you feel dizzy. This can increase your chance of falling. Ask your doctor what other things that you can do to help prevent falls. This information is not intended to replace advice given to you by your health care provider. Make sure you discuss any questions you have with your health care  provider. Document Released: 04/07/2009 Document Revised: 11/17/2015 Document Reviewed: 07/16/2014 Elsevier Interactive Patient Education  2017 [employer].

## 2019-09-03 NOTE — Progress Notes (Signed)
Subjective:   Casey Hodges is a 44 y.o. female who presents for Medicare Annual (Subsequent) preventive examination.  Virtual Visit via Telephone Note  I connected with Casey Hodges on 09/03/19 at  2:50 PM EST by telephone and verified that I am speaking with the correct person using two identifiers.  Medicare Annual Wellness visit completed telephonically due to Covid-19 pandemic.   Location: Patient: home Provider: office   I discussed the limitations, risks, security and privacy concerns of performing an evaluation and management service by telephone and the availability of in person appointments. The patient expressed understanding and agreed to proceed.  Some vital signs may be absent or patient reported.   Clemetine Marker, LPN    Review of Systems:   Cardiac Risk Factors include: smoking/ tobacco exposure     Objective:     Vitals: LMP 09/16/2015 Comment: neg preg test  There is no height or weight on file to calculate BMI.  Advanced Directives 08/29/2018 01/10/2018 07/02/2017 04/19/2017 04/19/2017 04/16/2017 01/21/2017  Does Patient Have a Medical Advance Directive? No No No No No No No  Would patient like information on creating a medical advance directive? No - Patient declined - - No - Patient declined - No - Patient declined -    Tobacco Social History   Tobacco Use  Smoking Status Current Every Day Smoker  . Packs/day: 1.00  . Types: Cigarettes  . Start date: 05/18/1995  Smokeless Tobacco Never Used     Ready to quit: Not Answered Counseling given: Not Answered   Clinical Intake:  Pre-visit preparation completed: Yes  Pain : No/denies pain     Nutritional Risks: None Diabetes: No  How often do you need to have someone help you when you read instructions, pamphlets, or other written materials from your doctor or pharmacy?: 1 - Never  Interpreter Needed?: No  Information entered by :: Clemetine Marker LPN  Past Medical History:  Diagnosis Date    . Abnormal vaginal Pap smear    10+ years ago- no colpo repeat was normal  . Anxiety   . AR (allergic rhinitis)   . Bipolar affective ([place])    pt reported  . Bipolar disorder ([place])   . Bursitis of both hips   . Calcium, deposits, in bursa    left hip  . Eating disorder    Under control per patient  . Fibromyalgia   . GERD (gastroesophageal reflux disease)   . Kidney stone   . Painful intercourse   . Painful menstrual periods   . Pelvic pain in female   . PTSD (post-traumatic stress disorder)   . Renal disorder   . Spinal stenosis   . Uterine polyp   . VWD (acquired von Willebrand's disease) [facility])    Past Surgical History:  Procedure Laterality Date  . ABDOMINAL HYSTERECTOMY    . CYSTOSCOPY WITH STENT PLACEMENT Left 04/19/2017   Procedure: CYSTOSCOPY WITH STENT PLACEMENT;  Surgeon: Lucas Mallow, MD;  Location: [facility] ORS;  Service: Urology;  Laterality: Left;  . HYSTEROSCOPY     removed polyps  . LAPAROSCOPIC VAGINAL HYSTERECTOMY  2015   at [facility]  . LAPAROSCOPY Left 01/10/2015   Procedure: LAPAROSCOPY OPERATIVE with biopsy, left oopherectomy;  Surgeon: Brayton Mars, MD;  Location: [facility] ORS;  Service: Gynecology;  Laterality: Left;  . LAPAROSCOPY ABDOMEN DIAGNOSTIC    . OOPHORECTOMY Left   . TUBAL LIGATION    . URETEROSCOPY WITH HOLMIUM LASER LITHOTRIPSY Left 04/19/2017  Procedure: URETEROSCOPY WITH HOLMIUM LASER LITHOTRIPSY;  Surgeon: Crista Elliot, MD;  Location: [facility] ORS;  Service: Urology;  Laterality: Left;   Family History  Problem Relation Age of Onset  . Hypertension Father   . Sleep apnea Mother   . Breast cancer Paternal Grandmother   . Diabetes Maternal Grandmother   . Breast cancer Maternal Grandmother   . Diabetes Maternal Aunt   . Diabetes Maternal Uncle    Social History   Socioeconomic History  . Marital status: Widowed    Spouse name: Not on file  . Number of children: 2  . Years of education: Not on file  . Highest  education level: GED or equivalent  Occupational History  . Occupation: Un-employed due to bipolar  Tobacco Use  . Smoking status: Current Every Day Smoker    Packs/day: 1.00    Types: Cigarettes    Start date: 05/18/1995  . Smokeless tobacco: Never Used  Substance and Sexual Activity  . Alcohol use: No    Alcohol/week: 0.0 standard drinks    Comment: Socially- seldom  . Drug use: No  . Sexual activity: Yes    Partners: Male    Birth control/protection: Surgical  Other Topics Concern  . Not on file  Social History Narrative   Regular Exercise -  NO   Daily Caffeine Use:  1 cup coffee in am      1 child, one step child      4 years ago she watched her husband die slowly before her - resulting in her having PTSD      Social Determinants of Health   Financial Resource Strain: Medium Risk  . Difficulty of Paying Living Expenses: Somewhat hard  Food Insecurity: Food Insecurity Present  . Worried About Programme researcher, broadcasting/film/video in the Last Year: Sometimes true  . Ran Out of Food in the Last Year: Never true  Transportation Needs: No Transportation Needs  . Lack of Transportation (Medical): No  . Lack of Transportation (Non-Medical): No  Physical Activity: Sufficiently Active  . Days of Exercise per Week: 5 days  . Minutes of Exercise per Session: 60 min  Stress: Stress Concern Present  . Feeling of Stress : To some extent  Social Connections: Somewhat Isolated  . Frequency of Communication with Friends and Family: More than three times a week  . Frequency of Social Gatherings with Friends and Family: More than three times a week  . Attends Religious Services: More than 4 times per year  . Active Member of Clubs or Organizations: No  . Attends Banker Meetings: Never  . Marital Status: Widowed    Outpatient Encounter Medications as of 09/03/2019  Medication Sig  . Boric Acid CRYS Place 600 mg vaginally 2 (two) times a week.  . clonazePAM (KLONOPIN) 1 MG tablet  Take 1 mg by mouth 2 (two) times daily.  Marland Kitchen oxcarbazepine (TRILEPTAL) 600 MG tablet Take 600 mg by mouth 2 (two) times daily.  . valACYclovir (VALTREX) 500 MG tablet TAKE 1 TABLET BY MOUTH EVERY DAY  . VRAYLAR capsule Take 1.5 mg by mouth daily.  Marland Kitchen zolpidem (AMBIEN) 10 MG tablet Take 1 tablet by mouth at bedtime as needed.  Marland Kitchen ibuprofen (ADVIL,MOTRIN) 400 MG tablet Take 1 tablet (400 mg total) by mouth every 6 (six) hours as needed.  . [DISCONTINUED] doxycycline (VIBRA-TABS) 100 MG tablet Take 1 tablet (100 mg total) by mouth 2 (two) times daily.  . [DISCONTINUED] loratadine (CLARITIN) 10 MG  tablet Take 1 tablet (10 mg total) by mouth daily. (Patient not taking: Reported on 06/04/2019)  . [DISCONTINUED] triamcinolone cream (KENALOG) 0.1 % Apply 1 application topically 2 (two) times daily.   No facility-administered encounter medications on file as of 09/03/2019.    Activities of Daily Living In your present state of health, do you have any difficulty performing the following activities: 09/03/2019 06/04/2019  Hearing? N N  Comment declines hearing aids -  Vision? Y N  Difficulty concentrating or making decisions? N N  Walking or climbing stairs? N N  Dressing or bathing? N N  Doing errands, shopping? N N  Preparing Food and eating ? N -  Using the Toilet? N -  In the past six months, have you accidently leaked urine? N -  Do you have problems with loss of bowel control? N -  Managing your Medications? N -  Managing your Finances? N -  Housekeeping or managing your Housekeeping? N -  Some recent data might be hidden    Patient Care Team: Danelle Berry, PA-C as PCP - General (Family Medicine) Doreene Burke, CNM as Midwife (Certified Nurse Midwife)    Assessment:   This is a routine wellness examination for Casey Hodges.  Exercise Activities and Dietary recommendations Current Exercise Habits: Home exercise routine, Type of exercise: walking, Time (Minutes): 60, Frequency (Times/Week): 5,  Weekly Exercise (Minutes/Week): 300, Intensity: Moderate, Exercise limited by: None identified  Goals    . smoking cessation     Recommend to decrease smoking from 1 pack (20) to 1/2 pack (10) a day.         Fall Risk Fall Risk  09/03/2019 06/04/2019 08/11/2018 01/21/2017 09/20/2016  Falls in the past year? 0 0 0 Yes No  Number falls in past yr: 0 0 0 2 or more -  Injury with Fall? 0 0 0 No -  Risk Factor Category  - - - High Fall Risk -  Follow up Falls prevention discussed - - Falls prevention discussed -   FALL RISK PREVENTION PERTAINING TO THE HOME:  Any stairs in or around the home? No  If so, do they handrails? No   Home free of loose throw rugs in walkways, pet beds, electrical cords, etc? Yes  Adequate lighting in your home to reduce risk of falls? Yes   ASSISTIVE DEVICES UTILIZED TO PREVENT FALLS:  Life alert? No  Use of a cane, walker or w/c? No  Grab bars in the bathroom? No  Shower chair or bench in shower? No  Elevated toilet seat or a handicapped toilet? No   DME ORDERS:  DME order needed?  No   TIMED UP AND GO:  Was the test performed? No . Telephonic visit.   Education: Fall risk prevention has been discussed.  Intervention(s) required? No    Depression Screen PHQ 2/9 Scores 09/03/2019 06/04/2019 08/11/2018 03/11/2018  PHQ - 2 Score 2 2 6  0  PHQ- 9 Score 15 15 21 8      Cognitive Function 6CIT deferred for 2021 AWV     6CIT Screen 01/21/2017  What Year? 0 points  What month? 0 points  What time? 0 points  Count back from 20 0 points  Months in reverse 0 points  Repeat phrase 4 points  Total Score 4    Immunization History  Administered Date(s) Administered  . Influenza,inj,Quad PF,6+ Mos 03/21/2015, 04/23/2016  . Pneumococcal Polysaccharide-23 09/22/2015  . Tdap 07/02/2017    Qualifies for Shingles Vaccine? No  Tdap: Up to date  Flu Vaccine: Due for Flu vaccine. Does the patient want to receive this vaccine today?  No . Education has  been provided regarding the importance of this vaccine but still declined. Advised may receive this vaccine at local pharmacy or Health Dept. Aware to provide a copy of the vaccination record if obtained from local pharmacy or Health Dept. Verbalized acceptance and understanding.  Pneumococcal Vaccine: Up to date   Screening Tests Health Maintenance  Topic Date Due  . INFLUENZA VACCINE  09/23/2019 (Originally 01/24/2019)  . PAP SMEAR-Modifier  11/10/2019  . MAMMOGRAM  08/20/2020  . TETANUS/TDAP  07/03/2027  . HIV Screening  Completed    Cancer Screenings:  Colorectal Screening: due age 2  Mammogram: Completed 08/21/19. Repeat every year;   Bone Density: due age 24  Lung Cancer Screening: (Low Dose CT Chest recommended if Age 81-80 years, 30 pack-year currently smoking OR have quit w/in 15years.) does not qualify.   Additional Screening:  Hepatitis C Screening: does not qualify;   Vision Screening: Recommended annual ophthalmology exams for early detection of glaucoma and other disorders of the eye. Is the patient up to date with their annual eye exam?  No  Who is the provider or what is the name of the office in which the pt attends annual eye exams? Not established If pt is not established with a provider, would they like to be referred to a provider to establish care? No .   Dental Screening: Recommended annual dental exams for proper oral hygiene  Community Resource Referral:  CRR required this visit?  Yes  - see referral      Plan:      I have personally reviewed and addressed the Medicare Annual Wellness questionnaire and have noted the following in the patient's chart:  A. Medical and social history B. Use of alcohol, tobacco or illicit drugs  C. Current medications and supplements D. Functional ability and status E.  Nutritional status F.  Physical activity G. Advance directives H. List of other physicians I.  Hospitalizations, surgeries, and ER visits in  previous 12 months J.  Vitals K. Screenings such as hearing and vision if needed, cognitive and depression L. Referrals and appointments   In addition, I have reviewed and discussed with patient certain preventive protocols, quality metrics, and best practice recommendations. A written personalized care plan for preventive services as well as general preventive health recommendations were provided to patient.   Signed,  Reather Littler, LPN Nurse Health Advisor   Nurse Notes: Advised patient due for in person office visit and lab work. Pt states she has occasional dizzy spells and notices it happen when she waits too long to eat.   Pt also reports financial difficulties, referral sent to C3 team for resources.

## 2019-09-09 ENCOUNTER — Telehealth: Payer: Self-pay

## 2019-09-09 NOTE — Telephone Encounter (Signed)
Copied from CRM [id number]. Topic: Referral - Status >> Sep 09, 2019 12:20 PM Ricarda Frame D wrote: 09/09/2019 Spoke with patient she confirmed she needed clinic information for vision and dental.  Gave information for [facility] and [facility]. Patient stated she needed assistance with Vraylar but would call me on Friday after seeing her physician who is checking with the Pharmacist. Olean Ree [phone]

## 2019-09-14 ENCOUNTER — Telehealth: Payer: Self-pay

## 2019-09-14 NOTE — Telephone Encounter (Signed)
Copied from CRM #[id number]. Topic: Referral - Status >> Sep 14, 2019 10:11 AM Ricarda Frame D wrote: 09/14/2019 Spoke with patient about making eye and dental appointments.  Assisted patient with calling [facility] and the [facility]. Emailed patient information for [employer].  Patient does not have any other needs. Patient has my contact information should she need any further assistance with this referral.  Closing referral. Olean Ree [phone]

## 2019-09-15 IMAGING — US ULTRASOUND RIGHT BREAST LIMITED
1 series · 13 of 25 positions shown · non-contrast
Comparison: Baseline screening mammogram dated 07/01/2018.

CLINICAL DATA: Patient was called back from baseline screening
mammogram for a possible mass in the right breast.

EXAM:
DIGITAL DIAGNOSTIC RIGHT MAMMOGRAM WITH TOMO
ULTRASOUND RIGHT BREAST

[Series 1: ultrasound right breast limited · 0.05mm/px · 13 of 25 slices shown]
[im 1/25]
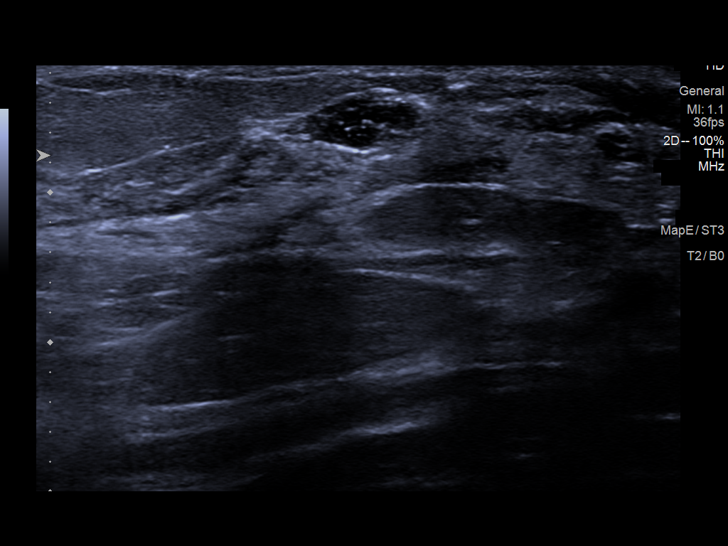
[im 3/25]
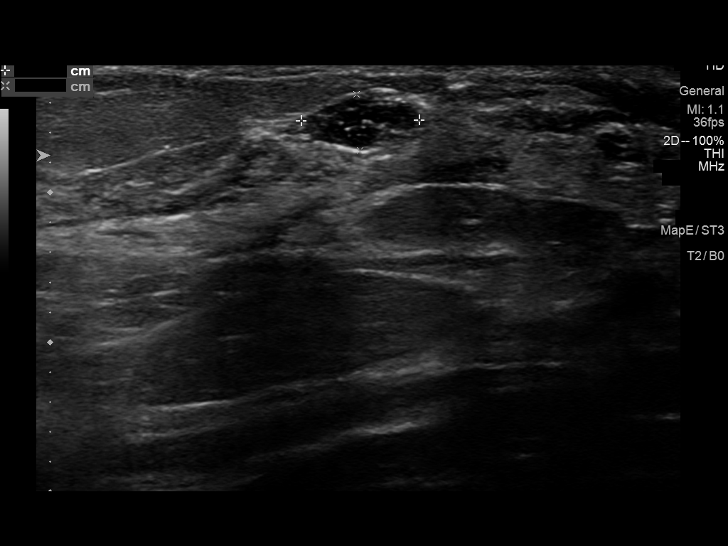
[im 5/25]
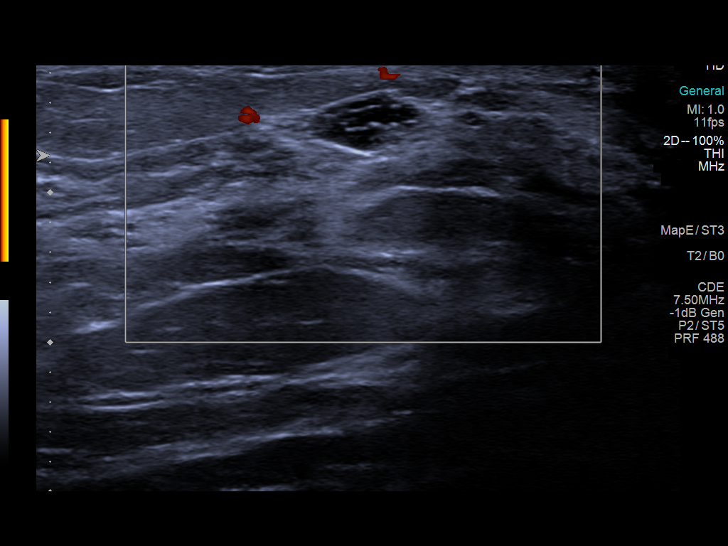
[im 7/25]
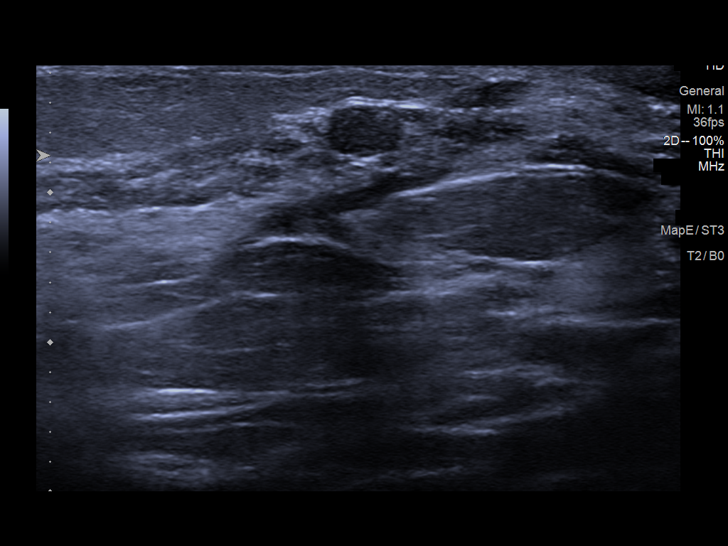
[im 9/25]
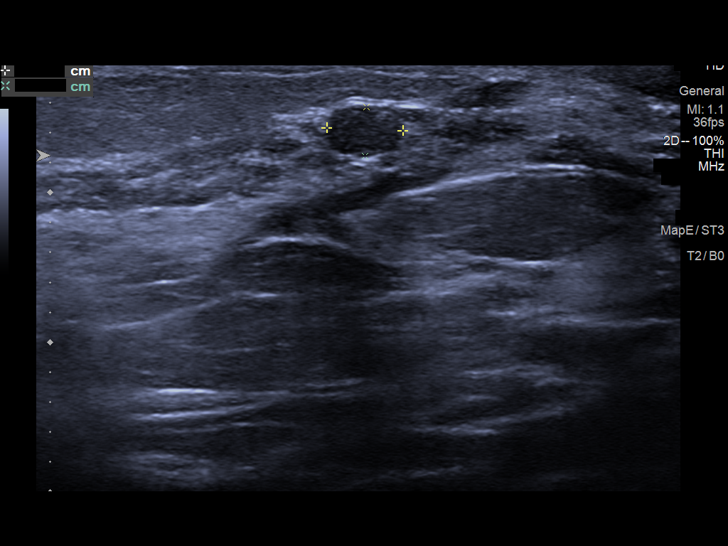
[im 11/25]
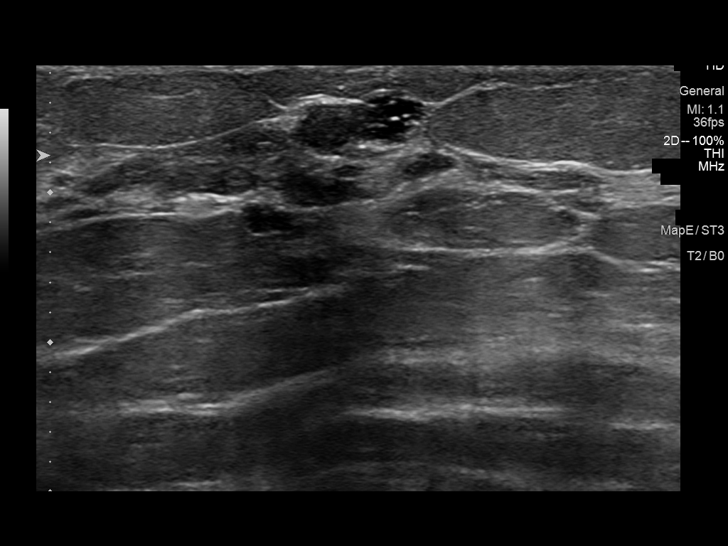
[im 13/25]
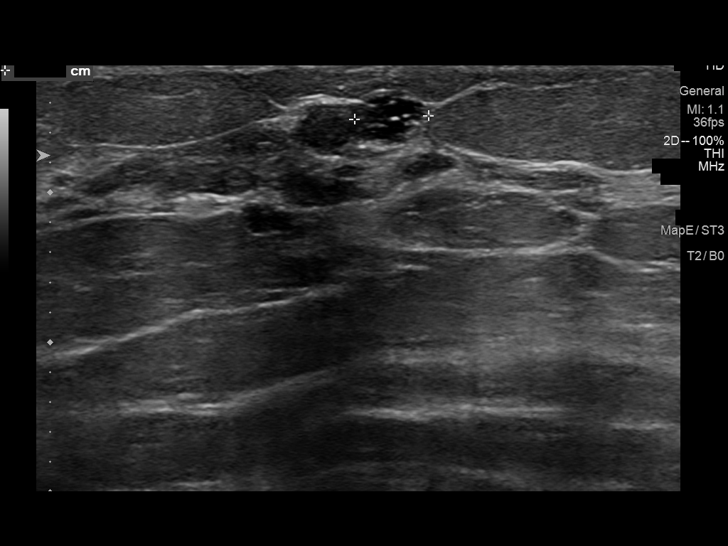
[im 15/25]
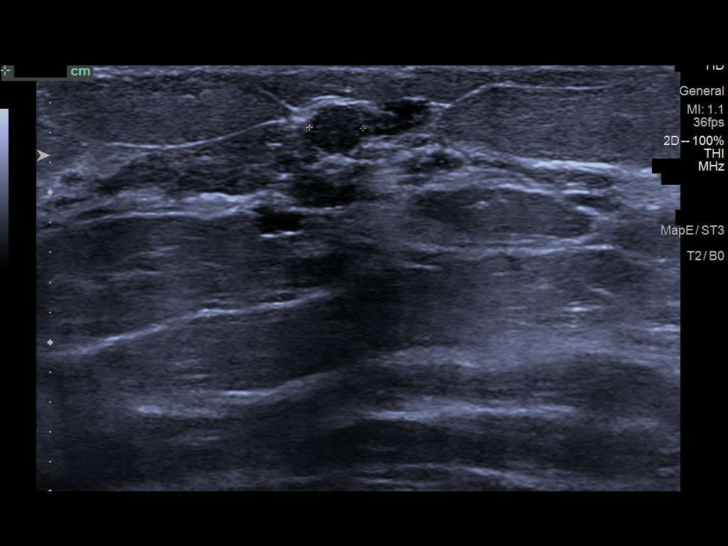
[im 17/25]
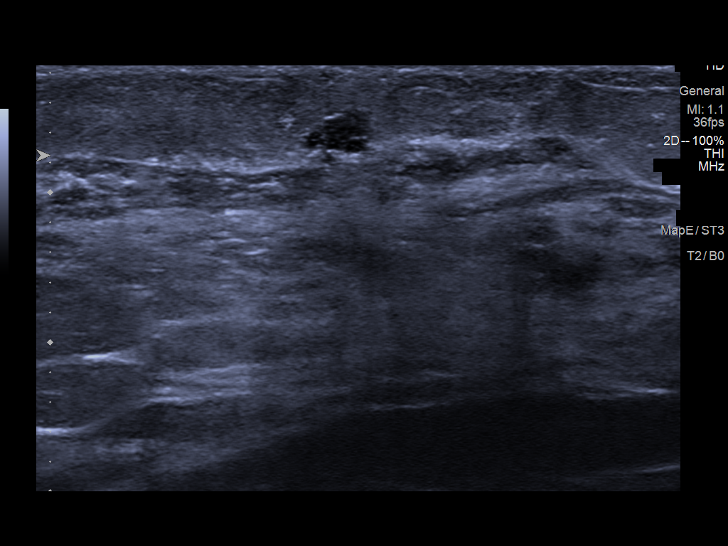
[im 19/25]
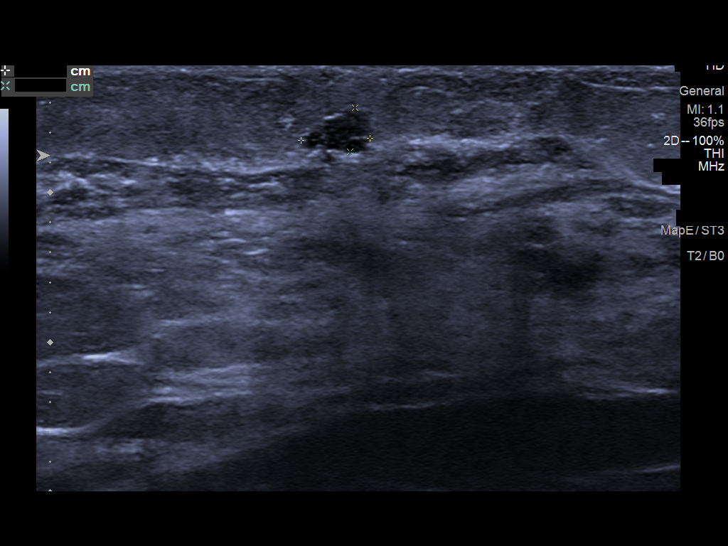
[im 21/25]
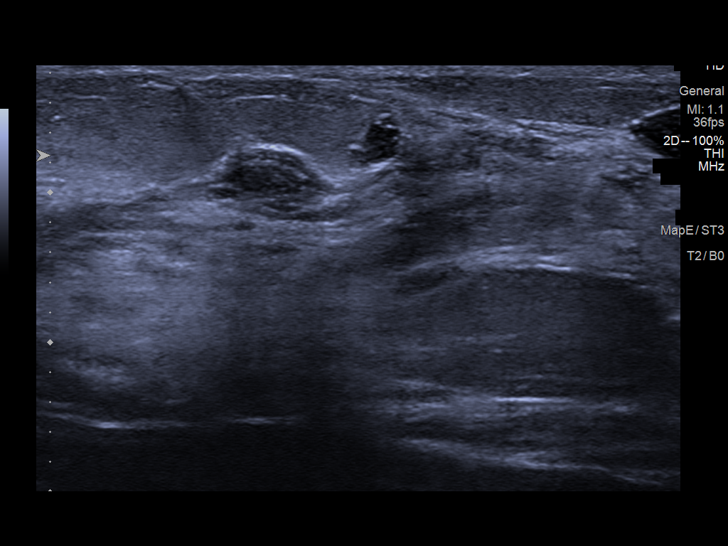
[im 23/25]
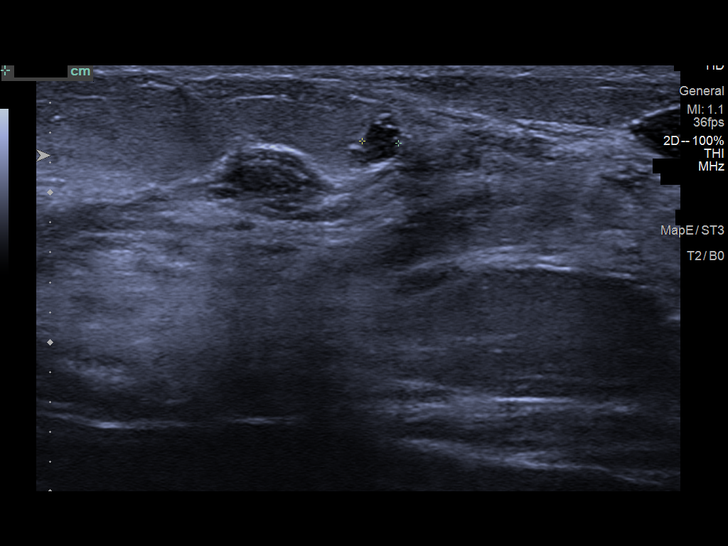
[im 25/25]
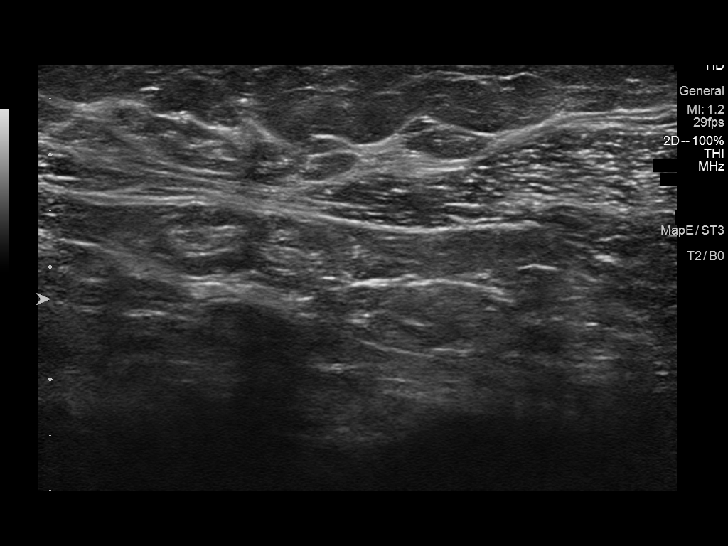

[13 of 25 positions shown; findings below may reference images not displayed]

ACR Breast Density Category c: The breast tissue is heterogeneously
dense, which may obscure small masses.
FINDINGS: Additional imaging of the right breast was performed. The asymmetric
fibroglandular tissue in the lateral aspect of the right breast is
less conspicuous on the additional images. No malignant type
microcalcifications identified.

On physical exam, no mass is palpated in the lateral aspect of the
right breast.

Targeted ultrasound is performed, showing probable cluster of cysts
in the right breast at 10 o'clock in the retroareolar region
measuring 8 x 4 x 5 mm. Directly adjacent to this is a
well-circumscribed hypoechoic mass measuring 4 x 5 x 3 mm. This is
likely a benign fibroadenoma. In the right breast at 10 o'clock 2 cm
from the nipple is probable cluster of cysts measuring 5 x 3 x 2 mm.
IMPRESSION: Probable benign findings in the right breast.

RECOMMENDATION:
Short-term interval follow-up right breast ultrasound in 6 months is
recommended.

I have discussed the findings and recommendations with the patient.
Results were also provided in writing at the conclusion of the
visit. If applicable, a reminder letter will be sent to the patient
regarding the next appointment.

BI-RADS CATEGORY  3: Probably benign.

## 2019-09-23 ENCOUNTER — Encounter: Payer: Self-pay | Admitting: Emergency Medicine

## 2019-09-23 ENCOUNTER — Emergency Department: Payer: Medicare Other

## 2019-09-23 ENCOUNTER — Emergency Department
Admission: EM | Admit: 2019-09-23 | Discharge: 2019-09-23 | Disposition: A | Discharge status: Home / Self Care | Payer: Medicare Other | Attending: Emergency Medicine | Admitting: Emergency Medicine

## 2019-09-23 DIAGNOSIS — Z79899 Other long term (current) drug therapy: Secondary | ICD-10-CM | POA: Diagnosis not present

## 2019-09-23 DIAGNOSIS — F17210 Nicotine dependence, cigarettes, uncomplicated: Secondary | ICD-10-CM | POA: Insufficient documentation

## 2019-09-23 DIAGNOSIS — R109 Unspecified abdominal pain: Secondary | ICD-10-CM | POA: Diagnosis present

## 2019-09-23 LAB — CBC WITH DIFFERENTIAL/PLATELET
Abs Immature Granulocytes: 0.02 10*3/uL (ref 0.00–0.07)
Basophils Absolute: 0 10*3/uL (ref 0.0–0.1)
Basophils Relative: 0 %
Eosinophils Absolute: 0 10*3/uL (ref 0.0–0.5)
Eosinophils Relative: 1 %
HCT: 39.6 % (ref 36.0–46.0)
Hemoglobin: 13.3 g/dL (ref 12.0–15.0)
Immature Granulocytes: 0 %
Lymphocytes Relative: 43 %
Lymphs Abs: 3.8 10*3/uL (ref 0.7–4.0)
MCH: 31 pg (ref 26.0–34.0)
MCHC: 33.6 g/dL (ref 30.0–36.0)
MCV: 92.3 fL (ref 80.0–100.0)
Monocytes Absolute: 0.6 10*3/uL (ref 0.1–1.0)
Monocytes Relative: 6 %
Neutro Abs: 4.3 10*3/uL (ref 1.7–7.7)
Neutrophils Relative %: 50 %
Platelets: 239 10*3/uL (ref 150–400)
RBC: 4.29 MIL/uL (ref 3.87–5.11)
RDW: 12.5 % (ref 11.5–15.5)
WBC: 8.7 10*3/uL (ref 4.0–10.5)
nRBC: 0 % (ref 0.0–0.2)

## 2019-09-23 LAB — URINALYSIS, COMPLETE (UACMP) WITH MICROSCOPIC
Bacteria, UA: NONE SEEN
Bilirubin Urine: NEGATIVE
Glucose, UA: NEGATIVE mg/dL
Ketones, ur: 5 mg/dL — AB
Leukocytes,Ua: NEGATIVE
Nitrite: NEGATIVE
Protein, ur: 30 mg/dL — AB
Specific Gravity, Urine: 1.033 — ABNORMAL HIGH (ref 1.005–1.030)
pH: 5 (ref 5.0–8.0)

## 2019-09-23 LAB — COMPREHENSIVE METABOLIC PANEL
ALT: 14 U/L (ref 0–44)
AST: 13 U/L — ABNORMAL LOW (ref 15–41)
Albumin: 3.9 g/dL (ref 3.5–5.0)
Alkaline Phosphatase: 52 U/L (ref 38–126)
Anion gap: 8 (ref 5–15)
BUN: 21 mg/dL — ABNORMAL HIGH (ref 6–20)
CO2: 22 mmol/L (ref 22–32)
Calcium: 9 mg/dL (ref 8.9–10.3)
Chloride: 108 mmol/L (ref 98–111)
Creatinine, Ser: 0.69 mg/dL (ref 0.44–1.00)
GFR calc Af Amer: >60 mL/min (ref >60)
GFR calc non Af Amer: >60 mL/min (ref >60)
Glucose, Bld: 100 mg/dL — ABNORMAL HIGH (ref 70–99)
Potassium: 3.8 mmol/L (ref 3.5–5.1)
Sodium: 138 mmol/L (ref 135–145)
Total Bilirubin: 0.7 mg/dL (ref 0.3–1.2)
Total Protein: 7.1 g/dL (ref 6.5–8.1)

## 2019-09-23 MED ORDER — ONDANSETRON HCL 4 MG/2ML IJ SOLN
4.0000 mg | Freq: Once | INTRAMUSCULAR | Status: AC
  Administered 2019-09-23: 03:00:00 4 mg via INTRAVENOUS
  Filled 2019-09-23: qty 2

## 2019-09-23 MED ORDER — OXYCODONE-ACETAMINOPHEN 5-325 MG PO TABS
1.0000 | ORAL_TABLET | Freq: Once | ORAL | Status: AC
  Administered 2019-09-23: 04:00:00 1 via ORAL
  Filled 2019-09-23: qty 1

## 2019-09-23 MED ORDER — ONDANSETRON 4 MG PO TBDP
4.0000 mg | ORAL_TABLET | Freq: Once | ORAL | Status: AC
  Administered 2019-09-23: 4 mg via ORAL
  Filled 2019-09-23: qty 1

## 2019-09-23 MED ORDER — HYDROMORPHONE HCL 1 MG/ML IJ SOLN
0.5000 mg | Freq: Once | INTRAMUSCULAR | Status: AC
  Administered 2019-09-23: 0.5 mg via INTRAVENOUS
  Filled 2019-09-23: qty 1

## 2019-09-23 MED ORDER — KETOROLAC TROMETHAMINE 30 MG/ML IJ SOLN
10.0000 mg | Freq: Once | INTRAMUSCULAR | Status: AC
  Administered 2019-09-23: 9.9 mg via INTRAVENOUS
  Filled 2019-09-23: qty 1

## 2019-09-23 MED ORDER — SODIUM CHLORIDE 0.9 % IV BOLUS
1000.0000 mL | Freq: Once | INTRAVENOUS | Status: AC
  Administered 2019-09-23: 03:00:00 1000 mL via INTRAVENOUS

## 2019-09-23 MED ORDER — OXYCODONE-ACETAMINOPHEN 5-325 MG PO TABS: 1.0000 | ORAL_TABLET | ORAL | 0 refills | Status: DC | PRN

## 2019-09-23 MED ORDER — ONDANSETRON 4 MG PO TBDP: 4.0000 mg | ORAL_TABLET | Freq: Three times a day (TID) | ORAL | 0 refills | Status: DC | PRN

## 2019-09-23 NOTE — ED Triage Notes (Signed)
Patient ambulatory to triage with steady gait, without difficulty or distress noted, mask in place; pt reports rt flank pain radiating around into abd accomp by nausea since yesterday

## 2019-09-23 NOTE — ED Provider Notes (Signed)
[facility] Emergency Department Provider Note  ____________________________________________   First MD Initiated Contact with Patient 09/23/19 [phone]     (approximate)  I have reviewed the triage vital signs and the nursing notes.   HISTORY  Chief Complaint Flank Pain   HPI Casey Hodges is a 44 y.o. female who presents to the ED from home with a chief complaint of right flank/abdominal pain. Patient has a history of kidney stones requiring stenting. Admits she drank tea over the weekend and yesterday began to experience right flank pain radiating into her right lower abdomen. Symptoms associated with nausea and dysuria. Denies fever, cough, cp, sob, vomiting, diarrhea. Denies trauma/fall/injury.      Past Medical History:  Diagnosis Date   Abnormal vaginal Pap smear    10+ years ago- no colpo repeat was normal   Anxiety    AR (allergic rhinitis)    Bipolar affective (HCC)    pt reported   Bipolar disorder (HCC)    Bursitis of both hips    Calcium, deposits, in bursa    left hip   Eating disorder    Under control per patient   Fibromyalgia    GERD (gastroesophageal reflux disease)    Kidney stone    Painful intercourse    Painful menstrual periods    Pelvic pain in female    PTSD (post-traumatic stress disorder)    Renal disorder    Spinal stenosis    Uterine polyp    VWD (acquired von Willebrand's disease) [facility])     Patient Active Problem List   Diagnosis Date Noted   Orthostasis 08/24/2016   Antibiotic-induced yeast infection 08/22/2016   Exercise-induced tachycardia 08/22/2016   Constipation due to opioid therapy 11/04/2015   Severe episode of recurrent major depressive disorder, without psychotic features (HCC)    GERD (gastroesophageal reflux disease) 09/16/2015   Smoker 07/25/2015   Snoring 06/09/2015   Cardiac murmur 05/25/2015   Drug-induced nausea and vomiting 03/22/2015   Cyst, bone  02/14/2015   Pelvic adhesive disease 01/21/2015   Arthritis 12/19/2014   Anxiety disorder 12/13/2014   Postural dizziness 12/13/2014   Subcutaneous cyst 12/13/2014   Nephrolithiasis 05/23/2012   Chronic pelvic pain in female 05/23/2012   Fibromyalgia 05/23/2012   Bipolar disorder with depression (HCC) 06/05/2011    Past Surgical History:  Procedure Laterality Date   ABDOMINAL HYSTERECTOMY     CYSTOSCOPY WITH STENT PLACEMENT Left 04/19/2017   Procedure: CYSTOSCOPY WITH STENT PLACEMENT;  Surgeon: Crista Elliot, MD;  Location: [facility] ORS;  Service: Urology;  Laterality: Left;   HYSTEROSCOPY     removed polyps   LAPAROSCOPIC VAGINAL HYSTERECTOMY  2015   at [facility]   LAPAROSCOPY Left 01/10/2015   Procedure: LAPAROSCOPY OPERATIVE with biopsy, left oopherectomy;  Surgeon: Herold Harms, MD;  Location: [facility] ORS;  Service: Gynecology;  Laterality: Left;   LAPAROSCOPY ABDOMEN DIAGNOSTIC     OOPHORECTOMY Left    TUBAL LIGATION     URETEROSCOPY WITH HOLMIUM LASER LITHOTRIPSY Left 04/19/2017   Procedure: URETEROSCOPY WITH HOLMIUM LASER LITHOTRIPSY;  Surgeon: Crista Elliot, MD;  Location: [facility] ORS;  Service: Urology;  Laterality: Left;    Prior to Admission medications   Medication Sig Start Date End Date Taking? Authorizing Provider  Boric Acid CRYS Place 600 mg vaginally 2 (two) times a week. 04/09/19   Doreene Burke, CNM  clonazePAM (KLONOPIN) 1 MG tablet Take 1 mg by mouth 2 (two) times daily. 12/18/17  [facility]  ibuprofen (ADVIL,MOTRIN) 400 MG tablet Take 1 tablet (400 mg total) by mouth every 6 (six) hours as needed. 04/21/17   Dionne Bucy, MD  oxcarbazepine (TRILEPTAL) 600 MG tablet Take 600 mg by mouth 2 (two) times daily. 05/12/19   [provider]  valACYclovir (VALTREX) 500 MG tablet TAKE 1 TABLET BY MOUTH EVERY DAY 04/13/19   Doreene Burke, CNM  VRAYLAR capsule Take 1.5 mg by mouth daily. 05/12/19   [facility]  zolpidem (AMBIEN) 10 MG tablet Take 1 tablet by mouth at bedtime as needed. 03/27/16   [facility]    Allergies Wellbutrin [bupropion hcl], Augmentin [amoxicillin-pot clavulanate], Hydrocodone-acetaminophen, Lasix [furosemide], and Nitrofurantoin monohyd macro  Family History  Problem Relation Age of Onset   Hypertension Father    Sleep apnea Mother    Breast cancer Paternal Grandmother    Diabetes Maternal Grandmother    Breast cancer Maternal Grandmother    Diabetes Maternal Aunt    Diabetes Maternal Uncle     Social History Social History   Tobacco Use   Smoking status: Current Every Day Smoker    Packs/day: 1.00    Types: Cigarettes    Start date: 05/18/1995   Smokeless tobacco: Never Used  Substance Use Topics   Alcohol use: No    Alcohol/week: 0.0 standard drinks    Comment: Socially- seldom   Drug use: No    Review of Systems  Constitutional: No fever/chills Eyes: No visual changes. ENT: No sore throat. Cardiovascular: Denies chest pain. Respiratory: Denies shortness of breath. Gastrointestinal: Positive for right flank and abdominal pain.  Positive for nausea, no vomiting.  No diarrhea.  No constipation. Genitourinary: Negative for dysuria. Musculoskeletal: Negative for back pain. Skin: Negative for rash. Neurological: Negative for headaches, focal weakness or numbness.  ____________________________________________   PHYSICAL EXAM:  VITAL SIGNS: ED Triage Vitals [09/23/19 0111]  Enc Vitals Group     BP 104/63     Pulse Rate 84     Resp      Temp 98.1 F (36.7 C)     Temp Source Oral     SpO2 97 %     Weight      Height      Head Circumference      Peak Flow      Pain Score      Pain Loc      Pain Edu?      Excl. in GC?     Constitutional: Alert and oriented. Well appearing and in mild acute distress. Eyes: Conjunctivae are normal. PERRL. EOMI. Head: Atraumatic. Nose: No  congestion/rhinnorhea. Mouth/Throat: Mucous membranes are moist.  Oropharynx non-erythematous. Neck: No stridor.   Cardiovascular: Normal rate, regular rhythm. Grossly normal heart sounds.  Good peripheral circulation. Respiratory: Normal respiratory effort.  No retractions. Lungs CTAB. Gastrointestinal: Soft and nontender to light or deep palpation. No distention. No abdominal bruits. Mild right CVA tenderness. Musculoskeletal: No lower extremity tenderness nor edema.  No joint effusions. Neurologic:  Normal speech and language. No gross focal neurologic deficits are appreciated. No gait instability. Skin:  Skin is warm, dry and intact. No rash noted. No vesicles. Psychiatric: Mood and affect are normal. Speech and behavior are normal.  ____________________________________________   LABS (all labs ordered are listed, but only abnormal results are displayed)  Labs Reviewed  COMPREHENSIVE METABOLIC PANEL - Abnormal; Notable for the following components:      Result Value   Glucose, Bld 100 (*)  BUN 21 (*)    AST 13 (*)    All other components within normal limits  URINALYSIS, COMPLETE (UACMP) WITH MICROSCOPIC - Abnormal; Notable for the following components:   Color, Urine YELLOW (*)    APPearance HAZY (*)    Specific Gravity, Urine 1.033 (*)    Hgb urine dipstick SMALL (*)    Ketones, ur 5 (*)    Protein, ur 30 (*)    All other components within normal limits  URINE CULTURE  CBC WITH DIFFERENTIAL/PLATELET   ____________________________________________  EKG  None ____________________________________________  RADIOLOGY  ED MD interpretation: Unremarkable CT  Official radiology report(s): CT Renal Stone Study  Result Date: 09/23/2019 CLINICAL DATA:  Right flank pain, nausea, history of kidney stones EXAM: CT ABDOMEN AND PELVIS WITHOUT CONTRAST TECHNIQUE: Multidetector CT imaging of the abdomen and pelvis was performed following the standard protocol without IV contrast.  COMPARISON:  01/04/2018 FINDINGS: Lower chest: Lung bases are clear. Hepatobiliary: Unenhanced liver is unremarkable. Gallbladder is unremarkable. No intrahepatic or extrahepatic ductal dilatation. Pancreas: Within normal limits. Spleen: Within normal limits. Adrenals/Urinary Tract: Adrenal glands are within normal limits. Punctate nonobstructing left lower pole renal calculus (coronal image 67). Kidneys are otherwise within normal limits. No ureteral or bladder calculi. No hydronephrosis. Bladder is within normal limits. Stomach/Bowel: Stomach is within normal limits. No evidence of bowel obstruction. Normal appendix (series 2/image 64). Vascular/Lymphatic: No evidence of abdominal aortic aneurysm. No suspicious abdominopelvic lymphadenopathy. Reproductive: Status post hysterectomy. No adnexal masses. Other: No abdominopelvic ascites. Musculoskeletal: Visualized osseous structures are within normal limits. IMPRESSION: Punctate nonobstructing left lower pole renal calculus. No ureteral or bladder calculi. No hydronephrosis. No evidence of bowel obstruction.  Normal appendix. No CT findings to account for the patient's right flank pain. Electronically Signed   By: Julian Hy M.D.   On: 09/23/2019 03:42    ____________________________________________   PROCEDURES  Procedure(s) performed (including Critical Care):  Procedures   ____________________________________________   INITIAL IMPRESSION / ASSESSMENT AND PLAN / ED COURSE  As part of my medical decision making, I reviewed the following data within the [facility] notes reviewed and incorporated, Labs reviewed, Old chart reviewed, Radiograph reviewed, Notes from prior ED visits and [place] [employer] [place] was evaluated in Emergency Department on 09/23/2019 for the symptoms described in the history of present illness. She was evaluated in the context of the global COVID-19 pandemic, which  necessitated consideration that the patient might be at risk for infection with the SARS-CoV-2 virus that causes COVID-19. Institutional protocols and algorithms that pertain to the evaluation of patients at risk for COVID-19 are in a state of rapid change based on information released by regulatory bodies including the CDC and federal and state organizations. These policies and algorithms were followed during the patient's care in the ED.    44 year old female presenting with right flank/abdominal pain; history of stones. Differential diagnosis includes, but is not limited to, ovarian cyst, ovarian torsion, acute appendicitis, diverticulitis, urinary tract infection/pyelonephritis, endometriosis, bowel obstruction, colitis, renal colic, gastroenteritis, hernia, fibroids, endometriosis, pregnancy related pain including ectopic pregnancy, etc.  Laboratory and urinalysis results unremarkable. Will initiate IV fluid hydration, IV analgesic/antiemetic, and proceed with CT renal colic study.   Clinical Course as of Sep 22 412  Wed Sep 23, 2019  0413 Updated patient on CT result. Strict return precautions given. Patient verbalizes understanding and agrees with plan of care.   [JS]  Clinical Course User Index [JS] Irean Hong, MD     ____________________________________________   FINAL CLINICAL IMPRESSION(S) / ED DIAGNOSES  Final diagnoses:  Right flank pain     ED Discharge Orders    None       Note:  This document was prepared using Dragon voice recognition software and may include unintentional dictation errors.   Irean Hong, MD 09/23/19 [phone]

## 2019-09-23 NOTE — Discharge Instructions (Addendum)
1.  Take Ibuprofen as needed for pain, Percocet as needed for more severe pain. 2.  You may take Zofran as needed for nausea. 3.  Urine culture is pending; you will be notified of any positive results. 4.  Return to the ER for worsening symptoms, persistent vomiting, difficulty breathing or other concerns.

## 2019-09-25 LAB — URINE CULTURE: Culture: 30000 — AB

## 2020-01-15 ENCOUNTER — Telehealth: Payer: Self-pay | Admitting: Certified Nurse Midwife

## 2020-01-15 ENCOUNTER — Telehealth: Payer: Self-pay

## 2020-01-15 ENCOUNTER — Other Ambulatory Visit: Payer: Self-pay

## 2020-01-15 NOTE — Telephone Encounter (Signed)
Pt called in and stated that she is requesting a call back from a nurse. The pt wanted to be seen today but she sees  Pattricia Boss  And she is out of the office. The pt said she has a white discharge, no odor, but she is itching and burning. The pt asked was there something she could take over the counter. I told her I work in the front and I cant answer that ill send a message to the nurse. The pt then asked should  She go to a urgent care, I told her that I cant tell her to go there that is up to her.  I also told the pt that the nurse has 24 to 48 hours to return her call. The pt verbally understood. The pt has an appt with Pattricia Boss at 4 on Monday. Please advise

## 2020-01-15 NOTE — Telephone Encounter (Signed)
mychart message sent to patient

## 2020-01-18 ENCOUNTER — Encounter: Payer: Medicare Other | Admitting: Certified Nurse Midwife

## 2020-01-18 ENCOUNTER — Other Ambulatory Visit (HOSPITAL_COMMUNITY)
Admission: RE | Admit: 2020-01-18 | Discharge: 2020-01-18 | Disposition: A | Discharge status: Home / Self Care | Payer: Medicare Other | Source: Ambulatory Visit | Attending: Certified Nurse Midwife | Admitting: Certified Nurse Midwife

## 2020-01-18 ENCOUNTER — Encounter: Payer: Self-pay | Admitting: Certified Nurse Midwife

## 2020-01-18 ENCOUNTER — Ambulatory Visit (INDEPENDENT_AMBULATORY_CARE_PROVIDER_SITE_OTHER): Payer: Medicare Other | Admitting: Certified Nurse Midwife

## 2020-01-18 VITALS — BP 164/90 | HR 83 | Wt 117.4 lb

## 2020-01-18 DIAGNOSIS — N898 Other specified noninflammatory disorders of vagina: Secondary | ICD-10-CM

## 2020-01-18 NOTE — Patient Instructions (Signed)
Vaginitis Vaginitis is a condition in which the vaginal tissue swells and becomes red (inflamed). This condition is most often caused by a change in the normal balance of bacteria and yeast that live in the vagina. This change causes an overgrowth of certain bacteria or yeast, which causes the inflammation. There are different types of vaginitis, but the most common types are:  Bacterial vaginosis.  Yeast infection (candidiasis).  Trichomoniasis vaginitis. This is a sexually transmitted disease (STD).  Viral vaginitis.  Atrophic vaginitis.  Allergic vaginitis. What are the causes? The cause of this condition depends on the type of vaginitis. It can be caused by:  Bacteria (bacterial vaginosis).  Yeast, which is a fungus (yeast infection).  A parasite (trichomoniasis vaginitis).  A virus (viral vaginitis).  Low hormone levels (atrophic vaginitis). Low hormone levels can occur during pregnancy, breastfeeding, or after menopause.  Irritants, such as bubble baths, scented tampons, and feminine sprays (allergic vaginitis). Other factors can change the normal balance of the yeast and bacteria that live in the vagina. These include:  Antibiotic medicines.  Poor hygiene.  Diaphragms, vaginal sponges, spermicides, birth control pills, and intrauterine devices (IUD).  Sex.  Infection.  Uncontrolled diabetes.  A weakened defense (immune) system. What increases the risk? This condition is more likely to develop in women who:  Smoke.  Use vaginal douches, scented tampons, or scented sanitary pads.  Wear tight-fitting pants.  Wear thong underwear.  Use oral birth control pills or an IUD.  Have sex without a condom.  Have multiple sex partners.  Have an STD.  Frequently use the spermicide nonoxynol-9.  Eat lots of foods high in sugar.  Have uncontrolled diabetes.  Have low estrogen levels.  Have a weakened immune system from an immune disorder or medical  treatment.  Are pregnant or breastfeeding. What are the signs or symptoms? Symptoms vary depending on the cause of the vaginitis. Common symptoms include:  Abnormal vaginal discharge. ? The discharge is white, gray, or yellow with bacterial vaginosis. ? The discharge is thick, white, and cheesy with a yeast infection. ? The discharge is frothy and yellow or greenish with trichomoniasis.  A bad vaginal smell. The smell is fishy with bacterial vaginosis.  Vaginal itching, pain, or swelling.  Sex that is painful.  Pain or burning when urinating. Sometimes there are no symptoms. How is this diagnosed? This condition is diagnosed based on your symptoms and medical history. A physical exam, including a pelvic exam, will also be done. You may also have other tests, including:  Tests to determine the pH level (acidity or alkalinity) of your vagina.  A whiff test, to assess the odor that results when a sample of your vaginal discharge is mixed with a potassium hydroxide solution.  Tests of vaginal fluid. A sample will be examined under a microscope. How is this treated? Treatment varies depending on the type of vaginitis you have. Your treatment may include:  Antibiotic creams or pills to treat bacterial vaginosis and trichomoniasis.  Antifungal medicines, such as vaginal creams or suppositories, to treat a yeast infection.  Medicine to ease discomfort if you have viral vaginitis. Your sexual partner should also be treated.  Estrogen delivered in a cream, pill, suppository, or vaginal ring to treat atrophic vaginitis. If vaginal dryness occurs, lubricants and moisturizing creams may help. You may need to avoid scented soaps, sprays, or douches.  Stopping use of a product that is causing allergic vaginitis. Then using a vaginal cream to treat the symptoms. Follow   these instructions at home: Lifestyle  Keep your genital area clean and dry. Avoid soap, and only rinse the area with  water.  Do not douche or use tampons until your health care provider says it is okay to do so. Use sanitary pads, if needed.  Do not have sex until your health care provider approves. When you can return to sex, practice safe sex and use condoms.  Wipe from front to back. This avoids the spread of bacteria from the rectum to the vagina. General instructions  Take over-the-counter and prescription medicines only as told by your health care provider.  If you were prescribed an antibiotic medicine, take or use it as told by your health care provider. Do not stop taking or using the antibiotic even if you start to feel better.  Keep all follow-up visits as told by your health care provider. This is important. How is this prevented?  Use mild, non-scented products. Do not use things that can irritate the vagina, such as fabric softeners. Avoid the following products if they are scented: ? Feminine sprays. ? Detergents. ? Tampons. ? Feminine hygiene products. ? Soaps or bubble baths.  Let air reach your genital area. ? Wear cotton underwear to reduce moisture buildup. ? Avoid wearing underwear while you sleep. ? Avoid wearing tight pants and underwear or nylons without a cotton panel. ? Avoid wearing thong underwear.  Take off any wet clothing, such as bathing suits, as soon as possible.  Practice safe sex and use condoms. Contact a health care provider if:  You have abdominal pain.  You have a fever.  You have symptoms that last for more than 2-3 days. Get help right away if:  You have a fever and your symptoms suddenly get worse. Summary  Vaginitis is a condition in which the vaginal tissue becomes inflamed.This condition is most often caused by a change in the normal balance of bacteria and yeast that live in the vagina.  Treatment varies depending on the type of vaginitis you have.  Do not douche, use tampons , or have sex until your health care provider approves. When  you can return to sex, practice safe sex and use condoms. This information is not intended to replace advice given to you by your health care provider. Make sure you discuss any questions you have with your health care provider. Document Revised: 05/24/2017 Document Reviewed: 07/17/2016 [employer]vier Patient Education  2020 [employer]  

## 2020-01-18 NOTE — Progress Notes (Signed)
History:     Casey Hodges is a 44 y.o. G3P1001 female here for a routine annual gynecologic exam.  Current complaints: milky white vaginal discharge for the past week. She tried -over the counter medication .   Gynecologic History Patient's last menstrual period was 09/16/2015. Contraception: status post hysterectomy Last Pap n/a complete hysterectomy  Last mammogram:  07/14/18  Results were: abnormal  Obstetric History OB History  Gravida Para Term Preterm AB Living  1 1 1     1   SAB TAB Ectopic Multiple Live Births          1    # Outcome Date GA Lbr Len/2nd Weight Sex Delivery Anes PTL Lv  1 Term 1999   7 lb 9 oz (3.43 kg) F Vag-Spont   LIV    Past Medical History:  Diagnosis Date  . Abnormal vaginal Pap smear    10+ years ago- no colpo repeat was normal  . Anxiety   . AR (allergic rhinitis)   . Bipolar affective (HCC)    pt reported  . Bipolar disorder (HCC)   . Bursitis of both hips   . Calcium, deposits, in bursa    left hip  . Eating disorder    Under control per patient  . Fibromyalgia   . GERD (gastroesophageal reflux disease)   . Kidney stone   . Painful intercourse   . Painful menstrual periods   . Pelvic pain in female   . PTSD (post-traumatic stress disorder)   . Renal disorder   . Spinal stenosis   . Uterine polyp   . VWD (acquired von Willebrand's disease) [facility])     Past Surgical History:  Procedure Laterality Date  . ABDOMINAL HYSTERECTOMY    . CYSTOSCOPY WITH STENT PLACEMENT Left 04/19/2017   Procedure: CYSTOSCOPY WITH STENT PLACEMENT;  Surgeon: 04/21/2017, MD;  Location: [facility] OR[facility];  Service: Urology;  Laterality: Left;  . HYSTEROSCOPY     removed polyps  . LAPAROSCOPIC VAGINAL HYSTERECTOMY  2015   at [facility]  . LAPAROSCOPY Left 01/10/2015   Procedure: LAPAROSCOPY OPERATIVE with biopsy, left oopherectomy;  Surgeon: 01/12/2015, MD;  Location: [facility];  Service: Gynecology;  Laterality: Left;  . LAPAROSCOPY  ABDOMEN DIAGNOSTIC    . OOPHORECTOMY Left   . TUBAL LIGATION    . URETEROSCOPY WITH HOLMIUM LASER LITHOTRIPSY Left 04/19/2017   Procedure: URETEROSCOPY WITH HOLMIUM LASER LITHOTRIPSY;  Surgeon: 04/21/2017, MD;  Location: [facility];  Service: Urology;  Laterality: Left;    Current Outpatient Medications on File Prior to Visit  Medication Sig Dispense Refill  . Boric Acid CRYS Place 600 mg vaginally 2 (two) times a week. 500 g 5  . clonazePAM (KLONOPIN) 1 MG tablet Take 1 mg by mouth 2 (two) times daily.  1  . ibuprofen (ADVIL,MOTRIN) 400 MG tablet Take 1 tablet (400 mg total) by mouth every 6 (six) hours as needed. 20 tablet 0  . ondansetron (ZOFRAN ODT) 4 MG disintegrating tablet Take 1 tablet (4 mg total) by mouth every 8 (eight) hours as needed for nausea or vomiting. 20 tablet 0  . oxcarbazepine (TRILEPTAL) 600 MG tablet Take 600 mg by mouth 2 (two) times daily.    Crista Elliot oxyCODONE-acetaminophen (PERCOCET/ROXICET) 5-325 MG tablet Take 1 tablet by mouth every 4 (four) hours as needed for severe pain. 15 tablet 0  . valACYclovir (VALTREX) 500 MG tablet TAKE 1 TABLET BY MOUTH EVERY  DAY 30 tablet 6  . VRAYLAR capsule Take 1.5 mg by mouth daily.    Marland Kitchen zolpidem (AMBIEN) 10 MG tablet Take 1 tablet by mouth at bedtime as needed.     No current facility-administered medications on file prior to visit.    Allergies  Allergen Reactions  . Wellbutrin [Bupropion Hcl] Other (See Comments)    Reaction:  Suicidal   . Augmentin [Amoxicillin-Pot Clavulanate] Diarrhea, Nausea And Vomiting and Other (See Comments)  . Hydrocodone-Acetaminophen Nausea And Vomiting  . Lasix [Furosemide] Rash  . Nitrofurantoin Monohyd Macro Rash    Social History:  reports that she has been smoking cigarettes. She started smoking about 24 years ago. She has been smoking about 1.00 pack per day. She has never used smokeless tobacco. She reports that she does not drink alcohol and does not use drugs.  Family History    Problem Relation Age of Onset  . Hypertension Father   . Sleep apnea Mother   . Breast cancer Paternal Grandmother   . Diabetes Maternal Grandmother   . Breast cancer Maternal Grandmother   . Diabetes Maternal Aunt   . Diabetes Maternal Uncle     The following portions of the patient's history were reviewed and updated as appropriate: allergies, current medications, past family history, past medical history, past social history, past surgical history and problem list.  Review of Systems Pertinent items noted in HPI and remainder of comprehensive ROS otherwise negative.  Physical Exam:  LMP 09/16/2015 Comment: neg preg test CONSTITUTIONAL: Well-developed, well-nourished female in no acute distress.  HENT:  Normocephalic, atraumatic, External right and left ear normal. Oropharynx is clear and moist SKIN: Skin is warm and dry. No rash noted. Not diaphoretic. No erythema. No pallor.   PSYCHIATRIC: Normal mood and affect. Normal behavior. Normal judgment and thought content. CARDIOVASCULAR:not done ABDOMEN: Soft, no distention noted.  No tenderness, rebound or guarding.  PELVIC: Normal appearing external genitalia and urethral meatus; normal appearing vaginal mucosa .  White discharge , no odorl.    Assessment and Plan:  Vaginal discharge, swab collected. Will follow up with results. Sample boric acid vaginal suppository given.       Doreene Burke, CNM

## 2020-01-20 LAB — CERVICOVAGINAL ANCILLARY ONLY
Bacterial Vaginitis (gardnerella): NEGATIVE
Candida Glabrata: NEGATIVE
Candida Vaginitis: NEGATIVE
Chlamydia: NEGATIVE
Comment: NEGATIVE
Comment: NEGATIVE
Comment: NEGATIVE
Comment: NEGATIVE
Comment: NEGATIVE
Comment: NORMAL
Neisseria Gonorrhea: NEGATIVE
Trichomonas: NEGATIVE

## 2020-01-21 ENCOUNTER — Telehealth: Payer: Self-pay | Admitting: Certified Nurse Midwife

## 2020-01-21 NOTE — Telephone Encounter (Signed)
Patient called in stating she wanted to know if her labs results have come in, and if so would there be a prescription called in for her. Could you please advise?

## 2020-01-22 NOTE — Telephone Encounter (Signed)
Noticed pt had already seen and reviewed her test results. Pt stated that AT had already sent her a message concerning her test results.

## 2020-01-24 ENCOUNTER — Other Ambulatory Visit: Payer: Self-pay | Admitting: Certified Nurse Midwife

## 2020-01-25 ENCOUNTER — Telehealth: Payer: Self-pay

## 2020-01-25 NOTE — Telephone Encounter (Signed)
mychart message sent to patient

## 2020-03-16 ENCOUNTER — Telehealth (INDEPENDENT_AMBULATORY_CARE_PROVIDER_SITE_OTHER): Payer: Medicare Other | Admitting: Internal Medicine

## 2020-03-16 ENCOUNTER — Encounter: Payer: Self-pay | Admitting: Internal Medicine

## 2020-03-16 DIAGNOSIS — J069 Acute upper respiratory infection, unspecified: Secondary | ICD-10-CM | POA: Diagnosis not present

## 2020-03-16 DIAGNOSIS — F17200 Nicotine dependence, unspecified, uncomplicated: Secondary | ICD-10-CM | POA: Diagnosis not present

## 2020-03-16 DIAGNOSIS — J00 Acute nasopharyngitis [common cold]: Secondary | ICD-10-CM

## 2020-03-16 MED ORDER — FLUTICASONE PROPIONATE 50 MCG/ACT NA SUSP: 2.0000 | Freq: Every day | NASAL | 3 refills | Status: DC

## 2020-03-16 NOTE — Progress Notes (Signed)
Name: Casey Hodges   MRN: [id number]    DOB: [birth year]   Date:03/16/2020       Progress Note  Subjective  Chief Complaint  Chief Complaint  Patient presents with  . Sinus Problem    Started this weekend, each day she feels she improves but she has a lot of sinus pressure  . Ear pressure  . Cough    feels a little congestion in her chest but she feels she has gotten most of it out    I connected with  [name] on 03/16/20 at  9:00 AM EDT by telephone and verified that I am speaking with the correct person using two identifiers.  I discussed the limitations, risks, security and privacy concerns of performing an evaluation and management service by telephone and the availability of in person appointments. The patient expressed understanding and agreed to proceed. Staff also discussed with the patient that there may be a patient responsible charge related to this service. Patient Location: Home Provider Location: [facility] Additional Individuals present: none  HPI Patient is a 44 year old female patient of Danelle Berry Presents today via phone visit with the above complaints Her symptoms started this past weekend, and notes overall, she feels better presently, but concerned that she has a lot of persistent sinus and ear pressure. Thinks needs abx.  Covid vaccine - not have + cough, not as bad today, + production last couple days noted, min today No marked SOB No fever (99.1 one day on Monday),  + sore throat, not today +mild congestion and pressure felt in her ears, some PND No  loss of smell, loss of taste Min N/ No V + muscle aches - back No marked loose stools/diarrhea No CP, passing out episodes Taking mucus relief pill last night and Nyquil since Sunday,   Comorbid conditions reviewed No asthma/COPD hx,  No h/o DM, heart disease,  morbid obesity Wt Readings from Last 3 Encounters:  01/18/20 117 lb 6.4 oz (53.3 kg)  06/04/19 127 lb 11.2 oz (57.9 kg)  03/16/19 132 lb  1 oz (59.9 kg)   Tobacco-current every day smoker, 1 pack/day   Patient Active Problem List   Diagnosis Date Noted  . Orthostasis 08/24/2016  . Antibiotic-induced yeast infection 08/22/2016  . Exercise-induced tachycardia 08/22/2016  . Constipation due to opioid therapy 11/04/2015  . Severe episode of recurrent major depressive disorder, without psychotic features (HCC)   . GERD (gastroesophageal reflux disease) 09/16/2015  . Smoker 07/25/2015  . Snoring 06/09/2015  . Cardiac murmur 05/25/2015  . Drug-induced nausea and vomiting 03/22/2015  . Cyst, bone 02/14/2015  . Pelvic adhesive disease 01/21/2015  . Arthritis 12/19/2014  . Anxiety disorder 12/13/2014  . Postural dizziness 12/13/2014  . Subcutaneous cyst 12/13/2014  . Nephrolithiasis 05/23/2012  . Chronic pelvic pain in female 05/23/2012  . Fibromyalgia 05/23/2012  . Bipolar disorder with depression (HCC) 06/05/2011    Past Surgical History:  Procedure Laterality Date  . ABDOMINAL HYSTERECTOMY    . CYSTOSCOPY WITH STENT PLACEMENT Left 04/19/2017   Procedure: CYSTOSCOPY WITH STENT PLACEMENT;  Surgeon: Crista Elliot, MD;  Location: [facility];  Service: Urology;  Laterality: Left;  . HYSTEROSCOPY     removed polyps  . LAPAROSCOPIC VAGINAL HYSTERECTOMY  2015   at [facility]  . LAPAROSCOPY Left 01/10/2015   Procedure: LAPAROSCOPY OPERATIVE with biopsy, left oopherectomy;  Surgeon: Herold Harms, MD;  Location: [facility];  Service: Gynecology;  Laterality: Left;  . LAPAROSCOPY  ABDOMEN DIAGNOSTIC    . OOPHORECTOMY Left   . TUBAL LIGATION    . URETEROSCOPY WITH HOLMIUM LASER LITHOTRIPSY Left 04/19/2017   Procedure: URETEROSCOPY WITH HOLMIUM LASER LITHOTRIPSY;  Surgeon: Crista Elliot, MD;  Location: [facility];  Service: Urology;  Laterality: Left;    Family History  Problem Relation Age of Onset  . Hypertension Father   . Sleep apnea Mother   . Breast cancer Paternal Grandmother   . Diabetes Maternal  Grandmother   . Breast cancer Maternal Grandmother   . Diabetes Maternal Aunt   . Diabetes Maternal Uncle     Social History   Tobacco Use  . Smoking status: Current Every Day Smoker    Packs/day: 1.00    Types: Cigarettes    Start date: 05/18/1995  . Smokeless tobacco: Never Used  Substance Use Topics  . Alcohol use: No    Alcohol/week: 0.0 standard drinks    Comment: Socially- seldom     Current Outpatient Medications:  .  amphetamine-dextroamphetamine (ADDERALL XR) 10 MG 24 hr capsule, , Disp: , Rfl:  .  clonazePAM (KLONOPIN) 1 MG tablet, Take 1 mg by mouth 2 (two) times daily., Disp: , Rfl: 1 .  ibuprofen (ADVIL,MOTRIN) 400 MG tablet, Take 1 tablet (400 mg total) by mouth every 6 (six) hours as needed., Disp: 20 tablet, Rfl: 0 .  lidocaine (LIDODERM) 5 %, Lidoderm 5 % topical patch  APPLY 1 PATCH TO SKIN ONCE DAILY **MAY WEAR UP TO 12 HOURS**, Disp: , Rfl:  .  oxcarbazepine (TRILEPTAL) 600 MG tablet, Take 600 mg by mouth 2 (two) times daily., Disp: , Rfl:  .  valACYclovir (VALTREX) 500 MG tablet, TAKE 1 TABLET BY MOUTH EVERY DAY, Disp: 90 tablet, Rfl: 2 .  VRAYLAR capsule, Take 1.5 mg by mouth daily., Disp: , Rfl:  .  Boric Acid CRYS, Place 600 mg vaginally 2 (two) times a week., Disp: 500 g, Rfl: 5 .  ondansetron (ZOFRAN ODT) 4 MG disintegrating tablet, Take 1 tablet (4 mg total) by mouth every 8 (eight) hours as needed for nausea or vomiting., Disp: 20 tablet, Rfl: 0 .  oxyCODONE-acetaminophen (PERCOCET/ROXICET) 5-325 MG tablet, Take 1 tablet by mouth every 4 (four) hours as needed for severe pain., Disp: 15 tablet, Rfl: 0 .  zolpidem (AMBIEN) 10 MG tablet, Take 1 tablet by mouth at bedtime as needed., Disp: , Rfl:   Allergies  Allergen Reactions  . Wellbutrin [Bupropion Hcl] Other (See Comments)    Reaction:  Suicidal   . Augmentin [Amoxicillin-Pot Clavulanate] Diarrhea, Nausea And Vomiting and Other (See Comments)  . Hydrocodone-Acetaminophen Nausea And Vomiting  .  Lasix [Furosemide] Rash  . Nitrofurantoin Monohyd Macro Rash    With staff assistance, above reviewed with the patient today.  ROS: As per HPI, otherwise no specific complaints on a limited and focused system review   Objective  Virtual encounter, vitals not obtained.  There is no height or weight on file to calculate BMI.  Physical Exam   Appears in NAD via conversation, pleasant, no cough during the conversation Breathing: No obvious respiratory distress. Speaking in complete sentences Neurological: Pt is alert, Speech is normal Psychiatric: Patient has a normal mood and affect, behavior is normal. Judgment and thought content normal.   No results found for this or any previous visit (from the past 72 hour(s)).  PHQ2/9: Depression screen [facility] 2/9 03/16/2020 09/03/2019 06/04/2019 08/11/2018 03/11/2018  Decreased Interest 0 0 0 3 0  Down, Depressed, Hopeless  0 2 2 3  0  PHQ - 2 Score 0 2 2 6  0  Altered sleeping - 1 1 1 3   Tired, decreased energy - 2 2 2 3   Change in appetite - 2 3 3  0  Feeling bad or failure about yourself  - 2 2 3  0  Trouble concentrating - 3 3 3 2   Moving slowly or fidgety/restless - 3 2 3  0  Suicidal thoughts - 0 0 0 0  PHQ-9 Score - 15 15 21 8   Difficult doing work/chores - Somewhat difficult Somewhat difficult Extremely dIfficult Somewhat difficult  Some recent data might be hidden   PHQ-2/9 Result reviewed  Fall Risk: Fall Risk  03/16/2020 09/03/2019 06/04/2019 08/11/2018 01/21/2017  Falls in the past year? 0 0 0 0 Yes  Number falls in past yr: 0 0 0 0 2 or more  Injury with Fall? 0 0 0 0 No  Risk Factor Category  - - - - High Fall Risk  Follow up Falls evaluation completed Falls prevention discussed - - Falls prevention discussed     Assessment & Plan  1. Viral upper respiratory tract infection 2. Acute rhinitis Discussed with patient the likely source of her symptoms, which seem consistent with an upper respiratory infection, likely viral.   (Noting the low-grade temp and muscle aches as part of this) In the setting of the Covid pandemic, and she has not been vaccinated, Covid is certainly a possibility and noted that to her today. She does think she is on the mend and has turned the corner some and is feeling better since this started. Explained to her at length why I do not think rushing to an antibiotic is best presently, and would not help if this is a viral infection.  She was understanding of that. She noted the ear pressure is bothersome, and do think she has an element of eustachian tube dysfunction and discussed that with her today Recommended a Flonase product-can use twice daily for the first 3 days, then use once daily. Also she takes ibuprofen products as needed, and can continue, take with food Okay to continue the mucus relief product which seem to be helpful, and has used NyQuil the past few nights as well to help. Emphasized rest and staying well-hydrated to continue Also to stay relatively isolated until symptoms significantly resolved.  - fluticasone (FLONASE) 50 MCG/ACT nasal spray; Place 2 sprays into both nostrils daily. Can use twice daily for first 3 days.  Dispense: 16 g; Refill: 3  3. Tobacco dependence She noted she would like to quit in the near future and encouraged.   Did note if her symptoms tend to worsen again over the next few days, she needs to get a Covid test, and can follow-up as well.  If she gets more severe symptoms with increased shortness of breath, high fevers, more chest congestion and cough, may need to be seen more emergently in an ER setting, and she was understanding of that.  I discussed the assessment and treatment plan with the patient. The patient was provided an opportunity to ask questions and all were answered. The patient agreed with the plan and demonstrated an understanding of the instructions.  Red flags and when to present for emergency care or RTC including fevers,  chest pain, shortness of breath, new/worsening/un-resolving symptoms reviewed with patient at time of visit.   The patient was advised to call back or seek an in-person evaluation if the symptoms worsen or if the  condition fails to improve as anticipated.  I provided 20 minutes of non-face-to-face time during this encounter that included discussing at length patient's sx/history, pertinent pmhx, medications, treatment and follow up plan. This time also included the necessary documentation, orders, and chart review.  Jamelle Haring, MD

## 2020-06-29 ENCOUNTER — Telehealth (INDEPENDENT_AMBULATORY_CARE_PROVIDER_SITE_OTHER): Payer: Medicare Other | Admitting: Internal Medicine

## 2020-06-29 DIAGNOSIS — H6691 Otitis media, unspecified, right ear: Secondary | ICD-10-CM

## 2020-06-29 DIAGNOSIS — J069 Acute upper respiratory infection, unspecified: Secondary | ICD-10-CM | POA: Diagnosis not present

## 2020-06-29 DIAGNOSIS — B379 Candidiasis, unspecified: Secondary | ICD-10-CM

## 2020-06-29 MED ORDER — AMOXICILLIN 500 MG PO CAPS: 500.0000 mg | ORAL_CAPSULE | Freq: Three times a day (TID) | ORAL | 0 refills | Status: DC

## 2020-06-29 MED ORDER — FLUCONAZOLE 150 MG PO TABS: 150.0000 mg | ORAL_TABLET | Freq: Once | ORAL | 0 refills | Status: AC

## 2020-06-29 NOTE — Progress Notes (Signed)
Name: Casey Hodges   MRN: [id number]    DOB: 09-26-1975   Date:06/29/2020       Progress Note  Subjective  Chief Complaint  Chief Complaint  Patient presents with  . Headache  . Otitis Media    Comes and goes.   . Nasal Congestion    I connected with  [name] on 06/29/20 at  2:20 PM EST by telephone and verified that I am speaking with the correct person using two identifiers.  I discussed the limitations, risks, security and privacy concerns of performing an evaluation and management service by telephone and the availability of in person appointments. The patient expressed understanding and agreed to proceed. Staff also discussed with the patient that there may be a patient responsible charge related to this service. Patient Location: in car Provider Location: [facility] Additional Individuals present: none  HPI  Patient is a 45 year old female patient of Casey Hodges Presents today with a phone visit with the above complaints. Noted the marked limitations with this being a phone visit.  Her symptoms started 2.5 weeks ago in her right ear, yesterday the left started to hurt, has had 2-3 HA's - mild in the last 2-3 weeks, sometimes gets dizzy and last night nose running and watery eyes and this is better today. Right one still hurts off and on, not constant, and hypersensitive and sometimes not hear as well in right ear.  Trying ibuprofen prn and can help some.  Now not feel right, a nuisance presently. Kept a cotton ball in it yesterday as felt real sensitive. Denies marked sinus drainage or congestion.  Not have an ear infection since a child, had many when a child, always told has large tonsils.   All - Augmentin noted, and she noted more GI sx's and "hard on my stomach". No real allergy.   Often gets yeast infections with abx.  She requests having a prescription for Diflucan available as that has been helpful for her.  Current everyday smoker, about a pack a day  Patient  Active Problem List   Diagnosis Date Noted  . Orthostasis 08/24/2016  . Antibiotic-induced yeast infection 08/22/2016  . Exercise-induced tachycardia 08/22/2016  . Constipation due to opioid therapy 11/04/2015  . Severe episode of recurrent major depressive disorder, without psychotic features (HCC)   . GERD (gastroesophageal reflux disease) 09/16/2015  . Smoker 07/25/2015  . Snoring 06/09/2015  . Cardiac murmur 05/25/2015  . Drug-induced nausea and vomiting 03/22/2015  . Cyst, bone 02/14/2015  . Pelvic adhesive disease 01/21/2015  . Arthritis 12/19/2014  . Anxiety disorder 12/13/2014  . Postural dizziness 12/13/2014  . Subcutaneous cyst 12/13/2014  . Nephrolithiasis 05/23/2012  . Chronic pelvic pain in female 05/23/2012  . Fibromyalgia 05/23/2012  . Bipolar disorder with depression (HCC) 06/05/2011    Past Surgical History:  Procedure Laterality Date  . ABDOMINAL HYSTERECTOMY    . CYSTOSCOPY WITH STENT PLACEMENT Left 04/19/2017   Procedure: CYSTOSCOPY WITH STENT PLACEMENT;  Surgeon: Crista Elliot, MD;  Location: [facility] ORS;  Service: Urology;  Laterality: Left;  . HYSTEROSCOPY     removed polyps  . LAPAROSCOPIC VAGINAL HYSTERECTOMY  2015   at [facility]  . LAPAROSCOPY Left 01/10/2015   Procedure: LAPAROSCOPY OPERATIVE with biopsy, left oopherectomy;  Surgeon: Herold Harms, MD;  Location: [facility] OR[facility];  Service: Gynecology;  Laterality: Left;  . LAPAROSCOPY ABDOMEN DIAGNOSTIC    . OOPHORECTOMY Left   . TUBAL LIGATION    . URETEROSCOPY  WITH HOLMIUM LASER LITHOTRIPSY Left 04/19/2017   Procedure: URETEROSCOPY WITH HOLMIUM LASER LITHOTRIPSY;  Surgeon: Crista Elliot, MD;  Location: [facility] OR[facility];  Service: Urology;  Laterality: Left;    Family History  Problem Relation Age of Onset  . Hypertension Father   . Sleep apnea Mother   . Breast cancer Paternal Grandmother   . Diabetes Maternal Grandmother   . Breast cancer Maternal Grandmother   . Diabetes Maternal Aunt    . Diabetes Maternal Uncle     Social History   Tobacco Use  . Smoking status: Current Every Day Smoker    Packs/day: 1.00    Types: Cigarettes    Start date: 05/18/1995  . Smokeless tobacco: Never Used  Substance Use Topics  . Alcohol use: No    Alcohol/week: 0.0 standard drinks    Comment: Socially- seldom     Current Outpatient Medications:  .  amphetamine-dextroamphetamine (ADDERALL XR) 10 MG 24 hr capsule, , Disp: , Rfl:  .  amphetamine-dextroamphetamine (ADDERALL XR) 5 MG 24 hr capsule, dextroamphetamine-amphetamine ER 5 mg 24hr capsule,extend release, Disp: , Rfl:  .  clonazePAM (KLONOPIN) 1 MG tablet, Take 1 mg by mouth 2 (two) times daily., Disp: , Rfl: 1 .  fluticasone (FLONASE) 50 MCG/ACT nasal spray, Place 2 sprays into both nostrils daily. Can use twice daily for first 3 days., Disp: 16 g, Rfl: 3 .  ibuprofen (ADVIL,MOTRIN) 400 MG tablet, Take 1 tablet (400 mg total) by mouth every 6 (six) hours as needed., Disp: 20 tablet, Rfl: 0 .  lidocaine (LIDODERM) 5 %, Lidoderm 5 % topical patch  APPLY 1 PATCH TO SKIN ONCE DAILY **MAY WEAR UP TO 12 HOURS**, Disp: , Rfl:  .  oxcarbazepine (TRILEPTAL) 600 MG tablet, Take 600 mg by mouth 2 (two) times daily., Disp: , Rfl:  .  valACYclovir (VALTREX) 500 MG tablet, TAKE 1 TABLET BY MOUTH EVERY DAY, Disp: 90 tablet, Rfl: 2 .  VRAYLAR capsule, Take 1.5 mg by mouth daily., Disp: , Rfl:  .  VRAYLAR capsule, Take 3 mg by mouth daily., Disp: , Rfl:   Allergies  Allergen Reactions  . Wellbutrin [Bupropion Hcl] Other (See Comments)    Reaction:  Suicidal   . Augmentin [Amoxicillin-Pot Clavulanate] Diarrhea, Nausea And Vomiting and Other (See Comments)  . Hydrocodone-Acetaminophen Nausea And Vomiting  . Lasix [Furosemide] Rash  . Nitrofurantoin Monohyd Macro Rash    With staff assistance, above reviewed with the patient today.  ROS: As per HPI, otherwise no specific complaints on a limited and focused system review    Objective  Virtual encounter, vitals not obtained.  There is no height or weight on file to calculate BMI.  Physical Exam   Appears in NAD via conversation, pleasant Pulmonary/Chest: No obvious respiratory distress. Speaking in complete sentences Neurological: Pt is alert, Speech is normal Psychiatric: Patient has a normal mood and affect,  Judgment and thought content normal.   No results found for this or any previous visit (from the past 72 hour(s)).  PHQ2/9: Depression screen [facility] 2/9 06/29/2020 03/16/2020 09/03/2019 06/04/2019 08/11/2018  Decreased Interest 3 0 0 0 3  Down, Depressed, Hopeless 3 0 2 2 3   PHQ - 2 Score 6 0 2 2 6   Altered sleeping 3 - 1 1 1   Tired, decreased energy 3 - 2 2 2   Change in appetite 3 - 2 3 3   Feeling bad or failure about yourself  1 - 2 2 3   Trouble concentrating 3 -  3 3 3   Moving slowly or fidgety/restless 2 - 3 2 3   Suicidal thoughts 1 - 0 0 0  PHQ-9 Score 22 - 15 15 21   Difficult doing work/chores Extremely dIfficult - Somewhat difficult Somewhat difficult Extremely dIfficult  Some recent data might be hidden   PHQ-2/9 Result reviewed, no concerning thoughts for hurting self presently.  Fall Risk: Fall Risk  06/29/2020 03/16/2020 09/03/2019 06/04/2019 08/11/2018  Falls in the past year? 1 0 0 0 0  Number falls in past yr: 0 0 0 0 0  Injury with Fall? 1 0 0 0 0  Risk Factor Category  - - - - -  Follow up - Falls evaluation completed Falls prevention discussed - -     Assessment & Plan  1. Otitis of right ear 2. Upper respiratory tract infection, unspecified type Noted the limitations of this being a phone visit, and patient does note 2-1/2 weeks of symptoms that have mostly been bothersome to her right ear.  She has had some intermittent postnasal drip with perhaps some mild upper respiratory infectious symptoms at one point, although the ear pain and ear fullness feeling has persisted now throughout the last 2-1/2 weeks.  To have concerns  for the possibility of middle ear infection and do feel managing as follows as indicated. Will add in amoxicillin product-500 mg 3 times a day, and her allergy to Augmentin was that it was hard on her stomach with some nausea and vomiting, recommended taking the amoxicillin with food and assess her response.  She was very much in agreement with proceeding with this presently.  He does not have a true allergy to amoxicillin based on her history. Also continue the ibuprofen products as needed as they have been helpful at times. Staying hydrated also important, Also recommended her Flonase product she has at home to use presently, at least once daily. Emphasized to her if her symptoms are not improving or more problematic despite the above, the importance of following up, and do feel she needs to have an actual follow-up visit in person to be assessed if that is the case.  Also noted to her may need ENT involvement over time pending her status as well.  Yeast infection concern with antibiotics-did prescribe Diflucan as she notes she often gets yeast infections with antibiotics, can take 1 if any symptoms are starting to occur, and if not improved over the first few days, can take a second dose.  Await her response to the above.  I discussed the assessment and treatment plan with the patient. The patient was provided an opportunity to ask questions and all were answered. The patient agreed with the plan and demonstrated an understanding of the instructions.   The patient was advised to call back or seek an in-person evaluation if the symptoms worsen or if the condition fails to improve as anticipated.  I provided 20 minutes of non-face-to-face time during this encounter that included discussing at length patient's sx/history, pertinent pmhx, medications, treatment and follow up plan. This time also included the necessary documentation, orders, and chart review.  Towanda Malkin, MD

## 2020-07-06 ENCOUNTER — Telehealth: Payer: Self-pay | Admitting: Family Medicine

## 2020-07-06 NOTE — Telephone Encounter (Unsigned)
Copied from CRM [id number]. Topic: General - Other >> Jul 06, 2020  1:56 PM Casey Hodges A wrote: Patient would like advice and instruction on caring for her mother and possibly visiting her while she is in the hospital. Patient has been experiencing the ear pain for roughly a week and been seen previously for it on 06/30/19

## 2020-07-07 ENCOUNTER — Ambulatory Visit: Payer: Self-pay | Admitting: *Deleted

## 2020-07-07 NOTE — Telephone Encounter (Signed)
Pt needs appt for assessment - cannot call in prescription meds w/o encounter An appt at the respiratory clinic or UC may be appropriate - please call pt and see if she would like to get on schedule for respiratory clinic

## 2020-07-07 NOTE — Telephone Encounter (Signed)
Note sent to [facility] for any pain medication

## 2020-07-07 NOTE — Telephone Encounter (Signed)
Pt is calling in c/o severe headache and body aches due to positive covid.  She is taking Tylenol but it's not helping.     She was told not to take ibuprofen with covid. She has a dry cough. She is concerned about her kidneys due to past kidney problems.   She's afraid to take ibuprofen due to her kidneys and since she has covid.   She is c/o hurting so bad all over her body.   She can't sleep.   "I'm just so tired".  Pt was lethargic sounding and was skipping around in talking about her symptoms.   She did a covid test yesterday and found out she was positive.   Her whole family is sick with covid.  She felt like at times her "air was cut off".   Her conversation at times was scattered.  She is on Amoxicillin and Flonase due to an ear infection.   She was seen on 06/29/2020 via televisit with your office.  She was wondering what to do about the severe headache and body aches.   Tylenol is not touching her pain.  She doesn't have an appetite and a mild dry cough.  I let her know I would send a note to Danelle Berry, PA-C and let her know her symptoms.   She denies any high risk factors other than kidney issues in the past.  She doesn't want to go to the hospital if she doesn't have to.    I sent my notes to [facility] for Danelle Berry.    Reason for Disposition . [1] SEVERE pain AND [2] not improved 2 hours after taking analgesic medication (e.g., ibuprofen or acetaminophen)    Covid positive and having severe body aches and a severe headache that Tylenol is not helping.  Answer Assessment - Initial Assessment Questions 1. LOCATION: "Which ear is involved?"     I have a earache.  Been on antibiotic since 06/29/2020.   I'm having fever now.   I'm having severe chills, hot flashes, body aches.   Not been tested for covid.   I was with my mother in the hospital and she's positive covid.   My whole family is sick with covid.    The headache and body aches are horrible.   I'm using  Flonase.   I'm on Amoxicillin.   My right ear is still painful. I'm using Tylenol and ibuprofen for the fever.  99.7 is highest temp.  Tylenol brings it down to 99.   I'm fatigued really bad.  My mother is in the hospital now with covid.   I was told not to take ibuprofen with covid.     2. ONSET: "When did the ear start hurting"      *No Answer* 3. SEVERITY: "How bad is the pain?"  (Scale 1-10; mild, moderate or severe)   - MILD (1-3): doesn't interfere with normal activities    - MODERATE (4-7): interferes with normal activities or awakens from sleep    - SEVERE (8-10): excruciating pain, unable to do any normal activities      Severe body aches and headache.   4. URI SYMPTOMS: "Do you have a runny nose or cough?"     *No Answer* 5. FEVER: "Do you have a fever?" If Yes, ask: "What is your temperature, how was it measured, and when did it start?"     *No Answer* 6. CAUSE: "Have you been swimming recently?", "How often do you use  Q-TIPS?", "Have you had any recent air travel or scuba diving?"     *No Answer* 7. OTHER SYMPTOMS: "Do you have any other symptoms?" (e.g., headache, stiff neck, dizziness, vomiting, runny nose, decreased hearing)     *No Answer* 8. PREGNANCY: "Is there any chance you are pregnant?" "When was your last menstrual period?"     *No Answer*  Protocols used: EARACHE-A-AH

## 2020-07-07 NOTE — Telephone Encounter (Signed)
Patient called, left VM to return the call to the office if still need to discuss with a nurse.

## 2020-07-07 NOTE — Telephone Encounter (Signed)
FYI

## 2020-07-08 ENCOUNTER — Other Ambulatory Visit: Payer: Medicare Other

## 2020-07-08 NOTE — Telephone Encounter (Signed)
Lvm about  the message from the doctor

## 2020-07-08 NOTE — Telephone Encounter (Signed)
Spoke with pt and she refused an appt at this time, she is feeling a little better today. She did do a rapid test and it came back positive.

## 2020-07-22 ENCOUNTER — Telehealth: Payer: Medicare Other | Admitting: Family Medicine

## 2020-08-06 ENCOUNTER — Emergency Department
Admission: EM | Admit: 2020-08-06 | Discharge: 2020-08-08 | Disposition: A | Discharge status: Home / Self Care | Payer: Medicare Other | Attending: Emergency Medicine | Admitting: Emergency Medicine

## 2020-08-06 DIAGNOSIS — T1491XA Suicide attempt, initial encounter: Secondary | ICD-10-CM | POA: Diagnosis not present

## 2020-08-06 DIAGNOSIS — F1994 Other psychoactive substance use, unspecified with psychoactive substance-induced mood disorder: Secondary | ICD-10-CM

## 2020-08-06 DIAGNOSIS — F319 Bipolar disorder, unspecified: Secondary | ICD-10-CM | POA: Diagnosis present

## 2020-08-06 DIAGNOSIS — T50902A Poisoning by unspecified drugs, medicaments and biological substances, intentional self-harm, initial encounter: Secondary | ICD-10-CM | POA: Diagnosis not present

## 2020-08-06 DIAGNOSIS — R102 Pelvic and perineal pain: Secondary | ICD-10-CM | POA: Insufficient documentation

## 2020-08-06 DIAGNOSIS — F3110 Bipolar disorder, current episode manic without psychotic features, unspecified: Secondary | ICD-10-CM | POA: Diagnosis not present

## 2020-08-06 DIAGNOSIS — Z79899 Other long term (current) drug therapy: Secondary | ICD-10-CM | POA: Diagnosis not present

## 2020-08-06 DIAGNOSIS — F3181 Bipolar II disorder: Secondary | ICD-10-CM | POA: Diagnosis not present

## 2020-08-06 DIAGNOSIS — U071 COVID-19: Secondary | ICD-10-CM | POA: Insufficient documentation

## 2020-08-06 DIAGNOSIS — F17210 Nicotine dependence, cigarettes, uncomplicated: Secondary | ICD-10-CM | POA: Insufficient documentation

## 2020-08-06 DIAGNOSIS — X58XXXA Exposure to other specified factors, initial encounter: Secondary | ICD-10-CM | POA: Insufficient documentation

## 2020-08-06 DIAGNOSIS — Z046 Encounter for general psychiatric examination, requested by authority: Secondary | ICD-10-CM | POA: Diagnosis present

## 2020-08-06 NOTE — ED Triage Notes (Signed)
Patient to ED via EMS for suicide attempt.  Per EMS patient took klonopin 4 tabs, ambien 4 tabs, and unknown amount of alcohol.  EMS gave patient Haldol 5mg  due to patient's aggressive behavior.  Per EMS they were notified by police after patient had posted on facebook and family called the police.

## 2020-08-07 ENCOUNTER — Other Ambulatory Visit: Payer: Self-pay

## 2020-08-07 DIAGNOSIS — F319 Bipolar disorder, unspecified: Secondary | ICD-10-CM | POA: Diagnosis not present

## 2020-08-07 DIAGNOSIS — F1994 Other psychoactive substance use, unspecified with psychoactive substance-induced mood disorder: Secondary | ICD-10-CM | POA: Diagnosis not present

## 2020-08-07 LAB — CBC WITH DIFFERENTIAL/PLATELET
Abs Immature Granulocytes: 0.01 10*3/uL (ref 0.00–0.07)
Basophils Absolute: 0 10*3/uL (ref 0.0–0.1)
Basophils Relative: 0 %
Eosinophils Absolute: 0.1 10*3/uL (ref 0.0–0.5)
Eosinophils Relative: 1 %
HCT: 38.3 % (ref 36.0–46.0)
Hemoglobin: 12.8 g/dL (ref 12.0–15.0)
Immature Granulocytes: 0 %
Lymphocytes Relative: 49 %
Lymphs Abs: 3.7 10*3/uL (ref 0.7–4.0)
MCH: 30.6 pg (ref 26.0–34.0)
MCHC: 33.4 g/dL (ref 30.0–36.0)
MCV: 91.6 fL (ref 80.0–100.0)
Monocytes Absolute: 0.5 10*3/uL (ref 0.1–1.0)
Monocytes Relative: 7 %
Neutro Abs: 3.3 10*3/uL (ref 1.7–7.7)
Neutrophils Relative %: 43 %
Platelets: 249 10*3/uL (ref 150–400)
RBC: 4.18 MIL/uL (ref 3.87–5.11)
RDW: 12 % (ref 11.5–15.5)
WBC: 7.7 10*3/uL (ref 4.0–10.5)
nRBC: 0 % (ref 0.0–0.2)

## 2020-08-07 LAB — ETHANOL: Alcohol, Ethyl (B): 88 mg/dL — ABNORMAL HIGH (ref <10)

## 2020-08-07 LAB — HCG, QUANTITATIVE, PREGNANCY: hCG, Beta Chain, Quant, S: <1 m[IU]/mL (ref <5)

## 2020-08-07 LAB — RESP PANEL BY RT-PCR (FLU A&B, COVID) ARPGX2
Influenza A by PCR: NEGATIVE
Influenza B by PCR: NEGATIVE
SARS Coronavirus 2 by RT PCR: POSITIVE — AB

## 2020-08-07 LAB — SALICYLATE LEVEL: Salicylate Lvl: <7 mg/dL — ABNORMAL LOW (ref 7.0–30.0)

## 2020-08-07 LAB — ACETAMINOPHEN LEVEL: Acetaminophen (Tylenol), Serum: <10 ug/mL — ABNORMAL LOW (ref 10–30)

## 2020-08-07 LAB — POC SARS CORONAVIRUS 2 AG -  ED: SARS Coronavirus 2 Ag: NEGATIVE

## 2020-08-07 NOTE — ED Notes (Signed)
Pt given phone to use 

## 2020-08-07 NOTE — ED Notes (Signed)
Updated father. Father wondering if he can sign her out. Explained IVC to father. Explained that pt will be faxed out to facilities for treatment.

## 2020-08-07 NOTE — Consult Note (Signed)
[facility] Face-to-Face Psychiatry Consult   Reason for Consult:  Psych Evaluation  Referring Physician:  Dr. Don Perking Patient Identification: Casey Hodges:  [id number] Principal Diagnosis: Bipolar disorder with depression [facility]) Diagnosis:  Principal Problem:   Bipolar disorder with depression (HCC)   Total Time spent with patient: 1 hour  Subjective:   Casey Hodges is a 45 y.o. female patient admitted with a suicide attempt. Per triage nurse, patient to ED via EMS for suicide attempt.  Per EMS patient took klonopin 4 tabs, ambien 4 tabs, and unknown amount of alcohol.  EMS gave patient Haldol 5mg  due to patient's aggressive behavior.  Per EMS they were notified by police after patient had posted on facebook and family called the police.  HPI:  Tele Assessment  , 45 y.o., female patient presented to [facility].  Patient seen  by TTS and this provider; chart reviewed and consulted with Dr. [facility] on 08/07/20.  On evaluation Casey Hodges reports that she needs to leave because her mother was dying.  When asked about taking the medication and facebook post, patient states that she had just has an argument with her boyfriend and wanted to get some rest. Patient admits to prior suicide attempt "many years ago".  She was vague with the details of such attempt except to say that she admits to prior hospitalization.    During evaluation Casey Hodges is resting and laying on the bed ; she is lethargic/oriented x 4; solemn/labile; and mood congruent with affect.  Patient is speaking in a muffled tone at moderate volume, and a slowed pace; with poor eye contact.  Her thought process is coherent and relevant; There is no indication that she is currently responding to internal/external stimuli or experiencing delusional thought content.  Patient denies suicidal/self-harm, however, that assertion is untrue as evidence by the events that lead her to this current hospitalization. She denies  homicidal  ideation, psychosis, and paranoia.    Recommendations:  Inpatient psychiatric hospitalization when medically cleared.   Past Psychiatric History: Bipolar disorder  Risk to Self:  yes Risk to Others:  unknown Prior Inpatient Therapy:  yes Prior Outpatient Therapy:  yes  Past Medical History:  Past Medical History:  Diagnosis Date  . Abnormal vaginal Pap smear    10+ years ago- no colpo repeat was normal  . Anxiety   . AR (allergic rhinitis)   . Bipolar affective (HCC)    pt reported  . Bipolar disorder (HCC)   . Bursitis of both hips   . Calcium, deposits, in bursa    left hip  . Eating disorder    Under control per patient  . Fibromyalgia   . GERD (gastroesophageal reflux disease)   . Kidney stone   . Painful intercourse   . Painful menstrual periods   . Pelvic pain in female   . PTSD (post-traumatic stress disorder)   . Renal disorder   . Spinal stenosis   . Uterine polyp   . VWD (acquired von Willebrand's disease) [facility])     Past Surgical History:  Procedure Laterality Date  . ABDOMINAL HYSTERECTOMY    . CYSTOSCOPY WITH STENT PLACEMENT Left 04/19/2017   Procedure: CYSTOSCOPY WITH STENT PLACEMENT;  Surgeon: 04/21/2017, MD;  Location: [facility] ORS;  Service: Urology;  Laterality: Left;  . HYSTEROSCOPY     removed polyps  . LAPAROSCOPIC VAGINAL HYSTERECTOMY  2015   at [facility]  . LAPAROSCOPY Left 01/10/2015   Procedure:  LAPAROSCOPY OPERATIVE with biopsy, left oopherectomy;  Surgeon: Herold Harms, MD;  Location: [facility] ORS;  Service: Gynecology;  Laterality: Left;  . LAPAROSCOPY ABDOMEN DIAGNOSTIC    . OOPHORECTOMY Left   . TUBAL LIGATION    . URETEROSCOPY WITH HOLMIUM LASER LITHOTRIPSY Left 04/19/2017   Procedure: URETEROSCOPY WITH HOLMIUM LASER LITHOTRIPSY;  Surgeon: Crista Elliot, MD;  Location: [facility] ORS;  Service: Urology;  Laterality: Left;   Family History:  Family History  Problem Relation Age of Onset  . Hypertension Father   . Sleep  apnea Mother   . Breast cancer Paternal Grandmother   . Diabetes Maternal Grandmother   . Breast cancer Maternal Grandmother   . Diabetes Maternal Aunt   . Diabetes Maternal Uncle    Family Psychiatric  History: unknown Social History:  Social History   Substance and Sexual Activity  Alcohol Use No  . Alcohol/week: 0.0 standard drinks   Comment: Socially- seldom     Social History   Substance and Sexual Activity  Drug Use No    Social History   Socioeconomic History  . Marital status: Widowed    Spouse name: Not on file  . Number of children: 2  . Years of education: Not on file  . Highest education level: GED or equivalent  Occupational History  . Occupation: Un-employed due to bipolar  Tobacco Use  . Smoking status: Current Every Day Smoker    Packs/day: 1.00    Types: Cigarettes    Start date: 05/18/1995  . Smokeless tobacco: Never Used  Vaping Use  . Vaping Use: Never used  Substance and Sexual Activity  . Alcohol use: No    Alcohol/week: 0.0 standard drinks    Comment: Socially- seldom  . Drug use: No  . Sexual activity: Yes    Partners: Male    Birth control/protection: Surgical  Other Topics Concern  . Not on file  Social History Narrative   Regular Exercise -  NO   Daily Caffeine Use:  1 cup coffee in am      1 child, one step child      4 years ago she watched her husband die slowly before her - resulting in her having PTSD      Social Determinants of Health   Financial Resource Strain: Medium Risk  . Difficulty of Paying Living Expenses: Somewhat hard  Food Insecurity: Food Insecurity Present  . Worried About Programme researcher, broadcasting/film/video in the Last Year: Sometimes true  . Ran Out of Food in the Last Year: Never true  Transportation Needs: No Transportation Needs  . Lack of Transportation (Medical): No  . Lack of Transportation (Non-Medical): No  Physical Activity: Sufficiently Active  . Days of Exercise per Week: 5 days  . Minutes of Exercise  per Session: 60 min  Stress: Stress Concern Present  . Feeling of Stress : To some extent  Social Connections: Moderately Isolated  . Frequency of Communication with Friends and Family: More than three times a week  . Frequency of Social Gatherings with Friends and Family: More than three times a week  . Attends Religious Services: More than 4 times per year  . Active Member of Clubs or Organizations: No  . Attends Banker Meetings: Never  . Marital Status: Widowed   Additional Social History:    Allergies:   Allergies  Allergen Reactions  . Wellbutrin [Bupropion Hcl] Other (See Comments)    Reaction:  Suicidal   . Augmentin [  Amoxicillin-Pot Clavulanate] Diarrhea, Nausea And Vomiting and Other (See Comments)  . Hydrocodone-Acetaminophen Nausea And Vomiting  . Lasix [Furosemide] Rash  . Nitrofurantoin Monohyd Macro Rash    Labs:  Results for orders placed or performed during the hospital encounter of 08/06/20 (from the past 48 hour(s))  CBC with Differential     Status: None   Collection Time: 08/07/20 12:20 AM  Result Value Ref Range   WBC 7.7 4.0 - 10.5 K/uL   RBC 4.18 3.87 - 5.11 MIL/uL   Hemoglobin 12.8 12.0 - 15.0 g/dL   HCT 18.5 63.1 - 49.7 %   MCV 91.6 80.0 - 100.0 fL   MCH 30.6 26.0 - 34.0 pg   MCHC 33.4 30.0 - 36.0 g/dL   RDW 02.6 37.8 - 58.8 %   Platelets 249 150 - 400 K/uL   nRBC 0.0 0.0 - 0.2 %   Neutrophils Relative % 43 %   Neutro Abs 3.3 1.7 - 7.7 K/uL   Lymphocytes Relative 49 %   Lymphs Abs 3.7 0.7 - 4.0 K/uL   Monocytes Relative 7 %   Monocytes Absolute 0.5 0.1 - 1.0 K/uL   Eosinophils Relative 1 %   Eosinophils Absolute 0.1 0.0 - 0.5 K/uL   Basophils Relative 0 %   Basophils Absolute 0.0 0.0 - 0.1 K/uL   Immature Granulocytes 0 %   Abs Immature Granulocytes 0.01 0.00 - 0.07 K/uL    Comment: Performed at [facility], [address] [place], [place] [address]  Ethanol     Status: Abnormal   Collection Time: 08/07/20 12:20  AM  Result Value Ref Range   Alcohol, Ethyl (B) 88 (H) <10 mg/dL    Comment: (NOTE) Lowest detectable limit for serum alcohol is 10 mg/dL.  For medical purposes only. Performed at [facility], [address] [place], [place] [address]   hCG, quantitative, pregnancy     Status: None   Collection Time: 08/07/20 12:20 AM  Result Value Ref Range   hCG, Beta Chain, Quant, S <1 <5 mIU/mL    Comment:          GEST. AGE      CONC.  (mIU/mL)   <=1 WEEK        5 - 50     2 WEEKS       50 - 500     3 WEEKS       100 - 10,000     4 WEEKS     1,000 - 30,000     5 WEEKS     3,500 - 115,000   6-8 WEEKS     12,000 - 270,000    12 WEEKS     15,000 - 220,000        FEMALE AND NON-PREGNANT FEMALE:     LESS THAN 5 mIU/mL Performed at [facility], [address] [place], [place] [address]   Salicylate level     Status: Abnormal   Collection Time: 08/07/20 12:20 AM  Result Value Ref Range   Salicylate Lvl <7.0 (L) 7.0 - 30.0 mg/dL    Comment: Performed at [facility], [address] [place], [place] [address]  Acetaminophen level     Status: Abnormal   Collection Time: 08/07/20 12:20 AM  Result Value Ref Range   Acetaminophen (Tylenol), Serum <10 (L) 10 - 30 ug/mL    Comment: (NOTE) Therapeutic concentrations vary significantly. A range of 10-30 ug/mL  may be an effective concentration  for many patients. However, some  are best treated at concentrations outside of this range. Acetaminophen concentrations >150 ug/mL at 4 hours after ingestion  and >50 ug/mL at 12 hours after ingestion are often associated with  toxic reactions.  Performed at [facility], [address] [place], [place] [address]     No current facility-administered medications for this encounter.   Current Outpatient Medications  Medication Sig Dispense Refill  . amoxicillin (AMOXIL) 500 MG capsule Take 1 capsule (500 mg total) by mouth 3 (three) times daily. 30 capsule 0   . amphetamine-dextroamphetamine (ADDERALL XR) 10 MG 24 hr capsule     . amphetamine-dextroamphetamine (ADDERALL XR) 5 MG 24 hr capsule dextroamphetamine-amphetamine ER 5 mg 24hr capsule,extend release    . clonazePAM (KLONOPIN) 1 MG tablet Take 1 mg by mouth 2 (two) times daily.  1  . fluticasone (FLONASE) 50 MCG/ACT nasal spray Place 2 sprays into both nostrils daily. Can use twice daily for first 3 days. 16 g 3  . ibuprofen (ADVIL,MOTRIN) 400 MG tablet Take 1 tablet (400 mg total) by mouth every 6 (six) hours as needed. 20 tablet 0  . lidocaine (LIDODERM) 5 % Lidoderm 5 % topical patch  APPLY 1 PATCH TO SKIN ONCE DAILY **MAY WEAR UP TO 12 HOURS**    . oxcarbazepine (TRILEPTAL) 600 MG tablet Take 600 mg by mouth 2 (two) times daily.    . valACYclovir (VALTREX) 500 MG tablet TAKE 1 TABLET BY MOUTH EVERY DAY 90 tablet 2  . VRAYLAR capsule Take 1.5 mg by mouth daily.    Marland Kitchen VRAYLAR capsule Take 3 mg by mouth daily.      Musculoskeletal: Strength & Muscle Tone: unable to assess Gait & Station: unable to assess Patient leans: N/A  Psychiatric Specialty Exam: Physical Exam Vitals and nursing note reviewed.  Constitutional:      Appearance: She is ill-appearing and toxic-appearing.  Pulmonary:     Effort: Pulmonary effort is normal.  Musculoskeletal:     Cervical back: Normal range of motion.  Skin:    General: Skin is dry.  Neurological:     Mental Status: She is oriented to person, place, and time.  Psychiatric:        Attention and Perception: She is inattentive.        Mood and Affect: Mood is depressed. Affect is labile and flat.        Speech: Speech is delayed.        Behavior: Behavior is slowed and withdrawn.        Thought Content: Thought content includes suicidal ideation. Thought content includes suicidal plan.        Cognition and Memory: Cognition and memory normal.        Judgment: Judgment is impulsive and inappropriate.     Review of Systems  Blood pressure (!)  111/57, pulse 99, temperature (!) 97.5 F (36.4 C), temperature source Oral, resp. rate 18, height 5\' 5"  (1.651 m), weight 59 kg, last menstrual period 09/16/2015, SpO2 98 %.Body mass index is 21.63 kg/m.  General Appearance: Casual  Eye Contact:  Poor  Speech:  Clear and Coherent and Slow  Volume:  Decreased  Mood:  Depressed and Irritable  Affect:  Congruent  Thought Process:  Coherent and Descriptions of Associations: Intact  Orientation:  Full (Time, Place, and Person)  Thought Content:  Illogical  Suicidal Thoughts:  Yes.  with intent/plan  Homicidal Thoughts:  No  Memory:  Remote;   Fair  Judgement:  Impaired  Insight:  Lacking  Psychomotor Activity:  Normal  Concentration:  Attention Span: Fair  Recall:  FiservFair  Fund of Knowledge:  Fair  Language:  Fair  Akathisia:  NA  Handed:  Right  AIMS (if indicated):     Assets:  Electrical engineerCommunication Skills Resilience Transportation  ADL's:  Intact  Cognition:  WNL  Sleep:        Treatment Plan Summary: Daily contact with patient to assess and evaluate symptoms and progress in treatment and Medication management  -[facility] Hod[facility]  for Bipolar disorder with depression [facility](HCC) crisis management, and stabilization. -Routine labs; which include CBC, CMP, UA, ETOH, Urine pregnancy, HCG, and UDS were reviewed  -medication management: to be determined -Will maintain observation checks every 15 minutes for safety. -Psychosocial education regarding relapse prevention and self-care; Social and communication  -Social work will consult with family for collateral information and discuss discharge and follow up plan.  Disposition: Recommend psychiatric Inpatient admission when medically cleared. Supportive therapy provided about ongoing stressors. Discussed crisis plan, support from social network, calling 911, coming to the Emergency Department, and calling Suicide Hotline.  Jearld Leschashaun M Amrie Gurganus, NP 08/07/2020  1:34 AM

## 2020-08-07 NOTE — ED Notes (Signed)
Pt given breakfast tray

## 2020-08-07 NOTE — BH Assessment (Signed)
Referral information for Psychiatric Hospitalization faxed to;   [facility] Casey Hodges [phone]),   . Casey Hodges [phone]),  . Casey Hodges [phone], [phone], [phone] or [phone]),   . [facility] [phone]),   . Casey Hodges [phone] -or- [phone]),   . Casey Hodges [phone]),   . Casey Hodges [phone]).   [facility] [phone])    [facility] ([phone]--[phone])

## 2020-08-07 NOTE — ED Notes (Signed)
Pt self reported hx:  Pt states her mother has multiple health issues and has been deteriorating significantly over the past few months with several hospitalizations. Pt states that this causes her great stress since her mother is not able to care for herself and the pt has become the sole caregiver. Pt repeatedly expresses concern that by being here, her mother will not be able to receive the care she needs.   Pt states last night she was at dinner with her boyfriend, and they got into an argument. Pt was attempting to discuss her mother's health, and felt that her boyfriend was dismissing her. Pt states she knows she does not handle alcohol well, but had two large margaritas. Pt then reports she had "a bit of a meltdown," and took her prescribed klonopin and [place]. Pt reports she does not like taking the klonopin because she feels like it makes her agitated/labile. Pt reports taking ambien d/t sleeping difficulties.   Pt states she did not take the pills with intent to kill herself, does not give specific reason for taking them aside from "I don't handle alcohol well and was having a bad night." Pt states explicitly that she does not want to hurt herself or others

## 2020-08-07 NOTE — BH Assessment (Addendum)
Referral Check:   [facility]- Per Charge RN Veronique, no bed availability due to staffing    [facility] [phone]) [facility] reports denied due to behaviors (unable to specify behavior's)   [facility] ([phone]) No answer    [facility] [phone], [phone], [phone] or [phone]) No answer, unable to leave a [employer] [phone]) [place] request refax, task completed at [facility] [phone] -or- [phone]) No answer, unable to leave a voicemail    [place] [phone]) Left voicemail    [facility] [phone]) Left voicemail    [facility] [phone]) [place] reports no bed availability at this time    [facility] ([phone]--[phone]) No answer, unable to leave a voicemail

## 2020-08-07 NOTE — ED Notes (Addendum)
Pt gives explicit verbal permission to discuss care with father, listed in chart.

## 2020-08-07 NOTE — BH Assessment (Addendum)
PATIENT IS SCHEDULED FOR ADMISSION TOMORROW MORNING   Patient has been accepted to [facility].  Patient assigned to: DDU Unit  Accepting physician is Dr. Guss Bunde Call report to [phone] Representative was [facility]   ER Staff is aware of it:  French Ana, ER Dereck Leep, ER MD  Anette Riedel, Patient's Nurse  Pt preferred to make contact with her Family/Support System on her own.

## 2020-08-07 NOTE — BH Assessment (Signed)
TTS spoke to pt by phone and provided her an update. Pt is aware of her acceptance to [facility] for INPT but is not happy and expressed the need to return home to care for her mother. Pt denies any current SI/HI/AH/VH and is receptive to talking with Dr. Toni Amend tomorrow morning.   TTS consulted with Dr. Larinda Buttery regarding pt's current positive COVID test as of 08/07/20. Dr Larinda Buttery agrees to complete antigen test to ensure pts results are not residual. TTS will follow up with pt's current team and [facility] accordingly.

## 2020-08-07 NOTE — BH Assessment (Addendum)
Comprehensive Clinical Assessment (CCA) Note  08/07/2020 Casey Hodges [id number] Casey Hodges is a 45year-old patient who was brought to [facility] ED via EMS due to pt ingesting pills. According to the triage note, Patient to ED via EMS for suicide attempt.  Per EMS patient took klonopin 4 tabs, ambien 4 tabs, and unknown amount of alcohol.  EMS gave patient Haldol 5mg  due to patient's aggressive behavior.  Per EMS they were notified by police after patient had posted on Facebook and family called the police. Pt admitted to taking medications and alcohol prior to presenting to the hospital. Pt minimized her actions stating, "I'm here for taking a couple of pills". Pt presented with a labile mood and an irritable affect. Pt had slurred speech and poor eye contact. Pt was guarded and uncooperative with the assessment. Pt had poor judgment and insight. Pt admits to participating in an argument with her father and blamed her actions on the conflict. Pt's protective factors include a supportive family, whom of which referred pt to the hospital. Pt was not openly communicative and was preoccupied about not being hospitalized due to needing to provide transportation for her sick mother to get to her doctor's appointments.    Recommendations for Services/Supports/Treatments: Disposition: Per psych NP Rashaun D., pt. is recommended for inpatient treatment  Chief Complaint:  Chief Complaint  Patient presents with  . Suicide Attempt   Visit Diagnosis: Bipolar II disorder    CCA Screening, Triage and Referral (STR)  Patient Reported Information How did you hear about ? Family/Friend  Referral name: Father  Referral phone number: No data recorded  Whom do you see for routine medical problems? Primary Care  Practice/Facility Name: No data recorded Practice/Facility Phone Number: [phone]  Name of Contact: No data recorded Contact Number: No data recorded Contact Fax Number: No data  recorded Prescriber Name: No data recorded Prescriber Address (if known): No data recorded  What Is the Reason for Your Visit/Call Today? No data recorded How Long Has This Been Causing You Problems? <Week  What Do You Feel Would Help You the Most Today? No data recorded  Have You Recently Been in Any Inpatient Treatment (Hospital/Detox/Crisis Center/28-Day Program)? No  Name/Location of Program/Hospital:No data recorded How Long Were You There? No data recorded When Were You Discharged? No data recorded  Have You Ever Received Services From [facility] Before? No  Who Do You See at [facility]? No data recorded  Have You Recently Had Any Thoughts About Hurting Yourself? Yes  Are You Planning to Commit Suicide/Harm Yourself At This time? Yes   Have you Recently Had Thoughts About Hurting Someone [facility]? No  Explanation: No data recorded  Have You Used Any Alcohol or Drugs in the Past 24 Hours? No  How Long Ago Did You Use Drugs or Alcohol? No data recorded What Did You Use and How Much? No data recorded  Do You Currently Have a Therapist/Psychiatrist? No  Name of Therapist/Psychiatrist: No data recorded  Have You Been Recently Discharged From Any Office Practice or Programs? No  Explanation of Discharge From Practice/Program: No data recorded    CCA Screening Triage Referral Assessment Type of Contact: Face-to-Face  Is this Initial or Reassessment? No data recorded Date Telepsych consult ordered in [facility]:  No data recorded Time Telepsych consult ordered in [facility]:  No data recorded  Patient Reported Information Reviewed? Yes  Patient Left Without Being Seen? No data recorded Reason for Not Completing Assessment: No data recorded  Collateral  Involvement: No data recorded  Does Patient Have a Court Appointed Legal Guardian? No data recorded Name and Contact of Legal Guardian: No data recorded If Minor and Not Living with Parent(s), Who has Custody? No data  recorded Is CPS involved or ever been involved? Never  Is APS involved or ever been involved? Never   Patient Determined To Be At Risk for Harm To Self or Others Based on Review of Patient Reported Information or Presenting Complaint? Yes, for Self-Harm  Method: No data recorded Availability of Means: No data recorded Intent: No data recorded Notification Required: No data recorded Additional Information for Danger to Others Potential: No data recorded Additional Comments for Danger to Others Potential: No data recorded Are There Guns or Other Weapons in Your Home? No data recorded Types of Guns/Weapons: No data recorded Are These Weapons Safely Secured?                            No data recorded Who Could Verify You Are Able To Have These Secured: No data recorded Do You Have any Outstanding Charges, Pending Court Dates, Parole/Probation? No data recorded Contacted To Inform of Risk of Harm To Self or Others: No data recorded  Location of Assessment: [facility] ED   Does Patient Present under Involuntary Commitment? Yes  IVC Papers Initial File Date: 08/07/2020   [place] of Residence: [place]   Patient Currently Receiving the Following Services: Not Receiving Services   Determination of Need: Urgent (48 hours)   Options For Referral: Inpatient Hospitalization     CCA Biopsychosocial Intake/Chief Complaint:  Pill Ingestion  Current Symptoms/Problems: Depression   Patient Reported Schizophrenia/Schizoaffective Diagnosis in Past: No   Strengths: None reported  Preferences: None reported  Abilities: None reported   Type of Services Patient Feels are Needed: None reported   Initial Clinical Notes/Concerns: No data recorded  Mental Health Symptoms Depression:  Hopelessness   Duration of Depressive symptoms: Greater than two weeks   Mania:  Irritability; Recklessness   Anxiety:   Worrying; Tension   Psychosis:  None   Duration of Psychotic symptoms: No  data recorded  Trauma:  Irritability/anger   Obsessions:  None   Compulsions:  None   Inattention:  None   Hyperactivity/Impulsivity:  N/A   Oppositional/Defiant Behaviors:  Resentful; Temper   Emotional Irregularity:  Intense/inappropriate anger   Other Mood/Personality Symptoms:  No data recorded   Mental Status Exam Appearance and self-care  Stature:  Average   Weight:  Average weight   Clothing:  Casual   Grooming:  Normal   Cosmetic use:  Age appropriate   Posture/gait:  Normal   Motor activity:  Not Remarkable   Sensorium  Attention:  Inattentive   Concentration:  Preoccupied   Orientation:  Place; Situation; Person   Recall/memory:  Normal   Affect and Mood  Affect:  Labile   Mood:  Irritable   Relating  Eye contact:  Normal   Facial expression:  Tense   Attitude toward examiner:  Irritable; Guarded   Thought and Language  Speech flow: Slurred   Thought content:  Appropriate to Mood and Circumstances   Preoccupation:  None   Hallucinations:  None   Organization:  No data recorded  [employer] of Knowledge:  Average   Intelligence:  Average   Abstraction:  Normal   Judgement:  Poor   Reality Testing:  Distorted   Insight:  Poor   Decision Making:  Impulsive   Social Functioning  Social Maturity:  Irresponsible   Social Judgement:  Normal   Stress  Stressors:  Family conflict   Coping Ability:  Overwhelmed   Skill Deficits:  Self-control; Decision making; Interpersonal   Supports:  Friends/Service system     Religion:    Leisure/Recreation:    Exercise/Diet:     CCA Employment/Education Employment/Work Situation: Employment / Work Situation Employment situation: Unemployed  Education: Education Is Patient Currently Attending School?: No   CCA Family/Childhood History Family and Relationship History: Family history Marital status: Widowed  Childhood History:      Child/Adolescent Assessment:     CCA Substance Use Alcohol/Drug Use: Alcohol / Drug Use Pain Medications: See PTA Prescriptions: See PTA History of alcohol / drug use?: Yes Negative Consequences of Use: Personal relationships Substance #1 Name of Substance 1: Opioids Substance #2 Name of Substance 2: Alcohol                     ASAM's:  Six Dimensions of Multidimensional Assessment  Dimension 1:  Acute Intoxication and/or Withdrawal Potential:      Dimension 2:  Biomedical Conditions and Complications:      Dimension 3:  Emotional, Behavioral, or Cognitive Conditions and Complications:     Dimension 4:  Readiness to Change:     Dimension 5:  Relapse, Continued use, or Continued Problem Potential:     Dimension 6:  Recovery/Living Environment:     ASAM Severity Score:    ASAM Recommended Level of Treatment:     Substance use Disorder (SUD)    Recommendations for Services/Supports/Treatments: Recommendations for Services/Supports/Treatments Recommendations For Services/Supports/Treatments: Individual Therapy,Medication Management  DSM5 Diagnoses: Patient Active Problem List   Diagnosis Date Noted  . Orthostasis 08/24/2016  . Antibiotic-induced yeast infection 08/22/2016  . Exercise-induced tachycardia 08/22/2016  . Constipation due to opioid therapy 11/04/2015  . Severe episode of recurrent major depressive disorder, without psychotic features (HCC)   . GERD (gastroesophageal reflux disease) 09/16/2015  . Smoker 07/25/2015  . Snoring 06/09/2015  . Cardiac murmur 05/25/2015  . Drug-induced nausea and vomiting 03/22/2015  . Cyst, bone 02/14/2015  . Pelvic adhesive disease 01/21/2015  . Arthritis 12/19/2014  . Anxiety disorder 12/13/2014  . Postural dizziness 12/13/2014  . Subcutaneous cyst 12/13/2014  . Nephrolithiasis 05/23/2012  . Chronic pelvic pain in female 05/23/2012  . Fibromyalgia 05/23/2012  . Bipolar disorder with depression (HCC)  06/05/2011    Kalayna Noy R [place], LCAS

## 2020-08-07 NOTE — ED Notes (Signed)
Unable to perform psych assessment due to patient being uncooperative at this time.

## 2020-08-07 NOTE — ED Provider Notes (Addendum)
[facility] Emergency Department Provider Note  ____________________________________________  Time seen: Approximately 12:18 AM  I have reviewed the triage vital signs and the nursing notes.   HISTORY  Chief Complaint Suicide Attempt   HPI Casey Hodges is a 45 y.o. female with a history of bipolar disorder who presents under IVC suicide attempt.  According to IVC papers patient took 4 Klonopin's, for ambiens and an unknown amount of alcohol in a suicide attempt.  EMS was notified by police after patient posted on Facebook that this was a suicide attempt and family called 911.  IVC papers taken by police.  Patient was very agitated and received 5 mg of IM Haldol in route.  On arrival to the emergency room, she is alert and oriented with a GCS of 15 but extremely intoxicated, cussing and belligerent.  Patient says that she has been under a lot of stress because her mother has been very sick.  She reports "today I had a meltdown and I took 2 Klonopin's and I am prescribed and an [place]."  When asked if patient was trying to commit suicide her answer is no.  She denies any medical complaints.   Past Medical History:  Diagnosis Date  . Abnormal vaginal Pap smear    10+ years ago- no colpo repeat was normal  . Anxiety   . AR (allergic rhinitis)   . Bipolar affective (HCC)    pt reported  . Bipolar disorder (HCC)   . Bursitis of both hips   . Calcium, deposits, in bursa    left hip  . Eating disorder    Under control per patient  . Fibromyalgia   . GERD (gastroesophageal reflux disease)   . Kidney stone   . Painful intercourse   . Painful menstrual periods   . Pelvic pain in female   . PTSD (post-traumatic stress disorder)   . Renal disorder   . Spinal stenosis   . Uterine polyp   . VWD (acquired von Willebrand's disease) [facility])     Patient Active Problem List   Diagnosis Date Noted  . Orthostasis 08/24/2016  . Antibiotic-induced yeast infection  08/22/2016  . Exercise-induced tachycardia 08/22/2016  . Constipation due to opioid therapy 11/04/2015  . Severe episode of recurrent major depressive disorder, without psychotic features (HCC)   . GERD (gastroesophageal reflux disease) 09/16/2015  . Smoker 07/25/2015  . Snoring 06/09/2015  . Cardiac murmur 05/25/2015  . Drug-induced nausea and vomiting 03/22/2015  . Cyst, bone 02/14/2015  . Pelvic adhesive disease 01/21/2015  . Arthritis 12/19/2014  . Anxiety disorder 12/13/2014  . Postural dizziness 12/13/2014  . Subcutaneous cyst 12/13/2014  . Nephrolithiasis 05/23/2012  . Chronic pelvic pain in female 05/23/2012  . Fibromyalgia 05/23/2012  . Bipolar disorder with depression (HCC) 06/05/2011    Past Surgical History:  Procedure Laterality Date  . ABDOMINAL HYSTERECTOMY    . CYSTOSCOPY WITH STENT PLACEMENT Left 04/19/2017   Procedure: CYSTOSCOPY WITH STENT PLACEMENT;  Surgeon: Crista Elliot, MD;  Location: [facility] ORS;  Service: Urology;  Laterality: Left;  . HYSTEROSCOPY     removed polyps  . LAPAROSCOPIC VAGINAL HYSTERECTOMY  2015   at [facility]  . LAPAROSCOPY Left 01/10/2015   Procedure: LAPAROSCOPY OPERATIVE with biopsy, left oopherectomy;  Surgeon: Herold Harms, MD;  Location: [facility] ORS;  Service: Gynecology;  Laterality: Left;  . LAPAROSCOPY ABDOMEN DIAGNOSTIC    . OOPHORECTOMY Left   . TUBAL LIGATION    . URETEROSCOPY  WITH HOLMIUM LASER LITHOTRIPSY Left 04/19/2017   Procedure: URETEROSCOPY WITH HOLMIUM LASER LITHOTRIPSY;  Surgeon: Crista ElliotBell, Eugene D III, MD;  Location: [facility] ORS;  Service: Urology;  Laterality: Left;    Prior to Admission medications   Medication Sig Start Date End Date Taking? Authorizing Provider  amoxicillin (AMOXIL) 500 MG capsule Take 1 capsule (500 mg total) by mouth 3 (three) times daily. 06/29/20   Jamelle HaringHendrickson, Clifford D, MD  amphetamine-dextroamphetamine (ADDERALL XR) 10 MG 24 hr capsule  03/01/20   [provider]   amphetamine-dextroamphetamine (ADDERALL XR) 5 MG 24 hr capsule dextroamphetamine-amphetamine ER 5 mg 24hr capsule,extend release    [provider]  clonazePAM (KLONOPIN) 1 MG tablet Take 1 mg by mouth 2 (two) times daily. 12/18/17   [facility]  fluticasone (FLONASE) 50 MCG/ACT nasal spray Place 2 sprays into both nostrils daily. Can use twice daily for first 3 days. 03/16/20   Jamelle HaringHendrickson, Clifford D, MD  ibuprofen (ADVIL,MOTRIN) 400 MG tablet Take 1 tablet (400 mg total) by mouth every 6 (six) hours as needed. 04/21/17   Dionne BucySiadecki, Sebastian, MD  lidocaine (LIDODERM) 5 % Lidoderm 5 % topical patch  APPLY 1 PATCH TO SKIN ONCE DAILY **MAY WEAR UP TO 12 HOURS**    [provider]  oxcarbazepine (TRILEPTAL) 600 MG tablet Take 600 mg by mouth 2 (two) times daily. 05/12/19   [provider]  valACYclovir (VALTREX) 500 MG tablet TAKE 1 TABLET BY MOUTH EVERY DAY 01/25/20   Doreene Burkehompson, Annie, CNM  VRAYLAR capsule Take 1.5 mg by mouth daily. 05/12/19   [facility]  VRAYLAR capsule Take 3 mg by mouth daily. 06/06/20   [provider]    Allergies Wellbutrin [bupropion hcl], Augmentin [amoxicillin-pot clavulanate], Hydrocodone-acetaminophen, Lasix [furosemide], and Nitrofurantoin monohyd macro  Family History  Problem Relation Age of Onset  . Hypertension Father   . Sleep apnea Mother   . Breast cancer Paternal Grandmother   . Diabetes Maternal Grandmother   . Breast cancer Maternal Grandmother   . Diabetes Maternal Aunt   . Diabetes Maternal Uncle     Social History Social History   Tobacco Use  . Smoking status: Current Every Day Smoker    Packs/day: 1.00    Types: Cigarettes    Start date: 05/18/1995  . Smokeless tobacco: Never Used  Vaping Use  . Vaping Use: Never used  Substance Use Topics  . Alcohol use: No    Alcohol/week: 0.0 standard drinks    Comment: Socially- seldom  . Drug use: No    Review of  Systems  Constitutional: Negative for fever. Eyes: Negative for visual changes. ENT: Negative for sore throat. Neck: No neck pain  Cardiovascular: Negative for chest pain. Respiratory: Negative for shortness of breath. Gastrointestinal: Negative for abdominal pain, vomiting or diarrhea. Genitourinary: Negative for dysuria. Musculoskeletal: Negative for back pain. Skin: Negative for rash. Neurological: Negative for headaches, weakness or numbness. Psych: No SI or HI  ____________________________________________   PHYSICAL EXAM:  VITAL SIGNS: ED Triage Vitals  Enc Vitals Group     BP 08/07/20 0006 (!) 111/57     Pulse Rate 08/07/20 0006 99     Resp 08/07/20 0006 18     Temp 08/07/20 0006 (!) 97.5 F (36.4 C)     Temp Source 08/07/20 0006 Oral     SpO2 08/07/20 0006 98 %     Weight 08/07/20 0000 130 lb (59 kg)     Height 08/07/20 0000 5\' 5"  (1.651 m)  Head Circumference --      Peak Flow --      Pain Score --      Pain Loc --      Pain Edu? --      Excl. in GC? --     Constitutional: Alert and oriented, intoxicated, belligerent HEENT:      Head: Normocephalic and atraumatic.         Eyes: Conjunctivae are normal. Sclera is non-icteric.       Mouth/Throat: Mucous membranes are moist.       Neck: Supple with no signs of meningismus. Cardiovascular: Regular rate and rhythm.  Respiratory: Normal respiratory effort.  Gastrointestinal: Soft, non tender, and non distended. Musculoskeletal: No edema, cyanosis, or erythema of extremities. Neurologic: Slurred speech. Face is symmetric. Moving all extremities. No gross focal neurologic deficits are appreciated. Skin: Skin is warm, dry and intact. No rash noted. Psychiatric: Mood and affect are normal. Speech and behavior are normal.  ____________________________________________   LABS (all labs ordered are listed, but only abnormal results are displayed)  Labs Reviewed  ETHANOL - Abnormal; Notable for the following  components:      Result Value   Alcohol, Ethyl (B) 88 (*)    All other components within normal limits  SALICYLATE LEVEL - Abnormal; Notable for the following components:   Salicylate Lvl <7.0 (*)    All other components within normal limits  ACETAMINOPHEN LEVEL - Abnormal; Notable for the following components:   Acetaminophen (Tylenol), Serum <10 (*)    All other components within normal limits  RESP PANEL BY RT-PCR (FLU A&B, COVID) ARPGX2  CBC WITH DIFFERENTIAL/PLATELET  HCG, QUANTITATIVE, PREGNANCY  URINE DRUG SCREEN, QUALITATIVE ([facility] ONLY)   ____________________________________________  EKG  ED ECG REPORT I, Nita Sickle, the attending physician, personally viewed and interpreted this ECG.  Normal sinus rhythm, rate of 93, normal intervals, right axis deviation, no ST elevations or depressions. ____________________________________________  RADIOLOGY  none  ____________________________________________   PROCEDURES  Procedure(s) performed: None Procedures Critical Care performed:  None ____________________________________________   INITIAL IMPRESSION / ASSESSMENT AND PLAN / ED COURSE  45 y.o. female with a history of bipolar disorder who presents under IVC suicide attempt.  Reportedly patient took 4 Ambien, 4 Klonopin, and an unknown amount of alcohol and posted something on Facebook about this being a suicide attempt.  She arrives to the emergency room extremely intoxicated and belligerent.  At this time she is denying suicide intent.  Patient will remain under IVC.  Psychiatry has been consulted.  Labs for medical clearance and EKG have been ordered.     _________________________ 2:41 AM on 08/07/2020 -----------------------------------------  Patient sleeping comfortably.  Labs for medical clearance with no acute abnormalities.  EKG with normal intervals.  Patient connected to telemetry for close monitoring of cardiorespiratory status.  Has been seen by  psychiatry NP who recommended inpatient admission.  Patient will remain under IVC  The patient has been placed in psychiatric observation due to the need to provide a safe environment for the patient while obtaining psychiatric consultation and evaluation, as well as ongoing medical and medication management to treat the patient's condition.  The patient has been placed under full IVC at this time.  _________________________ 6:14 AM on 08/07/2020 -----------------------------------------  Patient is Covid +, asymptomatic. Reports that she had covid and symptoms 2 weeks ago but symptoms are now gone.  Please note:  Patient was evaluated in Emergency Department today for the symptoms described in the history  of present illness. Patient was evaluated in the context of the global COVID-19 pandemic, which necessitated consideration that the patient might be at risk for infection with the SARS-CoV-2 virus that causes COVID-19. Institutional protocols and algorithms that pertain to the evaluation of patients at risk for COVID-19 are in a state of rapid change based on information released by regulatory bodies including the CDC and federal and state organizations. These policies and algorithms were followed during the patient's care in the ED.  Some ED evaluations and interventions may be delayed as a result of limited staffing during the pandemic.   ____________________________________________   FINAL CLINICAL IMPRESSION(S) / ED DIAGNOSES   Final diagnoses:  Suicide attempt (HCC)  Intentional drug overdose, initial encounter (HCC)  Bipolar 1 disorder (HCC)  COVID-19      NEW MEDICATIONS STARTED DURING THIS VISIT:  ED Discharge Orders    None       Note:  This document was prepared using Dragon voice recognition software and may include unintentional dictation errors.    Don Perking, [place], MD 08/07/20 1749    Nita Sickle, MD 08/07/20 [id number]    Nita Sickle,  MD 08/07/20 [phone]

## 2020-08-07 NOTE — ED Notes (Signed)
Patient is argumentative and cursing at this RN.  Patient refuses to answer any questions at this time.

## 2020-08-08 DIAGNOSIS — F1994 Other psychoactive substance use, unspecified with psychoactive substance-induced mood disorder: Secondary | ICD-10-CM

## 2020-08-08 MED ORDER — NICOTINE 14 MG/24HR TD PT24: 14.0000 mg | MEDICATED_PATCH | Freq: Once | TRANSDERMAL | Status: DC

## 2020-08-08 NOTE — ED Provider Notes (Signed)
Patient discussed with Dr. Toni Amend was just seen and evaluated the patient.  He advises he has rescinded the patient's IVC and recommend she may be discharged to follow-up with her outpatient treatment team.   Sharyn Creamer, MD 08/08/20 1057

## 2020-08-08 NOTE — ED Notes (Signed)
IVC papers rescinded per Dr Toni Amend MD

## 2020-08-08 NOTE — ED Notes (Signed)
Pt eating breakfast at this time. Pt is calm and cooperative at this time. Pt requested something else to drink with breakfast, tech provided

## 2020-08-08 NOTE — Discharge Instructions (Signed)

## 2020-08-08 NOTE — ED Provider Notes (Signed)
Emergency Medicine Observation Re-evaluation Note  Casey Hodges is a 45 y.o. female, seen on rounds today.  Pt initially presented to the ED for complaints of Suicide Attempt Currently, the patient is resting, voices no medical complaint.  Physical Exam  BP 95/63   Pulse 65   Temp 98.6 F (37 C) (Oral)   Resp 18   Ht 5\' 5"  (1.651 m)   Wt 59 kg   LMP 09/16/2015 Comment: neg preg test  SpO2 98%   BMI 21.63 kg/m  Physical Exam General: Resting in no acute distress Cardiac: No cyanosis Lungs: Equal rise and fall Psych: Not agitated  ED Course / MDM  EKG:    I have reviewed the labs performed to date as well as medications administered while in observation.  Recent changes in the last 24 hours include no events overnight.  Plan  Current plan is for psychiatric disposition. Patient is under full IVC at this time.   09/18/2015, MD 08/08/20 [phone]

## 2020-08-08 NOTE — ED Notes (Addendum)
Kerman Passey, pts mom, called to express that pt being admitted to [place] would be a hardship on her since she was ill and pt is her sole caregiver. Also, due to mom's illness, she would be unable to visit pt or pick her up at discharge. I listened to mom and told her I would pass info on to MD that was to see pt.

## 2020-08-08 NOTE — Consult Note (Signed)
[facility] Consult   Reason for Consult: Consult for 45 year old woman with a history of bipolar disorder brought to the emergency room over the weekend with reports overdose Referring Physician: Quale Patient Identification: Casey Hodges MRN:  [id number] Principal Diagnosis: Substance induced mood disorder ([place]) Diagnosis:  Principal Problem:   Substance induced mood disorder ([place]) Active Problems:   Bipolar disorder with depression ([place])   Total Time spent with patient: 1 hour  Subjective:   Casey Hodges is a 45 y.o. female patient admitted with "I really would like to go home".  HPI: Patient seen chart reviewed.  45 year old woman brought to the emergency room late at night this weekend after she had posted a comment on Facebook that implied suicidal thinking.  Family became concerned.  Patient was intoxicated with alcohol at the time.  Also had taken her prescribed Ambien and clonazepam.  On presentation to the emergency room she was belligerent and agitated but denied suicidal ideation.  Later note seem a little more confused with the patient reportedly having made some statements at one point about having taken multiple pills.  She was evaluated by [facility] and it was recommended that she remain under IVC and be admitted to inpatient psychiatry.  Patient is requesting reevaluation now that she is sober.  Patient tells me she and her boyfriend had gone out to dinner and she had consumed 2 margaritas.  She says that was the first alcohol she had had in a few years and she now regrets it greatly.  She says that she should have known she could not drink and it was a mistake to try to have alcohol.  He said that as it has in the past alcohol made her moody and weepy and over emotional.  She admits to making the Facebook post but denies having taken an overdose or done anything to try to harm herself.  She says she has been under a lot of stress recently.  She lives next door to her  chronically ill mother and is afraid that her mother may be dying soon.  Sleep has been a little bit erratic.  Appetite normal.  Denies any psychotic symptoms HI recently again denies ever having had any recent suicidal thoughts.  She says she is compliant with the medicine prescribed by her outpatient psychiatrist and sees her therapist regularly.  She is on Vraylar, Trileptal and Klonopin for a diagnosis of bipolar disorder and also still has a prescription for Ambien.  She says that in the past she has used drugs at times but recently she has been for the last 3 or more years avoiding all alcohol and drugs.  Past Psychiatric History: Past history of a diagnosis of bipolar disorder.  2 previous hospitalizations that she remembers.  1 distant 1 that involves suicidal thinking.  No recent suicidal thoughts or self injury.  Does not remember any history of psychosis.  History of past misuse of substances at times although as noted above she says that for years she has been staying sober.  Risk to Self:   Risk to Others:   Prior Inpatient Therapy:   Prior Outpatient Therapy:    Past Medical History:  Past Medical History:  Diagnosis Date  . Abnormal vaginal Pap smear    10+ years ago- no colpo repeat was normal  . Anxiety   . AR (allergic rhinitis)   . Bipolar affective ([place])    pt reported  . Bipolar disorder ([place])   .  Bursitis of both hips   . Calcium, deposits, in bursa    left hip  . Eating disorder    Under control per patient  . Fibromyalgia   . GERD (gastroesophageal reflux disease)   . Kidney stone   . Painful intercourse   . Painful menstrual periods   . Pelvic pain in female   . PTSD (post-traumatic stress disorder)   . Renal disorder   . Spinal stenosis   . Uterine polyp   . VWD (acquired von Willebrand's disease) [facility])     Past Surgical History:  Procedure Laterality Date  . ABDOMINAL HYSTERECTOMY    . CYSTOSCOPY WITH STENT PLACEMENT Left 04/19/2017   Procedure:  CYSTOSCOPY WITH STENT PLACEMENT;  Surgeon: Lucas Mallow, MD;  Location: [facility] ORS;  Service: Urology;  Laterality: Left;  . HYSTEROSCOPY     removed polyps  . LAPAROSCOPIC VAGINAL HYSTERECTOMY  2015   at [facility]  . LAPAROSCOPY Left 01/10/2015   Procedure: LAPAROSCOPY OPERATIVE with biopsy, left oopherectomy;  Surgeon: Brayton Mars, MD;  Location: [facility] ORS;  Service: Gynecology;  Laterality: Left;  . LAPAROSCOPY ABDOMEN DIAGNOSTIC    . OOPHORECTOMY Left   . TUBAL LIGATION    . URETEROSCOPY WITH HOLMIUM LASER LITHOTRIPSY Left 04/19/2017   Procedure: URETEROSCOPY WITH HOLMIUM LASER LITHOTRIPSY;  Surgeon: Lucas Mallow, MD;  Location: [facility] ORS;  Service: Urology;  Laterality: Left;   Family History:  Family History  Problem Relation Age of Onset  . Hypertension Father   . Sleep apnea Mother   . Breast cancer Paternal Grandmother   . Diabetes Maternal Grandmother   . Breast cancer Maternal Grandmother   . Diabetes Maternal Aunt   . Diabetes Maternal Uncle    Family Psychiatric  History: Does not know of any Social History:  Social History   Substance and Sexual Activity  Alcohol Use No  . Alcohol/week: 0.0 standard drinks   Comment: Socially- seldom     Social History   Substance and Sexual Activity  Drug Use No    Social History   Socioeconomic History  . Marital status: Widowed    Spouse name: Not on file  . Number of children: 2  . Years of education: Not on file  . Highest education level: GED or equivalent  Occupational History  . Occupation: Un-employed due to bipolar  Tobacco Use  . Smoking status: Current Every Day Smoker    Packs/day: 1.00    Types: Cigarettes    Start date: 05/18/1995  . Smokeless tobacco: Never Used  Vaping Use  . Vaping Use: Never used  Substance and Sexual Activity  . Alcohol use: No    Alcohol/week: 0.0 standard drinks    Comment: Socially- seldom  . Drug use: No  . Sexual activity: Yes    Partners: Male     Birth control/protection: Surgical  Other Topics Concern  . Not on file  Social History Narrative   Regular Exercise -  NO   Daily Caffeine Use:  1 cup coffee in am      1 child, one step child      4 years ago she watched her husband die slowly before her - resulting in her having PTSD      Social Determinants of Health   Financial Resource Strain: Medium Risk  . Difficulty of Paying Living Expenses: Somewhat hard  Food Insecurity: Food Insecurity Present  . Worried About Charity fundraiser in the Last Year:  Sometimes true  . Ran Out of Food in the Last Year: Never true  Transportation Needs: No Transportation Needs  . Lack of Transportation (Medical): No  . Lack of Transportation (Non-Medical): No  Physical Activity: Sufficiently Active  . Days of Exercise per Week: 5 days  . Minutes of Exercise per Session: 60 min  Stress: Stress Concern Present  . Feeling of Stress : To some extent  Social Connections: Moderately Isolated  . Frequency of Communication with Friends and Family: More than three times a week  . Frequency of Social Gatherings with Friends and Family: More than three times a week  . Attends Religious Services: More than 4 times per year  . Active Member of Clubs or Organizations: No  . Attends Archivist Meetings: Never  . Marital Status: Widowed   Additional Social History:    Allergies:   Allergies  Allergen Reactions  . Wellbutrin [Bupropion Hcl] Other (See Comments)    Reaction:  Suicidal   . Lasix [Furosemide] Rash    Labs:  Results for orders placed or performed during the hospital encounter of 08/06/20 (from the past 48 hour(s))  CBC with Differential     Status: None   Collection Time: 08/07/20 12:20 AM  Result Value Ref Range   WBC 7.7 4.0 - 10.5 K/uL   RBC 4.18 3.87 - 5.11 MIL/uL   Hemoglobin 12.8 12.0 - 15.0 g/dL   HCT 38.3 36.0 - 46.0 %   MCV 91.6 80.0 - 100.0 fL   MCH 30.6 26.0 - 34.0 pg   MCHC 33.4 30.0 - 36.0 g/dL    RDW 12.0 11.5 - 15.5 %   Platelets 249 150 - 400 K/uL   nRBC 0.0 0.0 - 0.2 %   Neutrophils Relative % 43 %   Neutro Abs 3.3 1.7 - 7.7 K/uL   Lymphocytes Relative 49 %   Lymphs Abs 3.7 0.7 - 4.0 K/uL   Monocytes Relative 7 %   Monocytes Absolute 0.5 0.1 - 1.0 K/uL   Eosinophils Relative 1 %   Eosinophils Absolute 0.1 0.0 - 0.5 K/uL   Basophils Relative 0 %   Basophils Absolute 0.0 0.0 - 0.1 K/uL   Immature Granulocytes 0 %   Abs Immature Granulocytes 0.01 0.00 - 0.07 K/uL    Comment: Performed at [facility], [address] [place], [place] [address]  Ethanol     Status: Abnormal   Collection Time: 08/07/20 12:20 AM  Result Value Ref Range   Alcohol, Ethyl (B) 88 (H) <10 mg/dL    Comment: (NOTE) Lowest detectable limit for serum alcohol is 10 mg/dL.  For medical purposes only. Performed at [facility], [address] [place], [place] [address]   hCG, quantitative, pregnancy     Status: None   Collection Time: 08/07/20 12:20 AM  Result Value Ref Range   hCG, Beta Chain, Quant, S <1 <5 mIU/mL    Comment:          GEST. AGE      CONC.  (mIU/mL)   <=1 WEEK        5 - 50     2 WEEKS       50 - 500     3 WEEKS       100 - 10,000     4 WEEKS     1,000 - 30,000     5 WEEKS     3,500 - 115,000   6-8 WEEKS  12,000 - 270,000    12 WEEKS     15,000 - 220,000        FEMALE AND NON-PREGNANT FEMALE:     LESS THAN 5 mIU/mL Performed at [facility], [place], [place] [address]   Salicylate level     Status: Abnormal   Collection Time: 08/07/20 12:20 AM  Result Value Ref Range   Salicylate Lvl <6.4 (L) 7.0 - 30.0 mg/dL    Comment: Performed at [facility], [address] [place], [place] [address]  Acetaminophen level     Status: Abnormal   Collection Time: 08/07/20 12:20 AM  Result Value Ref Range   Acetaminophen (Tylenol), Serum <10 (L) 10 - 30 ug/mL    Comment: (NOTE) Therapeutic concentrations vary significantly. A  range of 10-30 ug/mL  may be an effective concentration for many patients. However, some  are best treated at concentrations outside of this range. Acetaminophen concentrations >150 ug/mL at 4 hours after ingestion  and >50 ug/mL at 12 hours after ingestion are often associated with  toxic reactions.  Performed at [facility], [address] [place], [place] [address]   Resp Panel by RT-PCR (Flu A&B, Covid) Nasopharyngeal Swab     Status: Abnormal   Collection Time: 08/07/20  4:42 AM   Specimen: Nasopharyngeal Swab; Nasopharyngeal(NP) swabs in vial transport medium  Result Value Ref Range   SARS Coronavirus 2 by RT PCR POSITIVE (A) NEGATIVE    Comment: RESULT CALLED TO, READ BACK BY AND VERIFIED WITH: D.TULLOCH,RN [id number] 08/07/20 GM (NOTE) SARS-CoV-2 target nucleic acids are DETECTED.  The SARS-CoV-2 RNA is generally detectable in upper respiratory specimens during the acute phase of infection. Positive results are indicative of the presence of the identified virus, but do not rule out bacterial infection or co-infection with other pathogens not detected by the test. Clinical correlation with patient history and other diagnostic information is necessary to determine patient infection status. The expected result is Negative.  Fact Sheet for Patients: EntrepreneurPulse.com.au  Fact Sheet for Healthcare Providers: IncredibleEmployment.be  This test is not yet approved or cleared by the [place] FDA and  has been authorized for detection and/or diagnosis of SARS-CoV-2 by FDA under an Emergency Use Authorization (EUA).  This EUA will remain in effect (meaning this test can be used ) for the duration of  the COVID-19 declaration under Section 564(b)(1) of the Act, 21 U.S.C. section 360bbb-3(b)(1), unless the authorization is terminated or revoked sooner.     Influenza A by PCR NEGATIVE NEGATIVE   Influenza B by PCR NEGATIVE  NEGATIVE    Comment: (NOTE) The Xpert Xpress SARS-CoV-2/FLU/RSV plus assay is intended as an aid in the diagnosis of influenza from Nasopharyngeal swab specimens and should not be used as a sole basis for treatment. Nasal washings and aspirates are unacceptable for Xpert Xpress SARS-CoV-2/FLU/RSV testing.  Fact Sheet for Patients: EntrepreneurPulse.com.au  Fact Sheet for Healthcare Providers: IncredibleEmployment.be  This test is not yet approved or cleared by the [place] FDA and has been authorized for detection and/or diagnosis of SARS-CoV-2 by FDA under an Emergency Use Authorization (EUA). This EUA will remain in effect (meaning this test can be used) for the duration of the COVID-19 declaration under Section 564(b)(1) of the Act, 21 U.S.C. section 360bbb-3(b)(1), unless the authorization is terminated or revoked.  Performed at [facility], [address] [place],  [address]   POC SARS Coronavirus 2 Ag-ED - Nasal Swab (BD Veritor  Kit)     Status: None   Collection Time: 08/07/20  8:22 PM  Result Value Ref Range   SARS Coronavirus 2 Ag NEGATIVE NEGATIVE    Comment: (NOTE) SARS-CoV-2 antigen NOT DETECTED.   Negative results are presumptive.  Negative results do not preclude SARS-CoV-2 infection and should not be used as the sole basis for treatment or other patient management decisions, including infection  control decisions, particularly in the presence of clinical signs and  symptoms consistent with COVID-19, or in those who have been in contact with the virus.  Negative results must be combined with clinical observations, patient history, and epidemiological information. The expected result is Negative.  Fact Sheet for Patients: HandmadeRecipes.com.cy  Fact Sheet for Healthcare Providers: FuneralLife.at  This test is not yet approved or cleared by the [place] FDA and  has been authorized for detection and/or diagnosis of SARS-CoV-2 by FDA under an Emergency Use Authorization (EUA).  This EUA will remain in effect (meaning this test can be used) for the duration of  the COV ID-19 declaration under Section 564(b)(1) of the Act, 21 U.S.C. section 360bbb-3(b)(1), unless the authorization is terminated or revoked sooner.      Current Facility-Administered Medications  Medication Dose Route Frequency Provider Last Rate Last Admin  . nicotine (NICODERM CQ - dosed in mg/24 hours) patch 14 mg  14 mg Transdermal Once Delman Kitten, MD       Current Outpatient Medications  Medication Sig Dispense Refill  . amphetamine-dextroamphetamine (ADDERALL XR) 10 MG 24 hr capsule Take 10 mg by mouth daily.    . clonazePAM (KLONOPIN) 1 MG tablet Take 1 mg by mouth 2 (two) times daily.  1  . valACYclovir (VALTREX) 500 MG tablet TAKE 1 TABLET BY MOUTH EVERY DAY (Patient taking differently: Take 500 mg by mouth daily.) 90 tablet 2  . zolpidem (AMBIEN) 10 MG tablet Take 10 mg by mouth at bedtime.    Marland Kitchen amphetamine-dextroamphetamine (ADDERALL XR) 5 MG 24 hr capsule dextroamphetamine-amphetamine ER 5 mg 24hr capsule,extend release    . fluticasone (FLONASE) 50 MCG/ACT nasal spray Place 2 sprays into both nostrils daily. Can use twice daily for first 3 days. 16 g 3  . lidocaine (LIDODERM) 5 % Lidoderm 5 % topical patch  APPLY 1 PATCH TO SKIN ONCE DAILY **MAY WEAR UP TO 12 HOURS**    . oxcarbazepine (TRILEPTAL) 600 MG tablet Take 600 mg by mouth 2 (two) times daily. (Patient not taking: No sig reported)    . VRAYLAR capsule Take 3 mg by mouth daily. (Patient not taking: No sig reported)      Musculoskeletal: Strength & Muscle Tone: within normal limits Gait & Station: normal Patient leans: N/A  Psychiatric Specialty Exam: Physical Exam Vitals and nursing note reviewed.  Constitutional:      Appearance: She is well-developed and well-nourished.  HENT:      Head: Normocephalic and atraumatic.  Eyes:     Conjunctiva/sclera: Conjunctivae normal.     Pupils: Pupils are equal, round, and reactive to light.  Cardiovascular:     Heart sounds: Normal heart sounds.  Pulmonary:     Effort: Pulmonary effort is normal.  Abdominal:     Palpations: Abdomen is soft.  Musculoskeletal:        General: Normal range of motion.     Cervical back: Normal range of motion.  Skin:    General: Skin is warm and dry.  Neurological:     General: No focal deficit present.  Mental Status: She is alert.  Psychiatric:        Mood and Affect: Mood normal.        Thought Content: Thought content normal.     Review of Systems  Constitutional: Negative.   HENT: Negative.   Eyes: Negative.   Respiratory: Negative.   Cardiovascular: Negative.   Gastrointestinal: Negative.   Musculoskeletal: Negative.   Skin: Negative.   Neurological: Negative.   Psychiatric/Behavioral: Negative.     Blood pressure 95/63, pulse 65, temperature 98.6 F (37 C), temperature source Oral, resp. rate 18, height 5' 5"  (1.651 m), weight 59 kg, last menstrual period 09/16/2015, SpO2 98 %.Body mass index is 21.63 kg/m.  General Appearance: Casual  Eye Contact:  Good  Speech:  Clear and Coherent  Volume:  Normal  Mood:  Euthymic  Affect:  Congruent  Thought Process:  Coherent  Orientation:  Full (Time, Place, and Person)  Thought Content:  Logical  Suicidal Thoughts:  No  Homicidal Thoughts:  No  Memory:  Immediate;   Fair Recent;   Fair Remote;   Fair  Judgement:  Fair  Insight:  Fair  Psychomotor Activity:  Normal  Concentration:  Concentration: Fair  Recall:  [employer] of Knowledge:  Fair  Language:  Fair  Akathisia:  No  Handed:  Right  AIMS (if indicated):     Assets:  Communication Skills Desire for Improvement Physical Health Resilience Social Support  ADL's:  Intact  Cognition:  WNL  Sleep:        Treatment Plan Summary: Plan On reevaluation the  patient shows no signs of intoxication.  Blood alcohol level on initial presentation was 80.  Patient is calm and appropriate.  Very appropriate with controlled affect and conversation.  Patient shows good insight and takes responsibility for her recent behaviors.  She states an appropriate plan to continue outpatient medication and follow-up with her primary psychiatrist and psychologist.  Patient absolutely states she intends to stay away from alcohol completely again.  At this point she appears to be stable and at her baseline and does not meet commitment criteria.  Patient is strongly requesting to be allowed to go home.  There does not appear to be enough criteria as I said for her to meet commitment criteria when she is requesting discharge.  Discontinue IVC.  Reviewed with patient and agree with her plans to not drink to stay on her medicine and to follow-up with her providers.  Patient reminded that if suicidal thoughts were to arise she has multiple people in her life she is close with she can talk to immediately.  She agrees to all of this.  Case reviewed with TTS and emergency room physician.  She can be discharged from the emergency room.  No new prescriptions.  Disposition: No evidence of imminent risk to self or others at present.   Patient does not meet criteria for psychiatric inpatient admission. Supportive therapy provided about ongoing stressors. Discussed crisis plan, support from social network, calling 911, coming to the Emergency Department, and calling Suicide Hotline.  Alethia Berthold, MD 08/08/2020 10:59 AM

## 2020-08-08 NOTE — ED Notes (Signed)
Patient sleeping at this time, vital signs deferred. 

## 2020-08-08 NOTE — ED Notes (Signed)
Per pt she was covid+ by a home test 2nd week of January. Per edp pt does not need covid isolation at this time.

## 2020-08-08 NOTE — ED Notes (Signed)
IVC/pending re-eval in AM.

## 2020-08-08 NOTE — ED Provider Notes (Signed)
"  Patient is Covid +, asymptomatic. Reports that she had covid and symptoms 2 weeks ago but symptoms are now gone."  Patient is asymptomatic, she can be medically cleared from Covid isolation at this time.  Antigen test negative symptoms 2 weeks ago but have since resolved.   Sharyn Creamer, MD 08/08/20 1004

## 2020-08-29 ENCOUNTER — Telehealth: Payer: Self-pay | Admitting: Family Medicine

## 2020-08-29 NOTE — Telephone Encounter (Signed)
Copied from CRM [id number]. Topic: Medicare AWV >> Aug 29, 2020 10:39 AM Claudette Laws R wrote: Reason for CRM:   Left message for patient to call back and schedule Medicare Annual Wellness Visit (AWV) in office.   If unable to come into the office for AWV,  please offer to do virtually or by telephone.  Last AWV: 09/03/2019  Please schedule at anytime with [facility] Health Advisor.  40 minute appointment  Any questions, please contact me at [phone]

## 2020-09-15 ENCOUNTER — Telehealth: Payer: Self-pay | Admitting: Certified Nurse Midwife

## 2020-09-15 NOTE — Telephone Encounter (Signed)
Casey Hodges called in and states she received a letter form [facility] stating that she was due for her annual mammogram.  When she called [facility] to schedule a mammogram, they told her they couldn't schedule one without another order from [facility].  Margherita's last mammogram was 08/21/2019.  Casey Hodges wants to know if she is even due for a mammogram and if so, can we put an order in for her to get one?  Also, Casey Hodges states if someone calls her cell phone to please leave a voicemail because her cell phone doesn't get great reception.  I did inform patient that Pattricia Boss was out of the office until Monday and she was ok with waiting until then.  Please advise.

## 2020-09-16 ENCOUNTER — Other Ambulatory Visit: Payer: Self-pay

## 2020-09-16 DIAGNOSIS — Z1231 Encounter for screening mammogram for malignant neoplasm of breast: Secondary | ICD-10-CM

## 2020-09-16 DIAGNOSIS — N6310 Unspecified lump in the right breast, unspecified quadrant: Secondary | ICD-10-CM

## 2020-09-16 NOTE — Telephone Encounter (Signed)
Mammo and rt breast u/s ordered.   AE made or 4/4 at 3pm.

## 2020-09-26 ENCOUNTER — Other Ambulatory Visit: Payer: Self-pay

## 2020-09-26 ENCOUNTER — Ambulatory Visit (INDEPENDENT_AMBULATORY_CARE_PROVIDER_SITE_OTHER): Payer: Medicare Other | Admitting: Certified Nurse Midwife

## 2020-09-26 ENCOUNTER — Encounter: Payer: Self-pay | Admitting: Certified Nurse Midwife

## 2020-09-26 VITALS — BP 111/73 | HR 94 | Ht 65.0 in | Wt 134.3 lb

## 2020-09-26 DIAGNOSIS — Z01419 Encounter for gynecological examination (general) (routine) without abnormal findings: Secondary | ICD-10-CM

## 2020-09-26 DIAGNOSIS — Z13220 Encounter for screening for lipoid disorders: Secondary | ICD-10-CM

## 2020-09-26 DIAGNOSIS — R232 Flushing: Secondary | ICD-10-CM | POA: Diagnosis not present

## 2020-09-26 DIAGNOSIS — Z83438 Family history of other disorder of lipoprotein metabolism and other lipidemia: Secondary | ICD-10-CM

## 2020-09-26 DIAGNOSIS — Z1159 Encounter for screening for other viral diseases: Secondary | ICD-10-CM

## 2020-09-26 DIAGNOSIS — Z8639 Personal history of other endocrine, nutritional and metabolic disease: Secondary | ICD-10-CM

## 2020-09-26 DIAGNOSIS — Z1231 Encounter for screening mammogram for malignant neoplasm of breast: Secondary | ICD-10-CM

## 2020-09-26 DIAGNOSIS — E663 Overweight: Secondary | ICD-10-CM

## 2020-09-26 NOTE — Progress Notes (Signed)
Annual Exam-Pt stated having breast pains and order for mammogram was placed last month pt is aware and encouraged to schedule her mammogram as soon as possible.

## 2020-09-26 NOTE — Progress Notes (Signed)
GYNECOLOGY ANNUAL PREVENTATIVE CARE ENCOUNTER NOTE  History:     Casey Hodges is a 45 y.o. G67P1001 female here for a routine annual gynecologic exam.  Current complaints: hot flashes at night .   Denies abnormal vaginal bleeding, discharge, pelvic pain, problems with intercourse or other gynecologic concerns.     Social Relationship: in relationship female partner x 2 yrs Living: with boyfriend  Work: disabled due to chemical imbalance Exercise: not currently  Smoke/Alcohol/drug use: denies   Gynecologic History Patient's last menstrual period was 09/16/2015. Contraception: status post hysterectomy Last Pap: 2012 Results were: normal per pt  Last mammogram: 2021. Results were: normal  Obstetric History OB History  Gravida Para Term Preterm AB Living  1 1 1     1   SAB IAB Ectopic Multiple Live Births          1    # Outcome Date GA Lbr Len/2nd Weight Sex Delivery Anes PTL Lv  1 Term 1999   7 lb 9 oz (3.43 kg) F Vag-Spont   LIV    Past Medical History:  Diagnosis Date  . Abnormal vaginal Pap smear    10+ years ago- no colpo repeat was normal  . ADHD   . Anxiety   . AR (allergic rhinitis)   . Bipolar affective (HCC)    pt reported  . Bipolar disorder (HCC)   . Bursitis of both hips   . Calcium, deposits, in bursa    left hip  . Eating disorder    Under control per patient  . Fibromyalgia   . GERD (gastroesophageal reflux disease)   . Kidney stone   . Painful intercourse   . Painful menstrual periods   . Pelvic pain in female   . PTSD (post-traumatic stress disorder)   . Renal disorder   . Spinal stenosis   . Uterine polyp   . VWD (acquired von Willebrand's disease) [facility])     Past Surgical History:  Procedure Laterality Date  . ABDOMINAL HYSTERECTOMY    . CYSTOSCOPY WITH STENT PLACEMENT Left 04/19/2017   Procedure: CYSTOSCOPY WITH STENT PLACEMENT;  Surgeon: 04/21/2017, MD;  Location: [facility] ORS;  Service: Urology;  Laterality: Left;  .  HYSTEROSCOPY     removed polyps  . LAPAROSCOPIC VAGINAL HYSTERECTOMY  2015   at [facility]  . LAPAROSCOPY Left 01/10/2015   Procedure: LAPAROSCOPY OPERATIVE with biopsy, left oopherectomy;  Surgeon: 01/12/2015, MD;  Location: [facility] ORS;  Service: Gynecology;  Laterality: Left;  . LAPAROSCOPY ABDOMEN DIAGNOSTIC    . OOPHORECTOMY Left   . TUBAL LIGATION    . URETEROSCOPY WITH HOLMIUM LASER LITHOTRIPSY Left 04/19/2017   Procedure: URETEROSCOPY WITH HOLMIUM LASER LITHOTRIPSY;  Surgeon: 04/21/2017, MD;  Location: [facility] ORS;  Service: Urology;  Laterality: Left;    Current Outpatient Medications on File Prior to Visit  Medication Sig Dispense Refill  . amphetamine-dextroamphetamine (ADDERALL XR) 10 MG 24 hr capsule Take 10 mg by mouth daily.    . diazepam (VALIUM) 5 MG tablet Take 5 mg by mouth every 6 (six) hours as needed for anxiety.    . fluticasone (FLONASE) 50 MCG/ACT nasal spray Place 2 sprays into both nostrils daily. Can use twice daily for first 3 days. 16 g 3  . lidocaine (LIDODERM) 5 % Lidoderm 5 % topical patch  APPLY 1 PATCH TO SKIN ONCE DAILY **MAY WEAR UP TO 12 HOURS**    . oxcarbazepine (TRILEPTAL) 600 MG  tablet Take 600 mg by mouth 2 (two) times daily.    . valACYclovir (VALTREX) 500 MG tablet TAKE 1 TABLET BY MOUTH EVERY DAY (Patient taking differently: Take 500 mg by mouth daily.) 90 tablet 2  . VRAYLAR capsule Take 3 mg by mouth daily.    Marland Kitchen zolpidem (AMBIEN) 10 MG tablet Take 10 mg by mouth at bedtime.     No current facility-administered medications on file prior to visit.    Allergies  Allergen Reactions  . Wellbutrin [Bupropion Hcl] Other (See Comments)    Reaction:  Suicidal   . Lasix [Furosemide] Rash    Social History:  reports that she has been smoking cigarettes. She started smoking about 25 years ago. She has been smoking about 1.00 pack per day. She has never used smokeless tobacco. She reports current drug use. Drug: Marijuana. She reports  that she does not drink alcohol.  Family History  Problem Relation Age of Onset  . Hypertension Father   . Kidney Stones Father   . Sleep apnea Mother   . Hypertension Mother   . Kidney Stones Mother   . Lymphoma Mother   . Breast cancer Paternal Grandmother   . Diabetes Maternal Grandmother   . Breast cancer Maternal Grandmother   . Diabetes Maternal Aunt   . Diabetes Maternal Uncle     The following portions of the patient's history were reviewed and updated as appropriate: allergies, current medications, past family history, past medical history, past social history, past surgical history and problem list.  Review of Systems Pertinent items noted in HPI and remainder of comprehensive ROS otherwise negative.  Physical Exam:  BP 111/73   Pulse 94   Ht 5\' 5"  (1.651 m)   Wt 134 lb 4.8 oz (60.9 kg)   LMP 09/16/2015 Comment: neg preg test  BMI 22.35 kg/m  CONSTITUTIONAL: Well-developed, well-nourished female in no acute distress.  HENT:  Normocephalic, atraumatic, External right and left ear normal. Oropharynx is clear and moist EYES: Conjunctivae and EOM are normal. Pupils are equal, round, and reactive to light. No scleral icterus.  NECK: Normal range of motion, supple, no masses.  Normal thyroid.  SKIN: Skin is warm and dry. No rash noted. Not diaphoretic. No erythema. No pallor. MUSCULOSKELETAL: Normal range of motion. No tenderness.  No cyanosis, clubbing, or edema.  2+ distal pulses. NEUROLOGIC: Alert and oriented to person, place, and time. Normal reflexes, muscle tone coordination.  PSYCHIATRIC: Normal mood and affect. Normal behavior. Normal judgment and thought content. CARDIOVASCULAR: Normal heart rate noted, regular rhythm RESPIRATORY: Clear to auscultation bilaterally. Effort and breath sounds normal, no problems with respiration noted. BREASTS: Symmetric in size. No masses, tenderness, skin changes, nipple drainage, or lymphadenopathy bilaterally.  ABDOMEN: Soft,  no distention noted.  No tenderness, rebound or guarding.  PELVIC: Normal appearing external genitalia and urethral meatus; normal appearing vaginal mucosa .  No abnormal discharge noted.  Pap smear not collected- no cervix. Uterus- absent, no other palpable masses, adnexal tenderness.  .   Assessment and Plan:    1. Encounter for well woman exam with routine gynecological exam   Pap: n/a  Mammogram : ordered  Labs: hep c, FSH Refills: none  Referral: none Routine preventative health maintenance measures emphasized. Please refer to After Visit Summary for other counseling recommendations.      09/18/2015, CNM [facility],  [facility]

## 2020-09-26 NOTE — Patient Instructions (Signed)
Preventive Care 84-44 Years Old, Female Preventive care refers to lifestyle choices and visits with your health care provider that can promote health and wellness. This includes:  A yearly physical exam. This is also called an annual wellness visit.  Regular dental and eye exams.  Immunizations.  Screening for certain conditions.  Healthy lifestyle choices, such as: ? Eating a healthy diet. ? Getting regular exercise. ? Not using drugs or products that contain nicotine and tobacco. ? Limiting alcohol use. What can I expect for my preventive care visit? Physical exam Your health care provider will check your:  Height and weight. These may be used to calculate your BMI (body mass index). BMI is a measurement that tells if you are at a healthy weight.  Heart rate and blood pressure.  Body temperature.  Skin for abnormal spots. Counseling Your health care provider may ask you questions about your:  Past medical problems.  Family's medical history.  Alcohol, tobacco, and drug use.  Emotional well-being.  Home life and relationship well-being.  Sexual activity.  Diet, exercise, and sleep habits.  Work and work Statistician.  Access to firearms.  Method of birth control.  Menstrual cycle.  Pregnancy history. What immunizations do I need? Vaccines are usually given at various ages, according to a schedule. Your health care provider will recommend vaccines for you based on your age, medical history, and lifestyle or other factors, such as travel or where you work.   What tests do I need? Blood tests  Lipid and cholesterol levels. These may be checked every 5 years, or more often if you are over 3 years old.  Hepatitis C test.  Hepatitis B test. Screening  Lung cancer screening. You may have this screening every year starting at age 73 if you have a 30-pack-year history of smoking and currently smoke or have quit within the past 15 years.  Colorectal cancer  screening. ? All adults should have this screening starting at age 52 and continuing until age 17. ? Your health care provider may recommend screening at age 49 if you are at increased risk. ? You will have tests every 1-10 years, depending on your results and the type of screening test.  Diabetes screening. ? This is done by checking your blood sugar (glucose) after you have not eaten for a while (fasting). ? You may have this done every 1-3 years.  Mammogram. ? This may be done every 1-2 years. ? Talk with your health care provider about when you should start having regular mammograms. This may depend on whether you have a family history of breast cancer.  BRCA-related cancer screening. This may be done if you have a family history of breast, ovarian, tubal, or peritoneal cancers.  Pelvic exam and Pap test. ? This may be done every 3 years starting at age 10. ? Starting at age 11, this may be done every 5 years if you have a Pap test in combination with an HPV test. Other tests  STD (sexually transmitted disease) testing, if you are at risk.  Bone density scan. This is done to screen for osteoporosis. You may have this scan if you are at high risk for osteoporosis. Talk with your health care provider about your test results, treatment options, and if necessary, the need for more tests. Follow these instructions at home: Eating and drinking  Eat a diet that includes fresh fruits and vegetables, whole grains, lean protein, and low-fat dairy products.  Take vitamin and mineral supplements  as recommended by your health care provider.  Do not drink alcohol if: ? Your health care provider tells you not to drink. ? You are pregnant, may be pregnant, or are planning to become pregnant.  If you drink alcohol: ? Limit how much you have to 0-1 drink a day. ? Be aware of how much alcohol is in your drink. In the [place] one drink equals one 12 oz bottle of beer (355 mL), one 5 oz glass of  wine (148 mL), or one 1 oz glass of hard liquor (44 mL).   Lifestyle  Take daily care of your teeth and gums. Brush your teeth every morning and night with fluoride toothpaste. Floss one time each day.  Stay active. Exercise for at least 30 minutes 5 or more days each week.  Do not use any products that contain nicotine or tobacco, such as cigarettes, e-cigarettes, and chewing tobacco. If you need help quitting, ask your health care provider.  Do not use drugs.  If you are sexually active, practice safe sex. Use a condom or other form of protection to prevent STIs (sexually transmitted infections).  If you do not wish to become pregnant, use a form of birth control. If you plan to become pregnant, see your health care provider for a prepregnancy visit.  If told by your health care provider, take low-dose aspirin daily starting at age 49.  Find healthy ways to cope with stress, such as: ? Meditation, yoga, or listening to music. ? Journaling. ? Talking to a trusted person. ? Spending time with friends and family. Safety  Always wear your seat belt while driving or riding in a vehicle.  Do not drive: ? If you have been drinking alcohol. Do not ride with someone who has been drinking. ? When you are tired or distracted. ? While texting.  Wear a helmet and other protective equipment during sports activities.  If you have firearms in your house, make sure you follow all gun safety procedures. What's next?  Visit your health care provider once a year for an annual wellness visit.  Ask your health care provider how often you should have your eyes and teeth checked.  Stay up to date on all vaccines. This information is not intended to replace advice given to you by your health care provider. Make sure you discuss any questions you have with your health care provider. Document Revised: 03/15/2020 Document Reviewed: 02/20/2018 Elsevier Patient Education  2021 [place] Breast self-awareness is knowing how your breasts look and feel. Doing breast self-awareness is important. It allows you to catch a breast problem early while it is still small and can be treated. All women should do breast self-awareness, including women who have had breast implants. Tell your doctor if you notice a change in your breasts. What you need:  A mirror.  A well-lit room. How to do a breast self-exam A breast self-exam is one way to learn what is normal for your breasts and to check for changes. To do a breast self-exam: Look for changes 1. Take off all the clothes above your waist. 2. Stand in front of a mirror in a room with good lighting. 3. Put your hands on your hips. 4. Push your hands down. 5. Look at your breasts and nipples in the mirror to see if one breast or nipple looks different from the other. Check to see if: ? The shape of one breast is different. ? The size of  one breast is different. ? There are wrinkles, dips, and bumps in one breast and not the other. 6. Look at each breast for changes in the skin, such as: ? Redness. ? Scaly areas. 7. Look for changes in your nipples, such as: ? Liquid around the nipples. ? Bleeding. ? Dimpling. ? Redness. ? A change in where the nipples are.   Feel for changes 1. Lie on your back on the floor. 2. Feel each breast. To do this, follow these steps: ? Pick a breast to feel. ? Put the arm closest to that breast above your head. ? Use your other arm to feel the nipple area of your breast. Feel the area with the pads of your three middle fingers by making small circles with your fingers. For the first circle, press lightly. For the second circle, press harder. For the third circle, press even harder. ? Keep making circles with your fingers at the different pressures as you move down your breast. Stop when you feel your ribs. ? Move your fingers a little toward the center of your body. ? Start making  circles with your fingers again, this time going up until you reach your collarbone. ? Keep making up-and-down circles until you reach your armpit. Remember to keep using the three pressures. ? Feel the other breast in the same way. 3. Sit or stand in the tub or shower. 4. With soapy water on your skin, feel each breast the same way you did in step 2 when you were lying on the floor.   Write down what you find Writing down what you find can help you remember what to tell your doctor. Write down:  What is normal for each breast.  Any changes you find in each breast, including: ? The kind of changes you find. ? Whether you have pain. ? Size and location of any lumps.  When you last had your menstrual period. General tips  Check your breasts every month.  If you are breastfeeding, the best time to check your breasts is after you feed your baby or after you use a breast pump.  If you get menstrual periods, the best time to check your breasts is 5-7 days after your menstrual period is over.  With time, you will become comfortable with the self-exam, and you will begin to know if there are changes in your breasts. Contact a doctor if you:  See a change in the shape or size of your breasts or nipples.  See a change in the skin of your breast or nipples, such as red or scaly skin.  Have fluid coming from your nipples that is not normal.  Find a lump or thick area that was not there before.  Have pain in your breasts.  Have any concerns about your breast health. Summary  Breast self-awareness includes looking for changes in your breasts, as well as feeling for changes within your breasts.  Breast self-awareness should be done in front of a mirror in a well-lit room.  You should check your breasts every month. If you get menstrual periods, the best time to check your breasts is 5-7 days after your menstrual period is over.  Let your doctor know of any changes you see in your  breasts, including changes in size, changes on the skin, pain or tenderness, or fluid from your nipples that is not normal. This information is not intended to replace advice given to you by your health care provider. Make sure  you discuss any questions you have with your health care provider. Document Revised: 01/28/2018 Document Reviewed: 01/28/2018 Elsevier Patient Education  [place].

## 2020-09-27 ENCOUNTER — Other Ambulatory Visit: Payer: Medicare Other

## 2020-09-29 ENCOUNTER — Other Ambulatory Visit: Payer: Medicare Other

## 2020-09-30 ENCOUNTER — Other Ambulatory Visit: Payer: Self-pay

## 2020-09-30 ENCOUNTER — Other Ambulatory Visit: Payer: Medicare Other

## 2020-10-01 LAB — HEPATITIS C ANTIBODY: Hep C Virus Ab: <0.1 s/co ratio (ref 0.0–0.9)

## 2020-10-01 LAB — FOLLICLE STIMULATING HORMONE: FSH: 36.2 m[IU]/mL

## 2020-10-03 ENCOUNTER — Telehealth: Payer: Self-pay | Admitting: Certified Nurse Midwife

## 2020-10-03 NOTE — Telephone Encounter (Signed)
Casey Hodges called in and stated she called the breast center to try to schedule her yearly mammogram and was told the order was put in wrong.  Per the patient "Diagnostic Bilateral is wrong"  Patient would like to be notified when it is ok to call back and schedule.

## 2020-10-05 ENCOUNTER — Encounter: Payer: Self-pay | Admitting: Certified Nurse Midwife

## 2020-10-06 NOTE — Telephone Encounter (Signed)
Please advise. Thanks Shunsuke Granzow 

## 2020-10-09 ENCOUNTER — Other Ambulatory Visit: Payer: Self-pay | Admitting: Certified Nurse Midwife

## 2020-10-09 DIAGNOSIS — Z1231 Encounter for screening mammogram for malignant neoplasm of breast: Secondary | ICD-10-CM

## 2020-10-11 ENCOUNTER — Other Ambulatory Visit: Payer: Self-pay

## 2020-10-11 DIAGNOSIS — Z1231 Encounter for screening mammogram for malignant neoplasm of breast: Secondary | ICD-10-CM

## 2020-10-11 DIAGNOSIS — R928 Other abnormal and inconclusive findings on diagnostic imaging of breast: Secondary | ICD-10-CM

## 2020-10-11 NOTE — Telephone Encounter (Signed)
Mammogram and ultrasound orders placed. Correct per Joellyn Haff at [place].   Pt aware per VM.

## 2020-10-13 ENCOUNTER — Ambulatory Visit (INDEPENDENT_AMBULATORY_CARE_PROVIDER_SITE_OTHER): Payer: Medicare Other

## 2020-10-13 DIAGNOSIS — Z0000 Encounter for general adult medical examination without abnormal findings: Secondary | ICD-10-CM | POA: Diagnosis not present

## 2020-10-13 NOTE — Patient Instructions (Addendum)
Casey Hodges , Thank you for taking time to come for your Medicare Wellness Visit. I appreciate your ongoing commitment to your health goals. Please review the following plan we discussed and let me know if I can assist you in the future.   Screening recommendations/referrals: Colonoscopy: due age 45 Mammogram: done 08/21/19. Please call [phone] to schedule your mammogram.  Bone Density: due age 50 Recommended yearly ophthalmology/optometry visit for glaucoma screening and checkup Recommended yearly dental visit for hygiene and checkup  Vaccinations: Influenza vaccine: declined Pneumococcal vaccine: done 09/22/15 Tdap vaccine: done 07/02/17 Shingles vaccine: due age 10  Covid-19: declined  Advanced directives: Advance directive discussed with you today. I have provided a copy for you to complete at home and have notarized. Once this is complete please bring a copy in to our office so we can scan it into your chart.  Conditions/risks identified: If you wish to quit smoking, help is available. For free tobacco cessation program offerings call the [facility] at 340-809-9614 or Live Well Line at 505-353-4169. You may also visit [email or web address] or email [email or web address] for more information on other programs.    Next appointment: Follow up in one year for your annual wellness visit.   Preventive Care 40-64 Years, Female Preventive care refers to lifestyle choices and visits with your health care provider that can promote health and wellness. What does preventive care include?  A yearly physical exam. This is also called an annual well check.  Dental exams once or twice a year.  Routine eye exams. Ask your health care provider how often you should have your eyes checked.  Personal lifestyle choices, including:  Daily care of your teeth and gums.  Regular physical activity.  Eating a healthy diet.  Avoiding tobacco and drug use.  Limiting alcohol  use.  Practicing safe sex.  Taking low-dose aspirin daily starting at age 59.  Taking vitamin and mineral supplements as recommended by your health care provider. What happens during an annual well check? The services and screenings done by your health care provider during your annual well check will depend on your age, overall health, lifestyle risk factors, and family history of disease. Counseling  Your health care provider may ask you questions about your:  Alcohol use.  Tobacco use.  Drug use.  Emotional well-being.  Home and relationship well-being.  Sexual activity.  Eating habits.  Work and work Statistician.  Method of birth control.  Menstrual cycle.  Pregnancy history. Screening  You may have the following tests or measurements:  Height, weight, and BMI.  Blood pressure.  Lipid and cholesterol levels. These may be checked every 5 years, or more frequently if you are over 45 years old.  Skin check.  Lung cancer screening. You may have this screening every year starting at age 9 if you have a 30-pack-year history of smoking and currently smoke or have quit within the past 15 years.  Fecal occult blood test (FOBT) of the stool. You may have this test every year starting at age 70.  Flexible sigmoidoscopy or colonoscopy. You may have a sigmoidoscopy every 5 years or a colonoscopy every 10 years starting at age 60.  Hepatitis C blood test.  Hepatitis B blood test.  Sexually transmitted disease (STD) testing.  Diabetes screening. This is done by checking your blood sugar (glucose) after you have not eaten for a while (fasting). You may have this done every 1-3 years.  Mammogram. This may be done every 1-2  years. Talk to your health care provider about when you should start having regular mammograms. This may depend on whether you have a family history of breast cancer.  BRCA-related cancer screening. This may be done if you have a family history of  breast, ovarian, tubal, or peritoneal cancers.  Pelvic exam and Pap test. This may be done every 3 years starting at age 81. Starting at age 31, this may be done every 5 years if you have a Pap test in combination with an HPV test.  Bone density scan. This is done to screen for osteoporosis. You may have this scan if you are at high risk for osteoporosis. Discuss your test results, treatment options, and if necessary, the need for more tests with your health care provider. Vaccines  Your health care provider may recommend certain vaccines, such as:  Influenza vaccine. This is recommended every year.  Tetanus, diphtheria, and acellular pertussis (Tdap, Td) vaccine. You may need a Td booster every 10 years.  Zoster vaccine. You may need this after age 37.  Pneumococcal 13-valent conjugate (PCV13) vaccine. You may need this if you have certain conditions and were not previously vaccinated.  Pneumococcal polysaccharide (PPSV23) vaccine. You may need one or two doses if you smoke cigarettes or if you have certain conditions. Talk to your health care provider about which screenings and vaccines you need and how often you need them. This information is not intended to replace advice given to you by your health care provider. Make sure you discuss any questions you have with your health care provider. Document Released: 07/08/2015 Document Revised: 02/29/2016 Document Reviewed: 04/12/2015 Elsevier Interactive Patient Education  2017 [place] Prevention in the Home Falls can cause injuries. They can happen to people of all ages. There are many things you can do to make your home safe and to help prevent falls. What can I do on the outside of my home?  Regularly fix the edges of walkways and driveways and fix any cracks.  Remove anything that might make you trip as you walk through a door, such as a raised step or threshold.  Trim any bushes or trees on the path to your  home.  Use bright outdoor lighting.  Clear any walking paths of anything that might make someone trip, such as rocks or tools.  Regularly check to see if handrails are loose or broken. Make sure that both sides of any steps have handrails.  Any raised decks and porches should have guardrails on the edges.  Have any leaves, snow, or ice cleared regularly.  Use sand or salt on walking paths during winter.  Clean up any spills in your garage right away. This includes oil or grease spills. What can I do in the bathroom?  Use night lights.  Install grab bars by the toilet and in the tub and shower. Do not use towel bars as grab bars.  Use non-skid mats or decals in the tub or shower.  If you need to sit down in the shower, use a plastic, non-slip stool.  Keep the floor dry. Clean up any water that spills on the floor as soon as it happens.  Remove soap buildup in the tub or shower regularly.  Attach bath mats securely with double-sided non-slip rug tape.  Do not have throw rugs and other things on the floor that can make you trip. What can I do in the bedroom?  Use night lights.  Make sure  that you have a light by your bed that is easy to reach.  Do not use any sheets or blankets that are too big for your bed. They should not hang down onto the floor.  Have a firm chair that has side arms. You can use this for support while you get dressed.  Do not have throw rugs and other things on the floor that can make you trip. What can I do in the kitchen?  Clean up any spills right away.  Avoid walking on wet floors.  Keep items that you use a lot in easy-to-reach places.  If you need to reach something above you, use a strong step stool that has a grab bar.  Keep electrical cords out of the way.  Do not use floor polish or wax that makes floors slippery. If you must use wax, use non-skid floor wax.  Do not have throw rugs and other things on the floor that can make you  trip. What can I do with my stairs?  Do not leave any items on the stairs.  Make sure that there are handrails on both sides of the stairs and use them. Fix handrails that are broken or loose. Make sure that handrails are as long as the stairways.  Check any carpeting to make sure that it is firmly attached to the stairs. Fix any carpet that is loose or worn.  Avoid having throw rugs at the top or bottom of the stairs. If you do have throw rugs, attach them to the floor with carpet tape.  Make sure that you have a light switch at the top of the stairs and the bottom of the stairs. If you do not have them, ask someone to add them for you. What else can I do to help prevent falls?  Wear shoes that:  Do not have high heels.  Have rubber bottoms.  Are comfortable and fit you well.  Are closed at the toe. Do not wear sandals.  If you use a stepladder:  Make sure that it is fully opened. Do not climb a closed stepladder.  Make sure that both sides of the stepladder are locked into place.  Ask someone to hold it for you, if possible.  Clearly mark and make sure that you can see:  Any grab bars or handrails.  First and last steps.  Where the edge of each step is.  Use tools that help you move around (mobility aids) if they are needed. These include:  Canes.  Walkers.  Scooters.  Crutches.  Turn on the lights when you go into a dark area. Replace any light bulbs as soon as they burn out.  Set up your furniture so you have a clear path. Avoid moving your furniture around.  If any of your floors are uneven, fix them.  If there are any pets around you, be aware of where they are.  Review your medicines with your doctor. Some medicines can make you feel dizzy. This can increase your chance of falling. Ask your doctor what other things that you can do to help prevent falls. This information is not intended to replace advice given to you by your health care provider. Make  sure you discuss any questions you have with your health care provider. Document Released: 04/07/2009 Document Revised: 11/17/2015 Document Reviewed: 07/16/2014 Elsevier Interactive Patient Education  2017 [employer].

## 2020-10-13 NOTE — Progress Notes (Signed)
Subjective:   Casey Hodges is a 45 y.o. female who presents for Medicare Annual (Subsequent) preventive examination.  Virtual Visit via Telephone Note  I connected with  Casey Hodges on 10/13/20 at  2:10 PM EDT by telephone and verified that I am speaking with the correct person using two identifiers.  Location: Patient: home Provider: [facility] Persons participating in the virtual visit: patient/Nurse Health Advisor   I discussed the limitations, risks, security and privacy concerns of performing an evaluation and management service by telephone and the availability of in person appointments. The patient expressed understanding and agreed to proceed.  Interactive audio and video telecommunications were attempted between this nurse and patient, however failed, due to patient having technical difficulties OR patient did not have access to video capability.  We continued and completed visit with audio only.  Some vital signs may be absent or patient reported.   Reather Littler, LPN    Review of Systems     Cardiac Risk Factors include: smoking/ tobacco exposure     Objective:    Today's Vitals   10/13/20 1429  PainSc: 5    There is no height or weight on file to calculate BMI.  Advanced Directives 10/13/2020 09/23/2019 08/29/2018 01/10/2018 07/02/2017 04/19/2017 04/19/2017  Does Patient Have a Medical Advance Directive? No No No No No No No  Would patient like information on creating a medical advance directive? Yes (MAU/Ambulatory/Procedural Areas - Information given) No - Patient declined No - Patient declined - - No - Patient declined -    Current Medications (verified) Outpatient Encounter Medications as of 10/13/2020  Medication Sig  . amphetamine-dextroamphetamine (ADDERALL XR) 10 MG 24 hr capsule Take 10 mg by mouth daily.  . Biotin 11572 MCG TABS Take by mouth.  . diazepam (VALIUM) 5 MG tablet Take 5 mg by mouth every 6 (six) hours as needed for anxiety.  . fluticasone  (FLONASE) 50 MCG/ACT nasal spray Place 2 sprays into both nostrils daily. Can use twice daily for first 3 days.  Marland Kitchen lidocaine (LIDODERM) 5 % Lidoderm 5 % topical patch  APPLY 1 PATCH TO SKIN ONCE DAILY **MAY WEAR UP TO 12 HOURS**  . valACYclovir (VALTREX) 500 MG tablet TAKE 1 TABLET BY MOUTH EVERY DAY (Patient taking differently: Take 500 mg by mouth daily.)  . VRAYLAR 4.5 MG CAPS Take 1 capsule by mouth daily.  Marland Kitchen zolpidem (AMBIEN) 10 MG tablet Take 10 mg by mouth at bedtime.  Marland Kitchen oxcarbazepine (TRILEPTAL) 600 MG tablet Take 600 mg by mouth 2 (two) times daily. (Patient not taking: Reported on 10/13/2020)  . [DISCONTINUED] VRAYLAR capsule Take 3 mg by mouth daily.   No facility-administered encounter medications on file as of 10/13/2020.    Allergies (verified) Wellbutrin [bupropion hcl] and Lasix [furosemide]   History: Past Medical History:  Diagnosis Date  . Abnormal vaginal Pap smear    10+ years ago- no colpo repeat was normal  . ADHD   . Anxiety   . AR (allergic rhinitis)   . Bipolar affective (HCC)    pt reported  . Bipolar disorder (HCC)   . Bursitis of both hips   . Calcium, deposits, in bursa    left hip  . Eating disorder    Under control per patient  . Fibromyalgia   . GERD (gastroesophageal reflux disease)   . Kidney stone   . Painful intercourse   . Painful menstrual periods   . Pelvic pain in female   . PTSD (  post-traumatic stress disorder)   . Renal disorder   . Spinal stenosis   . Uterine polyp   . VWD (acquired von Willebrand's disease) [facility](HCC)    Past Surgical History:  Procedure Laterality Date  . ABDOMINAL HYSTERECTOMY    . CYSTOSCOPY WITH STENT PLACEMENT Left 04/19/2017   Procedure: CYSTOSCOPY WITH STENT PLACEMENT;  Surgeon: Crista ElliotBell, Eugene D III, MD;  Location: [facility] ORS;  Service: Urology;  Laterality: Left;  . HYSTEROSCOPY     removed polyps  . LAPAROSCOPIC VAGINAL HYSTERECTOMY  [facility]   [facility]  . LAPAROSCOPY Left 01/10/2015   Procedure:  LAPAROSCOPY OPERATIVE with biopsy, left oopherectomy;  Surgeon: Herold HarmsMartin A Defrancesco, MD;  Location: [facility] ORS;  Service: Gynecology;  Laterality: Left;  . LAPAROSCOPY ABDOMEN DIAGNOSTIC    . OOPHORECTOMY Left   . TUBAL LIGATION    . URETEROSCOPY WITH HOLMIUM LASER LITHOTRIPSY Left 04/19/2017   Procedure: URETEROSCOPY WITH HOLMIUM LASER LITHOTRIPSY;  Surgeon: Crista ElliotBell, Eugene D III, MD;  Location: [facility] ORS;  Service: Urology;  Laterality: Left;   Family History  Problem Relation Age of Onset  . Hypertension Father   . Kidney Stones Father   . Sleep apnea Mother   . Hypertension Mother   . Kidney Stones Mother   . Lymphoma Mother   . Breast cancer Paternal Grandmother   . Diabetes Maternal Grandmother   . Breast cancer Maternal Grandmother   . Diabetes Maternal Aunt   . Diabetes Maternal Uncle    Social History   Socioeconomic History  . Marital status: Widowed    Spouse name: Not on file  . Number of children: 2  . Years of education: Not on file  . Highest education level: GED or equivalent  Occupational History  . Occupation: Un-employed due to bipolar  Tobacco Use  . Smoking status: Current Every Day Smoker    Packs/day: 1.00    Types: Cigarettes    Start date: 05/18/1995  . Smokeless tobacco: Never Used  . Tobacco comment: smoking cessation information provided in AVS  Vaping Use  . Vaping Use: Some days  . Substances: Nicotine  Substance and Sexual Activity  . Alcohol use: No    Alcohol/week: 0.0 standard drinks    Comment: Socially- seldom  . Drug use: Yes    Types: Marijuana    Comment: daily  . Sexual activity: Yes    Partners: Male    Birth control/protection: Surgical  Other Topics Concern  . Not on file  Social History Narrative   Regular Exercise -  NO   Daily Caffeine Use:  1 cup coffee in am      1 child, one step child      4 years ago she watched her husband die slowly before her - resulting in her having PTSD      Social Determinants of Health    Financial Resource Strain: Low Risk   . Difficulty of Paying Living Expenses: Not very hard  Food Insecurity: No Food Insecurity  . Worried About Programme researcher, broadcasting/film/videounning Out of Food in the Last Year: Never true  . Ran Out of Food in the Last Year: Never true  Transportation Needs: No Transportation Needs  . Lack of Transportation (Medical): No  . Lack of Transportation (Non-Medical): No  Physical Activity: Inactive  . Days of Exercise per Week: 0 days  . Minutes of Exercise per Session: 0 min  Stress: Stress Concern Present  . Feeling of Stress : To some extent  Social Connections: Moderately  Isolated  . Frequency of Communication with Friends and Family: More than three times a week  . Frequency of Social Gatherings with Friends and Family: More than three times a week  . Attends Religious Services: More than 4 times per year  . Active Member of Clubs or Organizations: No  . Attends Banker Meetings: Never  . Marital Status: Widowed    Tobacco Counseling Ready to quit: Yes Counseling given: Yes Comment: smoking cessation information provided in AVS   Clinical Intake:  Pre-visit preparation completed: Yes  Pain : 0-10 Pain Score: 5  Pain Type: Chronic pain Pain Location: Back (neck) Pain Orientation: Posterior Pain Descriptors / Indicators: Aching,Sore Pain Onset: More than a month ago Pain Frequency: Constant     Nutritional Risks: None Diabetes: No  How often do you need to have someone help you when you read instructions, pamphlets, or other written materials from your doctor or pharmacy?: 1 - Never    Interpreter Needed?: No  Information entered by :: Reather Littler LPN   Activities of Daily Living In your present state of health, do you have any difficulty performing the following activities: 10/13/2020 06/29/2020  Hearing? N Y  Comment declines hearing aids -  Vision? N N  Difficulty concentrating or making decisions? N Y  Walking or climbing stairs? N N   Dressing or bathing? N N  Doing errands, shopping? N N  Preparing Food and eating ? N -  Using the Toilet? N -  In the past six months, have you accidently leaked urine? N -  Do you have problems with loss of bowel control? N -  Managing your Medications? N -  Managing your Finances? N -  Housekeeping or managing your Housekeeping? N -  Some recent data might be hidden    Patient Care Team: Casey Berry, PA-C as PCP - General (Family Medicine) Casey Hodges, CNM as Midwife (Certified Nurse Midwife)  Indicate any recent Medical Services you may have received from other than [facility] providers in the past year (date may be approximate).     Assessment:   This is a routine wellness examination for Dhyana.  Hearing/Vision screen  Hearing Screening   125Hz  250Hz  500Hz  1000Hz  2000Hz  3000Hz  4000Hz  6000Hz  8000Hz   Right ear:           Left ear:           Comments: Pt denies hearing difficulty  Vision Screening Comments: Annual vision screenings done by Dr.  Dietary issues and exercise activities discussed: Current Exercise Habits: The patient does not participate in regular exercise at present, Exercise limited by: orthopedic condition(s)  Goals    . smoking cessation     Recommend to decrease smoking from 1 pack (20) to 1/2 pack (10) a day.        Depression Screen PHQ 2/9 Scores 10/13/2020 06/29/2020 03/16/2020 09/03/2019 06/04/2019 08/11/2018 03/11/2018  PHQ - 2 Score 2 6 0 2 2 6  0  PHQ- 9 Score 17 22 - 15 15 21 8     Fall Risk Fall Risk  10/13/2020 06/29/2020 03/16/2020 09/03/2019 06/04/2019  Falls in the past year? 1 1 0 0 0  Number falls in past yr: 0 0 0 0 0  Injury with Fall? 1 1 0 0 0  Risk Factor Category  - - - - -  Risk for fall due to : History of fall(s) - - - -  Follow up - - Falls evaluation completed Falls prevention discussed -  FALL RISK PREVENTION PERTAINING TO THE HOME:  Any stairs in or around the home? Yes  If so, are there any without handrails? No   Home free of loose throw rugs in walkways, pet beds, electrical cords, etc? Yes  Adequate lighting in your home to reduce risk of falls? Yes   ASSISTIVE DEVICES UTILIZED TO PREVENT FALLS:  Life alert? No  Use of a cane, walker or w/c? No  Grab bars in the bathroom? No  Shower chair or bench in shower? No  Elevated toilet seat or a handicapped toilet? Yes   TIMED UP AND GO:  Was the test performed? No . Telephonic visit.   Cognitive Function: Cognitive status assessed by direct observation. Patient c/o cognitive memory loss related to trauma and psychological disorder.      6CIT Screen 01/21/2017  What Year? 0 points  What month? 0 points  What time? 0 points  Count back from 20 0 points  Months in reverse 0 points  Repeat phrase 4 points  Total Score 4    Immunizations Immunization History  Administered Date(s) Administered  . Influenza,inj,Quad PF,6+ Mos 03/21/2015, 04/23/2016  . Pneumococcal Polysaccharide-23 09/22/2015  . Tdap 07/02/2017    TDAP status: Up to date  Flu Vaccine status: Declined, Education has been provided regarding the importance of this vaccine but patient still declined. Advised may receive this vaccine at local pharmacy or Health Dept. Aware to provide a copy of the vaccination record if obtained from local pharmacy or Health Dept. Verbalized acceptance and understanding.  Pneumococcal vaccine status: Up to date  Covid-19 vaccine status: Declined, Education has been provided regarding the importance of this vaccine but patient still declined. Advised may receive this vaccine at local pharmacy or Health Dept.or vaccine clinic. Aware to provide a copy of the vaccination record if obtained from local pharmacy or Health Dept. Verbalized acceptance and understanding.  Qualifies for Shingles Vaccine? No  due age 64  Screening Tests Health Maintenance  Topic Date Due  . MAMMOGRAM  08/20/2020  . COVID-19 Vaccine (1) 10/29/2020 (Originally 06/25/1987)   . INFLUENZA VACCINE  01/23/2021  . TETANUS/TDAP  07/03/2027  . Hepatitis C Screening  Completed  . HIV Screening  Completed  . HPV VACCINES  Aged Out  . PAP SMEAR-Modifier  Discontinued    Health Maintenance  Health Maintenance Due  Topic Date Due  . MAMMOGRAM  08/20/2020   Colon cancer screening: due age 78  Mammogram status: Completed 08/21/19. Repeat every year  Bone density screening: due age 18  Lung Cancer Screening: (Low Dose CT Chest recommended if Age 32-80 years, 30 pack-year currently smoking OR have quit w/in 15years.) does not qualify.   Additional Screening:  Hepatitis C Screening: does qualify; Completed 09/30/20  Vision Screening: Recommended annual ophthalmology exams for early detection of glaucoma and other disorders of the eye. Is the patient up to date with their annual eye exam?  Yes  Who is the provider or what is the name of the office in which the patient attends annual eye exams? Dr. Alvester Morin.   Dental Screening: Recommended annual dental exams for proper oral hygiene  Community Resource Referral / Chronic Care Management: CRR required this visit?  No   CCM required this visit?  No      Plan:     I have personally reviewed and noted the following in the patient's chart:   . Medical and social history . Use of alcohol, tobacco or illicit drugs  . Current  medications and supplements . Functional ability and status . Nutritional status . Physical activity . Advanced directives . List of other physicians . Hospitalizations, surgeries, and ER visits in previous 12 months . Vitals . Screenings to include cognitive, depression, and falls . Referrals and appointments  In addition, I have reviewed and discussed with patient certain preventive protocols, quality metrics, and best practice recommendations. A written personalized care plan for preventive services as well as general preventive health recommendations were provided to patient.      Reather Littler, LPN   1/61/0960   Nurse Notes: pt c/o fatigue, neck pain, back pain; concerned about posible B12 and vit d deficiency. Pt states recent labs from Gyn show early menopause due to hx of hysterectomy. Pt advised to schedue OV with PCP for labs and follow up.

## 2020-10-26 ENCOUNTER — Ambulatory Visit
Admission: RE | Admit: 2020-10-26 | Discharge: 2020-10-26 | Disposition: A | Discharge status: Home / Self Care | Payer: Medicare Other | Source: Ambulatory Visit | Attending: Certified Nurse Midwife | Admitting: Certified Nurse Midwife

## 2020-10-26 ENCOUNTER — Other Ambulatory Visit: Payer: Self-pay | Admitting: Certified Nurse Midwife

## 2020-10-26 ENCOUNTER — Other Ambulatory Visit: Payer: Self-pay

## 2020-10-26 DIAGNOSIS — Z1231 Encounter for screening mammogram for malignant neoplasm of breast: Secondary | ICD-10-CM

## 2020-10-26 DIAGNOSIS — N6310 Unspecified lump in the right breast, unspecified quadrant: Secondary | ICD-10-CM | POA: Insufficient documentation

## 2020-10-26 DIAGNOSIS — R928 Other abnormal and inconclusive findings on diagnostic imaging of breast: Secondary | ICD-10-CM | POA: Insufficient documentation

## 2020-11-15 ENCOUNTER — Other Ambulatory Visit: Payer: Self-pay

## 2020-11-15 ENCOUNTER — Encounter: Payer: Self-pay | Admitting: Family Medicine

## 2020-11-15 ENCOUNTER — Ambulatory Visit (INDEPENDENT_AMBULATORY_CARE_PROVIDER_SITE_OTHER): Payer: Medicare Other | Admitting: Family Medicine

## 2020-11-15 VITALS — BP 122/72 | HR 97 | Temp 98.2°F | Resp 16 | Ht 65.0 in | Wt 143.5 lb

## 2020-11-15 DIAGNOSIS — N200 Calculus of kidney: Secondary | ICD-10-CM

## 2020-11-15 DIAGNOSIS — R350 Frequency of micturition: Secondary | ICD-10-CM | POA: Diagnosis not present

## 2020-11-15 DIAGNOSIS — R109 Unspecified abdominal pain: Secondary | ICD-10-CM | POA: Diagnosis not present

## 2020-11-15 LAB — POCT URINALYSIS DIPSTICK
Bilirubin, UA: NEGATIVE
Glucose, UA: NEGATIVE
Ketones, UA: NEGATIVE
Leukocytes, UA: NEGATIVE
Nitrite, UA: NEGATIVE
Odor: NORMAL
Protein, UA: NEGATIVE
Spec Grav, UA: 1.015 (ref 1.010–1.025)
Urobilinogen, UA: 0.2 E.U./dL
pH, UA: 7.5 (ref 5.0–8.0)

## 2020-11-15 MED ORDER — CEPHALEXIN 500 MG PO CAPS: 500.0000 mg | ORAL_CAPSULE | Freq: Four times a day (QID) | ORAL | 0 refills | Status: AC

## 2020-11-15 NOTE — Patient Instructions (Signed)
You should hear from urology for an appointment  Start the antibiotic and we will call you with the culture results in a few days.   Urinary Tract Infection, Adult A urinary tract infection (UTI) is an infection of any part of the urinary tract. The urinary tract includes:  The kidneys.  The ureters.  The bladder.  The urethra. These organs make, store, and get rid of pee (urine) in the body. What are the causes? This infection is caused by germs (bacteria) in your genital area. These germs grow and cause swelling (inflammation) of your urinary tract. What increases the risk? The following factors may make you more likely to develop this condition:  Using a small, thin tube (catheter) to drain pee.  Not being able to control when you pee or poop (incontinence).  Being female. If you are female, these things can increase the risk: ? Using these methods to prevent pregnancy:  A medicine that kills sperm (spermicide).  A device that blocks sperm (diaphragm). ? Having low levels of a female hormone (estrogen). ? Being pregnant. You are more likely to develop this condition if:  You have genes that add to your risk.  You are sexually active.  You take antibiotic medicines.  You have trouble peeing because of: ? A prostate that is bigger than normal, if you are female. ? A blockage in the part of your body that drains pee from the bladder. ? A kidney stone. ? A nerve condition that affects your bladder. ? Not getting enough to drink. ? Not peeing often enough.  You have other conditions, such as: ? Diabetes. ? A weak disease-fighting system (immune system). ? Sickle cell disease. ? Gout. ? Injury of the spine. What are the signs or symptoms? Symptoms of this condition include:  Needing to pee right away.  Peeing small amounts often.  Pain or burning when peeing.  Blood in the pee.  Pee that smells bad or not like normal.  Trouble peeing.  Pee that is  cloudy.  Fluid coming from the vagina, if you are female.  Pain in the belly or lower back. Other symptoms include:  Vomiting.  Not feeling hungry.  Feeling mixed up (confused). This may be the first symptom in older adults.  Being tired and grouchy (irritable).  A fever.  Watery poop (diarrhea). How is this treated?  Taking antibiotic medicine.  Taking other medicines.  Drinking enough water. In some cases, you may need to see a specialist. Follow these instructions at home: Medicines  Take over-the-counter and prescription medicines only as told by your doctor.  If you were prescribed an antibiotic medicine, take it as told by your doctor. Do not stop taking it even if you start to feel better. General instructions  Make sure you: ? Pee until your bladder is empty. ? Do not hold pee for a long time. ? Empty your bladder after sex. ? Wipe from front to back after peeing or pooping if you are a female. Use each tissue one time when you wipe.  Drink enough fluid to keep your pee pale yellow.  Keep all follow-up visits.   Contact a doctor if:  You do not get better after 1-2 days.  Your symptoms go away and then come back. Get help right away if:  You have very bad back pain.  You have very bad pain in your lower belly.  You have a fever.  You have chills.  You feeling like you will  vomit or you vomit. Summary  A urinary tract infection (UTI) is an infection of any part of the urinary tract.  This condition is caused by germs in your genital area.  There are many risk factors for a UTI.  Treatment includes antibiotic medicines.  Drink enough fluid to keep your pee pale yellow. This information is not intended to replace advice given to you by your health care provider. Make sure you discuss any questions you have with your health care provider. Document Revised: 01/22/2020 Document Reviewed: 01/22/2020 Elsevier Patient Education  [place].

## 2020-11-15 NOTE — Progress Notes (Signed)
Patient ID: Casey Hodges, female    DOB: [birth year], 45 y.o.   MRN: [id number]  PCP: Danelle Berry, PA-C  Chief Complaint  Patient presents with   Urinary Tract Infection    Discomfort, frequency, burning, rt flank pain and hx of kidney stones    Subjective:   Casey Hodges is a 45 y.o. female, presents to clinic with CC of the following:  Urinary Tract Infection  This is a recurrent problem. The current episode started in the past 7 days. The problem occurs every urination. The problem has been unchanged. The quality of the pain is described as burning and aching. The pain is moderate. There has been no fever. Associated symptoms include flank pain, frequency and urgency. Pertinent negatives include no chills, discharge, hematuria, hesitancy, nausea, possible pregnancy, sweats or vomiting. She has tried nothing for the symptoms. Her past medical history is significant for kidney stones, recurrent UTIs and a urological procedure.      Results for orders placed or performed in visit on 11/15/20  POCT urinalysis dipstick  Result Value Ref Range   Color, UA yellow    Clarity, UA clear    Glucose, UA Negative Negative   Bilirubin, UA neg    Ketones, UA neg    Spec Grav, UA 1.015 1.010 - 1.025   Blood, UA trace    pH, UA 7.5 5.0 - 8.0   Protein, UA Negative Negative   Urobilinogen, UA 0.2 0.2 or 1.0 E.U./dL   Nitrite, UA neg    Leukocytes, UA Negative Negative   Appearance clear    Odor normal    Hx of UTI E. Coli and Enterococcus faecalis Hx of kidney stones    Patient Active Problem List   Diagnosis Date Noted   Substance induced mood disorder (HCC) 08/08/2020   Orthostasis 08/24/2016   Antibiotic-induced yeast infection 08/22/2016   Exercise-induced tachycardia 08/22/2016   Severe episode of recurrent major depressive disorder, without psychotic features (HCC)    GERD (gastroesophageal reflux disease) 09/16/2015   Smoker 07/25/2015   Snoring 06/09/2015   Cardiac  murmur 05/25/2015   Drug-induced nausea and vomiting 03/22/2015   Cyst, bone 02/14/2015   Pelvic adhesive disease 01/21/2015   Arthritis 12/19/2014   Anxiety disorder 12/13/2014   Postural dizziness 12/13/2014   Subcutaneous cyst 12/13/2014   Nephrolithiasis 05/23/2012   Chronic pelvic pain in female 05/23/2012   Fibromyalgia 05/23/2012   Bipolar disorder with depression (HCC) 06/05/2011      Current Outpatient Medications:    amphetamine-dextroamphetamine (ADDERALL XR) 10 MG 24 hr capsule, Take 10 mg by mouth daily., Disp: , Rfl:    diazepam (VALIUM) 5 MG tablet, Take 5 mg by mouth every 6 (six) hours as needed for anxiety., Disp: , Rfl:    zolpidem (AMBIEN) 10 MG tablet, Take 10 mg by mouth at bedtime., Disp: , Rfl:    Allergies  Allergen Reactions   Wellbutrin [Bupropion Hcl] Other (See Comments)    Reaction:  Suicidal    Lasix [Furosemide] Rash     Social History   Tobacco Use   Smoking status: Current Every Day Smoker    Packs/day: 1.00    Types: Cigarettes    Start date: 05/18/1995   Smokeless tobacco: Never Used   Tobacco comment: smoking cessation information provided in AVS  Vaping Use   Vaping Use: Some days   Substances: Nicotine  Substance Use Topics   Alcohol use: No    Alcohol/week: 0.0 standard drinks  Comment: Socially- seldom   Drug use: Yes    Types: Marijuana    Comment: daily      Chart Review Today: I personally reviewed active problem list, medication list, allergies, family history, social history, health maintenance, notes from last encounter, lab results, imaging with the patient/caregiver today.   Review of Systems  Constitutional: Negative.  Negative for chills.  HENT: Negative.    Eyes: Negative.   Respiratory: Negative.    Cardiovascular: Negative.   Gastrointestinal: Negative.  Negative for nausea and vomiting.  Endocrine: Negative.   Genitourinary:  Positive for flank pain, frequency and urgency. Negative for hematuria  and hesitancy.  Skin: Negative.   Allergic/Immunologic: Negative.   Neurological: Negative.   Hematological: Negative.   Psychiatric/Behavioral: Negative.    All other systems reviewed and are negative.     Objective:   Vitals:   11/15/20 1437  BP: 122/72  Pulse: 97  Resp: 16  Temp: 98.2 F (36.8 C)  Weight: 143 lb 8 oz (65.1 kg)  Height: 5\' 5"  (1.651 m)    Body mass index is 23.88 kg/m.  Physical Exam Vitals and nursing note reviewed.  Constitutional:      General: She is not in acute distress.    Appearance: Normal appearance. She is well-developed. She is not ill-appearing, toxic-appearing or diaphoretic.     Interventions: Face mask in place.  HENT:     Head: Normocephalic and atraumatic.     Right Ear: External ear normal.     Left Ear: External ear normal.  Eyes:     General: Lids are normal. No scleral icterus.       Right eye: No discharge.        Left eye: No discharge.     Conjunctiva/sclera: Conjunctivae normal.  Neck:     Trachea: Phonation normal. No tracheal deviation.  Cardiovascular:     Rate and Rhythm: Normal rate and regular rhythm.     Pulses: Normal pulses.          Radial pulses are 2+ on the right side and 2+ on the left side.       Posterior tibial pulses are 2+ on the right side and 2+ on the left side.     Heart sounds: Normal heart sounds. No murmur heard.   No friction rub. No gallop.  Pulmonary:     Effort: Pulmonary effort is normal. No respiratory distress.     Breath sounds: Normal breath sounds. No stridor. No wheezing, rhonchi or rales.  Chest:     Chest wall: No tenderness.  Abdominal:     General: Bowel sounds are normal. There is no distension.     Palpations: Abdomen is soft.     Tenderness: There is no abdominal tenderness. There is no right CVA tenderness, left CVA tenderness, guarding or rebound.  Musculoskeletal:        General: No deformity. Normal range of motion.     Cervical back: Normal range of motion and neck  supple.     Right lower leg: No edema.     Left lower leg: No edema.  Lymphadenopathy:     Cervical: No cervical adenopathy.  Skin:    General: Skin is warm and dry.     Capillary Refill: Capillary refill takes less than 2 seconds.     Coloration: Skin is not jaundiced or pale.     Findings: No rash.  Neurological:     Mental Status: She is alert and oriented  to person, place, and time.     Motor: No abnormal muscle tone.     Gait: Gait normal.  Psychiatric:        Speech: Speech normal.        Behavior: Behavior normal.     Results for orders placed or performed in visit on 11/15/20  POCT urinalysis dipstick  Result Value Ref Range   Color, UA yellow    Clarity, UA clear    Glucose, UA Negative Negative   Bilirubin, UA neg    Ketones, UA neg    Spec Grav, UA 1.015 1.010 - 1.025   Blood, UA trace    pH, UA 7.5 5.0 - 8.0   Protein, UA Negative Negative   Urobilinogen, UA 0.2 0.2 or 1.0 E.U./dL   Nitrite, UA neg    Leukocytes, UA Negative Negative   Appearance clear    Odor normal        Assessment & Plan:   1. Urinary frequency Trace blood Hx of complicated UTI and kidney stone Cover empirically with keflex Culture pending - POCT urinalysis dipstick - Ambulatory referral to Urology - Urine Culture  2. Right flank pain UTI vs kidney stone, no CVA ttp today, VSS, tolerating PO - POCT urinalysis dipstick - Ambulatory referral to Urology - Urine Culture  3. Nephrolithiasis Pt would like f/up with specialists - Ambulatory referral to Urology      Danelle Berry, PA-C 11/15/20 2:59 PM

## 2020-11-16 LAB — URINE CULTURE
MICRO NUMBER:: 11929129
Result:: NO GROWTH
SPECIMEN QUALITY:: ADEQUATE

## 2020-11-30 ENCOUNTER — Encounter: Payer: Self-pay | Admitting: Urology

## 2020-11-30 ENCOUNTER — Ambulatory Visit (INDEPENDENT_AMBULATORY_CARE_PROVIDER_SITE_OTHER): Payer: Medicare Other | Admitting: Urology

## 2020-11-30 ENCOUNTER — Other Ambulatory Visit: Payer: Self-pay

## 2020-11-30 VITALS — BP 118/74 | Ht 65.0 in | Wt 143.0 lb

## 2020-11-30 DIAGNOSIS — R109 Unspecified abdominal pain: Secondary | ICD-10-CM

## 2020-11-30 DIAGNOSIS — R3129 Other microscopic hematuria: Secondary | ICD-10-CM | POA: Diagnosis not present

## 2020-11-30 DIAGNOSIS — N393 Stress incontinence (female) (male): Secondary | ICD-10-CM | POA: Diagnosis not present

## 2020-11-30 DIAGNOSIS — Z87442 Personal history of urinary calculi: Secondary | ICD-10-CM

## 2020-11-30 LAB — URINALYSIS, COMPLETE
Bilirubin, UA: NEGATIVE
Glucose, UA: NEGATIVE
Leukocytes,UA: NEGATIVE
Nitrite, UA: NEGATIVE
Protein,UA: NEGATIVE
Specific Gravity, UA: 1.025 (ref 1.005–1.030)
Urobilinogen, Ur: 0.2 mg/dL (ref 0.2–1.0)
pH, UA: 6 (ref 5.0–7.5)

## 2020-11-30 LAB — MICROSCOPIC EXAMINATION

## 2020-11-30 NOTE — Patient Instructions (Signed)
Cystoscopy Cystoscopy is a procedure that is used to help diagnose and sometimes treat conditions that affect the lower urinary tract. The lower urinary tract includes the bladder and the urethra. The urethra is the tube that drains urine from the bladder. Cystoscopy is done using a thin, tube-shaped instrument with a light and camera at the end (cystoscope). The cystoscope may be hard or flexible, depending on the goal of the procedure. The cystoscope is inserted through the urethra, into the bladder. Cystoscopy may be recommended if you have:  Urinary tract infections that keep coming back.  Blood in the urine (hematuria).  An inability to control when you urinate (urinary incontinence) or an overactive bladder.  Unusual cells found in a urine sample.  A blockage in the urethra, such as a urinary stone.  Painful urination.  An abnormality in the bladder found during an intravenous pyelogram (IVP) or CT scan. Cystoscopy may also be done to remove a sample of tissue to be examined under a microscope (biopsy). What are the risks? Generally, this is a safe procedure. However, problems may occur, including:  Infection.  Bleeding.  What happens during the procedure?  1. You will be given one or more of the following: ? A medicine to numb the area (local anesthetic). 2. The area around the opening of your urethra will be cleaned. 3. The cystoscope will be passed through your urethra into your bladder. 4. Germ-free (sterile) fluid will flow through the cystoscope to fill your bladder. The fluid will stretch your bladder so that your health care provider can clearly examine your bladder walls. 5. Your doctor will look at the urethra and bladder. 6. The cystoscope will be removed The procedure may vary among health care providers  What can I expect after the procedure? After the procedure, it is common to have: 1. Some soreness or pain in your abdomen and urethra. 2. Urinary symptoms.  These include: ? Mild pain or burning when you urinate. Pain should stop within a few minutes after you urinate. This may last for up to 1 week. ? A small amount of blood in your urine for several days. ? Feeling like you need to urinate but producing only a small amount of urine. Follow these instructions at home: General instructions  Return to your normal activities as told by your health care provider.   Do not drive for 24 hours if you were given a sedative during your procedure.  Watch for any blood in your urine. If the amount of blood in your urine increases, call your health care provider.  If a tissue sample was removed for testing (biopsy) during your procedure, it is up to you to get your test results. Ask your health care provider, or the department that is doing the test, when your results will be ready.  Drink enough fluid to keep your urine pale yellow.  Keep all follow-up visits as told by your health care provider. This is important. Contact a health care provider if you:  Have pain that gets worse or does not get better with medicine, especially pain when you urinate.  Have trouble urinating.  Have more blood in your urine. Get help right away if you:  Have blood clots in your urine.  Have abdominal pain.  Have a fever or chills.  Are unable to urinate. Summary  Cystoscopy is a procedure that is used to help diagnose and sometimes treat conditions that affect the lower urinary tract.  Cystoscopy is done using   a thin, tube-shaped instrument with a light and camera at the end.  After the procedure, it is common to have some soreness or pain in your abdomen and urethra.  Watch for any blood in your urine. If the amount of blood in your urine increases, call your health care provider.  If you were prescribed an antibiotic medicine, take it as told by your health care provider. Do not stop taking the antibiotic even if you start to feel better. This  information is not intended to replace advice given to you by your health care provider. Make sure you discuss any questions you have with your health care provider. Document Revised: 06/03/2018 Document Reviewed: 06/03/2018 Elsevier Patient Education  2020 Elsevier Inc.   

## 2020-11-30 NOTE — Progress Notes (Signed)
11/30/2020 8:15 AM   Casey Hodges 1975/09/10 [id number]  Referring provider: Danelle Berry, PA-C [address] [place],  [place] [address]  Chief Complaint  Patient presents with   Nephrolithiasis    HPI: 45 year old female who presents today for further evaluation of urinary symptoms and history of kidney stones.  She was recently seen and evaluated by her primary care provider at which time she was having urinary symptoms including dysuria as well as right flank pain.  She had a point-of-care urine dip which was negative other than for trace blood.  Despite the negative dip, she was prescribed Keflex for presumed UTI.  Urine culture showed no growth.  Most recent CT scan in the form of CT stone protocol from 08/2019 showed punctate nonobstructing left lower pole calculus but otherwise an unremarkable scan.  Notably, at the time she is having right flank pain.  Today, she feels like her discomfort which is intermittent is consistent with her previous stones.  Is primarily on the right but sometimes on the left.  It comes and goes.  No alleviating or exacerbating factors.  She does have a small amount of microscopic blood in her urine today.  She notes today that she is undergone ureteroscopy with Dr. Alvester Morin in 2018.  She also has a remote history of retained calcified stent 20+ years ago for which she had to seek care at [facility] per her report.  She has no imaging supporting the diagnosis of stone currently.  She did request narcotic pain medication today.  She also mentions today that she has longstanding but worsening urinary incontinence.  This is primarily leakage with laughing, coughing, and sneezing.  This worsened significant of the past year.  She stopped to wear pads.  She had 1 biological pregnancy remotely.  No recent weight gain.   PMH: Past Medical History:  Diagnosis Date   Abnormal vaginal Pap smear    10+ years ago- no colpo repeat was normal   ADHD     Anxiety    AR (allergic rhinitis)    Bipolar affective (HCC)    pt reported   Bipolar disorder (HCC)    Bursitis of both hips    Calcium, deposits, in bursa    left hip   Eating disorder    Under control per patient   Fibromyalgia    GERD (gastroesophageal reflux disease)    Kidney stone    Painful intercourse    Painful menstrual periods    Pelvic pain in female    PTSD (post-traumatic stress disorder)    Renal disorder    Spinal stenosis    Uterine polyp    VWD (acquired von Willebrand's disease) (HCC)     Surgical History: Past Surgical History:  Procedure Laterality Date   ABDOMINAL HYSTERECTOMY     CYSTOSCOPY WITH STENT PLACEMENT Left 04/19/2017   Procedure: CYSTOSCOPY WITH STENT PLACEMENT;  Surgeon: Crista Elliot, MD;  Location: [facility] ORS;  Service: Urology;  Laterality: Left;   HYSTEROSCOPY     removed polyps   LAPAROSCOPIC VAGINAL HYSTERECTOMY  2015   at [facility]   LAPAROSCOPY Left 01/10/2015   Procedure: LAPAROSCOPY OPERATIVE with biopsy, left oopherectomy;  Surgeon: Herold Harms, MD;  Location: [facility] ORS;  Service: Gynecology;  Laterality: Left;   LAPAROSCOPY ABDOMEN DIAGNOSTIC     OOPHORECTOMY Left    TUBAL LIGATION     URETEROSCOPY WITH HOLMIUM LASER LITHOTRIPSY Left 04/19/2017   Procedure: URETEROSCOPY WITH HOLMIUM LASER  LITHOTRIPSY;  Surgeon: Crista Elliot, MD;  Location: [facility] ORS;  Service: Urology;  Laterality: Left;    Home Medications:  Allergies as of 11/30/2020       Reactions   Wellbutrin [bupropion Hcl] Other (See Comments)   Reaction:  Suicidal    Lasix [furosemide] Rash        Medication List        Accurate as of November 30, 2020 11:59 PM. If you have any questions, ask your nurse or doctor.          STOP taking these medications    amphetamine-dextroamphetamine 10 MG 24 hr capsule Commonly known as: ADDERALL XR Stopped by: Vanna Scotland, MD       TAKE these medications    diazepam 5 MG tablet Commonly  known as: VALIUM Take 5 mg by mouth every 6 (six) hours as needed for anxiety.   oxcarbazepine 600 MG tablet Commonly known as: TRILEPTAL Take 600 mg by mouth 2 (two) times daily.   Vraylar 4.5 MG Caps Generic drug: Cariprazine HCl Take 1 capsule by mouth daily.   zolpidem 10 MG tablet Commonly known as: AMBIEN Take 10 mg by mouth at bedtime.        Allergies:  Allergies  Allergen Reactions   Wellbutrin [Bupropion Hcl] Other (See Comments)    Reaction:  Suicidal    Lasix [Furosemide] Rash    Family History: Family History  Problem Relation Age of Onset   Hypertension Father    Kidney Stones Father    Sleep apnea Mother    Hypertension Mother    Kidney Stones Mother    Lymphoma Mother    Breast cancer Paternal Grandmother    Diabetes Maternal Grandmother    Diabetes Maternal Aunt    Diabetes Maternal Uncle     Social History:  reports that she has been smoking cigarettes. She started smoking about 25 years ago. She has been smoking an average of 1.00 packs per day. She has never used smokeless tobacco. She reports current drug use. Drug: Marijuana. She reports that she does not drink alcohol.   Physical Exam: BP 118/74   Ht 5\' 5"  (1.651 m)   Wt 143 lb (64.9 kg)   LMP 09/16/2015 Comment: neg preg test  BMI 23.80 kg/m   Constitutional:  Alert and oriented, No acute distress. HEENT: [place] AT, moist mucus membranes.  Trachea midline, no masses. Cardiovascular: No clubbing, cyanosis, or edema. Respiratory: Normal respiratory effort, no increased work of breathing. Skin: No rashes, bruises or suspicious lesions. Neurologic: Grossly intact, no focal deficits, moving all 4 extremities. Psychiatric: Normal mood and affect.  Laboratory Data: Lab Results  Component Value Date   WBC 7.7 08/07/2020   HGB 12.8 08/07/2020   HCT 38.3 08/07/2020   MCV 91.6 08/07/2020   PLT 249 08/07/2020    Lab Results  Component Value Date   CREATININE 0.69 09/23/2019    Lab  Results  Component Value Date   HGBA1C 5.2 09/21/2015    Urinalysis Results for orders placed or performed in visit on 11/30/20  Microscopic Examination   Urine  Result Value Ref Range   WBC, UA 0-5 0 - 5 /hpf   RBC 3-10 (A) 0 - 2 /hpf   Epithelial Cells (non renal) 0-10 0 - 10 /hpf   Bacteria, UA Few (A) None seen/Few  Urinalysis, Complete  Result Value Ref Range   Specific Gravity, UA 1.025 1.005 - 1.030   pH, UA 6.0 5.0 -  7.5   Color, UA Orange Yellow   Appearance Ur Cloudy (A) Clear   Leukocytes,UA Negative Negative   Protein,UA Negative Negative/Trace   Glucose, UA Negative Negative   Ketones, UA Trace (A) Negative   RBC, UA Trace (A) Negative   Bilirubin, UA Negative Negative   Urobilinogen, Ur 0.2 0.2 - 1.0 mg/dL   Nitrite, UA Negative Negative   Microscopic Examination See below:      Pertinent Imaging: CT scan from 08/2019 images were personally reviewed.  She does have a punctate left lower pole stone but otherwise no right-sided stones.  Assessment & Plan:    1. Right flank pain Possible right stone episode albeit she had a similar presentation last year with no documentation of stone  We discussed whether or not to pursue noncontrast CT scan versus KUB with or without renal ultrasound depending on what KUB shows, diagnostic sensitivity and specificity of each as well as cost  She like to pursue KUB today, if the stone is visualized, which will need no further imaging but if its not visualized, she is agreed to renal ultrasound  In terms of narcotic medication, the to hold off until were able to document that this in fact is attributable to a stone episode in the interim, she can alternate Tylenol and Motrin.  If her pain becomes severe, she can go to the emergency room for more prompt treatment. - Urinalysis, Complete - Ultrasound renal complete; Future - Abdomen 1 view (KUB); Future  2. Microscopic hematuria Incidental microscopic hematuria in a smoker,  may be related to #1  If we rule out #1, hematuria evaluation with cystoscopy would be warranted, she understands this  3. History of kidney stones History as outlined above  4. Stress incontinence, female Lengthy discussion today about her worsening stress urinary incontinence, we discussed the pathophysiology of this  We discussed surgical versus more conservative management.  She like to be referred to physical therapy as primary intervention at this time but will consider surgical intervention if this fails. - Ambulatory referral to Physical Therapy    Vanna Scotland, MD  [facility] [address] [place], [place] [address] [phone]

## 2020-12-06 ENCOUNTER — Telehealth: Payer: Self-pay

## 2020-12-06 NOTE — Telephone Encounter (Signed)
Left message for pt to return call.

## 2020-12-06 NOTE — Telephone Encounter (Signed)
-----   Message from Ashley Brandon, MD sent at 12/06/2020  8:23 AM EDT ----- This lady left our office last week to go get a KUB but never got it.  Can you please follow-up with her, she has a lot of issues that are up in the air that have not been addressed. 

## 2020-12-27 ENCOUNTER — Telehealth: Payer: Self-pay

## 2020-12-27 NOTE — Telephone Encounter (Signed)
-----   Message from Vanna Scotland, MD sent at 12/06/2020  8:23 AM EDT ----- This lady left our office last week to go get a KUB but never got it.  Can you please follow-up with her, she has a lot of issues that are up in the air that have not been addressed.

## 2021-01-04 ENCOUNTER — Other Ambulatory Visit: Payer: Self-pay | Admitting: Urology

## 2021-01-06 ENCOUNTER — Encounter: Payer: Self-pay | Admitting: Urology

## 2021-01-18 ENCOUNTER — Telehealth: Payer: Self-pay

## 2021-01-18 NOTE — Telephone Encounter (Signed)
I have been unable to reach patient. I did mail a letter to her today regarding her missed appointments for imaging and cystoscopy.

## 2021-01-18 NOTE — Telephone Encounter (Signed)
-----   Message from Ashley Brandon, MD sent at 12/06/2020  8:23 AM EDT ----- This lady left our office last week to go get a KUB but never got it.  Can you please follow-up with her, she has a lot of issues that are up in the air that have not been addressed. 

## 2021-02-27 ENCOUNTER — Encounter: Payer: Self-pay | Admitting: Emergency Medicine

## 2021-02-27 ENCOUNTER — Emergency Department
Admission: EM | Admit: 2021-02-27 | Discharge: 2021-02-27 | Disposition: A | Discharge status: Home / Self Care | Payer: Medicare Other | Attending: Emergency Medicine | Admitting: Emergency Medicine

## 2021-02-27 ENCOUNTER — Other Ambulatory Visit: Payer: Self-pay

## 2021-02-27 ENCOUNTER — Emergency Department: Payer: Medicare Other

## 2021-02-27 DIAGNOSIS — W1839XA Other fall on same level, initial encounter: Secondary | ICD-10-CM | POA: Diagnosis not present

## 2021-02-27 DIAGNOSIS — M79672 Pain in left foot: Secondary | ICD-10-CM | POA: Insufficient documentation

## 2021-02-27 DIAGNOSIS — S92212A Displaced fracture of cuboid bone of left foot, initial encounter for closed fracture: Secondary | ICD-10-CM | POA: Insufficient documentation

## 2021-02-27 DIAGNOSIS — F17210 Nicotine dependence, cigarettes, uncomplicated: Secondary | ICD-10-CM | POA: Insufficient documentation

## 2021-02-27 DIAGNOSIS — S99922A Unspecified injury of left foot, initial encounter: Secondary | ICD-10-CM | POA: Diagnosis present

## 2021-02-27 MED ORDER — OXYCODONE-ACETAMINOPHEN 5-325 MG PO TABS: 1.0000 | ORAL_TABLET | ORAL | 0 refills | Status: DC | PRN

## 2021-02-27 MED ORDER — OXYCODONE-ACETAMINOPHEN 5-325 MG PO TABS
1.0000 | ORAL_TABLET | Freq: Once | ORAL | Status: AC
Start: 2021-02-27 — End: 2021-02-27
  Administered 2021-02-27: 1 via ORAL
  Filled 2021-02-27: qty 1

## 2021-02-27 NOTE — ED Provider Notes (Signed)
[facility] Emergency Department Provider Note  ____________________________________________   Event Date/Time   First MD Initiated Contact with Patient 02/27/21 [phone]     (approximate)  I have reviewed the triage vital signs and the nursing notes.   HISTORY  Chief Complaint Fall    HPI Casey Hodges is a 45 y.o. female presents emergency department with left foot pain.  Patient states that she twisted the foot over several times.  Unable to bear weight.  States has large amount of bruising noticed.  No other injuries reported symptoms happened last night  Past Medical History:  Diagnosis Date   Abnormal vaginal Pap smear    10+ years ago- no colpo repeat was normal   ADHD    Anxiety    AR (allergic rhinitis)    Bipolar affective (HCC)    pt reported   Bipolar disorder (HCC)    Bursitis of both hips    Calcium, deposits, in bursa    left hip   Eating disorder    Under control per patient   Fibromyalgia    GERD (gastroesophageal reflux disease)    Kidney stone    Painful intercourse    Painful menstrual periods    Pelvic pain in female    PTSD (post-traumatic stress disorder)    Renal disorder    Spinal stenosis    Uterine polyp    VWD (acquired von Willebrand's disease) [facility])     Patient Active Problem List   Diagnosis Date Noted   Substance induced mood disorder (HCC) 08/08/2020   Orthostasis 08/24/2016   Antibiotic-induced yeast infection 08/22/2016   Exercise-induced tachycardia 08/22/2016   Severe episode of recurrent major depressive disorder, without psychotic features (HCC)    GERD (gastroesophageal reflux disease) 09/16/2015   Smoker 07/25/2015   Snoring 06/09/2015   Cardiac murmur 05/25/2015   Drug-induced nausea and vomiting 03/22/2015   Cyst, bone 02/14/2015   Pelvic adhesive disease 01/21/2015   Arthritis 12/19/2014   Anxiety disorder 12/13/2014   Postural dizziness 12/13/2014   Subcutaneous cyst 12/13/2014    Nephrolithiasis 05/23/2012   Chronic pelvic pain in female 05/23/2012   Fibromyalgia 05/23/2012   Bipolar disorder with depression (HCC) 06/05/2011    Past Surgical History:  Procedure Laterality Date   ABDOMINAL HYSTERECTOMY     CYSTOSCOPY WITH STENT PLACEMENT Left 04/19/2017   Procedure: CYSTOSCOPY WITH STENT PLACEMENT;  Surgeon: Crista Elliot, MD;  Location: [facility] ORS;  Service: Urology;  Laterality: Left;   HYSTEROSCOPY     removed polyps   LAPAROSCOPIC VAGINAL HYSTERECTOMY  2015   at [facility]   LAPAROSCOPY Left 01/10/2015   Procedure: LAPAROSCOPY OPERATIVE with biopsy, left oopherectomy;  Surgeon: Herold Harms, MD;  Location: [facility] ORS;  Service: Gynecology;  Laterality: Left;   LAPAROSCOPY ABDOMEN DIAGNOSTIC     OOPHORECTOMY Left    TUBAL LIGATION     URETEROSCOPY WITH HOLMIUM LASER LITHOTRIPSY Left 04/19/2017   Procedure: URETEROSCOPY WITH HOLMIUM LASER LITHOTRIPSY;  Surgeon: Crista Elliot, MD;  Location: [facility] ORS;  Service: Urology;  Laterality: Left;    Prior to Admission medications   Medication Sig Start Date End Date Taking? Authorizing Provider  oxyCODONE-acetaminophen (PERCOCET) 5-325 MG tablet Take 1 tablet by mouth every 4 (four) hours as needed for severe pain. 02/27/21 02/27/22 Yes Ramesh Moan, Roselyn Bering, PA-C  diazepam (VALIUM) 5 MG tablet Take 5 mg by mouth every 6 (six) hours as needed for anxiety.    [provider]  oxcarbazepine (TRILEPTAL) 600 MG tablet Take 600 mg by mouth 2 (two) times daily. 11/29/20   [provider]  VRAYLAR 4.5 MG CAPS Take 1 capsule by mouth daily. 11/29/20   [provider]  zolpidem (AMBIEN) 10 MG tablet Take 10 mg by mouth at bedtime. 08/01/20   [provider]    Allergies Wellbutrin [bupropion hcl] and Lasix [furosemide]  Family History  Problem Relation Age of Onset   Hypertension Father    Kidney Stones Father    Sleep apnea Mother    Hypertension Mother    Kidney Stones Mother     Lymphoma Mother    Breast cancer Paternal Grandmother    Diabetes Maternal Grandmother    Diabetes Maternal Aunt    Diabetes Maternal Uncle     Social History Social History   Tobacco Use   Smoking status: Every Day    Packs/day: 1.00    Types: Cigarettes    Start date: 05/18/1995   Smokeless tobacco: Never   Tobacco comments:    smoking cessation information provided in AVS  Vaping Use   Vaping Use: Some days   Substances: Nicotine  Substance Use Topics   Alcohol use: No    Alcohol/week: 0.0 standard drinks    Comment: Socially- seldom   Drug use: Yes    Types: Marijuana    Comment: daily    Review of Systems  Constitutional: No fever/chills Eyes: No visual changes. ENT: No sore throat. Respiratory: Denies cough Genitourinary: Negative for dysuria. Musculoskeletal: Negative for back pain.  Positive left foot pain Skin: Negative for rash. Psychiatric: no mood changes,     ____________________________________________   PHYSICAL EXAM:  VITAL SIGNS: ED Triage Vitals  Enc Vitals Group     BP 02/27/21 0627 112/76     Pulse Rate 02/27/21 0627 97     Resp 02/27/21 0627 20     Temp 02/27/21 0627 98.4 F (36.9 C)     Temp Source 02/27/21 0627 Oral     SpO2 02/27/21 0627 100 %     Weight 02/27/21 0717 143 lb 1.3 oz (64.9 kg)     Height 02/27/21 0717 5\' 5"  (1.651 m)     Head Circumference --      Peak Flow --      Pain Score --      Pain Loc --      Pain Edu? --      Excl. in GC? --     Constitutional: Alert and oriented. Well appearing and in no acute distress. Eyes: Conjunctivae are normal.  Head: Atraumatic. Nose: No congestion/rhinnorhea. Mouth/Throat: Mucous membranes are moist.   Neck:  supple no lymphadenopathy noted Cardiovascular: Normal rate, regular rhythm.  Respiratory: Normal respiratory effort.  No retractions, GU: deferred Musculoskeletal: FROM all extremities, warm and well perfused, left foot is bruised and tender across the midfoot,  neurovascular is intact, patient cannot bear weight without difficulty Neurologic:  Normal speech and language.  Skin:  Skin is warm, dry and intact. No rash noted. Psychiatric: Mood and affect are normal. Speech and behavior are normal.  ____________________________________________   LABS (all labs ordered are listed, but only abnormal results are displayed)  Labs Reviewed - No data to display ____________________________________________   ____________________________________________  RADIOLOGY  X-ray of the left ankle and foot ordered from triage  ____________________________________________   PROCEDURES  Procedure(s) performed:   .Ortho Injury Treatment  Date/Time: 02/27/2021 9:43 AM Performed by: 04/29/2021, PA-C Authorized  by: Faythe Ghee, PA-C   Consent:    Consent obtained:  Verbal   Consent given by:  Patient   Risks discussed:  Fracture, nerve damage, restricted joint movement, vascular damage and stiffness   Alternatives discussed:  ReferralInjury location: foot Location details: left foot Injury type: fracture Fracture type: cuboid Pre-procedure neurovascular assessment: neurovascularly intact Pre-procedure distal perfusion: normal Pre-procedure neurological function: normal Pre-procedure range of motion: normal  Anesthesia: Local anesthesia used: no  Patient sedated: NoManipulation performed: no Immobilization: brace and crutches Splint Applied by: ED Tech Post-procedure neurovascular assessment: post-procedure neurovascularly intact Post-procedure distal perfusion: normal Post-procedure neurological function: normal Post-procedure range of motion: normal Comments: Short leg walking boot      ____________________________________________   INITIAL IMPRESSION / ASSESSMENT AND PLAN / ED COURSE  Pertinent labs & imaging results that were available during my care of the patient were reviewed by me and considered in my medical decision  making (see chart for details).   Patient's 45 year old female presents emergency department with left foot pain.  See HPI.  Physical exam shows patient to have a bruised swollen and tender left foot.  X-ray of the left foot shows a cuboid fracture.  This was reviewed by me and confirmed by radiology  Fracture care included short walking boot, crutches, pain medication, patient was given a prescription for Percocet.  She is to take Tylenol and ibuprofen prior to taking this medication.  Elevate and ice.  Follow-up with orthopedics.  She is discharged stable condition     Casey Hodges was evaluated in Emergency Department on 02/27/2021 for the symptoms described in the history of present illness. She was evaluated in the context of the global COVID-19 pandemic, which necessitated consideration that the patient might be at risk for infection with the SARS-CoV-2 virus that causes COVID-19. Institutional protocols and algorithms that pertain to the evaluation of patients at risk for COVID-19 are in a state of rapid change based on information released by regulatory bodies including the CDC and federal and state organizations. These policies and algorithms were followed during the patient's care in the ED.    As part of my medical decision making, I reviewed the following data within the electronic MEDICAL RECORD NUMBER Nursing notes reviewed and incorporated, Old chart reviewed, Radiograph reviewed , Notes from prior ED visits, and [place] Controlled Substance Database  ____________________________________________   FINAL CLINICAL IMPRESSION(S) / ED DIAGNOSES  Final diagnoses:  Closed displaced fracture of cuboid of left foot, initial encounter      NEW MEDICATIONS STARTED DURING THIS VISIT:  New Prescriptions   OXYCODONE-ACETAMINOPHEN (PERCOCET) 5-325 MG TABLET    Take 1 tablet by mouth every 4 (four) hours as needed for severe pain.     Note:  This document was prepared using Dragon voice  recognition software and may include unintentional dictation errors.    Faythe Ghee, PA-C 02/27/21 0946    Georga Hacking, MD 02/27/21 [phone]

## 2021-02-27 NOTE — Discharge Instructions (Signed)
You have a cuboid fracture.  This is similar to a cuneiform fracture.   Elevate and ice your foot Take tylenol or ibuprofen for pain Percocet for pain not controlled by these medications Follow up with podiatry, ask for the [facility] office Return if worsening

## 2021-02-27 NOTE — ED Notes (Signed)
See triage note  Presents s/p fall  Having pain and swelling to lateral left foot  Bruising noted  Unable to bear full wt  Tearful on arrival to room  Good pulses

## 2021-02-27 NOTE — ED Triage Notes (Signed)
Pt arrived via [facility] from home where he called out due to increased pain and swelling in the left foot/ankle from injury last night. Per pt, she hit the tile floor with left foot. Bruising and swelling noted.

## 2021-03-01 ENCOUNTER — Ambulatory Visit (INDEPENDENT_AMBULATORY_CARE_PROVIDER_SITE_OTHER): Payer: Medicare Other | Admitting: Podiatry

## 2021-03-01 ENCOUNTER — Encounter: Payer: Self-pay | Admitting: Podiatry

## 2021-03-01 ENCOUNTER — Other Ambulatory Visit: Payer: Self-pay

## 2021-03-01 DIAGNOSIS — S92212A Displaced fracture of cuboid bone of left foot, initial encounter for closed fracture: Secondary | ICD-10-CM

## 2021-03-01 MED ORDER — OXYCODONE-ACETAMINOPHEN 5-325 MG PO TABS: 1.0000 | ORAL_TABLET | ORAL | 0 refills | Status: AC | PRN

## 2021-03-04 NOTE — Progress Notes (Signed)
  Subjective:  Patient ID: Casey Hodges, female    DOB: 10-25-1975,  MRN: [id number]  Chief Complaint  Patient presents with   Fracture    Patient fell, has Fx Left foot-ER referring happened 09.04.22, xrays done 02/27/21    45 y.o. female presents with the above complaint. History confirmed with patient.  Describes as throbbing pain and keeps her awake at night.  Her foot fell asleep while watching TV and when she stood up her ankle gave out and rolled under her as she tried to get up  Objective:  Physical Exam: warm, good capillary refill, no trophic changes or ulcerative lesions, normal DP and PT pulses, and normal sensory exam. Left Foot: Pain and ecchymosis over the lateral midfoot  Radiographs: X-rays from the ER reviewed and there is a nondisplaced extra-articular cuboid fracture Assessment:   1. Closed displaced fracture of cuboid of left foot, initial encounter      Plan:  Patient was evaluated and treated and all questions answered.  Reviewed the radiographic findings with her and discussed treatment options for this.  I recommended nonoperative treatment with nonweightbearing in a cam boot.  Likely can transition to weightbearing in the boot in 4 to 6 weeks.  She will follow-up in 4 weeks for x-rays.  I discussed the impact of smoking on bone healing and she will work on quitting.  I did prescribe her pain medication for symptomatic control discussed RICE protocol and advised to take OTC NSAIDs as needed as well but she does have a history of gastric reflux and this can be difficult for her.  Return in about 4 weeks (around 03/29/2021) for fracture f/u (new x-rays).

## 2021-03-29 ENCOUNTER — Ambulatory Visit (INDEPENDENT_AMBULATORY_CARE_PROVIDER_SITE_OTHER): Payer: Medicare Other

## 2021-03-29 ENCOUNTER — Other Ambulatory Visit: Payer: Self-pay

## 2021-03-29 ENCOUNTER — Ambulatory Visit (INDEPENDENT_AMBULATORY_CARE_PROVIDER_SITE_OTHER): Payer: Medicare Other | Admitting: Podiatry

## 2021-03-29 ENCOUNTER — Encounter: Payer: Self-pay | Admitting: Podiatry

## 2021-03-29 DIAGNOSIS — S92212A Displaced fracture of cuboid bone of left foot, initial encounter for closed fracture: Secondary | ICD-10-CM

## 2021-03-29 MED ORDER — IBUPROFEN 600 MG PO TABS: 600.0000 mg | ORAL_TABLET | Freq: Four times a day (QID) | ORAL | 0 refills | Status: AC | PRN

## 2021-03-29 NOTE — Patient Instructions (Signed)
Look for an "EvenUp" shoe attachment on [employer] or at [employer]. This will level out your hips while you are walking in the CAM boot. Wear this on the other foot around a supportive sneaker:     

## 2021-03-30 ENCOUNTER — Ambulatory Visit
Admission: EM | Admit: 2021-03-30 | Discharge: 2021-03-30 | Disposition: A | Discharge status: Home / Self Care | Payer: Medicare Other | Attending: Physician Assistant | Admitting: Physician Assistant

## 2021-03-30 ENCOUNTER — Encounter: Payer: Self-pay | Admitting: Emergency Medicine

## 2021-03-30 DIAGNOSIS — R109 Unspecified abdominal pain: Secondary | ICD-10-CM | POA: Diagnosis present

## 2021-03-30 DIAGNOSIS — N12 Tubulo-interstitial nephritis, not specified as acute or chronic: Secondary | ICD-10-CM | POA: Insufficient documentation

## 2021-03-30 DIAGNOSIS — N390 Urinary tract infection, site not specified: Secondary | ICD-10-CM | POA: Diagnosis present

## 2021-03-30 LAB — URINALYSIS, COMPLETE (UACMP) WITH MICROSCOPIC
Bilirubin Urine: NEGATIVE
Glucose, UA: NEGATIVE mg/dL
Leukocytes,Ua: NEGATIVE
Nitrite: POSITIVE — AB
Protein, ur: 30 mg/dL — AB
Specific Gravity, Urine: 1.025 (ref 1.005–1.030)
pH: 7 (ref 5.0–8.0)

## 2021-03-30 MED ORDER — PHENAZOPYRIDINE HCL 200 MG PO TABS: 200.0000 mg | ORAL_TABLET | Freq: Three times a day (TID) | ORAL | 0 refills | Status: DC

## 2021-03-30 MED ORDER — KETOROLAC TROMETHAMINE 10 MG PO TABS: 10.0000 mg | ORAL_TABLET | Freq: Four times a day (QID) | ORAL | 0 refills | Status: DC | PRN

## 2021-03-30 MED ORDER — CIPROFLOXACIN HCL 500 MG PO TABS: 500.0000 mg | ORAL_TABLET | Freq: Two times a day (BID) | ORAL | 0 refills | Status: DC

## 2021-03-30 MED ORDER — TAMSULOSIN HCL 0.4 MG PO CAPS: 0.4000 mg | ORAL_CAPSULE | Freq: Every day | ORAL | 0 refills | Status: DC

## 2021-03-30 MED ORDER — KETOROLAC TROMETHAMINE 60 MG/2ML IM SOLN
60.0000 mg | Freq: Once | INTRAMUSCULAR | Status: AC
  Administered 2021-03-30: 60 mg via INTRAMUSCULAR

## 2021-03-30 NOTE — ED Provider Notes (Signed)
[facility]    CSN: [id number] Arrival date & time: 03/30/21  1139      History   Chief Complaint Chief Complaint  Patient presents with   Dysuria    HPI Casey Hodges is a 45 y.o. female who presents with dysuria x 3 days Denies fever.  Onset of R flank pain 2 days ago. Has hx of renal stones x 23 years and has had 2 stents placed in the past. She does not know if there were more stones since the last one she passed.     Past Medical History:  Diagnosis Date   Abnormal vaginal Pap smear    10+ years ago- no colpo repeat was normal   ADHD    Anxiety    AR (allergic rhinitis)    Bipolar affective (HCC)    pt reported   Bipolar disorder (HCC)    Bursitis of both hips    Calcium, deposits, in bursa    left hip   Eating disorder    Under control per patient   Fibromyalgia    GERD (gastroesophageal reflux disease)    Kidney stone    Painful intercourse    Painful menstrual periods    Pelvic pain in female    PTSD (post-traumatic stress disorder)    Renal disorder    Spinal stenosis    Uterine polyp    VWD (acquired von Willebrand's disease)     Patient Active Problem List   Diagnosis Date Noted   Substance induced mood disorder (HCC) 08/08/2020   Orthostasis 08/24/2016   Antibiotic-induced yeast infection 08/22/2016   Exercise-induced tachycardia 08/22/2016   Severe episode of recurrent major depressive disorder, without psychotic features (HCC)    GERD (gastroesophageal reflux disease) 09/16/2015   Smoker 07/25/2015   Snoring 06/09/2015   Cardiac murmur 05/25/2015   Drug-induced nausea and vomiting 03/22/2015   Cyst, bone 02/14/2015   Pelvic adhesive disease 01/21/2015   Arthritis 12/19/2014   Anxiety disorder 12/13/2014   Postural dizziness 12/13/2014   Subcutaneous cyst 12/13/2014   Nephrolithiasis 05/23/2012   Chronic pelvic pain in female 05/23/2012   Fibromyalgia 05/23/2012   Bipolar disorder with depression (HCC) 06/05/2011     Past Surgical History:  Procedure Laterality Date   ABDOMINAL HYSTERECTOMY     CYSTOSCOPY WITH STENT PLACEMENT Left 04/19/2017   Procedure: CYSTOSCOPY WITH STENT PLACEMENT;  Surgeon: Crista Elliot, MD;  Location: [facility] ORS;  Service: Urology;  Laterality: Left;   HYSTEROSCOPY     removed polyps   LAPAROSCOPIC VAGINAL HYSTERECTOMY  2015   at [facility]   LAPAROSCOPY Left 01/10/2015   Procedure: LAPAROSCOPY OPERATIVE with biopsy, left oopherectomy;  Surgeon: Herold Harms, MD;  Location: [facility] ORS;  Service: Gynecology;  Laterality: Left;   LAPAROSCOPY ABDOMEN DIAGNOSTIC     OOPHORECTOMY Left    TUBAL LIGATION     URETEROSCOPY WITH HOLMIUM LASER LITHOTRIPSY Left 04/19/2017   Procedure: URETEROSCOPY WITH HOLMIUM LASER LITHOTRIPSY;  Surgeon: Crista Elliot, MD;  Location: [facility] ORS;  Service: Urology;  Laterality: Left;    OB History     Gravida  1   Para  1   Term  1   Preterm      AB      Living  1      SAB      IAB      Ectopic      Multiple      Live Births  1  Home Medications    Prior to Admission medications   Medication Sig Start Date End Date Taking? Authorizing Provider  ciprofloxacin (CIPRO) 500 MG tablet Take 1 tablet (500 mg total) by mouth 2 (two) times daily. 03/30/21  Yes Rodriguez-Southworth, Nettie Elm, PA-C  ketorolac (TORADOL) 10 MG tablet Take 1 tablet (10 mg total) by mouth every 6 (six) hours as needed. 03/30/21  Yes Rodriguez-Southworth, Nettie Elm, PA-C  phenazopyridine (PYRIDIUM) 200 MG tablet Take 1 tablet (200 mg total) by mouth 3 (three) times daily. 03/30/21  Yes Rodriguez-Southworth, Nettie Elm, PA-C  tamsulosin (FLOMAX) 0.4 MG CAPS capsule Take 1 capsule (0.4 mg total) by mouth daily. 03/30/21  Yes Rodriguez-Southworth, Nettie Elm, PA-C  clonazePAM (KLONOPIN) 1 MG tablet Take 1 mg by mouth 2 (two) times daily. 01/25/21   [provider]  diazepam (VALIUM) 5 MG tablet Take 5 mg by mouth every 6 (six) hours as needed  for anxiety.    [provider]  ibuprofen (ADVIL) 600 MG tablet Take 1 tablet (600 mg total) by mouth every 6 (six) hours as needed for up to 14 days. 03/29/21 04/12/21  McDonald, Rachelle Hora, DPM  oxcarbazepine (TRILEPTAL) 600 MG tablet Take 600 mg by mouth 2 (two) times daily. 11/29/20   [provider]  VRAYLAR 4.5 MG CAPS Take 1 capsule by mouth daily. 11/29/20   [provider]  zolpidem (AMBIEN) 10 MG tablet Take 10 mg by mouth at bedtime. 08/01/20   [provider]    Family History Family History  Problem Relation Age of Onset   Hypertension Father    Kidney Stones Father    Sleep apnea Mother    Hypertension Mother    Kidney Stones Mother    Lymphoma Mother    Breast cancer Paternal Grandmother    Diabetes Maternal Grandmother    Diabetes Maternal Aunt    Diabetes Maternal Uncle     Social History Social History   Tobacco Use   Smoking status: Every Day    Packs/day: 1.00    Types: Cigarettes    Start date: 05/18/1995   Smokeless tobacco: Never   Tobacco comments:    smoking cessation information provided in AVS  Vaping Use   Vaping Use: Some days   Substances: Nicotine  Substance Use Topics   Alcohol use: No    Alcohol/week: 0.0 standard drinks    Comment: Socially- seldom   Drug use: Yes    Types: Marijuana    Comment: daily     Allergies   Wellbutrin [bupropion hcl] and Lasix [furosemide]   Review of Systems Review of Systems  Constitutional:  Negative for appetite change, chills, diaphoresis and fever.  Genitourinary:  Positive for dysuria, flank pain, frequency and urgency. Negative for hematuria.  Musculoskeletal:  Positive for gait problem.       Has fractured L foot  Skin:  Negative for rash.    Physical Exam Triage Vital Signs ED Triage Vitals  Enc Vitals Group     BP 03/30/21 1318 120/83     Pulse Rate 03/30/21 1318 94     Resp 03/30/21 1318 18     Temp 03/30/21 1318 98.6 F (37 C)     Temp Source 03/30/21  1318 Oral     SpO2 03/30/21 1318 99 %     Weight --      Height --      Head Circumference --      Peak Flow --      Pain Score 03/30/21  1315 7     Pain Loc --      Pain Edu? --      Excl. in GC? --    No data found.  Updated Vital Signs BP 120/83 (BP Location: Left Arm)   Pulse 94   Temp 98.6 F (37 C) (Oral)   Resp 18   LMP 09/16/2015 Comment: neg preg test  SpO2 99%   Visual Acuity Right Eye Distance:   Left Eye Distance:   Bilateral Distance:    Right Eye Near:   Left Eye Near:    Bilateral Near:     Physical Exam Physical Exam Vitals and nursing note reviewed.  Constitutional:      General: She is not in acute distress.    Appearance: She is not toxic-appearing.  HENT:     Head: Normocephalic.     Right Ear: External ear normal.     Left Ear: External ear normal.  Eyes:     General: No scleral icterus.    Conjunctiva/sclera: Conjunctivae normal.  Pulmonary:     Effort: Pulmonary effort is normal.  Abdominal:     General: Bowel sounds are normal.     Palpations: Abdomen is soft. There is no mass. Is tender on L mid abd and suprapubic region    Tenderness: There is no guarding or rebound.     Comments: + CVA tenderness   Musculoskeletal:        General: Normal range of motion.     Cervical back: Neck supple.     Comments: BACK- has muscular tenderness on area of complaint with palpation  Skin:    General: Skin is warm and dry.     Findings: No rash.  Neurological:     Mental Status: She is alert and oriented to person, place, and time.     Gait: Gait normal.  Psychiatric:        Mood and Affect: Mood normal.        Behavior: Behavior normal.        Thought Content: Thought content normal.        Judgment: Judgment normal.    UC Treatments / Results  Labs (all labs ordered are listed, but only abnormal results are displayed) Labs Reviewed  URINALYSIS, COMPLETE (UACMP) WITH MICROSCOPIC - Abnormal; Notable for the following components:       Result Value   Hgb urine dipstick TRACE (*)    Ketones, ur TRACE (*)    Protein, ur 30 (*)    Nitrite POSITIVE (*)    Bacteria, UA FEW (*)    All other components within normal limits  URINE CULTURE    EKG   Radiology DG Foot Complete Left  Result Date: 03/29/2021 Please see detailed radiograph report in office note.   Procedures Procedures (including critical care time)  Medications Ordered in UC Medications  ketorolac (TORADOL) injection 60 mg (has no administration in time range)    Initial Impression / Assessment and Plan / UC Course  I have reviewed the triage vital signs and the nursing notes. Pertinent labs results that were available during my care of the patient were reviewed by me and considered in my medical decision making (see chart for details). Uti with L flank pain which could be renal stone. She does not want to stay any longer for scanning and is willing to take medication to see if she gets better.  She was given Toradol 60 mg IM I placed her on Cipro,  Pyridium, Flomax as noted.  See instructions.     Final Clinical Impressions(s) / UC Diagnoses   Final diagnoses:  Flank pain  Urinary tract infection without hematuria, site unspecified  Pyelonephritis     Discharge Instructions      Go to ER if your flank pain does not improve in 48 hours or you get worse.      ED Prescriptions     Medication Sig Dispense Auth. Provider   tamsulosin (FLOMAX) 0.4 MG CAPS capsule Take 1 capsule (0.4 mg total) by mouth daily. 5 capsule Rodriguez-Southworth, Nettie Elm, PA-C   phenazopyridine (PYRIDIUM) 200 MG tablet Take 1 tablet (200 mg total) by mouth 3 (three) times daily. 6 tablet Rodriguez-Southworth, Nettie Elm, PA-C   ciprofloxacin (CIPRO) 500 MG tablet Take 1 tablet (500 mg total) by mouth 2 (two) times daily. 14 tablet Rodriguez-Southworth, Johann Santone, PA-C   ketorolac (TORADOL) 10 MG tablet Take 1 tablet (10 mg total) by mouth every 6 (six) hours as needed. 20  tablet Rodriguez-Southworth, Nettie Elm, PA-C      I have reviewed the PDMP during this encounter.   Garey Ham, PA-C 03/30/21 1424

## 2021-03-30 NOTE — Discharge Instructions (Addendum)
Go to ER if your flank pain does not improve in 48 hours or you get worse.

## 2021-03-30 NOTE — ED Triage Notes (Signed)
Pt presents today with c/o painful urination and right flank pain x 3. Denies fever.

## 2021-04-02 ENCOUNTER — Emergency Department: Payer: Medicare Other

## 2021-04-02 ENCOUNTER — Encounter: Payer: Self-pay | Admitting: Emergency Medicine

## 2021-04-02 ENCOUNTER — Emergency Department
Admission: EM | Admit: 2021-04-02 | Discharge: 2021-04-02 | Disposition: A | Discharge status: Home / Self Care | Payer: Medicare Other | Attending: Emergency Medicine | Admitting: Emergency Medicine

## 2021-04-02 ENCOUNTER — Other Ambulatory Visit: Payer: Self-pay

## 2021-04-02 DIAGNOSIS — F17210 Nicotine dependence, cigarettes, uncomplicated: Secondary | ICD-10-CM | POA: Insufficient documentation

## 2021-04-02 DIAGNOSIS — K219 Gastro-esophageal reflux disease without esophagitis: Secondary | ICD-10-CM | POA: Insufficient documentation

## 2021-04-02 DIAGNOSIS — R109 Unspecified abdominal pain: Secondary | ICD-10-CM

## 2021-04-02 DIAGNOSIS — R300 Dysuria: Secondary | ICD-10-CM | POA: Diagnosis not present

## 2021-04-02 DIAGNOSIS — R1031 Right lower quadrant pain: Secondary | ICD-10-CM | POA: Insufficient documentation

## 2021-04-02 DIAGNOSIS — E860 Dehydration: Secondary | ICD-10-CM | POA: Diagnosis not present

## 2021-04-02 LAB — URINALYSIS, COMPLETE (UACMP) WITH MICROSCOPIC
Bacteria, UA: NONE SEEN
Bilirubin Urine: NEGATIVE
Glucose, UA: NEGATIVE mg/dL
Ketones, ur: NEGATIVE mg/dL
Leukocytes,Ua: NEGATIVE
Nitrite: NEGATIVE
Protein, ur: 30 mg/dL — AB
Specific Gravity, Urine: 1.029 (ref 1.005–1.030)
pH: 5 (ref 5.0–8.0)

## 2021-04-02 LAB — CBC
HCT: 39.8 % (ref 36.0–46.0)
Hemoglobin: 13.9 g/dL (ref 12.0–15.0)
MCH: 32 pg (ref 26.0–34.0)
MCHC: 34.9 g/dL (ref 30.0–36.0)
MCV: 91.7 fL (ref 80.0–100.0)
Platelets: 210 10*3/uL (ref 150–400)
RBC: 4.34 MIL/uL (ref 3.87–5.11)
RDW: 11.8 % (ref 11.5–15.5)
WBC: 7 10*3/uL (ref 4.0–10.5)
nRBC: 0 % (ref 0.0–0.2)

## 2021-04-02 LAB — BASIC METABOLIC PANEL
Anion gap: 5 (ref 5–15)
BUN: 22 mg/dL — ABNORMAL HIGH (ref 6–20)
CO2: 23 mmol/L (ref 22–32)
Calcium: 9 mg/dL (ref 8.9–10.3)
Chloride: 108 mmol/L (ref 98–111)
Creatinine, Ser: 0.85 mg/dL (ref 0.44–1.00)
GFR, Estimated: >60 mL/min (ref >60)
Glucose, Bld: 93 mg/dL (ref 70–99)
Potassium: 4.1 mmol/L (ref 3.5–5.1)
Sodium: 136 mmol/L (ref 135–145)

## 2021-04-02 LAB — HEPATIC FUNCTION PANEL
ALT: 13 U/L (ref 0–44)
AST: 17 U/L (ref 15–41)
Albumin: 3.7 g/dL (ref 3.5–5.0)
Alkaline Phosphatase: 50 U/L (ref 38–126)
Bilirubin, Direct: <0.1 mg/dL (ref 0.0–0.2)
Total Bilirubin: 0.5 mg/dL (ref 0.3–1.2)
Total Protein: 6.4 g/dL — ABNORMAL LOW (ref 6.5–8.1)

## 2021-04-02 LAB — URINE CULTURE: Culture: 70000 — AB

## 2021-04-02 LAB — LIPASE, BLOOD: Lipase: 53 U/L — ABNORMAL HIGH (ref 11–51)

## 2021-04-02 MED ORDER — MORPHINE SULFATE (PF) 4 MG/ML IV SOLN
6.0000 mg | Freq: Once | INTRAVENOUS | Status: AC
  Administered 2021-04-02: 6 mg via INTRAVENOUS
  Filled 2021-04-02: qty 2

## 2021-04-02 MED ORDER — KETOROLAC TROMETHAMINE 30 MG/ML IJ SOLN
15.0000 mg | Freq: Once | INTRAMUSCULAR | Status: AC
  Administered 2021-04-02: 15 mg via INTRAVENOUS
  Filled 2021-04-02: qty 1

## 2021-04-02 MED ORDER — ONDANSETRON HCL 4 MG/2ML IJ SOLN
4.0000 mg | Freq: Once | INTRAMUSCULAR | Status: AC
  Administered 2021-04-02: 4 mg via INTRAVENOUS
  Filled 2021-04-02: qty 2

## 2021-04-02 MED ORDER — LACTATED RINGERS IV BOLUS
1000.0000 mL | Freq: Once | INTRAVENOUS | Status: AC
  Administered 2021-04-02: 1000 mL via INTRAVENOUS

## 2021-04-02 MED ORDER — HYDROCODONE-ACETAMINOPHEN 5-325 MG PO TABS: 2.0000 | ORAL_TABLET | Freq: Four times a day (QID) | ORAL | 0 refills | Status: DC | PRN

## 2021-04-02 MED ORDER — ONDANSETRON HCL 4 MG PO TABS: 4.0000 mg | ORAL_TABLET | Freq: Three times a day (TID) | ORAL | 0 refills | Status: DC | PRN

## 2021-04-02 NOTE — ED Provider Notes (Signed)
[facility] Emergency Department Provider Note  ____________________________________________   Event Date/Time   First MD Initiated Contact with Patient 04/02/21 1238     (approximate)  I have reviewed the triage vital signs and the nursing notes.   HISTORY  Chief Complaint Flank Pain   HPI Casey Hodges is a 45 y.o. female with a past medical history of kidney stones and previous ureteral stent placement as well as PTSD, ADHD, anxiety, bipolar affective disorder, fibromyalgia, GERD and recent diagnosis of UTI on 10/6 discharged on ciprofloxacin with culture growing E. coli sensitive to ciprofloxacin who presents to the emergency room for assessment of persistent symptoms.  Specifically she states she has had some persistent pain mostly in the right lower back radiating on the right flank and some burning with urination and difficulty with urinary stream.  She denies any abnormal vaginal bleeding or discharge, diarrhea or vomiting but does endorse some nausea.  She denies any left lower back pain, left abdominal pain, chest pain, cough, shortness of breath, headache, earache, sore throat, fevers, rash or extremity pain.  No recent falls or injuries.  She states she was also prescribed ketorolac which she took very early this morning which he does not feel is helped much.  No other acute concerns at this time.         Past Medical History:  Diagnosis Date   Abnormal vaginal Pap smear    10+ years ago- no colpo repeat was normal   ADHD    Anxiety    AR (allergic rhinitis)    Bipolar affective (HCC)    pt reported   Bipolar disorder (HCC)    Bursitis of both hips    Calcium, deposits, in bursa    left hip   Eating disorder    Under control per patient   Fibromyalgia    GERD (gastroesophageal reflux disease)    Kidney stone    Painful intercourse    Painful menstrual periods    Pelvic pain in female    PTSD (post-traumatic stress disorder)    Renal  disorder    Spinal stenosis    Uterine polyp    VWD (acquired von Willebrand's disease)     Patient Active Problem List   Diagnosis Date Noted   Substance induced mood disorder (HCC) 08/08/2020   Orthostasis 08/24/2016   Antibiotic-induced yeast infection 08/22/2016   Exercise-induced tachycardia 08/22/2016   Severe episode of recurrent major depressive disorder, without psychotic features (HCC)    GERD (gastroesophageal reflux disease) 09/16/2015   Smoker 07/25/2015   Snoring 06/09/2015   Cardiac murmur 05/25/2015   Drug-induced nausea and vomiting 03/22/2015   Cyst, bone 02/14/2015   Pelvic adhesive disease 01/21/2015   Arthritis 12/19/2014   Anxiety disorder 12/13/2014   Postural dizziness 12/13/2014   Subcutaneous cyst 12/13/2014   Nephrolithiasis 05/23/2012   Chronic pelvic pain in female 05/23/2012   Fibromyalgia 05/23/2012   Bipolar disorder with depression (HCC) 06/05/2011    Past Surgical History:  Procedure Laterality Date   ABDOMINAL HYSTERECTOMY     CYSTOSCOPY WITH STENT PLACEMENT Left 04/19/2017   Procedure: CYSTOSCOPY WITH STENT PLACEMENT;  Surgeon: Crista Elliot, MD;  Location: [facility] ORS;  Service: Urology;  Laterality: Left;   HYSTEROSCOPY     removed polyps   LAPAROSCOPIC VAGINAL HYSTERECTOMY  2015   at [facility]   LAPAROSCOPY Left 01/10/2015   Procedure: LAPAROSCOPY OPERATIVE with biopsy, left oopherectomy;  Surgeon: Herold Harms, MD;  Location: [facility] OR[facility];  Service: Gynecology;  Laterality: Left;   LAPAROSCOPY ABDOMEN DIAGNOSTIC     OOPHORECTOMY Left    TUBAL LIGATION     URETEROSCOPY WITH HOLMIUM LASER LITHOTRIPSY Left 04/19/2017   Procedure: URETEROSCOPY WITH HOLMIUM LASER LITHOTRIPSY;  Surgeon: Crista Elliot, MD;  Location: [facility] OR[facility];  Service: Urology;  Laterality: Left;    Prior to Admission medications   Medication Sig Start Date End Date Taking? Authorizing Provider  HYDROcodone-acetaminophen (NORCO) 5-325 MG tablet Take 2  tablets by mouth every 6 (six) hours as needed for moderate pain. 04/02/21  Yes Gilles Chiquito, MD  ondansetron (ZOFRAN) 4 MG tablet Take 1 tablet (4 mg total) by mouth every 8 (eight) hours as needed for up to 10 doses for nausea or vomiting. 04/02/21  Yes Gilles Chiquito, MD  ciprofloxacin (CIPRO) 500 MG tablet Take 1 tablet (500 mg total) by mouth 2 (two) times daily. 03/30/21   Rodriguez-Southworth, Nettie Elm, PA-C  clonazePAM (KLONOPIN) 1 MG tablet Take 1 mg by mouth 2 (two) times daily. 01/25/21   [provider]  diazepam (VALIUM) 5 MG tablet Take 5 mg by mouth every 6 (six) hours as needed for anxiety.    [provider]  ibuprofen (ADVIL) 600 MG tablet Take 1 tablet (600 mg total) by mouth every 6 (six) hours as needed for up to 14 days. 03/29/21 04/12/21  McDonald, Rachelle Hora, DPM  ketorolac (TORADOL) 10 MG tablet Take 1 tablet (10 mg total) by mouth every 6 (six) hours as needed. 03/30/21   Rodriguez-Southworth, Nettie Elm, PA-C  oxcarbazepine (TRILEPTAL) 600 MG tablet Take 600 mg by mouth 2 (two) times daily. 11/29/20   [provider]  phenazopyridine (PYRIDIUM) 200 MG tablet Take 1 tablet (200 mg total) by mouth 3 (three) times daily. 03/30/21   Rodriguez-Southworth, Nettie Elm, PA-C  tamsulosin (FLOMAX) 0.4 MG CAPS capsule Take 1 capsule (0.4 mg total) by mouth daily. 03/30/21   Rodriguez-Southworth, Nettie Elm, PA-C  VRAYLAR 4.5 MG CAPS Take 1 capsule by mouth daily. 11/29/20   [provider]  zolpidem (AMBIEN) 10 MG tablet Take 10 mg by mouth at bedtime. 08/01/20   [provider]    Allergies Wellbutrin [bupropion hcl] and Lasix [furosemide]  Family History  Problem Relation Age of Onset   Hypertension Father    Kidney Stones Father    Sleep apnea Mother    Hypertension Mother    Kidney Stones Mother    Lymphoma Mother    Breast cancer Paternal Grandmother    Diabetes Maternal Grandmother    Diabetes Maternal Aunt    Diabetes Maternal Uncle     Social  History Social History   Tobacco Use   Smoking status: Every Day    Packs/day: 1.00    Types: Cigarettes    Start date: 05/18/1995   Smokeless tobacco: Never   Tobacco comments:    smoking cessation information provided in AVS  Vaping Use   Vaping Use: Some days   Substances: Nicotine  Substance Use Topics   Alcohol use: No    Alcohol/week: 0.0 standard drinks    Comment: Socially- seldom   Drug use: Yes    Types: Marijuana    Comment: daily    Review of Systems  Review of Systems  Constitutional:  Negative for chills and fever.  HENT:  Negative for sore throat.   Eyes:  Negative for pain.  Respiratory:  Negative for cough and stridor.   Cardiovascular:  Negative for chest  pain.  Gastrointestinal:  Negative for vomiting.  Genitourinary:  Positive for dysuria.  Musculoskeletal:  Positive for back pain.  Skin:  Negative for rash.  Neurological:  Negative for seizures, loss of consciousness and headaches.  Psychiatric/Behavioral:  Negative for suicidal ideas.   All other systems reviewed and are negative.    ____________________________________________   PHYSICAL EXAM:  VITAL SIGNS: ED Triage Vitals  Enc Vitals Group     BP 04/02/21 1224 (!) 158/74     Pulse Rate 04/02/21 1224 98     Resp 04/02/21 1224 18     Temp 04/02/21 1224 98.5 F (36.9 C)     Temp Source 04/02/21 1224 Oral     SpO2 04/02/21 1224 99 %     Weight 04/02/21 1225 130 lb (59 kg)     Height 04/02/21 1225 5\' 2"  (1.575 m)     Head Circumference --      Peak Flow --      Pain Score 04/02/21 1224 8     Pain Loc --      Pain Edu? --      Excl. in GC? --    Vitals:   04/02/21 1224  BP: (!) 158/74  Pulse: 98  Resp: 18  Temp: 98.5 F (36.9 C)  SpO2: 99%   Physical Exam Vitals and nursing note reviewed.  Constitutional:      General: She is not in acute distress.    Appearance: She is well-developed.  HENT:     Head: Normocephalic and atraumatic.     Right Ear: External ear normal.      Left Ear: External ear normal.     Nose: Nose normal.  Eyes:     Conjunctiva/sclera: Conjunctivae normal.  Cardiovascular:     Rate and Rhythm: Normal rate and regular rhythm.     Heart sounds: No murmur heard. Pulmonary:     Effort: Pulmonary effort is normal. No respiratory distress.     Breath sounds: Normal breath sounds.  Abdominal:     Palpations: Abdomen is soft.     Tenderness: There is no abdominal tenderness. There is right CVA tenderness (R>L) and left CVA tenderness.  Musculoskeletal:     Cervical back: Neck supple.  Skin:    General: Skin is warm and dry.     Capillary Refill: Capillary refill takes 2 to 3 seconds.  Neurological:     Mental Status: She is alert and oriented to person, place, and time.  Psychiatric:        Mood and Affect: Mood normal.     ____________________________________________   LABS (all labs ordered are listed, but only abnormal results are displayed)  Labs Reviewed  URINALYSIS, COMPLETE (UACMP) WITH MICROSCOPIC - Abnormal; Notable for the following components:      Result Value   Color, Urine YELLOW (*)    APPearance HAZY (*)    Hgb urine dipstick SMALL (*)    Protein, ur 30 (*)    All other components within normal limits  BASIC METABOLIC PANEL - Abnormal; Notable for the following components:   BUN 22 (*)    All other components within normal limits  HEPATIC FUNCTION PANEL - Abnormal; Notable for the following components:   Total Protein 6.4 (*)    All other components within normal limits  LIPASE, BLOOD - Abnormal; Notable for the following components:   Lipase 53 (*)    All other components within normal limits  URINE CULTURE  CBC  ____________________________________________  EKG  ____________________________________________  RADIOLOGY  ED MD interpretation: CT abdomen pelvis shows punctate nonobstructing stone in the left without any visible stones in the right.  There is no evidence of hydronephrosis,  perinephric stranding, diverticulitis, pan ascites, pancreatitis, cholecystitis or other acute abdominal pelvic process.  Official radiology report(s): CT Renal Stone Study  Result Date: 04/02/2021 CLINICAL DATA:  Flank pain for 6 days EXAM: CT ABDOMEN AND PELVIS WITHOUT CONTRAST TECHNIQUE: Multidetector CT imaging of the abdomen and pelvis was performed following the standard protocol without IV contrast. COMPARISON:  None. FINDINGS: Lower chest: No acute abnormality. Hepatobiliary: No focal liver abnormality is seen. No gallstones, gallbladder wall thickening, or biliary dilatation. Pancreas: Unremarkable. No pancreatic ductal dilatation or surrounding inflammatory changes. Spleen: Normal in size without focal abnormality. Adrenals/Urinary Tract: Normal adrenal glands. 2 mm nonobstructing left renal calculus. No ureteral calculi. No obstructive uropathy. No renal mass. Normal bladder. Stomach/Bowel: Stomach is within normal limits. Appendix appears normal. No evidence of bowel wall thickening, distention, or inflammatory changes. Diverticulosis without evidence of diverticulitis. Moderate amount of stool in the ascending and transverse colon. Vascular/Lymphatic: Normal caliber abdominal aorta with mild atherosclerosis. No lymphadenopathy. Reproductive: Status post hysterectomy. No adnexal masses. Other: No abdominal wall hernia or abnormality. No abdominopelvic ascites. Musculoskeletal: No acute osseous abnormality. No aggressive osseous lesion. Osteoarthritis of bilateral SI joints. IMPRESSION: 1. Stable punctate nonobstructing left renal calculus. No hydronephrosis, ureteral calculus or bladder calculus. 2. Diverticulosis without evidence of diverticulitis. Electronically Signed   By: Elige Ko M.D.   On: 04/02/2021 13:44    ____________________________________________   PROCEDURES  Procedure(s) performed (including Critical  Care):  Procedures   ____________________________________________   INITIAL IMPRESSION / ASSESSMENT AND PLAN / ED COURSE        Patient presents with above-stated history exam for assessment of some persistent ongoing and may be slight worse right lower back pain rating down the right flank associate with burning with urination.  She states it also radiates to the left side of her back.  She has been compliant with her ciprofloxacin.  On arrival she is hypertensive with otherwise stable vital signs on room air.  She does have some bilateral CVA tenderness but much more significantly on the right.  She does appear very slightly dehydrated.  Differential includes pyelonephritis, kidney stone, cholecystitis, pancreatitis, and ongoing cystitis.  BMP with no significant electrolyte or metabolic derangements.  CBC without leukocytosis or acute anemia.  Hepatic function panel without evidence of hepatitis or cholestasis.  Lipase not consistent with acute pancreatitis.  UA with small hemoglobin and protein but no evidence of infection i.e. WBCs, nitrates or leukocyte esterase.   CT abdomen pelvis shows punctate nonobstructing stone in the left without any visible stones in the right.  There is no evidence of hydronephrosis, perinephric stranding, diverticulitis, pan ascites, pancreatitis, cholecystitis or other acute abdominal pelvic process.  Suspect possible some ongoing inflammation of the ureter bladder from possibly recently passed stone versus cystitis versus a small radiolucent stone.  Kidney function is appropriate and patient does not appear septic.  Urine does not appear infected and I advised patient it is reasonable to complete a course of antibiotics but he does not believe she requires changing her antibiotics or IV antibiotics at this time.  He states her pain is feeling better after below noted analgesia.  I think she is stable for discharge with outpatient neurology follow-up.  Rx  written for analgesia and Zofran.  Discharged stable condition.  ____________________________________________   FINAL CLINICAL IMPRESSION(S) / ED DIAGNOSES  Final diagnoses:  Flank pain  Dysuria    Medications  lactated ringers bolus 1,000 mL (1,000 mLs Intravenous New Bag/Given 04/02/21 1300)  morphine 4 MG/ML injection 6 mg (6 mg Intravenous Given 04/02/21 1303)  ondansetron (ZOFRAN) injection 4 mg (4 mg Intravenous Given 04/02/21 1302)  ketorolac (TORADOL) 30 MG/ML injection 15 mg (15 mg Intravenous Given 04/02/21 1348)     ED Discharge Orders          Ordered    HYDROcodone-acetaminophen (NORCO) 5-325 MG tablet  Every 6 hours PRN        04/02/21 1415    ondansetron (ZOFRAN) 4 MG tablet  Every 8 hours PRN        04/02/21 1415             Note:  This document was prepared using Dragon voice recognition software and may include unintentional dictation errors.    Gilles Chiquito, MD 04/02/21 [phone]

## 2021-04-02 NOTE — ED Notes (Signed)
See triage note  presents with bilateral flank pain  states sx's started about 6 days ago  currently being treated for UTI  thinks pain is worse .

## 2021-04-02 NOTE — ED Triage Notes (Signed)
Pt c/o BL flank pain for the past 6 days, was seen at urgent care 10/6 and dx with UTI with ecoli in urine. States he sx have worsened.

## 2021-04-03 LAB — URINE CULTURE: Culture: NO GROWTH

## 2021-04-03 NOTE — Progress Notes (Signed)
  Subjective:  Patient ID: LAIKYNN POLLIO, female    DOB: December 22, 1975,  MRN: [id number]  Chief Complaint  Patient presents with   Fracture    "Im still having a lot of pain, now its in my ankle, in the arch of my foot and it hurts to bend my toes.  I have been on my foot more than I should have been "    45 y.o. female presents with the above complaint. History confirmed with patient.  Says she has not been able to stay off of it much she is due struggling with crutches  Objective:  Physical Exam: warm, good capillary refill, no trophic changes or ulcerative lesions, normal DP and PT pulses, and normal sensory exam. Left Foot: Stabbing pain and tenderness over the lateral midfoot, hurts in the arch there is no ecchymosis  Radiographs: New radiographs taken today multiple views show unchanged alignment of fracture but still visible Assessment:   1. Closed displaced fracture of cuboid of left foot, initial encounter      Plan:  Patient was evaluated and treated and all questions answered.  I think she can begin to weight-bear in the cam boot comfortably.  Advised to continue to ice and elevate is much as possible.  Prescribed her ibuprofen 600 mg every 6 hours as needed for pain.  Plan to re x-ray in 1 month and hopeful transition to regular shoes  Return in about 4 weeks (around 04/26/2021) for fracture f/u left foot (new x-rays).

## 2021-04-06 ENCOUNTER — Ambulatory Visit (INDEPENDENT_AMBULATORY_CARE_PROVIDER_SITE_OTHER): Payer: Medicare Other | Admitting: Internal Medicine

## 2021-04-06 ENCOUNTER — Ambulatory Visit: Payer: Self-pay | Admitting: *Deleted

## 2021-04-06 ENCOUNTER — Other Ambulatory Visit: Payer: Self-pay

## 2021-04-06 VITALS — BP 118/72 | HR 99 | Temp 98.2°F | Resp 16 | Ht 65.0 in | Wt 142.3 lb

## 2021-04-06 DIAGNOSIS — N390 Urinary tract infection, site not specified: Secondary | ICD-10-CM

## 2021-04-06 DIAGNOSIS — H00011 Hordeolum externum right upper eyelid: Secondary | ICD-10-CM

## 2021-04-06 NOTE — Patient Instructions (Addendum)
It was great seeing you today!  Plan discussed at today's visit: -Treat with warm compresses multiple times a day, keep area clean and do not wear makeup.   Follow up in: as needed or if symptoms worsen or fail to improve  Take care and let us know if you have any questions or concerns prior to your next visit.  Dr. Hoyle Barr A stye, also known as a hordeolum, is a bump that forms on an eyelid. It may look like a pimple next to the eyelash. A stye can form inside the eyelid (internal stye) or outside the eyelid (external stye). A stye can cause redness, swelling, and pain on the eyelid. Styes are very common. Anyone can get them at any age. They usually occur in just one eye at a time, but you may have more than one in either eye. What are the causes? A stye is caused by an infection. The infection is almost always caused by bacteria called Staphylococcus aureus. This is a common type of bacteria that lives on the skin. An internal stye may result from an infected oil-producing gland inside the eyelid. An external stye may be caused by an infection at the base of the eyelash (hair follicle). What increases the risk? You are more likely to develop a stye if: You have had a stye before. You have any of these conditions: Red, itchy, inflamed eyelids (blepharitis). A skin condition such as seborrheic dermatitis or rosacea. High fat levels in your blood (lipids). Dry eyes. What are the signs or symptoms? The most common symptom of a stye is eyelid pain. Internal styes are more painful than external styes. Other symptoms may include: Painful swelling of your eyelid. A scratchy feeling in your eye. Tearing and redness of your eye. A pimple-like bump on the edge of the eyelid. Pus draining from the stye. How is this diagnosed? Your health care provider may be able to diagnose a stye just by examining your eye. The health care provider may also check to make sure: You do not have a  fever or other signs of a more serious infection. The infection has not spread to other parts of your eye or areas around your eye. How is this treated? Most styes will clear up in a few days without treatment or with warm compresses applied to the area. You may need to use antibiotic drops or ointment to treat an infection. Sometimes, steroid drops or ointment are used in addition to antibiotics. In some cases, your health care provider may give you a small steroid injection in the eyelid. If your stye does not heal with routine treatment, your health care provider may drain pus from the stye using a thin blade or needle. This may be done if the stye is large, causing a lot of pain, or affecting your vision. Follow these instructions at home: Take over-the-counter and prescription medicines only as told by your health care provider. This includes eye drops or ointments. If you were prescribed an antibiotic medicine, steroid medicine, or both, apply or use them as told by your health care provider. Do not stop using the medicine even if your condition improves. Apply a warm, wet cloth (warm compress) to your eye for 5-10 minutes, 4 to 6 times a day. Clean the affected eyelid as directed by your health care provider. Do not wear contact lenses or eye makeup until your stye has healed and your health care provider says that it is safe.  Do not try to pop or drain the stye. Do not rub your eye. Contact a health care provider if: You have chills or a fever. Your stye does not go away after several days. Your stye affects your vision. Your eyeball becomes swollen, red, or painful. Get help right away if: You have pain when moving your eye around. Summary A stye is a bump that forms on an eyelid. It may look like a pimple next to the eyelash. A stye can form inside the eyelid (internal stye) or outside the eyelid (external stye). A stye can cause redness, swelling, and pain on the eyelid. Your  health care provider may be able to diagnose a stye just by examining your eye. Apply a warm, wet cloth (warm compress) to your eye for 5-10 minutes, 4 to 6 times a day. This information is not intended to replace advice given to you by your health care provider. Make sure you discuss any questions you have with your health care provider. Document Revised: 08/17/2020 Document Reviewed: 08/17/2020 Elsevier Patient Education  2022 [employer].

## 2021-04-06 NOTE — Telephone Encounter (Signed)
Patient is calling to report she has swelling in R eyelid that has just about closed the eye. Slight discharge from the eye this morning. Patient states she can feel a knot at the brow

## 2021-04-06 NOTE — Telephone Encounter (Signed)
Reason for Disposition  MODERATE-SEVERE eyelid swelling on one side  (Exception: due to a mosquito bite)  Answer Assessment - Initial Assessment Questions 1. ONSET: "When did the swelling start?" (e.g., minutes, hours, days)     yesterday 2. LOCATION: "What part of the eyelids is swollen?"     R eye- top eyelid 3. SEVERITY: "How swollen is it?"     Swollen almost closed 4. ITCHING: "Is there any itching?" If Yes, ask: "How much?"   (Scale 1-10; mild, moderate or severe)     no 5. PAIN: "Is the swelling painful to touch?" If Yes, ask: "How painful is it?"   (Scale 1-10; mild, moderate or severe)     Discomfort from swelling- heavy- soe 6. FEVER: "Do you have a fever?" If Yes, ask: "What is it, how was it measured, and when did it start?"      no 7. CAUSE: "What do you think is causing the swelling?"     unsure 8. RECURRENT SYMPTOM: "Have you had eyelid swelling before?" If Yes, ask: "When was the last time?" "What happened that time?"     no 9. OTHER SYMPTOMS: "Do you have any other symptoms?" (e.g., blurred vision, eye discharge, rash, runny nose)     Discharge out of corner of eye 10. PREGNANCY: "Is there any chance you are pregnant?" "When was your last menstrual period?"       N/a  Protocols used: Eye - Swelling-A-AH

## 2021-04-06 NOTE — Progress Notes (Signed)
Acute Office Visit  Subjective:    Patient ID: Casey Hodges, female    DOB: [birth year], 45 y.o.   MRN: [id number]  Chief Complaint  Patient presents with   Eye Pain    With swelling itching and burning, right    HPI Patient is in today for EYE SWELLING. She states that she scraped her upper eyelid on Friday while doing her makeup and woke up yesterday with her right eye swollen shut. She denies pain of her eye or pain with EOM. She did note a crusting upon wakening but wiped it away before she could look at it. She denies fevers, changes in vision. Of note, she recently was seen in the ED for complicated UTI and finished course of Cipro this morning.   Duration:   yesterday Involved eye:  right Onset: sudden  Quality: burning and pressure-like Foreign body sensation:no Visual impairment: no Eye redness: yes Discharge: yes, clear drainage  Crusting or matting of eyelids: yes Swelling: yes Photophobia: no Itching: yes Tearing: no Headache: no Floaters: no URI symptoms: no Contact lens use: no Close contacts with similar problems: no Eye trauma: yes Aggravating factors: No Alleviating factors: No Status: worse Treatments attempted: None  Past Medical History:  Diagnosis Date   Abnormal vaginal Pap smear    10+ years ago- no colpo repeat was normal   ADHD    Anxiety    AR (allergic rhinitis)    Bipolar affective (HCC)    pt reported   Bipolar disorder (HCC)    Bursitis of both hips    Calcium, deposits, in bursa    left hip   Eating disorder    Under control per patient   Fibromyalgia    GERD (gastroesophageal reflux disease)    Kidney stone    Painful intercourse    Painful menstrual periods    Pelvic pain in female    PTSD (post-traumatic stress disorder)    Renal disorder    Spinal stenosis    Uterine polyp    VWD (acquired von Willebrand's disease)     Past Surgical History:  Procedure Laterality Date   ABDOMINAL HYSTERECTOMY     CYSTOSCOPY  WITH STENT PLACEMENT Left 04/19/2017   Procedure: CYSTOSCOPY WITH STENT PLACEMENT;  Surgeon: Crista Elliot, MD;  Location: [facility];  Service: Urology;  Laterality: Left;   HYSTEROSCOPY     removed polyps   LAPAROSCOPIC VAGINAL HYSTERECTOMY  2015   at [facility]   LAPAROSCOPY Left 01/10/2015   Procedure: LAPAROSCOPY OPERATIVE with biopsy, left oopherectomy;  Surgeon: Herold Harms, MD;  Location: [facility];  Service: Gynecology;  Laterality: Left;   LAPAROSCOPY ABDOMEN DIAGNOSTIC     OOPHORECTOMY Left    TUBAL LIGATION     URETEROSCOPY WITH HOLMIUM LASER LITHOTRIPSY Left 04/19/2017   Procedure: URETEROSCOPY WITH HOLMIUM LASER LITHOTRIPSY;  Surgeon: Crista Elliot, MD;  Location: [facility];  Service: Urology;  Laterality: Left;    Family History  Problem Relation Age of Onset   Hypertension Father    Kidney Stones Father    Sleep apnea Mother    Hypertension Mother    Kidney Stones Mother    Lymphoma Mother    Breast cancer Paternal Grandmother    Diabetes Maternal Grandmother    Diabetes Maternal Aunt    Diabetes Maternal Uncle     Social History   Socioeconomic History   Marital status: Widowed    Spouse name: Not on file  Number of children: 2   Years of education: Not on file   Highest education level: GED or equivalent  Occupational History   Occupation: Un-employed due to bipolar  Tobacco Use   Smoking status: Every Day    Packs/day: 1.00    Types: Cigarettes    Start date: 05/18/1995   Smokeless tobacco: Never   Tobacco comments:    smoking cessation information provided in AVS  Vaping Use   Vaping Use: Some days   Substances: Nicotine  Substance and Sexual Activity   Alcohol use: No    Alcohol/week: 0.0 standard drinks    Comment: Socially- seldom   Drug use: Yes    Types: Marijuana    Comment: daily   Sexual activity: Yes    Partners: Male    Birth control/protection: Surgical  Other Topics Concern   Not on file  Social History  Narrative   Regular Exercise -  NO   Daily Caffeine Use:  1 cup coffee in am      1 child, one step child      4 years ago she watched her husband die slowly before her - resulting in her having PTSD      Social Determinants of Health   Financial Resource Strain: Low Risk    Difficulty of Paying Living Expenses: Not very hard  Food Insecurity: No Food Insecurity   Worried About Programme researcher, broadcasting/film/video in the Last Year: Never true   Ran Out of Food in the Last Year: Never true  Transportation Needs: No Transportation Needs   Lack of Transportation (Medical): No   Lack of Transportation (Non-Medical): No  Physical Activity: Inactive   Days of Exercise per Week: 0 days   Minutes of Exercise per Session: 0 min  Stress: Stress Concern Present   Feeling of Stress : To some extent  Social Connections: Moderately Isolated   Frequency of Communication with Friends and Family: More than three times a week   Frequency of Social Gatherings with Friends and Family: More than three times a week   Attends Religious Services: More than 4 times per year   Active Member of [employer] or Organizations: No   Attends Banker Meetings: Never   Marital Status: Widowed  Intimate Partner Violence: Not on file    Outpatient Medications Prior to Visit  Medication Sig Dispense Refill   amphetamine-dextroamphetamine (ADDERALL XR) 10 MG 24 hr capsule Take 10 mg by mouth every morning.     clonazePAM (KLONOPIN) 1 MG tablet Take 1 mg by mouth 2 (two) times daily.     ibuprofen (ADVIL) 600 MG tablet Take 1 tablet (600 mg total) by mouth every 6 (six) hours as needed for up to 14 days. 56 tablet 0   oxcarbazepine (TRILEPTAL) 600 MG tablet Take 600 mg by mouth 2 (two) times daily.     REXULTI 1 MG TABS tablet Take 1 mg by mouth daily.     zolpidem (AMBIEN) 10 MG tablet Take 10 mg by mouth at bedtime.     ciprofloxacin (CIPRO) 500 MG tablet Take 1 tablet (500 mg total) by mouth 2 (two) times daily.  (Patient not taking: Reported on 04/06/2021) 14 tablet 0   diazepam (VALIUM) 5 MG tablet Take 5 mg by mouth every 6 (six) hours as needed for anxiety. (Patient not taking: Reported on 04/06/2021)     HYDROcodone-acetaminophen (NORCO) 5-325 MG tablet Take 2 tablets by mouth every 6 (six) hours as needed for moderate  pain. (Patient not taking: Reported on 04/06/2021) 6 tablet 0   ketorolac (TORADOL) 10 MG tablet Take 1 tablet (10 mg total) by mouth every 6 (six) hours as needed. (Patient not taking: Reported on 04/06/2021) 20 tablet 0   ondansetron (ZOFRAN) 4 MG tablet Take 1 tablet (4 mg total) by mouth every 8 (eight) hours as needed for up to 10 doses for nausea or vomiting. (Patient not taking: Reported on 04/06/2021) 10 tablet 0   phenazopyridine (PYRIDIUM) 200 MG tablet Take 1 tablet (200 mg total) by mouth 3 (three) times daily. (Patient not taking: Reported on 04/06/2021) 6 tablet 0   tamsulosin (FLOMAX) 0.4 MG CAPS capsule Take 1 capsule (0.4 mg total) by mouth daily. (Patient not taking: Reported on 04/06/2021) 5 capsule 0   VRAYLAR 4.5 MG CAPS Take 1 capsule by mouth daily. (Patient not taking: Reported on 04/06/2021)     No facility-administered medications prior to visit.    Allergies  Allergen Reactions   Wellbutrin [Bupropion Hcl] Other (See Comments)    Reaction:  Suicidal    Lasix [Furosemide] Rash    Review of Systems  Constitutional:  Negative for chills and fever.  HENT:  Negative for congestion, facial swelling, postnasal drip, rhinorrhea, sinus pressure and sinus pain.   Eyes:  Positive for discharge and redness. Negative for photophobia, pain, itching and visual disturbance.  Respiratory:  Negative for cough and shortness of breath.   Gastrointestinal:  Negative for abdominal pain, diarrhea, nausea and vomiting.  Genitourinary:  Negative for dysuria, flank pain, frequency, hematuria and urgency.  Skin:  Positive for color change.      Objective:    Physical  Exam Constitutional:      Appearance: Normal appearance.  HENT:     Head: Normocephalic and atraumatic.     Mouth/Throat:     Mouth: Mucous membranes are moist.     Pharynx: Oropharynx is clear.  Eyes:     General:        Right eye: No discharge.        Left eye: No discharge.     Extraocular Movements: Extraocular movements intact.     Conjunctiva/sclera: Conjunctivae normal.     Pupils: Pupils are equal, round, and reactive to light.     Comments: Large stye on superior aspect of right eyelid with surrounding swelling, no drainage present.   Cardiovascular:     Rate and Rhythm: Normal rate and regular rhythm.  Pulmonary:     Effort: Pulmonary effort is normal.     Breath sounds: Normal breath sounds.  Skin:    General: Skin is warm and dry.  Neurological:     General: No focal deficit present.     Mental Status: She is alert. Mental status is at baseline.  Psychiatric:        Mood and Affect: Mood normal.        Behavior: Behavior normal.    BP 118/72   Pulse 99   Temp 98.2 F (36.8 C)   Resp 16   Ht 5\' 5"  (1.651 m)   Wt 142 lb 4.8 oz (64.5 kg)   LMP 09/16/2015 Comment: neg preg test  SpO2 99%   BMI 23.68 kg/m  Wt Readings from Last 3 Encounters:  04/06/21 142 lb 4.8 oz (64.5 kg)  04/02/21 130 lb (59 kg)  02/27/21 143 lb 1.3 oz (64.9 kg)    Health Maintenance Due  Topic Date Due   COVID-19 Vaccine (1) Never done  INFLUENZA VACCINE  01/23/2021    There are no preventive care reminders to display for this patient.   Lab Results  Component Value Date   TSH 1.990 11/09/2016   Lab Results  Component Value Date   WBC 7.0 04/02/2021   HGB 13.9 04/02/2021   HCT 39.8 04/02/2021   MCV 91.7 04/02/2021   PLT 210 04/02/2021   Lab Results  Component Value Date   NA 136 04/02/2021   K 4.1 04/02/2021   CO2 23 04/02/2021   GLUCOSE 93 04/02/2021   BUN 22 (H) 04/02/2021   CREATININE 0.85 04/02/2021   BILITOT 0.5 04/02/2021   ALKPHOS 50 04/02/2021   AST  17 04/02/2021   ALT 13 04/02/2021   PROT 6.4 (L) 04/02/2021   ALBUMIN 3.7 04/02/2021   CALCIUM 9.0 04/02/2021   ANIONGAP 5 04/02/2021   Lab Results  Component Value Date   CHOL 166 03/06/2016   Lab Results  Component Value Date   HDL 37 (L) 03/06/2016   Lab Results  Component Value Date   LDLCALC 92 03/06/2016   Lab Results  Component Value Date   TRIG 187 (H) 03/06/2016   Lab Results  Component Value Date   CHOLHDL 4.5 03/06/2016   Lab Results  Component Value Date   HGBA1C 5.2 09/21/2015       Assessment & Plan:   1. Hordeolum externum of right upper eyelid: Discussed using warm compresses, keeping the area clean and not wearing makeup until stye resolves. Follow up as needed.   2. Complicated UTI (urinary tract infection): Resolved. Reviewed ED notes, patient now denies all urinary symptoms. She finished her antibiotic course this morning.    Margarita Mail, DO

## 2021-04-26 ENCOUNTER — Ambulatory Visit: Payer: Medicare Other | Admitting: Podiatry

## 2021-09-26 ENCOUNTER — Telehealth: Payer: Self-pay

## 2021-09-26 DIAGNOSIS — H00011 Hordeolum externum right upper eyelid: Secondary | ICD-10-CM

## 2021-09-26 NOTE — Telephone Encounter (Signed)
Copied from [place]ita [id number]. Topic: Referral - Request for Referral >> Sep 26, 2021  9:16 AM Yvette Rack wrote: Has patient seen PCP for this complaint? Yes.   *If NO, is insurance requiring patient see PCP for this issue before PCP can refer them? Referral for which specialty: ophthalmologist Preferred provider/office: [facility] Reason for referral: knot on eye lid - pt stated dermatologist suggested she get a referral to ophthalmologist

## 2021-09-26 NOTE — Telephone Encounter (Signed)
Pt had a visit with you related to this problem, would you out in the referral or should I have her schedule an appointment with Kristeen Miss? ?

## 2021-10-17 ENCOUNTER — Ambulatory Visit: Payer: Medicare Other

## 2021-10-19 ENCOUNTER — Ambulatory Visit (INDEPENDENT_AMBULATORY_CARE_PROVIDER_SITE_OTHER): Payer: Medicare Other

## 2021-10-19 DIAGNOSIS — Z0000 Encounter for general adult medical examination without abnormal findings: Secondary | ICD-10-CM

## 2021-10-19 NOTE — Patient Instructions (Signed)
Casey Hodges , ?Thank you for taking time to come for your Medicare Wellness Visit. I appreciate your ongoing commitment to your health goals. Please review the following plan we discussed and let me know if I can assist you in the future.  ? ?Screening recommendations/referrals: ?Colonoscopy: due age 45 ?Mammogram: done 10/26/20 ?Bone Density: done  ?Recommended yearly ophthalmology/optometry visit for glaucoma screening and checkup ?Recommended yearly dental visit for hygiene and checkup ? ?Vaccinations: ?Influenza vaccine: declined ?Pneumococcal vaccine: done 09/22/15 ?Tdap vaccine: done 07/02/17 ?Shingles vaccine: due age 30  ?Covid-19: declined ? ?Advanced directives: Please bring a copy of your health care power of attorney and living will to the office at your convenience.  ? ?Conditions/risks identified: Recommend increasing physical activity  ? ?Next appointment: Follow up in one year for your annual wellness visit.  ? ?Preventive Care 40-64 Years, Female ?Preventive care refers to lifestyle choices and visits with your health care provider that can promote health and wellness. ?What does preventive care include? ?A yearly physical exam. This is also called an annual well check. ?Dental exams once or twice a year. ?Routine eye exams. Ask your health care provider how often you should have your eyes checked. ?Personal lifestyle choices, including: ?Daily care of your teeth and gums. ?Regular physical activity. ?Eating a healthy diet. ?Avoiding tobacco and drug use. ?Limiting alcohol use. ?Practicing safe sex. ?Taking low-dose aspirin daily starting at age 73. ?Taking vitamin and mineral supplements as recommended by your health care provider. ?What happens during an annual well check? ?The services and screenings done by your health care provider during your annual well check will depend on your age, overall health, lifestyle risk factors, and family history of disease. ?Counseling  ?Your health care provider may  ask you questions about your: ?Alcohol use. ?Tobacco use. ?Drug use. ?Emotional well-being. ?Home and relationship well-being. ?Sexual activity. ?Eating habits. ?Work and work Statistician. ?Method of birth control. ?Menstrual cycle. ?Pregnancy history. ?Screening  ?You may have the following tests or measurements: ?Height, weight, and BMI. ?Blood pressure. ?Lipid and cholesterol levels. These may be checked every 5 years, or more frequently if you are over 102 years old. ?Skin check. ?Lung cancer screening. You may have this screening every year starting at age 63 if you have a 30-pack-year history of smoking and currently smoke or have quit within the past 15 years. ?Fecal occult blood test (FOBT) of the stool. You may have this test every year starting at age 56. ?Flexible sigmoidoscopy or colonoscopy. You may have a sigmoidoscopy every 5 years or a colonoscopy every 10 years starting at age 21. ?Hepatitis C blood test. ?Hepatitis B blood test. ?Sexually transmitted disease (STD) testing. ?Diabetes screening. This is done by checking your blood sugar (glucose) after you have not eaten for a while (fasting). You may have this done every 1-3 years. ?Mammogram. This may be done every 1-2 years. Talk to your health care provider about when you should start having regular mammograms. This may depend on whether you have a family history of breast cancer. ?BRCA-related cancer screening. This may be done if you have a family history of breast, ovarian, tubal, or peritoneal cancers. ?Pelvic exam and Pap test. This may be done every 3 years starting at age 8. Starting at age 21, this may be done every 5 years if you have a Pap test in combination with an HPV test. ?Bone density scan. This is done to screen for osteoporosis. You may have this scan if you are  at high risk for osteoporosis. ?Discuss your test results, treatment options, and if necessary, the need for more tests with your health care provider. ?Vaccines  ?Your  health care provider may recommend certain vaccines, such as: ?Influenza vaccine. This is recommended every year. ?Tetanus, diphtheria, and acellular pertussis (Tdap, Td) vaccine. You may need a Td booster every 10 years. ?Zoster vaccine. You may need this after age 102. ?Pneumococcal 13-valent conjugate (PCV13) vaccine. You may need this if you have certain conditions and were not previously vaccinated. ?Pneumococcal polysaccharide (PPSV23) vaccine. You may need one or two doses if you smoke cigarettes or if you have certain conditions. ?Talk to your health care provider about which screenings and vaccines you need and how often you need them. ?This information is not intended to replace advice given to you by your health care provider. Make sure you discuss any questions you have with your health care provider. ?Document Released: 07/08/2015 Document Revised: 02/29/2016 Document Reviewed: 04/12/2015 ?Elsevier Interactive Patient Education ? 2017 [employer] ? ? ? ?Fall Prevention in the Home ?Falls can cause injuries. They can happen to people of all ages. There are many things you can do to make your home safe and to help prevent falls. ?What can I do on the outside of my home? ?Regularly fix the edges of walkways and driveways and fix any cracks. ?Remove anything that might make you trip as you walk through a door, such as a raised step or threshold. ?Trim any bushes or trees on the path to your home. ?Use bright outdoor lighting. ?Clear any walking paths of anything that might make someone trip, such as rocks or tools. ?Regularly check to see if handrails are loose or broken. Make sure that both sides of any steps have handrails. ?Any raised decks and porches should have guardrails on the edges. ?Have any leaves, snow, or ice cleared regularly. ?Use sand or salt on walking paths during winter. ?Clean up any spills in your garage right away. This includes oil or grease spills. ?What can I do in the  bathroom? ?Use night lights. ?Install grab bars by the toilet and in the tub and shower. Do not use towel bars as grab bars. ?Use non-skid mats or decals in the tub or shower. ?If you need to sit down in the shower, use a plastic, non-slip stool. ?Keep the floor dry. Clean up any water that spills on the floor as soon as it happens. ?Remove soap buildup in the tub or shower regularly. ?Attach bath mats securely with double-sided non-slip rug tape. ?Do not have throw rugs and other things on the floor that can make you trip. ?What can I do in the bedroom? ?Use night lights. ?Make sure that you have a light by your bed that is easy to reach. ?Do not use any sheets or blankets that are too big for your bed. They should not hang down onto the floor. ?Have a firm chair that has side arms. You can use this for support while you get dressed. ?Do not have throw rugs and other things on the floor that can make you trip. ?What can I do in the kitchen? ?Clean up any spills right away. ?Avoid walking on wet floors. ?Keep items that you use a lot in easy-to-reach places. ?If you need to reach something above you, use a strong step stool that has a grab bar. ?Keep electrical cords out of the way. ?Do not use floor polish or wax that makes floors slippery.  If you must use wax, use non-skid floor wax. ?Do not have throw rugs and other things on the floor that can make you trip. ?What can I do with my stairs? ?Do not leave any items on the stairs. ?Make sure that there are handrails on both sides of the stairs and use them. Fix handrails that are broken or loose. Make sure that handrails are as long as the stairways. ?Check any carpeting to make sure that it is firmly attached to the stairs. Fix any carpet that is loose or worn. ?Avoid having throw rugs at the top or bottom of the stairs. If you do have throw rugs, attach them to the floor with carpet tape. ?Make sure that you have a light switch at the top of the stairs and the  bottom of the stairs. If you do not have them, ask someone to add them for you. ?What else can I do to help prevent falls? ?Wear shoes that: ?Do not have high heels. ?Have rubber bottoms. ?Are comfortable and fit yo

## 2021-10-19 NOTE — Progress Notes (Signed)
? ?Subjective:  ? Casey Hodges is a 46 y.o. female who presents for Medicare Annual (Subsequent) preventive examination. ? ?Virtual Visit via Telephone Note ? ?I connected with  Casey Hodges on 10/19/21 at  1:45 PM EDT by telephone and verified that I am speaking with the correct person using two identifiers. ? ?Location: ?Patient: home ?Provider: [facility] ?Persons participating in the virtual visit: patient/Nurse Health Advisor ?  ?I discussed the limitations, risks, security and privacy concerns of performing an evaluation and management service by telephone and the availability of in person appointments. The patient expressed understanding and agreed to proceed. ? ?Interactive audio and video telecommunications were attempted between this nurse and patient, however failed, due to patient having technical difficulties OR patient did not have access to video capability.  We continued and completed visit with audio only. ? ?Some vital signs may be absent or patient reported.  ? ?Reather Littler, LPN ? ? ?Review of Systems    ? ?Cardiac Risk Factors include: smoking/ tobacco exposure ? ?   ?Objective:  ?  ?Today's Vitals  ? 10/19/21 1354  ?PainSc: 6   ? ?There is no height or weight on file to calculate BMI. ? ? ?  04/02/2021  ? 12:26 PM 02/27/2021  ?  7:17 AM 10/13/2020  ?  2:45 PM 09/23/2019  ?  1:03 AM 08/29/2018  ?  4:22 PM 01/10/2018  ?  1:18 AM 07/02/2017  ?  8:39 PM  ?Advanced Directives  ?Does Patient Have a Medical Advance Directive? No No No No No No No  ?Would patient like information on creating a medical advance directive?  No - Patient declined Yes (MAU/Ambulatory/Procedural Areas - Information given) No - Patient declined No - Patient declined    ? ? ?Current Medications (verified) ?Outpatient Encounter Medications as of 10/19/2021  ?Medication Sig  ? amphetamine-dextroamphetamine (ADDERALL XR) 10 MG 24 hr capsule Take 10 mg by mouth every morning.  ? clonazePAM (KLONOPIN) 1 MG tablet Take 1 mg by mouth 2 (two)  times daily.  ? oxcarbazepine (TRILEPTAL) 600 MG tablet Take 600 mg by mouth 2 (two) times daily.  ? REXULTI 3 MG TABS Take 1 tablet by mouth daily.  ? zolpidem (AMBIEN) 10 MG tablet Take 10 mg by mouth at bedtime.  ? ondansetron (ZOFRAN) 4 MG tablet Take 1 tablet (4 mg total) by mouth every 8 (eight) hours as needed for up to 10 doses for nausea or vomiting. (Patient not taking: Reported on 04/06/2021)  ? [DISCONTINUED] ciprofloxacin (CIPRO) 500 MG tablet Take 1 tablet (500 mg total) by mouth 2 (two) times daily. (Patient not taking: Reported on 04/06/2021)  ? [DISCONTINUED] diazepam (VALIUM) 5 MG tablet Take 5 mg by mouth every 6 (six) hours as needed for anxiety. (Patient not taking: Reported on 04/06/2021)  ? [DISCONTINUED] HYDROcodone-acetaminophen (NORCO) 5-325 MG tablet Take 2 tablets by mouth every 6 (six) hours as needed for moderate pain. (Patient not taking: Reported on 04/06/2021)  ? [DISCONTINUED] ketorolac (TORADOL) 10 MG tablet Take 1 tablet (10 mg total) by mouth every 6 (six) hours as needed. (Patient not taking: Reported on 04/06/2021)  ? [DISCONTINUED] phenazopyridine (PYRIDIUM) 200 MG tablet Take 1 tablet (200 mg total) by mouth 3 (three) times daily. (Patient not taking: Reported on 04/06/2021)  ? [DISCONTINUED] REXULTI 1 MG TABS tablet Take 1 mg by mouth daily.  ? [DISCONTINUED] tamsulosin (FLOMAX) 0.4 MG CAPS capsule Take 1 capsule (0.4 mg total) by mouth daily. (Patient not taking: Reported  on 04/06/2021)  ? [DISCONTINUED] VRAYLAR 4.5 MG CAPS Take 1 capsule by mouth daily. (Patient not taking: Reported on 04/06/2021)  ? ?No facility-administered encounter medications on file as of 10/19/2021.  ? ? ?Allergies (verified) ?Wellbutrin [bupropion hcl] and Lasix [furosemide]  ? ?History: ?Past Medical History:  ?Diagnosis Date  ? Abnormal vaginal Pap smear   ? 10+ years ago- no colpo repeat was normal  ? ADHD   ? Anxiety   ? AR (allergic rhinitis)   ? Bipolar affective (HCC)   ? pt reported  ?  Bipolar disorder (HCC)   ? Bursitis of both hips   ? Calcium, deposits, in bursa   ? left hip  ? Eating disorder   ? Under control per patient  ? Fibromyalgia   ? GERD (gastroesophageal reflux disease)   ? Kidney stone   ? Painful intercourse   ? Painful menstrual periods   ? Pelvic pain in female   ? PTSD (post-traumatic stress disorder)   ? Renal disorder   ? Spinal stenosis   ? Uterine polyp   ? VWD (acquired von Willebrand's disease) (HCC)   ? ?Past Surgical History:  ?Procedure Laterality Date  ? ABDOMINAL HYSTERECTOMY    ? CYSTOSCOPY WITH STENT PLACEMENT Left 04/19/2017  ? Procedure: CYSTOSCOPY WITH STENT PLACEMENT;  Surgeon: Crista ElliotBell, Eugene D III, MD;  Location: [facility];  Service: Urology;  Laterality: Left;  ? HYSTEROSCOPY    ? removed polyps  ? LAPAROSCOPIC VAGINAL [facility]ST[facility]CT[facility]  [facility]  ? [facility]  ? LAPAROSCOPY Left 01/10/2015  ? Procedure: LAPAROSCOPY OPERATIVE with biopsy, left oopherectomy;  Surgeon: Herold HarmsMartin A Defrancesco, MD;  Location: [facility];  Service: Gynecology;  Laterality: Left;  ? LAPAROSCOPY ABDOMEN DIAGNOSTIC    ? OOPHORECTOMY Left   ? TUBAL LIGATION    ? URETEROSCOPY WITH HOLMIUM LASER LITHOTRIPSY Left 04/19/2017  ? Procedure: URETEROSCOPY WITH HOLMIUM LASER LITHOTRIPSY;  Surgeon: Crista ElliotBell, Eugene D III, MD;  Location: [facility];  Service: Urology;  Laterality: Left;  ? ?Family History  ?Problem Relation Age of Onset  ? Hypertension Father   ? Kidney Stones Father   ? Sleep apnea Mother   ? Hypertension Mother   ? Kidney Stones Mother   ? Lymphoma Mother   ? Breast cancer Paternal Grandmother   ? Diabetes Maternal Grandmother   ? Diabetes Maternal Aunt   ? Diabetes Maternal Uncle   ? ?Social History  ? ?Socioeconomic History  ? Marital status: Widowed  ?  Spouse name: Not on file  ? Number of children: 2  ? Years of education: Not on file  ? Highest education level: GED or equivalent  ?Occupational History  ? Occupation: Un-employed due to bipolar  ?Tobacco Use  ? Smoking status: Every Day  ?   Packs/day: 1.00  ?  Types: Cigarettes  ?  Start date: 05/18/1995  ? Smokeless tobacco: Never  ? Tobacco comments:  ?  smoking cessation information provided in AVS  ?Vaping Use  ? Vaping Use: Former  ? Substances: Nicotine  ?Substance and Sexual Activity  ? Alcohol use: No  ?  Alcohol/week: 0.0 standard drinks  ?  Comment: Socially- seldom  ? Drug use: Yes  ?  Types: Marijuana  ?  Comment: daily  ? Sexual activity: Yes  ?  Partners: Male  ?  Birth control/protection: Surgical  ?Other Topics Concern  ? Not on file  ?Social History Narrative  ? Regular Exercise -  NO  ? Daily Caffeine Use:  1 cup coffee in am  ?   ? 1 child, one step child  ?   ? 4 years ago she watched her husband die slowly before her - resulting in her having PTSD  ?   ?   ?   ? ?Social Determinants of Health  ? ?Financial Resource Strain: Low Risk   ? Difficulty of Paying Living Expenses: Not very hard  ?Food Insecurity: No Food Insecurity  ? Worried About Programme researcher, broadcasting/film/video in the Last Year: Never true  ? Ran Out of Food in the Last Year: Never true  ?Transportation Needs: No Transportation Needs  ? Lack of Transportation (Medical): No  ? Lack of Transportation (Non-Medical): No  ?Physical Activity: Inactive  ? Days of Exercise per Week: 0 days  ? Minutes of Exercise per Session: 0 min  ?Stress: Stress Concern Present  ? Feeling of Stress : To some extent  ?Social Connections: Moderately Isolated  ? Frequency of Communication with Friends and Family: More than three times a week  ? Frequency of Social Gatherings with Friends and Family: More than three times a week  ? Attends Religious Services: More than 4 times per year  ? Active Member of Clubs or Organizations: No  ? Attends Banker Meetings: Never  ? Marital Status: Widowed  ? ? ?Tobacco Counseling ?Ready to quit: Not Answered ?Counseling given: Not Answered ?Tobacco comments: smoking cessation information provided in AVS ? ? ?Clinical Intake: ? ?Pre-visit preparation  completed: Yes ? ?Pain : 0-10 ?Pain Score: 6  ?Pain Type: Chronic pain ?Pain Location: Neck (right shoulder) ?Pain Descriptors / Indicators: Aching, Sore ?Pain Onset: More than a month ago ?Pain Frequency: Constant ? ?

## 2021-10-25 ENCOUNTER — Ambulatory Visit: Payer: Medicare Other | Admitting: Internal Medicine

## 2021-10-29 ENCOUNTER — Emergency Department: Payer: Medicare Other

## 2021-10-29 ENCOUNTER — Emergency Department
Admission: EM | Admit: 2021-10-29 | Discharge: 2021-10-29 | Disposition: A | Discharge status: Home / Self Care | Payer: Medicare Other | Attending: Emergency Medicine | Admitting: Emergency Medicine

## 2021-10-29 ENCOUNTER — Other Ambulatory Visit: Payer: Self-pay

## 2021-10-29 DIAGNOSIS — S0181XA Laceration without foreign body of other part of head, initial encounter: Secondary | ICD-10-CM | POA: Diagnosis not present

## 2021-10-29 DIAGNOSIS — S0993XA Unspecified injury of face, initial encounter: Secondary | ICD-10-CM | POA: Diagnosis present

## 2021-10-29 DIAGNOSIS — S40011A Contusion of right shoulder, initial encounter: Secondary | ICD-10-CM | POA: Insufficient documentation

## 2021-10-29 DIAGNOSIS — W01198A Fall on same level from slipping, tripping and stumbling with subsequent striking against other object, initial encounter: Secondary | ICD-10-CM | POA: Insufficient documentation

## 2021-10-29 DIAGNOSIS — W19XXXA Unspecified fall, initial encounter: Secondary | ICD-10-CM

## 2021-10-29 MED ORDER — ACETAMINOPHEN 500 MG PO TABS
1000.0000 mg | ORAL_TABLET | Freq: Once | ORAL | Status: AC
  Administered 2021-10-29: 1000 mg via ORAL
  Filled 2021-10-29 (×2): qty 2

## 2021-10-29 MED ORDER — IBUPROFEN 600 MG PO TABS
600.0000 mg | ORAL_TABLET | Freq: Once | ORAL | Status: AC
Start: 2021-10-29 — End: 2021-10-29
  Administered 2021-10-29: 600 mg via ORAL
  Filled 2021-10-29: qty 1

## 2021-10-29 MED ORDER — TRAMADOL HCL 50 MG PO TABS
50.0000 mg | ORAL_TABLET | Freq: Once | ORAL | Status: AC
  Administered 2021-10-29: 50 mg via ORAL
  Filled 2021-10-29 (×2): qty 1

## 2021-10-29 MED ORDER — IBUPROFEN 800 MG PO TABS: 800.0000 mg | ORAL_TABLET | Freq: Three times a day (TID) | ORAL | 0 refills | Status: DC | PRN

## 2021-10-29 NOTE — ED Triage Notes (Signed)
Pt presents to ER c/o right side facial pain after tripping and falling, hitting under right eye.  Pt also states she has pain to top of right shoulder.  Some bleeding noted from abrasion under eye.  Pt states she has had 3 margaritas to drink tonight.  Pt noted to be unsteady on feet while walking to triage.  Unknown if any LOC.  Pt A&O x4 at this time in NAD.   ?

## 2021-10-29 NOTE — ED Provider Notes (Signed)
? ?[facility] ?Provider Note ? ? ? Event Date/Time  ? First MD Initiated Contact with Patient 10/29/21 0250   ?  (approximate) ? ? ?History  ? ?Fall ? ? ?HPI ? ?Casey Hodges is a 46 y.o. female with a history of PTSD, bipolar, anxiety who presents for evaluation of a fall.  Patient reports that she went out today and had some drinks.  When she come back home she tripped and fell.  She hit her face and her right shoulder.  No LOC.  She does not take any blood thinners.  She is complaining of severe right shoulder pain that is worse with any movement.  She reports hitting a wine rack made of metal.  She denies neck pain or back pain, chest pain or abdominal pain. ?  ? ? ?Past Medical History:  ?Diagnosis Date  ? Abnormal vaginal Pap smear   ? 10+ years ago- no colpo repeat was normal  ? ADHD   ? Anxiety   ? AR (allergic rhinitis)   ? Bipolar affective (HCC)   ? pt reported  ? Bipolar disorder (HCC)   ? Bursitis of both hips   ? Calcium, deposits, in bursa   ? left hip  ? Eating disorder   ? Under control per patient  ? Fibromyalgia   ? GERD (gastroesophageal reflux disease)   ? Kidney stone   ? Painful intercourse   ? Painful menstrual periods   ? Pelvic pain in female   ? PTSD (post-traumatic stress disorder)   ? Renal disorder   ? Spinal stenosis   ? Uterine polyp   ? VWD (acquired von Willebrand's disease) (HCC)   ? ? ?Past Surgical History:  ?Procedure Laterality Date  ? ABDOMINAL HYSTERECTOMY    ? CYSTOSCOPY WITH STENT PLACEMENT Left 04/19/2017  ? Procedure: CYSTOSCOPY WITH STENT PLACEMENT;  Surgeon: Crista Elliot, MD;  Location: [facility];  Service: Urology;  Laterality: Left;  ? HYSTEROSCOPY    ? removed polyps  ? LAPAROSCOPIC VAGINAL HYSTERECTOMY  2015  ? at [facility]  ? LAPAROSCOPY Left 01/10/2015  ? Procedure: LAPAROSCOPY OPERATIVE with biopsy, left oopherectomy;  Surgeon: Herold Harms, MD;  Location: [facility];  Service: Gynecology;  Laterality: Left;  ? LAPAROSCOPY  ABDOMEN DIAGNOSTIC    ? OOPHORECTOMY Left   ? TUBAL LIGATION    ? URETEROSCOPY WITH HOLMIUM LASER LITHOTRIPSY Left 04/19/2017  ? Procedure: URETEROSCOPY WITH HOLMIUM LASER LITHOTRIPSY;  Surgeon: Crista Elliot, MD;  Location: [facility];  Service: Urology;  Laterality: Left;  ? ? ? ?Physical Exam  ? ?Triage Vital Signs: ?ED Triage Vitals  ?Enc Vitals Group  ?   BP 10/29/21 0157 (!) 117/94  ?   Pulse Rate 10/29/21 0157 93  ?   Resp 10/29/21 0157 18  ?   Temp 10/29/21 0157 97.9 ?F (36.6 ?C)  ?   Temp Source 10/29/21 0157 Oral  ?   SpO2 10/29/21 0157 100 %  ?   Weight 10/29/21 0200 145 lb (65.8 kg)  ?   Height 10/29/21 0200 5\' 5"  (1.651 m)  ?   Head Circumference --   ?   Peak Flow --   ?   Pain Score 10/29/21 0200 8  ?   Pain Loc --   ?   Pain Edu? --   ?   Excl. in GC? --   ? ? ?Most recent vital signs: ?Vitals:  ? 10/29/21 0157  ?  BP: (!) 117/94  ?Pulse: 93  ?Resp: 18  ?Temp: 97.9 ?F (36.6 ?C)  ?SpO2: 100%  ? ? ?Full spinal precautions maintained throughout the trauma exam. ?Constitutional: Alert and oriented. No acute distress. Intoxicated. ?HEENT ?Head: Normocephalic and atraumatic. ?Face: No facial bony tenderness. Stable midface, there is a linear skin tear, shallow laceration under the R eye ?Ears: No hemotympanum bilaterally. No Battle sign ?Eyes: No eye injury. PERRL. No raccoon eyes ?Nose: Nontender. No epistaxis. No rhinorrhea ?Mouth/Throat: Mucous membranes are moist. No oropharyngeal blood. No dental injury. Airway patent without stridor. Normal voice. ?Neck: no C-collar. No midline c-spine tenderness.  ?Cardiovascular: Normal rate, regular rhythm. Normal and symmetric distal pulses are present in all extremities. ?Pulmonary/Chest: Chest wall is stable and nontender to palpation/compression. Normal respiratory effort. Breath sounds are normal. No crepitus.  ?Abdominal: Soft, nontender, non distended. ?Musculoskeletal: Diffuse tenderness of the right trapezius area at the base of the neck. nontender with  normal full range of motion in all extremities. No deformities. No thoracic or lumbar midline spinal tenderness. Pelvis is stable. ?Skin: Skin is warm, dry and intact. No abrasions or contutions. ?Psychiatric: Speech and behavior are appropriate. ?Neurological: Normal speech and language. Moves all extremities to command. No gross focal neurologic deficits are appreciated. ? ?Glascow Coma Score: ?4 - Opens eyes on own ?6 - Follows simple motor commands ?5 - Alert and oriented ?GCS: 15 ? ? ?ED Results / Procedures / Treatments  ? ?Labs ?(all labs ordered are listed, but only abnormal results are displayed) ?Labs Reviewed - No data to display ? ? ?EKG ? ?none ? ? ?RADIOLOGY ?I, Nita Sicklearolina Sybil Shrader, attending MD, have personally viewed and interpreted the images obtained during this visit as below: ? ?X-ray of the shoulder is negative ? ?CT head, C-spine, and face negative for traumatic injury ?___________________________________________________ ?Interpretation by Radiologist:  ?DG Shoulder Right ? ?Result Date: 10/29/2021 ?CLINICAL DATA:  Right shoulder pain EXAM: RIGHT SHOULDER - 2+ VIEW COMPARISON:  None Available. FINDINGS: There is no evidence of fracture or dislocation. There is no evidence of arthropathy or other focal bone abnormality. Soft tissues are unremarkable. IMPRESSION: Negative. Electronically Signed   By: Deatra RobinsonKevin  Herman M.D.   On: 10/29/2021 02:39  ? ?CT Head Wo Contrast ? ?Result Date: 10/29/2021 ?CLINICAL DATA:  Fall. EXAM: CT HEAD WITHOUT CONTRAST CT MAXILLOFACIAL WITHOUT CONTRAST CT CERVICAL SPINE WITHOUT CONTRAST TECHNIQUE: Multidetector CT imaging of the head, cervical spine, and maxillofacial structures were performed using the standard protocol without intravenous contrast. Multiplanar CT image reconstructions of the cervical spine and maxillofacial structures were also generated. RADIATION DOSE REDUCTION: This exam was performed according to the departmental dose-optimization program which includes  automated exposure control, adjustment of the mA and/or kV according to patient size and/or use of iterative reconstruction technique. COMPARISON:  CT dated 09/16/2015. FINDINGS: CT HEAD FINDINGS Brain: The ventricles and sulci appropriate size for patient's age. The gray-white matter discrimination is preserved. Subcentimeter hypodense focus inferior to the left lentiform nucleus similar to prior CT, likely a dilated prevascular space. A small old lacunar infarct is less likely. There is no acute intracranial hemorrhage. No mass effect or midline shift. No extra-axial fluid collection. Vascular: No hyperdense vessel or unexpected calcification. Skull: Normal. Negative for fracture or focal lesion. Other: None CT MAXILLOFACIAL FINDINGS Osseous: No acute fracture.  No mandibular subluxation. Orbits: The globes and retro-orbital fat are preserved. Sinuses: Clear. Soft tissues: Negative. CT CERVICAL SPINE FINDINGS Alignment: No acute subluxation. There is straightening of normal cervical  lordosis which may be positional or due to muscle spasm. Skull base and vertebrae: No acute fracture. Soft tissues and spinal canal: No prevertebral fluid or swelling. No visible canal hematoma. Disc levels:  Mild degenerative changes at C5-C6. Upper chest: Negative. Other: None IMPRESSION: 1. No acute intracranial pathology. 2. No acute facial bone fractures. 3. No acute cervical spine fracture or subluxation. Electronically Signed   By: Elgie Collard M.D.   On: 10/29/2021 02:42  ? ?CT Cervical Spine Wo Contrast ? ?Result Date: 10/29/2021 ?CLINICAL DATA:  Fall. EXAM: CT HEAD WITHOUT CONTRAST CT MAXILLOFACIAL WITHOUT CONTRAST CT CERVICAL SPINE WITHOUT CONTRAST TECHNIQUE: Multidetector CT imaging of the head, cervical spine, and maxillofacial structures were performed using the standard protocol without intravenous contrast. Multiplanar CT image reconstructions of the cervical spine and maxillofacial structures were also generated.  RADIATION DOSE REDUCTION: This exam was performed according to the departmental dose-optimization program which includes automated exposure control, adjustment of the mA and/or kV according to patient size an

## 2021-10-29 NOTE — ED Notes (Signed)
Pt refused discharge vital signs at this time. Pt told she could wait here until her ride came.Pt is now gone after receiving discharge summary  ? ? ?

## 2021-10-29 NOTE — ED Notes (Signed)
Pt yelling and cussing in hallway about wait and tylenol. Pt states tylenol "does nothing for me". Pt informed the doctor is in with a critically ill pt and to attempt to be patient. Pt states she needs to smoke and she will come back in. Pt ambulatory outside to smoke with steady gait.  ?

## 2021-10-29 NOTE — ED Notes (Signed)
Pt is in hallway saying that she needs something for her pain. This RN stops her in the hallway with pain medication in hand. Pt states tylenol does not work for her and that she is going to leave. Pt is yelling and walking out of the ER at this time with a steady gait. ?

## 2021-10-30 ENCOUNTER — Ambulatory Visit: Payer: Self-pay

## 2021-10-30 ENCOUNTER — Telehealth: Payer: Self-pay

## 2021-10-30 NOTE — Telephone Encounter (Signed)
?  Chief Complaint: Pain - cannot get pain medications prescribed ?Symptoms: pain ?Frequency: since last night ?Pertinent Negatives: Patient denies  ?Disposition: [] ED /[] Urgent Care (no appt availability in office) / [] Appointment(In office/virtual)/ []  [facility]/ [] Home Care/ [] Refused Recommended Disposition /[] [facility]/ [x]  Follow-up with PCP ?Additional Notes: Pt was seen at ED and prescribed oxycodone for pain management. Pt is unable to get medication as her MAR shows she is taking other medications. Pt states that she is not longer taking Adderall, Zofran, Ambien and Oxcarbazepine.  And would like to have those medications removed from her [facility] so she can get the pain medication. ?Appointment scheduled for tomorrow for hospital follow up and ortho referral. ?Pt would like a call back when [facility] is updated and she can get pain medication filled. ?Reason for Disposition ? Caller requesting a CONTROLLED substance prescription refill (e.g., narcotics, ADHD medicines) ? ?Answer Assessment - Initial Assessment Questions ?1. DRUG NAME: "What medicine do you need to have refilled?" ?    Oxycodone ?2. REFILLS REMAINING: "How many refills are remaining?" (Note: The label on the medicine or pill bottle will show how many refills are remaining. If there are no refills remaining, then a renewal may be needed.) ?     ?3. EXPIRATION DATE: "What is the expiration date?" (Note: The label states when the prescription will expire, and thus can no longer be refilled.) ?     ?4. PRESCRIBING HCP: "Who prescribed it?" Reason: If prescribed by specialist, call should be referred to that group. ?    ED ?5. SYMPTOMS: "Do you have any symptoms?" ?    pain ?6. PREGNANCY: "Is there any chance that you are pregnant?" "When was your last menstrual period?" ?    na ? ?Protocols used: Medication Refill and Renewal Call-A-AH ? ?

## 2021-10-30 NOTE — Telephone Encounter (Signed)
FYI

## 2021-10-30 NOTE — Telephone Encounter (Signed)
These meds have been discontinued by patient preference, will see her tomorrow for appt.  Dr. Caralee Ates does not prescribe controlled meds ?

## 2021-10-30 NOTE — Telephone Encounter (Signed)
Can we refer w/ out seeing or should I tell her just to go to walk in clinic? ?

## 2021-10-30 NOTE — Telephone Encounter (Signed)
Copied from CRM [id number]. Topic: Referral - Request for Referral >> Oct 30, 2021  9:00 AM Casey Hodges wrote: Has patient seen PCP for this complaint? No. Pt was seen in ED and advised she will need to follow up with ortho *If NO, is insurance requiring patient see PCP for this issue before PCP can refer them? Referral for which specialty: ortho Preferred provider/office: Ortho provider in [facility] Reason for referral: fracture between shoulder and color bone

## 2021-10-31 ENCOUNTER — Other Ambulatory Visit: Payer: Self-pay

## 2021-10-31 ENCOUNTER — Ambulatory Visit (INDEPENDENT_AMBULATORY_CARE_PROVIDER_SITE_OTHER): Payer: Medicare Other | Admitting: Nurse Practitioner

## 2021-10-31 ENCOUNTER — Encounter: Payer: Self-pay | Admitting: Nurse Practitioner

## 2021-10-31 VITALS — BP 120/80 | HR 98 | Temp 98.6°F | Resp 18 | Ht 65.0 in | Wt 141.5 lb

## 2021-10-31 DIAGNOSIS — S01411D Laceration without foreign body of right cheek and temporomandibular area, subsequent encounter: Secondary | ICD-10-CM | POA: Diagnosis not present

## 2021-10-31 DIAGNOSIS — W19XXXD Unspecified fall, subsequent encounter: Secondary | ICD-10-CM

## 2021-10-31 DIAGNOSIS — M25511 Pain in right shoulder: Secondary | ICD-10-CM | POA: Diagnosis not present

## 2021-10-31 NOTE — Progress Notes (Signed)
? ?BP 120/80   Pulse 98   Temp 98.6 ?F (37 ?C) (Oral)   Resp 18   Ht 5\' 5"  (1.651 m)   Wt 141 lb 8 oz (64.2 kg)   LMP 09/16/2015 Comment: neg preg test  SpO2 98%   BMI 23.55 kg/m?   ? ?Subjective:  ? ? Patient ID: Casey Hodges, female    DOB: 03/29/1976, 46 y.o.   MRN: [id number] ? ?HPI: ?Casey CadetKena C Larkin is a 46 y.o. female ? ?Chief Complaint  ?Patient presents with  ? Fall  ?  On 10/28/21  ? Shoulder Injury  ?  [facility] shoulder fracture  ? Eye Injury  ?  Swelling under right eye 2 stitches  ? ?Follow-up/fall/laceration/right shoulder pain: Patient had a ground-level fall on 10/28/2021.  She says she woke up in the middle of the night to go the bathroom tripped over her foot and fell hitting her face on a metal wine rack.  She denies any LOC.  [facility] first [facility] to [facility].  She said that her face was not cleaned up or addressed.  She said that they told her that her shoulder was just bruised.  He was discharged home.  She says she woke up the next day still in pain decided to go to [facility].  There she had CTs and x-rays done and laceration repair.  Patient did have a laceration under her right eye that was repaired.  Incision looks good at this time well approximation there is still some swelling and bruising. Discussed with patient about applying ice.  Patient had CT head, maxillofacial, C-spine all were negative.  She had a couple right shoulder x-rays done which showed Linear lucency through the base of the coracoid process, seen only on the axillary view, may represent a nondisplaced fracture. Scapular Y view may be helpful for further evaluation.  Patient states she was given oxycodone for pain.  Patient states she is all out.  Patient requesting a refill.  Discussed with patient it would be better for her to take her ibuprofen and alternate with Tylenol.  Discussed with patient applying ice to areas affected.  Placed a referral to orthopedics. ? ?Relevant past medical, surgical, family and  social history reviewed and updated as indicated. Interim medical history since our last visit reviewed. ?Allergies and medications reviewed and updated. ? ?Review of Systems ? ?Constitutional: Negative for fever or weight change.  ?Face: Positive swelling to right cheek ?Respiratory: Negative for cough and shortness of breath.   ?Cardiovascular: Negative for chest pain or palpitations.  ?Gastrointestinal: Negative for abdominal pain, no bowel changes.  ?Musculoskeletal: Negative for gait problem or joint swelling.  Positive right shoulder pain ?Skin: Negative for rash.  ?Neurological: Negative for dizziness or headache.  ?No other specific complaints in a complete review of systems (except as listed in HPI above).  ? ?   ?Objective:  ?  ?BP 120/80   Pulse 98   Temp 98.6 ?F (37 ?C) (Oral)   Resp 18   Ht 5\' 5"  (1.651 m)   Wt 141 lb 8 oz (64.2 kg)   LMP 09/16/2015 Comment: neg preg test  SpO2 98%   BMI 23.55 kg/m?   ?Wt Readings from Last 3 Encounters:  ?10/31/21 141 lb 8 oz (64.2 kg)  ?10/29/21 145 lb (65.8 kg)  ?04/06/21 142 lb 4.8 oz (64.5 kg)  ?  ?Physical Exam ? ?Constitutional: Patient appears well-developed and well-nourished.  No distress.  ?HEENT:  head atraumatic, normocephalic, pupils equal and reactive to light, small laceration to right cheek, well approximated, no redness, no signs of infection.  Neck supple ?Cardiovascular: Normal rate, regular rhythm and normal heart sounds.  No murmur heard. No BLE edema. ?Pulmonary/Chest: Effort normal and breath sounds normal. No respiratory distress. ?Abdominal: Soft.  There is no tenderness. ?MSK: Decreased range of motion in right shoulder, good PMS ?Psychiatric: Patient has a normal mood and affect. behavior is normal. Judgment and thought content normal.  ?Results for orders placed or performed during the hospital encounter of 04/02/21  ?Urine Culture  ? Specimen: Urine, Clean Catch  ?Result Value Ref Range  ? Specimen Description    ?  URINE, CLEAN  CATCH ?Performed at [facility], [address], [place], [place] [address] ?  ? Special Requests    ?  NONE ?Performed at [facility], [address] [place], [place] [address] ?  ? Culture    ?  NO GROWTH ?Performed at [facility], [address], [place], [place] [address] ?  ? Report Status 04/03/2021 FINAL   ?Urinalysis, Complete w Microscopic Urine, Clean Catch  ?Result Value Ref Range  ? Color, Urine YELLOW (A) YELLOW  ? APPearance HAZY (A) CLEAR  ? Specific Gravity, Urine 1.029 1.005 - 1.030  ? pH 5.0 5.0 - 8.0  ? Glucose, UA NEGATIVE NEGATIVE mg/dL  ? Hgb urine dipstick SMALL (A) NEGATIVE  ? Bilirubin Urine NEGATIVE NEGATIVE  ? Ketones, ur NEGATIVE NEGATIVE mg/dL  ? Protein, ur 30 (A) NEGATIVE mg/dL  ? Nitrite NEGATIVE NEGATIVE  ? Leukocytes,Ua NEGATIVE NEGATIVE  ? RBC / HPF 6-10 0 - 5 RBC/hpf  ? WBC, UA 0-5 0 - 5 WBC/hpf  ? Bacteria, UA NONE SEEN NONE SEEN  ? Squamous Epithelial / LPF 0-5 0 - 5  ? Mucus PRESENT   ?Basic metabolic panel  ?Result Value Ref Range  ? Sodium 136 135 - 145 mmol/L  ? Potassium 4.1 3.5 - 5.1 mmol/L  ? Chloride 108 98 - 111 mmol/L  ? CO2 23 22 - 32 mmol/L  ? Glucose, Bld 93 70 - 99 mg/dL  ? BUN 22 (H) 6 - 20 mg/dL  ? Creatinine, Ser 0.85 0.44 - 1.00 mg/dL  ? Calcium 9.0 8.9 - 10.3 mg/dL  ? GFR, Estimated >60 >60 mL/min  ? Anion gap 5 5 - 15  ?CBC  ?Result Value Ref Range  ? WBC 7.0 4.0 - 10.5 K/uL  ? RBC 4.34 3.87 - 5.11 MIL/uL  ? Hemoglobin 13.9 12.0 - 15.0 g/dL  ? HCT 39.8 36.0 - 46.0 %  ? MCV 91.7 80.0 - 100.0 fL  ? MCH 32.0 26.0 - 34.0 pg  ? MCHC 34.9 30.0 - 36.0 g/dL  ? RDW 11.8 11.5 - 15.5 %  ? Platelets 210 150 - 400 K/uL  ? nRBC 0.0 0.0 - 0.2 %  ?Hepatic function panel  ?Result Value Ref Range  ? Total Protein 6.4 (L) 6.5 - 8.1 g/dL  ? Albumin 3.7 3.5 - 5.0 g/dL  ? AST 17 15 - 41 U/L  ? ALT 13 0 - 44 U/L  ? Alkaline Phosphatase 50 38 - 126 U/L  ? Total Bilirubin 0.5 0.3 - 1.2 mg/dL  ? Bilirubin, Direct <0.1 0.0 - 0.2 mg/dL  ? Indirect Bilirubin  NOT CALCULATED 0.3 - 0.9 mg/dL  ?Lipase, blood  ?Result Value Ref Range  ? Lipase 53 (H) 11 - 51 U/L  ? ?   ?  Assessment & Plan:  ? ?Problem List Items Addressed This Visit   ?None ?Visit Diagnoses   ? ? Acute pain of right shoulder    -  Primary  ? Referral placed for orthopedics.  Patient to continue wearing sling.  Alternate  Tylenol and ibuprofen.  ? Relevant Orders  ? Ambulatory referral to Orthopedics  ? Fall, subsequent encounter      ? Laceration of right cheek without complication, subsequent encounter      ? Laceration was repaired in the ER, wound looks well approximated.  No signs of infection.  Discussed with patient to ice area.  Alternate Tylenol and ibuprofen  ? ?  ?  ? ?Follow up plan: ?Return if symptoms worsen or fail to improve. ? ? ? ? ? ?

## 2021-11-01 ENCOUNTER — Encounter: Payer: Self-pay | Admitting: Nurse Practitioner

## 2021-11-06 ENCOUNTER — Ambulatory Visit: Payer: Medicare Other | Admitting: Internal Medicine

## 2021-11-06 NOTE — Progress Notes (Deleted)
   Established Patient Office Visit  Subjective   Patient ID: Casey Hodges, female    DOB: 08-12-1975  Age: 46 y.o. MRN: [id number]  No chief complaint on file.   HPI Casey Hodges is a 46 year old female here for follow up on chronic medical conditions.   MDD: -Mood status: {Blank single:19197::"controlled","uncontrolled","better","worse","exacerbated","stable"} -Current treatment: Remeron 15, Vyvanse 30  -Satisfied with current treatment?: {Blank single:19197::"yes","no"} -Symptom severity: {Blank single:19197::"mild","moderate","severe"}  -Duration of current treatment : {Blank single:19197::"chronic","months","years"} -Side effects: {Blank single:19197::"yes","no"} Medication compliance: {Blank single:19197::"excellent compliance","good compliance","fair compliance","poor compliance"} Psychotherapy/counseling: {Blank single:19197::"yes","no"} {Blank single:19197::"current","in the past"} Previous psychiatric medications: {Blank multiple:19196::"abilify","amitryptiline","buspar","celexa","cymbalta","depakote","effexor","lamictal","lexapro","lithium","nortryptiline","paxil","prozac","pristiq (desvenlafaxine","seroquel","wellbutrin","zoloft","zyprexa"} Depressed mood: {Blank single:19197::"yes","no"} Anxious mood: {Blank single:19197::"yes","no"} Anhedonia: {Blank single:19197::"yes","no"} Significant weight loss or gain: {Blank single:19197::"yes","no"} Insomnia: {Blank single:19197::"yes","no"} {Blank single:19197::"hard to fall asleep","hard to stay asleep"} Fatigue: {Blank single:19197::"yes","no"} Feelings of worthlessness or guilt: {Blank single:19197::"yes","no"} Impaired concentration/indecisiveness: {Blank single:19197::"yes","no"} Suicidal ideations: {Blank single:19197::"yes","no"} Hopelessness: {Blank single:19197::"yes","no"} Crying spells: {Blank single:19197::"yes","no"}    10/31/2021    1:19 PM 10/19/2021    2:04 PM 04/06/2021   10:55 AM 11/15/2020    2:43 PM  10/13/2020    2:41 PM  Depression screen PHQ 2/9  Decreased Interest 1 2 2 3 1   Down, Depressed, Hopeless 1 3  3 1   PHQ - 2 Score 2 5 2 6 2   Altered sleeping 1 3 0 3 3  Tired, decreased energy 1 2 0 3 3  Change in appetite 0 2 0 2 2  Feeling bad or failure about yourself  0 3 0 3 0  Trouble concentrating 1 3 0 3 3  Moving slowly or fidgety/restless 0 3 0 2 3  Suicidal thoughts 0 3 0 0 1  PHQ-9 Score 5 24 2 22 17   Difficult doing work/chores Not difficult at all Very difficult Not difficult at all Very difficult Somewhat difficult    Health Maintenance: -Blood work due  -Mammogram due - last mammogram 10/26/20 with stable masses in the right breast at the 10 o'clock position, stable over last 2 years and now considered benign with a benign cluster of cyts in the right breast at the 6 o'clock position  -Colon cancer screening due   {History (Optional):23778}  ROS    Objective:     LMP 09/16/2015 Comment: neg preg test {Vitals History (Optional):23777}  Physical Exam   No results found for any visits on 11/06/21.  {Labs (Optional):23779}  The ASCVD Risk score (Arnett DK, et al., 2019) failed to calculate for the following reasons:   Cannot find a previous HDL lab   Cannot find a previous total cholesterol lab    Assessment & Plan:   Problem List Items Addressed This Visit   None   No follow-ups on file.    12/26/20, DO

## 2021-11-23 ENCOUNTER — Emergency Department
Admission: EM | Admit: 2021-11-23 | Discharge: 2021-11-23 | Discharge status: Against Medical Advice | Payer: Medicare Other

## 2021-12-28 ENCOUNTER — Other Ambulatory Visit: Payer: Self-pay | Admitting: Internal Medicine

## 2021-12-28 ENCOUNTER — Other Ambulatory Visit: Payer: Self-pay | Admitting: Certified Nurse Midwife

## 2021-12-28 DIAGNOSIS — Z1231 Encounter for screening mammogram for malignant neoplasm of breast: Secondary | ICD-10-CM

## 2022-01-15 ENCOUNTER — Encounter: Payer: Self-pay | Admitting: Nurse Practitioner

## 2022-01-15 ENCOUNTER — Telehealth (INDEPENDENT_AMBULATORY_CARE_PROVIDER_SITE_OTHER): Payer: Medicare Other | Admitting: Nurse Practitioner

## 2022-01-15 DIAGNOSIS — R0683 Snoring: Secondary | ICD-10-CM

## 2022-01-15 DIAGNOSIS — G8929 Other chronic pain: Secondary | ICD-10-CM | POA: Diagnosis not present

## 2022-01-15 DIAGNOSIS — R0689 Other abnormalities of breathing: Secondary | ICD-10-CM

## 2022-01-15 DIAGNOSIS — M25511 Pain in right shoulder: Secondary | ICD-10-CM

## 2022-01-15 MED ORDER — IBUPROFEN 800 MG PO TABS: 800.0000 mg | ORAL_TABLET | Freq: Three times a day (TID) | ORAL | 0 refills | Status: DC | PRN
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
## 2022-01-17 ENCOUNTER — Telehealth: Payer: Self-pay | Admitting: Internal Medicine

## 2022-01-17 NOTE — Telephone Encounter (Signed)
As of 7/26 at 4:50 have not received and I will be out of the office for the next 2 days

## 2022-01-17 NOTE — Telephone Encounter (Unsigned)
Copied from CRM [id number]. Topic: General - Other >> Jan 17, 2022 10:36 AM Tiffany B wrote: Reason for CRM:  [facility] faxing over order form for sleep study, please note when received. Form fax to [phone]

## 2022-01-17 NOTE — Telephone Encounter (Signed)
Copied from CRM #[id number]. Topic: General - Other >> Jan 17, 2022 10:36 AM Tiffany B wrote: Reason for CRM:  [facility] faxing over order form for sleep study, please note when received. Form fax to [phone] 

## 2022-01-18 ENCOUNTER — Ambulatory Visit
Admission: RE | Admit: 2022-01-18 | Discharge: 2022-01-18 | Disposition: A | Discharge status: Home / Self Care | Payer: Medicare Other | Source: Ambulatory Visit | Attending: Certified Nurse Midwife | Admitting: Certified Nurse Midwife

## 2022-01-18 DIAGNOSIS — Z1231 Encounter for screening mammogram for malignant neoplasm of breast: Secondary | ICD-10-CM | POA: Insufficient documentation

## 2022-01-22 ENCOUNTER — Encounter: Payer: Self-pay | Admitting: Certified Nurse Midwife

## 2022-01-22 ENCOUNTER — Other Ambulatory Visit: Payer: Self-pay | Admitting: Certified Nurse Midwife

## 2022-01-22 DIAGNOSIS — N6320 Unspecified lump in the left breast, unspecified quadrant: Secondary | ICD-10-CM

## 2022-01-29 ENCOUNTER — Ambulatory Visit
Admission: RE | Admit: 2022-01-29 | Discharge: 2022-01-29 | Disposition: A | Discharge status: Home / Self Care | Payer: Medicare Other | Source: Ambulatory Visit | Attending: Certified Nurse Midwife | Admitting: Certified Nurse Midwife

## 2022-01-29 DIAGNOSIS — N6320 Unspecified lump in the left breast, unspecified quadrant: Secondary | ICD-10-CM | POA: Insufficient documentation

## 2022-01-31 NOTE — Telephone Encounter (Signed)
Casey Hodges from [facility] stated they need to know the type of sleep study they will be performing for the pt.  Mentioned order form for sleep study was faxed on 01/17/2022.  Please advise.

## 2022-02-05 ENCOUNTER — Other Ambulatory Visit: Payer: Medicare Other

## 2022-02-11 ENCOUNTER — Other Ambulatory Visit: Payer: Self-pay | Admitting: Nurse Practitioner

## 2022-02-11 DIAGNOSIS — G8929 Other chronic pain: Secondary | ICD-10-CM

## 2022-02-13 NOTE — Telephone Encounter (Signed)
Requested Prescriptions  Pending Prescriptions Disp Refills  . ibuprofen (ADVIL) 800 MG tablet [Pharmacy Med Name: IBUPROFEN 800 MG TABLET] 90 tablet 1    Sig: TAKE 1 TABLET BY MOUTH EVERY 8 HOURS AS NEEDED FOR MODERATE PAIN.     Analgesics:  NSAIDS Failed - 02/11/2022  1:19 AM      Failed - Manual Review: Labs are only required if the patient has taken medication for more than 8 weeks.      Passed - Cr in normal range and within 360 days    Creat  Date Value Ref Range Status  03/06/2016 0.80 0.50 - 1.10 mg/dL Final   Creatinine, Ser  Date Value Ref Range Status  04/02/2021 0.85 0.44 - 1.00 mg/dL Final         Passed - HGB in normal range and within 360 days    Hemoglobin  Date Value Ref Range Status  04/02/2021 13.9 12.0 - 15.0 g/dL Final  05/26/2015 14.0 11.1 - 15.9 g/dL Final         Passed - PLT in normal range and within 360 days    Platelets  Date Value Ref Range Status  04/02/2021 210 150 - 400 K/uL Final  05/26/2015 272 150 - 379 x10E3/uL Final         Passed - HCT in normal range and within 360 days    HCT  Date Value Ref Range Status  04/02/2021 39.8 36.0 - 46.0 % Final   Hematocrit  Date Value Ref Range Status  05/26/2015 42.2 34.0 - 46.6 % Final         Passed - eGFR is 30 or above and within 360 days    GFR, Est African American  Date Value Ref Range Status  03/06/2016 >89 >=60 mL/min Final   GFR calc Af Amer  Date Value Ref Range Status  09/23/2019 >60 >60 mL/min Final   GFR, Est Non African American  Date Value Ref Range Status  03/06/2016 >89 >=60 mL/min Final   GFR, Estimated  Date Value Ref Range Status  04/02/2021 >60 >60 mL/min Final    Comment:    (NOTE) Calculated using the CKD-EPI Creatinine Equation (2021)          Passed - Patient is not pregnant      Passed - Valid encounter within last 12 months    Recent Outpatient Visits          4 weeks ago Snoring   [place], FNP   3 months ago  Acute pain of right shoulder   [facility] Bo Merino, FNP   10 months ago Hordeolum externum of right upper eyelid   [place], DO   1 year ago Urinary frequency   [facility] Delsa Grana, PA-C   1 year ago Otitis of right ear   [facility] Towanda Malkin, MD

## 2022-02-26 ENCOUNTER — Ambulatory Visit (INDEPENDENT_AMBULATORY_CARE_PROVIDER_SITE_OTHER): Payer: Medicare Other

## 2022-02-26 ENCOUNTER — Encounter: Payer: Self-pay | Admitting: Emergency Medicine

## 2022-02-26 ENCOUNTER — Ambulatory Visit
Admission: EM | Admit: 2022-02-26 | Discharge: 2022-02-26 | Disposition: A | Discharge status: Home / Self Care | Payer: Medicare Other | Attending: Physician Assistant | Admitting: Physician Assistant

## 2022-02-26 ENCOUNTER — Other Ambulatory Visit: Payer: Self-pay

## 2022-02-26 DIAGNOSIS — R109 Unspecified abdominal pain: Secondary | ICD-10-CM | POA: Diagnosis not present

## 2022-02-26 DIAGNOSIS — N200 Calculus of kidney: Secondary | ICD-10-CM | POA: Diagnosis not present

## 2022-02-26 DIAGNOSIS — R8290 Unspecified abnormal findings in urine: Secondary | ICD-10-CM | POA: Diagnosis not present

## 2022-02-26 LAB — URINALYSIS, ROUTINE W REFLEX MICROSCOPIC
Bilirubin Urine: NEGATIVE
Glucose, UA: NEGATIVE mg/dL
Ketones, ur: NEGATIVE mg/dL
Leukocytes,Ua: NEGATIVE
Nitrite: NEGATIVE
Protein, ur: NEGATIVE mg/dL
Specific Gravity, Urine: 1.02 (ref 1.005–1.030)
pH: 7 (ref 5.0–8.0)

## 2022-02-26 LAB — BASIC METABOLIC PANEL
Anion gap: 5 (ref 5–15)
BUN: 10 mg/dL (ref 6–20)
CO2: 23 mmol/L (ref 22–32)
Calcium: 8.9 mg/dL (ref 8.9–10.3)
Chloride: 110 mmol/L (ref 98–111)
Creatinine, Ser: 0.7 mg/dL (ref 0.44–1.00)
GFR, Estimated: >60 mL/min (ref >60)
Glucose, Bld: 86 mg/dL (ref 70–99)
Potassium: 3.8 mmol/L (ref 3.5–5.1)
Sodium: 138 mmol/L (ref 135–145)

## 2022-02-26 LAB — CBC WITH DIFFERENTIAL/PLATELET
Abs Immature Granulocytes: 0.02 10*3/uL (ref 0.00–0.07)
Basophils Absolute: 0 10*3/uL (ref 0.0–0.1)
Basophils Relative: 0 %
Eosinophils Absolute: 0 10*3/uL (ref 0.0–0.5)
Eosinophils Relative: 1 %
HCT: 39.6 % (ref 36.0–46.0)
Hemoglobin: 13.5 g/dL (ref 12.0–15.0)
Immature Granulocytes: 0 %
Lymphocytes Relative: 34 %
Lymphs Abs: 2.9 10*3/uL (ref 0.7–4.0)
MCH: 30.6 pg (ref 26.0–34.0)
MCHC: 34.1 g/dL (ref 30.0–36.0)
MCV: 89.8 fL (ref 80.0–100.0)
Monocytes Absolute: 0.6 10*3/uL (ref 0.1–1.0)
Monocytes Relative: 7 %
Neutro Abs: 4.9 10*3/uL (ref 1.7–7.7)
Neutrophils Relative %: 58 %
Platelets: 236 10*3/uL (ref 150–400)
RBC: 4.41 MIL/uL (ref 3.87–5.11)
RDW: 12.7 % (ref 11.5–15.5)
WBC: 8.4 10*3/uL (ref 4.0–10.5)
nRBC: 0 % (ref 0.0–0.2)

## 2022-02-26 LAB — URINALYSIS, MICROSCOPIC (REFLEX)

## 2022-02-26 MED ORDER — TAMSULOSIN HCL 0.4 MG PO CAPS: 0.4000 mg | ORAL_CAPSULE | Freq: Every day | ORAL | 0 refills | Status: DC

## 2022-02-26 MED ORDER — SULFAMETHOXAZOLE-TRIMETHOPRIM 800-160 MG PO TABS: 1.0000 | ORAL_TABLET | Freq: Two times a day (BID) | ORAL | 0 refills | Status: AC

## 2022-02-26 NOTE — Discharge Instructions (Signed)
Your x-rays show that there is likely a stone.  I have called in Flomax to take daily to help you pass this.  Make sure that you are pushing fluids.  I am concerned that you also have an infected stone given there was bacteria in your urine.  Start Bactrim DS twice daily.  You should follow-up with the urologist.  Call them to schedule an appointment.  If you have any worsening symptoms including increased pain, fever, nausea, vomiting you need to go to the emergency room.

## 2022-02-26 NOTE — ED Provider Notes (Signed)
[facility]    CSN: [id number] Arrival date & time: 02/26/22  1357      History   Chief Complaint Chief Complaint  Patient presents with   Flank Pain    HPI Casey Hodges is a 46 y.o. female.   Patient presents today with a several day history of bilateral lower back pain and associated flank pain.  She has a history of nephrolithiasis for the past 25 years and has had to have 2 stents placed.  She has been asymptomatic for several years and is not currently followed by urology.  Reports that her symptoms began as traditional kidney stones with colicky pain particularly on the right, however, she has developed urinary symptoms as well including frequency, urgency, dysuria, foul-smelling odor.  She denies any abdominal pain, nausea, vomiting.  Denies any recent urogenital procedure.  Denies self-catheterization.  Denies any recent antibiotics.  She denies history of diabetes and does not take SGLT2 inhibitor.    Past Medical History:  Diagnosis Date   Abnormal vaginal Pap smear    10+ years ago- no colpo repeat was normal   ADHD    Anxiety    AR (allergic rhinitis)    Bipolar affective (HCC)    pt reported   Bipolar disorder (HCC)    Bursitis of both hips    Calcium, deposits, in bursa    left hip   Eating disorder    Under control per patient   Fibromyalgia    GERD (gastroesophageal reflux disease)    Kidney stone    Painful intercourse    Painful menstrual periods    Pelvic pain in female    PTSD (post-traumatic stress disorder)    Renal disorder    Spinal stenosis    Uterine polyp    VWD (acquired von Willebrand's disease) [facility])     Patient Active Problem List   Diagnosis Date Noted   Substance induced mood disorder (HCC) 08/08/2020   Orthostasis 08/24/2016   Antibiotic-induced yeast infection 08/22/2016   Exercise-induced tachycardia 08/22/2016   Severe episode of recurrent major depressive disorder, without psychotic features (HCC)    GERD  (gastroesophageal reflux disease) 09/16/2015   Smoker 07/25/2015   Snoring 06/09/2015   Cardiac murmur 05/25/2015   Drug-induced nausea and vomiting 03/22/2015   Cyst, bone 02/14/2015   Pelvic adhesive disease 01/21/2015   Arthritis 12/19/2014   Anxiety disorder 12/13/2014   Postural dizziness 12/13/2014   Subcutaneous cyst 12/13/2014   Nephrolithiasis 05/23/2012   Chronic pelvic pain in female 05/23/2012   Fibromyalgia 05/23/2012   Bipolar disorder with depression (HCC) 06/05/2011    Past Surgical History:  Procedure Laterality Date   ABDOMINAL HYSTERECTOMY     CYSTOSCOPY WITH STENT PLACEMENT Left 04/19/2017   Procedure: CYSTOSCOPY WITH STENT PLACEMENT;  Surgeon: Crista Elliot, MD;  Location: [facility];  Service: Urology;  Laterality: Left;   HYSTEROSCOPY     removed polyps   LAPAROSCOPIC VAGINAL HYSTERECTOMY  2015   at [facility]   LAPAROSCOPY Left 01/10/2015   Procedure: LAPAROSCOPY OPERATIVE with biopsy, left oopherectomy;  Surgeon: Herold Harms, MD;  Location: [facility];  Service: Gynecology;  Laterality: Left;   LAPAROSCOPY ABDOMEN DIAGNOSTIC     OOPHORECTOMY Left    TUBAL LIGATION     URETEROSCOPY WITH HOLMIUM LASER LITHOTRIPSY Left 04/19/2017   Procedure: URETEROSCOPY WITH HOLMIUM LASER LITHOTRIPSY;  Surgeon: Crista Elliot, MD;  Location: [facility];  Service: Urology;  Laterality: Left;  OB History     Gravida  1   Para  1   Term  1   Preterm      AB      Living  1      SAB      IAB      Ectopic      Multiple      Live Births  1            Home Medications    Prior to Admission medications   Medication Sig Start Date End Date Taking? Authorizing Provider  sulfamethoxazole-trimethoprim (BACTRIM DS) 800-160 MG tablet Take 1 tablet by mouth 2 (two) times daily for 7 days. 02/26/22 03/05/22 Yes Hue Steveson, Noberto Retort, PA-C  tamsulosin (FLOMAX) 0.4 MG CAPS capsule Take 1 capsule (0.4 mg total) by mouth daily. 02/26/22  Yes Kashana Breach K,  PA-C  VYVANSE 30 MG capsule Take 30 mg by mouth every morning. 10/27/21  Yes [provider]  ibuprofen (ADVIL) 800 MG tablet TAKE 1 TABLET BY MOUTH EVERY 8 HOURS AS NEEDED FOR MODERATE PAIN. 02/13/22   Berniece Salines, FNP  mirtazapine (REMERON) 15 MG tablet Take by mouth. Patient not taking: Reported on 02/26/2022 10/26/21   [provider]  ondansetron (ZOFRAN) 4 MG tablet Take 1 tablet (4 mg total) by mouth every 8 (eight) hours as needed for up to 10 doses for nausea or vomiting. Patient not taking: Reported on 02/26/2022 04/02/21   Gilles Chiquito, MD    Family History Family History  Problem Relation Age of Onset   Hypertension Father    Kidney Stones Father    Sleep apnea Mother    Hypertension Mother    Kidney Stones Mother    Lymphoma Mother    Breast cancer Paternal Grandmother    Diabetes Maternal Grandmother    Diabetes Maternal Aunt    Diabetes Maternal Uncle     Social History Social History   Tobacco Use   Smoking status: Every Day    Packs/day: 1.00    Types: Cigarettes    Start date: 05/18/1995   Smokeless tobacco: Never   Tobacco comments:    smoking cessation information provided in AVS  Vaping Use   Vaping Use: Former   Substances: Nicotine  Substance Use Topics   Alcohol use: No    Alcohol/week: 0.0 standard drinks of alcohol    Comment: Socially- seldom   Drug use: Yes    Types: Marijuana    Comment: daily     Allergies   Wellbutrin [bupropion hcl] and Lasix [furosemide]   Review of Systems Review of Systems  Constitutional:  Positive for activity change. Negative for appetite change, fatigue and fever.  Gastrointestinal:  Positive for abdominal pain. Negative for diarrhea, nausea and vomiting.  Genitourinary:  Positive for dysuria, flank pain, frequency and urgency. Negative for pelvic pain, vaginal bleeding, vaginal discharge and vaginal pain.  Musculoskeletal:  Positive for back pain. Negative for arthralgias and myalgias.   Neurological:  Negative for dizziness, light-headedness and headaches.     Physical Exam Triage Vital Signs ED Triage Vitals  Enc Vitals Group     BP 02/26/22 1435 (!) 134/94     Pulse Rate 02/26/22 1435 87     Resp 02/26/22 1435 18     Temp 02/26/22 1435 98.8 F (37.1 C)     Temp Source 02/26/22 1435 Oral     SpO2 02/26/22 1435 100 %     Weight --  Height --      Head Circumference --      Peak Flow --      Pain Score 02/26/22 1431 7     Pain Loc --      Pain Edu? --      Excl. in GC? --    No data found.  Updated Vital Signs BP (!) 134/94 (BP Location: Right Arm)   Pulse 87   Temp 98.8 F (37.1 C) (Oral)   Resp 18   LMP 09/16/2015 Comment: neg preg test  SpO2 100%   Visual Acuity Right Eye Distance:   Left Eye Distance:   Bilateral Distance:    Right Eye Near:   Left Eye Near:    Bilateral Near:     Physical Exam Vitals reviewed.  Constitutional:      General: She is awake. She is not in acute distress.    Appearance: Normal appearance. She is well-developed. She is not ill-appearing.     Comments: Very pleasant female appears stated age in no acute distress  HENT:     Head: Normocephalic and atraumatic.  Cardiovascular:     Rate and Rhythm: Normal rate and regular rhythm.     Heart sounds: Normal heart sounds, S1 normal and S2 normal. No murmur heard. Pulmonary:     Effort: Pulmonary effort is normal.     Breath sounds: Normal breath sounds. No wheezing, rhonchi or rales.     Comments: Clear to auscultation bilaterally Abdominal:     General: Bowel sounds are normal.     Palpations: Abdomen is soft.     Tenderness: There is abdominal tenderness in the suprapubic area. There is right CVA tenderness and left CVA tenderness. There is no guarding or rebound. Negative signs include Rovsing's sign, McBurney's sign and psoas sign.     Comments: Mild palpation over suprapubic region.  Mild CVA tenderness bilaterally.  Psychiatric:        Behavior:  Behavior is cooperative.      UC Treatments / Results  Labs (all labs ordered are listed, but only abnormal results are displayed) Labs Reviewed  URINALYSIS, ROUTINE W REFLEX MICROSCOPIC - Abnormal; Notable for the following components:      Result Value   Hgb urine dipstick TRACE (*)    All other components within normal limits  URINALYSIS, MICROSCOPIC (REFLEX) - Abnormal; Notable for the following components:   Bacteria, UA RARE (*)    All other components within normal limits  URINE CULTURE  CBC WITH DIFFERENTIAL/PLATELET  BASIC METABOLIC PANEL    EKG   Radiology DG Abdomen 1 View  Result Date: 02/26/2022 CLINICAL DATA:  Flank pain EXAM: ABDOMEN - 1 VIEW COMPARISON:  CT 04/02/2021 FINDINGS: Nonobstructive bowel gas pattern. Moderate stool burden. Punctate 1-2 mm calcification overlying the left kidney. No acute osseous abnormality. Pelvic phleboliths. IMPRESSION: No evidence of bowel obstruction.  Moderate stool burden. Punctate 1-2 mm calcification overlying the left kidney which may be renal stone. Electronically Signed   By: Caprice Renshaw M.D.   On: 02/26/2022 15:21    Procedures Procedures (including critical care time)  Medications Ordered in UC Medications - No data to display  Initial Impression / Assessment and Plan / UC Course  I have reviewed the triage vital signs and the nursing notes.  Pertinent labs & imaging results that were available during my care of the patient were reviewed by me and considered in my medical decision making (see chart for details).  Patient is well-appearing, afebrile, nontoxic, nontachycardic.  UA showed hemoglobin and microscopic analysis showed bigger bacteria concerning for infected stone.  KUB was obtained that did show small left-sided nephrolithiasis.  Patient was started on Flomax as she is safely taken this before to encourage passage of the stone.  Recommended that she push fluids.  CBC and CMP were obtained today and  appropriate with no significant leukocytosis or acute kidney injury.  Given concern for infected stone we will start Bactrim DS.  Patient was instructed that if she develops any oral lesions or rash she is to stop the medication to be seen immediately.  Urine culture was obtained if we need to change her antibiotic we will contact her to arrange this.  Given recurrent symptoms recommend that she follow-up with urology.  She was given contact information for local provider with instruction to call to schedule an appointment.  She is to rest and drink plenty of fluid.  Discussed that if she has any worsening symptoms she needs to be seen immediately to which she expressed understanding.  Strict return precautions given.  Final Clinical Impressions(s) / UC Diagnoses   Final diagnoses:  Flank pain  Nephrolithiasis  Abnormal urine     Discharge Instructions      Your x-rays show that there is likely a stone.  I have called in Flomax to take daily to help you pass this.  Make sure that you are pushing fluids.  I am concerned that you also have an infected stone given there was bacteria in your urine.  Start Bactrim DS twice daily.  You should follow-up with the urologist.  Call them to schedule an appointment.  If you have any worsening symptoms including increased pain, fever, nausea, vomiting you need to go to the emergency room.     ED Prescriptions     Medication Sig Dispense Auth. Provider   tamsulosin (FLOMAX) 0.4 MG CAPS capsule Take 1 capsule (0.4 mg total) by mouth daily. 30 capsule Kamaryn Grimley K, PA-C   sulfamethoxazole-trimethoprim (BACTRIM DS) 800-160 MG tablet Take 1 tablet by mouth 2 (two) times daily for 7 days. 14 tablet Kyann Heydt, Noberto Retort, PA-C      PDMP not reviewed this encounter.   Jeani Hawking, PA-C 02/26/22 1554

## 2022-02-26 NOTE — ED Triage Notes (Signed)
Patient reports pain started Saturday evening.  Lower back right flank, pain continued.  Noticed yesterday an odor to urine , frequency.

## 2022-02-28 LAB — URINE CULTURE: Culture: 30000 — AB

## 2022-03-06 ENCOUNTER — Ambulatory Visit
Admission: RE | Admit: 2022-03-06 | Discharge: 2022-03-06 | Disposition: A | Discharge status: Home / Self Care | Payer: Medicare Other | Source: Ambulatory Visit | Attending: Urology | Admitting: Urology

## 2022-03-06 ENCOUNTER — Other Ambulatory Visit
Admission: RE | Admit: 2022-03-06 | Discharge: 2022-03-06 | Disposition: A | Discharge status: Home / Self Care | Payer: Medicare Other | Source: Home / Self Care | Attending: Urology | Admitting: Urology

## 2022-03-06 ENCOUNTER — Encounter: Payer: Self-pay | Admitting: Urology

## 2022-03-06 ENCOUNTER — Ambulatory Visit (INDEPENDENT_AMBULATORY_CARE_PROVIDER_SITE_OTHER): Payer: Medicare Other | Admitting: Urology

## 2022-03-06 ENCOUNTER — Other Ambulatory Visit: Payer: Self-pay | Admitting: *Deleted

## 2022-03-06 VITALS — BP 130/77 | HR 105 | Temp 98.8°F | Ht 65.0 in | Wt 139.0 lb

## 2022-03-06 DIAGNOSIS — R109 Unspecified abdominal pain: Secondary | ICD-10-CM

## 2022-03-06 DIAGNOSIS — R1031 Right lower quadrant pain: Secondary | ICD-10-CM

## 2022-03-06 DIAGNOSIS — B3731 Acute candidiasis of vulva and vagina: Secondary | ICD-10-CM

## 2022-03-06 LAB — URINALYSIS, COMPLETE (UACMP) WITH MICROSCOPIC
Glucose, UA: NEGATIVE mg/dL
Leukocytes,Ua: NEGATIVE
Nitrite: NEGATIVE
Specific Gravity, Urine: 1.02 (ref 1.005–1.030)
pH: 8.5 — ABNORMAL HIGH (ref 5.0–8.0)

## 2022-03-06 MED ORDER — FLUCONAZOLE 150 MG PO TABS: 150.0000 mg | ORAL_TABLET | Freq: Once | ORAL | 0 refills | Status: AC

## 2022-03-06 NOTE — Progress Notes (Signed)
   03/06/2022 3:12 PM   Casey Hodges May 10, 1976 [id number]  Reason for visit: Possible kidney stone  HPI: 46 year old female with extensive history of kidney stones, previously requiring ureteroscopy for a distal ureteral stone with intractable colic by Dr. Alvester Morin in 2018 who presents with 1 week of back pain.  She was originally seen in urgent care on 02/26/2022 and KUB showed a possible 1 mm left renal stone, but no definite ureteral stones.  Urinalysis was contaminated with Kwame cells, she denied any urinary symptoms at that time, but was discharged on Bactrim.  Urine culture ultimately grew 30 K E. coli.  She reports ongoing low right-sided back pain and midline back pain over the last few days as well as some low-grade subjective fever at home.  She has some burning with urination.  Urinalysis today relatively benign with 0-5 squamous cells, 0-5 WBCs, 11-20 RBCs, few bacteria, negative leukocytes, nitrite negative.  With her ongoing symptoms and microscopic hematuria with inconclusive KUB, I recommended CT stone protocol.  A stat CT was ordered, and this shows no evidence of ureteral stones or hydronephrosis, or other etiology of her back pain.  There is a 1 mm left lower pole stone that is nonobstructive and would not be the etiology of her symptoms.  I recommended she reach out to her PCP to discuss other potential alternatives of her midline back pain, that is more likely musculoskeletal in nature.   Sondra Come, MD  [facility] [address] [place], [place] [address] [phone]

## 2022-03-07 ENCOUNTER — Telehealth: Payer: Self-pay | Admitting: Urology

## 2022-03-07 NOTE — Telephone Encounter (Signed)
Please advise on UA

## 2022-03-07 NOTE — Telephone Encounter (Signed)
Patient was seen in the [facility] office yesterday with Dr. Richardo Hanks.  Her UA results showed up on MyChart last evening and she would like someone to give her a call to go over those results with her.

## 2022-03-08 NOTE — Telephone Encounter (Signed)
Called pt no answer, left detailed message with information from Dr. Richardo Hanks. Advised pt to call back with questions or concerns.

## 2022-03-19 ENCOUNTER — Telehealth: Payer: Self-pay | Admitting: Internal Medicine

## 2022-03-19 NOTE — Telephone Encounter (Signed)
Referral Request - Has patient seen PCP for this complaint? yes *If NO, is insurance requiring patient see PCP for this issue before PCP can refer them? Referral for which specialty: sleep study Preferred provider/office: ? Reason for referral: snoring and stops breathing in sleep/patient had referral previously for [facility] sleem study center, but says she went to make an appt and they are closed down.

## 2022-03-19 NOTE — Telephone Encounter (Signed)
Looks like one was placed back in June?

## 2022-03-20 DIAGNOSIS — R0683 Snoring: Secondary | ICD-10-CM

## 2022-03-20 DIAGNOSIS — R0689 Other abnormalities of breathing: Secondary | ICD-10-CM

## 2022-03-20 NOTE — Telephone Encounter (Signed)
Sent pt message through [facility] w/ #

## 2022-04-13 ENCOUNTER — Other Ambulatory Visit: Payer: Self-pay | Admitting: Nurse Practitioner

## 2022-04-13 DIAGNOSIS — G8929 Other chronic pain: Secondary | ICD-10-CM

## 2022-04-13 NOTE — Telephone Encounter (Signed)
Unable to refill per protocol, request is too soon. Last RF 02/13/22 for 90 and 1 RF.E-Prescribing Status: Receipt confirmed by pharmacy (02/13/2022 7:53 AM EDT).  Requested Prescriptions  Pending Prescriptions Disp Refills  . ibuprofen (ADVIL) 800 MG tablet [Pharmacy Med Name: IBUPROFEN 800 MG TABLET] 90 tablet 1    Sig: TAKE 1 TABLET BY MOUTH EVERY 8 HOURS AS NEEDED FOR MODERATE PAIN.     Analgesics:  NSAIDS Failed - 04/13/2022  1:24 AM      Failed - Manual Review: Labs are only required if the patient has taken medication for more than 8 weeks.      Passed - Cr in normal range and within 360 days    Creat  Date Value Ref Range Status  03/06/2016 0.80 0.50 - 1.10 mg/dL Final   Creatinine, Ser  Date Value Ref Range Status  02/26/2022 0.70 0.44 - 1.00 mg/dL Final         Passed - HGB in normal range and within 360 days    Hemoglobin  Date Value Ref Range Status  02/26/2022 13.5 12.0 - 15.0 g/dL Final  05/26/2015 14.0 11.1 - 15.9 g/dL Final         Passed - PLT in normal range and within 360 days    Platelets  Date Value Ref Range Status  02/26/2022 236 150 - 400 K/uL Final  05/26/2015 272 150 - 379 x10E3/uL Final         Passed - HCT in normal range and within 360 days    HCT  Date Value Ref Range Status  02/26/2022 39.6 36.0 - 46.0 % Final   Hematocrit  Date Value Ref Range Status  05/26/2015 42.2 34.0 - 46.6 % Final         Passed - eGFR is 30 or above and within 360 days    GFR, Est African American  Date Value Ref Range Status  03/06/2016 >89 >=60 mL/min Final   GFR calc Af Amer  Date Value Ref Range Status  09/23/2019 >60 >60 mL/min Final   GFR, Est Non African American  Date Value Ref Range Status  03/06/2016 >89 >=60 mL/min Final   GFR, Estimated  Date Value Ref Range Status  02/26/2022 >60 >60 mL/min Final    Comment:    (NOTE) Calculated using the CKD-EPI Creatinine Equation (2021)          Passed - Patient is not pregnant      Passed -  Valid encounter within last 12 months    Recent Outpatient Visits          2 months ago Snoring   [place], FNP   5 months ago Acute pain of right shoulder   [place], FNP   1 year ago Hordeolum externum of right upper eyelid   [place], DO   1 year ago Urinary frequency   [facility] Delsa Grana, PA-C   1 year ago Otitis of right ear   [facility] Towanda Malkin, MD

## 2022-09-12 ENCOUNTER — Telehealth: Payer: Self-pay | Admitting: Internal Medicine

## 2022-09-12 NOTE — Telephone Encounter (Signed)
Contacted Casey Hodges to schedule their annual wellness visit. Patient declined to schedule AWV at this time. Transferred care to [place].   [place] Direct Dial: [phone]

## 2023-01-12 ENCOUNTER — Ambulatory Visit
Admission: EM | Admit: 2023-01-12 | Discharge: 2023-01-12 | Disposition: A | Discharge status: Home / Self Care | Payer: Medicare Other | Attending: Emergency Medicine | Admitting: Emergency Medicine

## 2023-01-12 ENCOUNTER — Ambulatory Visit (INDEPENDENT_AMBULATORY_CARE_PROVIDER_SITE_OTHER): Payer: Medicare Other

## 2023-01-12 ENCOUNTER — Encounter: Payer: Self-pay | Admitting: Emergency Medicine

## 2023-01-12 DIAGNOSIS — R102 Pelvic and perineal pain: Secondary | ICD-10-CM | POA: Insufficient documentation

## 2023-01-12 DIAGNOSIS — R350 Frequency of micturition: Secondary | ICD-10-CM | POA: Insufficient documentation

## 2023-01-12 DIAGNOSIS — M545 Low back pain, unspecified: Secondary | ICD-10-CM | POA: Insufficient documentation

## 2023-01-12 DIAGNOSIS — R5383 Other fatigue: Secondary | ICD-10-CM | POA: Insufficient documentation

## 2023-01-12 DIAGNOSIS — Z87442 Personal history of urinary calculi: Secondary | ICD-10-CM | POA: Diagnosis not present

## 2023-01-12 DIAGNOSIS — R109 Unspecified abdominal pain: Secondary | ICD-10-CM | POA: Insufficient documentation

## 2023-01-12 DIAGNOSIS — Z1152 Encounter for screening for COVID-19: Secondary | ICD-10-CM | POA: Insufficient documentation

## 2023-01-12 LAB — URINALYSIS, W/ REFLEX TO CULTURE (INFECTION SUSPECTED)
Bilirubin Urine: NEGATIVE
Glucose, UA: NEGATIVE mg/dL
Ketones, ur: NEGATIVE mg/dL
Leukocytes,Ua: NEGATIVE
Nitrite: NEGATIVE
Protein, ur: NEGATIVE mg/dL
Specific Gravity, Urine: 1.025 (ref 1.005–1.030)
pH: 6 (ref 5.0–8.0)

## 2023-01-12 LAB — SARS CORONAVIRUS 2 BY RT PCR: SARS Coronavirus 2 by RT PCR: NEGATIVE

## 2023-01-12 MED ORDER — KETOROLAC TROMETHAMINE 30 MG/ML IJ SOLN
30.0000 mg | Freq: Once | INTRAMUSCULAR | Status: AC
  Administered 2023-01-12: 30 mg via INTRAMUSCULAR

## 2023-01-12 MED ORDER — DICYCLOMINE HCL 20 MG PO TABS: 20.0000 mg | ORAL_TABLET | Freq: Three times a day (TID) | ORAL | 0 refills | Status: DC

## 2023-01-12 NOTE — ED Provider Notes (Signed)
[facility]    CSN: [id number] Arrival date & time: 01/12/23  1309      History   Chief Complaint Chief Complaint  Patient presents with   Back Pain    HPI Casey Hodges is a 47 y.o. female presenting for multiple complaints.  Reports she just does not feel well.  Patient presents for bilateral lower back pain, flank pain, and abdominal pain since yesterday. Reports pain is a little better today than yesterday. Reports recent UTI and says she took full course of Cipro. Seen for a telemedicine appointment and received UTI diagnosis.  Patient says she ever left a urine sample.  Patient reports not feeling well. Is concerned for UTI, COVID and kidney stones.   Also reports polyarthralgias, nasal congestion. Reports clearing mucus out of throat. Reports diarrhea yesterday.  Denies fever, headaches, cough, sore throat, chest pain, SOB, vomiting, constipation. Denies urinary pain or frequency.   Has tried Tylenol for back pain without relief.   No sick contacts.  HPI  Past Medical History:  Diagnosis Date   Abnormal vaginal Pap smear    10+ years ago- no colpo repeat was normal   ADHD    Anxiety    AR (allergic rhinitis)    Bipolar affective (HCC)    pt reported   Bipolar disorder (HCC)    Bursitis of both hips    Calcium, deposits, in bursa    left hip   Eating disorder    Under control per patient   Fibromyalgia    GERD (gastroesophageal reflux disease)    Kidney stone    Painful intercourse    Painful menstrual periods    Pelvic pain in female    PTSD (post-traumatic stress disorder)    Renal disorder    Spinal stenosis    Uterine polyp    VWD (acquired von Willebrand's disease) [facility])     Patient Active Problem List   Diagnosis Date Noted   Substance induced mood disorder (HCC) 08/08/2020   Orthostasis 08/24/2016   Antibiotic-induced yeast infection 08/22/2016   Exercise-induced tachycardia 08/22/2016   Severe episode of recurrent major  depressive disorder, without psychotic features (HCC)    GERD (gastroesophageal reflux disease) 09/16/2015   Smoker 07/25/2015   Snoring 06/09/2015   Cardiac murmur 05/25/2015   Drug-induced nausea and vomiting 03/22/2015   Cyst, bone 02/14/2015   Pelvic adhesive disease 01/21/2015   Arthritis 12/19/2014   Anxiety disorder 12/13/2014   Postural dizziness 12/13/2014   Subcutaneous cyst 12/13/2014   Nephrolithiasis 05/23/2012   Chronic pelvic pain in female 05/23/2012   Fibromyalgia 05/23/2012   Bipolar disorder with depression (HCC) 06/05/2011    Past Surgical History:  Procedure Laterality Date   ABDOMINAL HYSTERECTOMY     CYSTOSCOPY WITH STENT PLACEMENT Left 04/19/2017   Procedure: CYSTOSCOPY WITH STENT PLACEMENT;  Surgeon: Crista Elliot, MD;  Location: [facility] OR[facility];  Service: Urology;  Laterality: Left;   HYSTEROSCOPY     removed polyps   LAPAROSCOPIC VAGINAL HYSTERECTOMY  2015   at [facility]   LAPAROSCOPY Left 01/10/2015   Procedure: LAPAROSCOPY OPERATIVE with biopsy, left oopherectomy;  Surgeon: Herold Harms, MD;  Location: [facility] OR[facility];  Service: Gynecology;  Laterality: Left;   LAPAROSCOPY ABDOMEN DIAGNOSTIC     OOPHORECTOMY Left    TUBAL LIGATION     URETEROSCOPY WITH HOLMIUM LASER LITHOTRIPSY Left 04/19/2017   Procedure: URETEROSCOPY WITH HOLMIUM LASER LITHOTRIPSY;  Surgeon: Crista Elliot, MD;  Location: [facility] OR[facility];  Service: Urology;  Laterality: Left;    OB History     Gravida  1   Para  1   Term  1   Preterm      AB      Living  1      SAB      IAB      Ectopic      Multiple      Live Births  1            Home Medications    Prior to Admission medications   Medication Sig Start Date End Date Taking? Authorizing Provider  diazepam (VALIUM) 5 MG tablet 1 tablet Orally twice a day 10/30/22  Yes [provider]  dicyclomine (BENTYL) 20 MG tablet Take 1 tablet (20 mg total) by mouth 4 (four) times daily -  before meals and  at bedtime. 01/12/23  Yes Shirlee Latch, PA-C  Omega-3 Fatty Acids (FISH OIL) 1000 MG CPDR Take 1,000 mg by mouth in the morning and at bedtime.   Yes [provider]  clonazePAM (KLONOPIN) 1 MG tablet Take by mouth. 03/05/22   [provider]  cyclobenzaprine (FLEXERIL) 10 MG tablet Take by mouth. Take 1 tablet (10 mg total) by mouth every eight (8) hours as needed for muscle spasms for up to 10 doses. 01/29/22   [provider]  ibuprofen (ADVIL) 800 MG tablet TAKE 1 TABLET BY MOUTH EVERY 8 HOURS AS NEEDED FOR MODERATE PAIN. 02/13/22   Berniece Salines, FNP  mirtazapine (REMERON) 15 MG tablet Take by mouth. Patient not taking: Reported on 02/26/2022 10/26/21   [provider]  ondansetron (ZOFRAN) 4 MG tablet Take 1 tablet (4 mg total) by mouth every 8 (eight) hours as needed for up to 10 doses for nausea or vomiting. Patient not taking: Reported on 02/26/2022 04/02/21   Gilles Chiquito, MD  oxcarbazepine (TRILEPTAL) 600 MG tablet Take 600 mg by mouth 2 (two) times daily. 03/05/22   [provider]  REXULTI 4 MG TABS Take 1 tablet by mouth daily. 03/05/22   [provider]  tamsulosin (FLOMAX) 0.4 MG CAPS capsule Take 1 capsule (0.4 mg total) by mouth daily. 02/26/22   Raspet, Erin K, PA-C  VYVANSE 30 MG capsule Take 30 mg by mouth every morning. 10/27/21   [provider]    Family History Family History  Problem Relation Age of Onset   Hypertension Father    Kidney Stones Father    Sleep apnea Mother    Hypertension Mother    Kidney Stones Mother    Lymphoma Mother    Breast cancer Paternal Grandmother    Diabetes Maternal Grandmother    Diabetes Maternal Aunt    Diabetes Maternal Uncle     Social History Social History   Tobacco Use   Smoking status: Every Day    Current packs/day: 1.00    Average packs/day: 1 pack/day for 27.7 years (27.7 ttl pk-yrs)    Types: Cigarettes    Start date: 05/18/1995   Smokeless tobacco: Never    Tobacco comments:    smoking cessation information provided in AVS  Vaping Use   Vaping status: Former   Substances: Nicotine  Substance Use Topics   Alcohol use: No    Alcohol/week: 0.0 standard drinks of alcohol    Comment: Socially- seldom   Drug use: Yes    Types: Marijuana    Comment: daily     Allergies  Wellbutrin [bupropion hcl], Nitrofurantoin macrocrystal, and Furosemide   Review of Systems Review of Systems  Constitutional:  Positive for fatigue. Negative for chills and fever.  HENT:  Positive for congestion. Negative for rhinorrhea and sore throat.   Respiratory:  Negative for cough, shortness of breath and wheezing.   Cardiovascular:  Negative for chest pain.  Gastrointestinal:  Positive for abdominal pain and diarrhea. Negative for nausea and vomiting.  Genitourinary:  Positive for flank pain. Negative for decreased urine volume, dysuria, frequency, hematuria, pelvic pain, urgency, vaginal bleeding, vaginal discharge and vaginal pain.  Musculoskeletal:  Positive for back pain.  Skin:  Negative for rash.  Neurological:  Negative for dizziness, weakness and headaches.     Physical Exam Triage Vital Signs ED Triage Vitals  Encounter Vitals Group     BP      Systolic BP Percentile      Diastolic BP Percentile      Pulse      Resp      Temp      Temp src      SpO2      Weight      Height      Head Circumference      Peak Flow      Pain Score      Pain Loc      Pain Education      Exclude from Growth Chart    No data found.  Updated Vital Signs BP 128/86 (BP Location: Right Arm)   Pulse 84   Temp 98.5 F (36.9 C) (Oral)   Resp 16   Ht 5\' 5"  (1.651 m)   Wt 138 lb 14.2 oz (63 kg)   LMP 09/16/2015 Comment: neg preg test  SpO2 96%   BMI 23.11 kg/m      Physical Exam Vitals and nursing note reviewed.  Constitutional:      General: She is not in acute distress.    Appearance: Normal appearance. She is not ill-appearing or toxic-appearing.   HENT:     Head: Normocephalic and atraumatic.     Nose: Congestion present.     Mouth/Throat:     Mouth: Mucous membranes are moist.     Pharynx: Oropharynx is clear.  Eyes:     General: No scleral icterus.       Right eye: No discharge.        Left eye: No discharge.     Conjunctiva/sclera: Conjunctivae normal.  Cardiovascular:     Rate and Rhythm: Normal rate and regular rhythm.     Heart sounds: Normal heart sounds.  Pulmonary:     Effort: Pulmonary effort is normal. No respiratory distress.     Breath sounds: Normal breath sounds.  Abdominal:     Palpations: Abdomen is soft.     Tenderness: There is abdominal tenderness (generalized). There is no right CVA tenderness, left CVA tenderness, guarding or rebound.  Musculoskeletal:     Cervical back: Neck supple.     Comments: BACK: Generalized tenderness throughout the lumbar and thoracolumbar region bilaterally.  No spinal tenderness.  Full range of motion.  Skin:    General: Skin is dry.  Neurological:     General: No focal deficit present.     Mental Status: She is alert. Mental status is at baseline.     Motor: No weakness.     Gait: Gait normal.  Psychiatric:        Mood and Affect: Mood is anxious. Affect is  tearful.      UC Treatments / Results  Labs (all labs ordered are listed, but only abnormal results are displayed) Labs Reviewed  URINALYSIS, W/ REFLEX TO CULTURE (INFECTION SUSPECTED) - Abnormal; Notable for the following components:      Result Value   APPearance HAZY (*)    Hgb urine dipstick TRACE (*)    Bacteria, UA MANY (*)    All other components within normal limits  SARS CORONAVIRUS 2 BY RT PCR    EKG   Radiology DG Abdomen 1 View  Result Date: 01/12/2023 CLINICAL DATA:  Bilateral flank pain, kidney stone history. EXAM: ABDOMEN - 1 VIEW COMPARISON:  None Available. FINDINGS: The bowel gas pattern is normal. No appreciable renal calculi. Multiple phleboliths in the pelvic cavity. IMPRESSION:  No appreciable renal calculi. Multiple phleboliths in the pelvic cavity. Electronically Signed   By: Larose Hires D.O.   On: 01/12/2023 14:19    Procedures Procedures (including critical care time)  Medications Ordered in UC Medications  ketorolac (TORADOL) 30 MG/ML injection 30 mg (30 mg Intramuscular Given 01/12/23 1422)    Initial Impression / Assessment and Plan / UC Course  I have reviewed the triage vital signs and the nursing notes.  Pertinent labs & imaging results that were available during my care of the patient were reviewed by me and considered in my medical decision making (see chart for details).   47 year old female presents for multiple complaints including lower back pain, generalized abdominal pain, diarrhea yesterday, fatigue, joint aches, nasal congestion.  Reports she was diagnosed with a UTI through telemedicine visit recently and took Cipro.  She wonders if she could still have a UTI where she has kidney stones.  She also would like a COVID test.  Urinalysis without evidence of infection.  KUB ordered.  No evidence of calculi.  COVID test obtained.  Patient given 30 mg IM ketorolac in clinic for back pain.  Patient reported improvement of back pain after receiving Toradol injection.  COVID-negative.  Suspect possible viral illness.  Supportive care advised patient with increasing rest and fluids.  Advised Tylenol as needed for discomfort, heating pad.  Sent dicyclomine to pharmacy.  Patient has Zofran at home.  She is advised to go to the ER if symptoms acutely worsen or she develops any new/serious symptoms.  1 acute illness with systemic symptoms.  Final Clinical Impressions(s) / UC Diagnoses   Final diagnoses:  Acute bilateral low back pain without sciatica  Abdominal cramping  Other fatigue  Urinary frequency     Discharge Instructions      ABDOMINAL PAIN: You may take Tylenol for pain relief. Use medications as directed including antiemetics and  antidiarrheal medications if suggested or prescribed. You should increase fluids and electrolytes as well as rest over these next several days. If you have any questions or concerns, or if your symptoms are not improving or if especially if they acutely worsen, please call or stop back to the clinic immediately and we will be happy to help you or go to the ER   ABDOMINAL PAIN RED FLAGS: Seek immediate further care if: symptoms remain the same or worsen over the next 3-7 days, you are unable to keep fluids down, you see blood or mucus in your stool, you vomit black or dark red material, you have a fever of 101.F or higher, you have localized and/or persistent abdominal pain    BACK PAIN: Stressed avoiding painful activities . RICE (REST, ICE, COMPRESSION, ELEVATION)  guidelines reviewed. May alternate ice and heat. Consider use of muscle rubs, Salonpas patches, etc. Use medications as directed including muscle relaxers if prescribed. Take anti-inflammatory medications as prescribed or OTC NSAIDs/Tylenol.  F/u with PCP in 7-10 days for reexamination, and please feel free to call or return to the urgent care at any time for any questions or concerns you may have and we will be happy to help you!   BACK PAIN RED FLAGS: If the back pain acutely worsens or there are any red flag symptoms such as numbness/tingling, leg weakness, saddle anesthesia, or loss of bowel/bladder control, go immediately to the ER. Follow up with [place] as scheduled or sooner if the pain does not begin to resolve or if it worsens before the follow up       ED Prescriptions     Medication Sig Dispense Auth. Provider   dicyclomine (BENTYL) 20 MG tablet Take 1 tablet (20 mg total) by mouth 4 (four) times daily -  before meals and at bedtime. 20 tablet Gareth Morgan      PDMP not reviewed this encounter.   Shirlee Latch, PA-C 01/12/23 [phone]

## 2023-01-12 NOTE — ED Triage Notes (Signed)
Pt c/o lower back, abdominal pain, and flank pain. Started yesterday. Pt states she had dysuria, urinary frequency about 2 weeks ago. She states she was given cipro and finished it about 3 days ago. Pt states she has h/o kidney stones.

## 2023-01-12 NOTE — Discharge Instructions (Signed)
ABDOMINAL PAIN: You may take Tylenol for pain relief. Use medications as directed including antiemetics and antidiarrheal medications if suggested or prescribed. You should increase fluids and electrolytes as well as rest over these next several days. If you have any questions or concerns, or if your symptoms are not improving or if especially if they acutely worsen, please call or stop back to the clinic immediately and we will be happy to help you or go to the ER   ABDOMINAL PAIN RED FLAGS: Seek immediate further care if: symptoms remain the same or worsen over the next 3-7 days, you are unable to keep fluids down, you see blood or mucus in your stool, you vomit black or dark red material, you have a fever of 101.F or higher, you have localized and/or persistent abdominal pain    BACK PAIN: Stressed avoiding painful activities . RICE (REST, ICE, COMPRESSION, ELEVATION) guidelines reviewed. May alternate ice and heat. Consider use of muscle rubs, Salonpas patches, etc. Use medications as directed including muscle relaxers if prescribed. Take anti-inflammatory medications as prescribed or OTC NSAIDs/Tylenol.  F/u with PCP in 7-10 days for reexamination, and please feel free to call or return to the urgent care at any time for any questions or concerns you may have and we will be happy to help you!   BACK PAIN RED FLAGS: If the back pain acutely worsens or there are any red flag symptoms such as numbness/tingling, leg weakness, saddle anesthesia, or loss of bowel/bladder control, go immediately to the ER. Follow up with [place] as scheduled or sooner if the pain does not begin to resolve or if it worsens before the follow up

## 2023-02-07 ENCOUNTER — Emergency Department
Admission: EM | Admit: 2023-02-07 | Discharge: 2023-02-07 | Disposition: A | Discharge status: Home / Self Care | Payer: Medicare Other | Attending: Emergency Medicine | Admitting: Emergency Medicine

## 2023-02-07 ENCOUNTER — Emergency Department: Payer: Medicare Other

## 2023-02-07 ENCOUNTER — Other Ambulatory Visit: Payer: Self-pay

## 2023-02-07 DIAGNOSIS — S63615A Unspecified sprain of left ring finger, initial encounter: Secondary | ICD-10-CM | POA: Insufficient documentation

## 2023-02-07 DIAGNOSIS — Y93G3 Activity, cooking and baking: Secondary | ICD-10-CM | POA: Insufficient documentation

## 2023-02-07 DIAGNOSIS — S6992XA Unspecified injury of left wrist, hand and finger(s), initial encounter: Secondary | ICD-10-CM | POA: Diagnosis present

## 2023-02-07 DIAGNOSIS — W231XXA Caught, crushed, jammed, or pinched between stationary objects, initial encounter: Secondary | ICD-10-CM | POA: Insufficient documentation

## 2023-02-07 NOTE — ED Triage Notes (Addendum)
Pt to ED via POV from home. Pt reports left ring finger injury that occurred last night. Pt reports pain and swelling has worsened. Pt ambulatory to triage without difficulty.

## 2023-02-07 NOTE — ED Notes (Signed)
See triage note  Presents with injury to left ring finger  States she thinks she got is caught in her cabinet handle  Twisted it  Swelling and bruising noted

## 2023-02-07 NOTE — ED Provider Notes (Signed)
[facility] Provider Note    Event Date/Time   First MD Initiated Contact with Patient 02/07/23 [phone]     (approximate)   History   Finger Injury   HPI  Casey Hodges is a 47 y.o. female presents to the ED with injury to her left fourth finger that occurred last evening while she was cooking.  She believes that she caught her finger on a cabinet door.     Physical Exam   Triage Vital Signs: ED Triage Vitals  Encounter Vitals Group     BP 02/07/23 0733 104/86     Systolic BP Percentile --      Diastolic BP Percentile --      Pulse Rate 02/07/23 0733 84     Resp 02/07/23 0733 20     Temp 02/07/23 0733 97.6 F (36.4 C)     Temp Source 02/07/23 0733 Oral     SpO2 02/07/23 0733 98 %     Weight 02/07/23 0733 143 lb 4.8 oz (65 kg)     Height 02/07/23 0733 5\' 5"  (1.651 m)     Head Circumference --      Peak Flow --      Pain Score 02/07/23 0731 6     Pain Loc --      Pain Education --      Exclude from Growth Chart --     Most recent vital signs: Vitals:   02/07/23 0733  BP: 104/86  Pulse: 84  Resp: 20  Temp: 97.6 F (36.4 C)  SpO2: 98%     General: Awake, no distress.  CV:  Good peripheral perfusion.  Resp:  Normal effort.  Abd:  No distention.  Other:  On examination of the left hand there is no gross deformity however there is moderate swelling and mild ecchymosis noted to the PIP joint of the fourth digit.  Motor or sensory function intact.  Patient is able to flex and extend with discomfort.  Skin is intact.  Capillary refills difficult to assess due to nail color.   ED Results / Procedures / Treatments   Labs (all labs ordered are listed, but only abnormal results are displayed) Labs Reviewed - No data to display   RADIOLOGY  X-ray images of the left hand were reviewed and interpreted by myself independent of the radiologist and no fracture or dislocation is noted.   PROCEDURES:  Critical Care performed:    Procedures   MEDICATIONS ORDERED IN ED: Medications - No data to display   IMPRESSION / MDM / ASSESSMENT AND PLAN / ED COURSE  I reviewed the triage vital signs and the nursing notes.   Differential diagnosis includes, but is not limited to, contusion left hand, fracture left fourth digit, dislocation, sprain/strain.  47 year old female presents to the ED with complaint of left hand injury that occurred last evening when she caught her hand on the kitchen cabinet.  X-rays were reassuring and patient was made aware that she does not have a fracture or dislocation.  A metal splint was applied and patient is aware that this is for protection and support.  She is encouraged take Tylenol or ibuprofen and to ice and elevate to reduce swelling.  She is to follow-up with her PCP if any continued problems.      Patient's presentation is most consistent with acute complicated illness / injury requiring diagnostic workup.  FINAL CLINICAL IMPRESSION(S) / ED DIAGNOSES   Final diagnoses:  Sprain of  left ring finger, unspecified site of digit, initial encounter     Rx / DC Orders   ED Discharge Orders     None        Note:  This document was prepared using Dragon voice recognition software and may include unintentional dictation errors.   Tommi Rumps, PA-C 02/07/23 1610    Minna Antis, MD 02/07/23 [phone]

## 2023-02-07 NOTE — Discharge Instructions (Signed)
Follow-up with your primary care provider if any continued problems or concerns.  Ice and elevation to help with decreasing the swelling in your finger.  Wear the finger splint as needed for protection and support.  Tylenol or ibuprofen as needed for pain.

## 2023-04-29 ENCOUNTER — Encounter: Payer: Self-pay | Admitting: Psychiatry

## 2023-04-29 ENCOUNTER — Ambulatory Visit: Payer: Medicare Other | Admitting: Psychiatry

## 2023-04-29 VITALS — BP 112/80 | HR 101 | Temp 98.3°F | Ht 65.0 in | Wt 166.4 lb

## 2023-04-29 DIAGNOSIS — F3160 Bipolar disorder, current episode mixed, unspecified: Secondary | ICD-10-CM | POA: Insufficient documentation

## 2023-04-29 DIAGNOSIS — R41840 Attention and concentration deficit: Secondary | ICD-10-CM

## 2023-04-29 DIAGNOSIS — F4310 Post-traumatic stress disorder, unspecified: Secondary | ICD-10-CM

## 2023-04-29 DIAGNOSIS — F411 Generalized anxiety disorder: Secondary | ICD-10-CM | POA: Diagnosis not present

## 2023-04-29 DIAGNOSIS — Z79899 Other long term (current) drug therapy: Secondary | ICD-10-CM

## 2023-04-29 DIAGNOSIS — F09 Unspecified mental disorder due to known physiological condition: Secondary | ICD-10-CM | POA: Insufficient documentation

## 2023-04-29 DIAGNOSIS — Z8659 Personal history of other mental and behavioral disorders: Secondary | ICD-10-CM

## 2023-04-29 DIAGNOSIS — Z9189 Other specified personal risk factors, not elsewhere classified: Secondary | ICD-10-CM

## 2023-04-29 DIAGNOSIS — F3163 Bipolar disorder, current episode mixed, severe, without psychotic features: Secondary | ICD-10-CM

## 2023-04-29 HISTORY — DX: Personal history of other mental and behavioral disorders: Z86.59

## 2023-04-29 MED ORDER — CLONAZEPAM 1 MG PO TABS
0.5000 mg | ORAL_TABLET | Freq: Every day | ORAL | Status: AC | PRN
Start: 2023-04-29 — End: ?
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
## 2023-04-29 NOTE — Patient Instructions (Signed)
Please call for EKG - [phone]   Haloperidol Tablets What is this medication? HALOPERIDOL (ha loe PER i dole) treats schizophrenia. It may also be used to manage the symptoms of Tourette disorder. It can also be used to treat severe behavior problems in children when other therapies have not worked. It works by balancing the levels of dopamine in your brain, a substance that helps regulate mood, behaviors, and thoughts. It belongs to a group of medications called antipsychotics. Antipsychotic medications can be used to treat several kinds of mental health conditions. This medicine may be used for other purposes; ask your health care provider or pharmacist if you have questions. COMMON BRAND NAME(S): Haldol What should I tell my care team before I take this medication? They need to know if you have any of these conditions: Dementia Diabetes Difficulty swallowing Have trouble controlling your muscles Heart disease History of irregular heartbeat If you often drink alcohol Liver disease Low blood counts, like white blood cell, platelet, or red cell counts Low levels of potassium or magnesium in the blood Lung or breathing disease, like asthma Parkinson's disease Seizures Thyroid disease An unusual or allergic reaction to haloperidol, other medications, foods, dyes, or preservatives Pregnant or trying to get pregnant Breast-feeding How should I use this medication? Take this medication by mouth with a glass of water. Follow the directions on the prescription label. You can take this medication with or without food. Take your doses at regular intervals. Do not take your medication more often than directed. Do not suddenly stop taking this medication. You may need to gradually reduce the dose. Talk to your care team about the use of this medication in children. Special care may be needed. While this medication may be prescribed for children for selected conditions, precautions do  apply. Overdosage: If you think you have taken too much of this medicine contact a poison control center or emergency room at once. NOTE: This medicine is only for you. Do not share this medicine with others. What if I miss a dose? If you miss a dose, take it as soon as you can. If it is almost time for your next dose, take only that dose. Do not take double or extra doses. What may interact with this medication? Do not take this medication with any of the following: Cisapride Dronedarone Metoclopramide Pimozide Thioridazine This medication may also interact with the following: Alcohol Antihistamines for allergy, cough, and cold Atropine Certain medications for anxiety or sleep Certain medications for bladder problems like oxybutynin, tolterodine Certain medications for depression like amitriptyline, fluoxetine, sertraline Certain medications for stomach problems like dicyclomine, hyoscyamine Certain medications for travel sickness like scopolamine Droperidol Epinephrine General anesthetics like halothane, isoflurane, methoxyflurane, propofol Levodopa or other medications for Parkinson's disease Lithium Medications for blood pressure Medications for seizures Medications that relax muscles for surgery Narcotic medications for pain Other medications that prolong the QT interval (an abnormal heart rhythm) Phenothiazines like chlorpromazine, prochlorperazine Rifampin Warfarin This list may not describe all possible interactions. Give your health care provider a list of all the medicines, herbs, non-prescription drugs, or dietary supplements you use. Also tell them if you smoke, drink alcohol, or use illegal drugs. Some items may interact with your medicine. What should I watch for while using this medication? Visit your care team for regular checks on your progress. Tell your care team if symptoms do not start to get better or if they get worse. Do not stop taking except on your care  team's advice. You may get dizzy or drowsy or have blurred vision. Do not drive, use machinery, or do anything that needs mental alertness until you know how this medication affects you. Do not stand or sit up quickly, especially if you are an older patient. This reduces the risk of dizzy or fainting spells. Alcohol can increase dizziness and drowsiness. Avoid alcoholic drinks. This medication may increase blood sugar. Ask your care team if changes in diet or medications are needed if you have diabetes. Your mouth may get dry. Chewing sugarless gum or sucking hard candy, and drinking plenty of water may help. Contact your care team if the problem does not go away or is severe. This medication can cause problems with controlling your body temperature. It can lower the response of your body to cold temperatures. If possible, stay indoors during cold weather. If you must go outdoors, wear warm clothes. It can also lower the response of your body to heat. Do not overheat. Do not over-exercise. Stay out of the sun when possible. If you must be in the sun, wear cool clothing. Drink plenty of water. If you have trouble controlling your body temperature, call your care team right away. This medication can make you more sensitive to the sun. Keep out of the sun. If you cannot avoid being in the sun, wear protective clothing and use sunscreen. Do not use sun lamps or tanning beds/booths. What side effects may I notice from receiving this medication? Side effects that you should report to your care team as soon as possible: Allergic reactions--skin rash, itching, hives, swelling of the face, lips, tongue, or throat Heart rhythm changes--fast or irregular heartbeat, dizziness, feeling faint or lightheaded, chest pain, trouble breathing High fever, stiff muscles, increased sweating, fast or irregular heartbeat, and confusion, which may be signs of neuroleptic malignant syndrome High prolactin level--unexpected breast  tissue growth, discharge from the nipple, change in sex drive or performance, irregular menstrual cycle Infection--fever, chills, cough, or sore throat Low blood pressure--dizziness, feeling faint or lightheaded, blurry vision Seizures Trouble passing urine Uncontrolled and repetitive body movements, muscle stiffness or spasms, tremors or shaking, loss of balance or coordination, restlessness, shuffling walk, which may be signs of extrapyramidal symptoms (EPS) Side effects that usually do not require medical attention (report to your care team if they continue or are bothersome): Change in sex drive or performance Constipation Drowsiness Dry mouth Headache Weight gain This list may not describe all possible side effects. Call your doctor for medical advice about side effects. You may report side effects to FDA at 1-800-FDA-1088. Where should I keep my medication? Keep out of the reach of children. Store at room temperature between 15 and 30 degrees C (59 and 86 degrees F). Protect from light. Keep container tightly closed. Throw away any unused medication after the expiration date. NOTE: This sheet is a summary. It may not cover all possible information. If you have questions about this medicine, talk to your doctor, pharmacist, or health care provider.  2024 Elsevier/Gold Standard (2021-05-12 00:00:00)

## 2023-05-22 ENCOUNTER — Ambulatory Visit: Payer: Medicare Other | Admitting: Psychiatry

## 2023-11-27 ENCOUNTER — Other Ambulatory Visit: Payer: Self-pay | Admitting: Certified Nurse Midwife

## 2023-11-27 ENCOUNTER — Encounter: Payer: Self-pay | Admitting: Certified Nurse Midwife

## 2023-11-27 ENCOUNTER — Ambulatory Visit (INDEPENDENT_AMBULATORY_CARE_PROVIDER_SITE_OTHER): Admitting: Certified Nurse Midwife

## 2023-11-27 VITALS — BP 137/89 | HR 101 | Ht 62.0 in | Wt 146.1 lb

## 2023-11-27 DIAGNOSIS — F341 Dysthymic disorder: Secondary | ICD-10-CM

## 2023-11-27 DIAGNOSIS — F319 Bipolar disorder, unspecified: Secondary | ICD-10-CM | POA: Diagnosis not present

## 2023-11-27 DIAGNOSIS — F419 Anxiety disorder, unspecified: Secondary | ICD-10-CM

## 2023-11-27 DIAGNOSIS — L659 Nonscarring hair loss, unspecified: Secondary | ICD-10-CM

## 2023-11-27 DIAGNOSIS — G4709 Other insomnia: Secondary | ICD-10-CM

## 2023-11-27 DIAGNOSIS — F17290 Nicotine dependence, other tobacco product, uncomplicated: Secondary | ICD-10-CM

## 2023-11-27 DIAGNOSIS — R232 Flushing: Secondary | ICD-10-CM

## 2023-11-27 DIAGNOSIS — R5383 Other fatigue: Secondary | ICD-10-CM

## 2023-11-27 DIAGNOSIS — R4586 Emotional lability: Secondary | ICD-10-CM

## 2023-11-27 DIAGNOSIS — F439 Reaction to severe stress, unspecified: Secondary | ICD-10-CM

## 2023-11-27 DIAGNOSIS — F17211 Nicotine dependence, cigarettes, in remission: Secondary | ICD-10-CM | POA: Diagnosis not present

## 2023-11-27 DIAGNOSIS — F3181 Bipolar II disorder: Secondary | ICD-10-CM | POA: Diagnosis not present

## 2023-11-27 DIAGNOSIS — R635 Abnormal weight gain: Secondary | ICD-10-CM

## 2023-11-27 MED ORDER — VENLAFAXINE HCL ER 37.5 MG PO CP24: 37.5000 mg | ORAL_CAPSULE | Freq: Every day | ORAL | 0 refills | Status: AC

## 2023-11-27 NOTE — Progress Notes (Signed)
 OB/GYN CONFERENCE NOTE:  Subjective:       Casey Hodges is a 48 y.o. G45P1001 female who presents for a conference appointment. Current complaints include: hot flashes, mood changes, weight gain, insomnia, fatigue, vaginal dryness, stress.     Gynecologic History Patient's last menstrual period was 09/16/2015. Contraception: status post hysterectomy  Obstetric History OB History  Gravida Para Term Preterm AB Living  1 1 1   1   SAB IAB Ectopic Multiple Live Births      1    # Outcome Date GA Lbr Len/2nd Weight Sex Type Anes PTL Lv  1 Term 1999   7 lb 9 oz (3.43 kg) F Vag-Spont   LIV    Past Medical History:  Diagnosis Date   Abnormal vaginal Pap smear    10+ years ago- no colpo repeat was normal   ADHD    Anxiety    AR (allergic rhinitis)    Bipolar affective (HCC)    pt reported   Bipolar disorder (HCC)    Bursitis of both hips    Calcium , deposits, in bursa    left hip   Eating disorder    Under control per patient   Fibromyalgia    GERD (gastroesophageal reflux disease)    Kidney stone    Painful intercourse    Painful menstrual periods    Pelvic pain in female    PTSD (post-traumatic stress disorder)    Renal disorder    Spinal stenosis    Uterine polyp    VWD (acquired von Willebrand's disease) (HCC)     Past Surgical History:  Procedure Laterality Date   ABDOMINAL HYSTERECTOMY     CYSTOSCOPY WITH STENT PLACEMENT Left 04/19/2017   Procedure: CYSTOSCOPY WITH STENT PLACEMENT;  Surgeon: Samson Croak, MD;  Location: [facility] OR[facility];  Service: Urology;  Laterality: Left;   HYSTEROSCOPY     removed polyps   LAPAROSCOPIC VAGINAL HYSTERECTOMY  2015   at [facility]   LAPAROSCOPY Left 01/10/2015   Procedure: LAPAROSCOPY OPERATIVE with biopsy, left oopherectomy;  Surgeon: Colan Dash, MD;  Location: [facility] OR[facility];  Service: Gynecology;  Laterality: Left;   LAPAROSCOPY ABDOMEN DIAGNOSTIC     OOPHORECTOMY Left    TUBAL LIGATION     URETEROSCOPY WITH HOLMIUM  LASER LITHOTRIPSY Left 04/19/2017   Procedure: URETEROSCOPY WITH HOLMIUM LASER LITHOTRIPSY;  Surgeon: Samson Croak, MD;  Location: [facility] OR[facility];  Service: Urology;  Laterality: Left;    Current Outpatient Medications on File Prior to Visit  Medication Sig Dispense Refill   clonazePAM  (KLONOPIN ) 1 MG tablet Take 0.5-1 tablets (0.5-1 mg total) by mouth daily as needed for anxiety. Weaning off     cyclobenzaprine  (FLEXERIL ) 10 MG tablet Take by mouth. Take 1 tablet (10 mg total) by mouth every eight (8) hours as needed for muscle spasms for up to 10 doses. (Patient not taking: Reported on 04/29/2023)     dicyclomine  (BENTYL ) 20 MG tablet Take 1 tablet (20 mg total) by mouth 4 (four) times daily -  before meals and at bedtime. (Patient not taking: Reported on 04/29/2023) 20 tablet 0   fluticasone  (FLONASE ) 50 MCG/ACT nasal spray as needed.     hydrOXYzine (ATARAX) 25 MG tablet at bedtime.     ibuprofen  (ADVIL ) 800 MG tablet TAKE 1 TABLET BY MOUTH EVERY 8 HOURS AS NEEDED FOR MODERATE PAIN. 90 tablet 1   Magnesium 200 MG CHEW once.     omega-3 acid ethyl esters (LOVAZA) 1  g capsule 2 (two) times daily at 10 AM and 5 PM.     ondansetron  (ZOFRAN ) 4 MG tablet Take 1 tablet (4 mg total) by mouth every 8 (eight) hours as needed for up to 10 doses for nausea or vomiting. 10 tablet 0   traMADol  (ULTRAM ) 50 MG tablet once.     Vitamin D , Cholecalciferol, 25 MCG (1000 UT) CAPS once.     Zinc 50 MG TABS once.     No current facility-administered medications on file prior to visit.    Allergies  Allergen Reactions   Wellbutrin [Bupropion Hcl] Other (See Comments)    Reaction:  Suicidal    Nitrofurantoin Macrocrystal     Other reaction(s): Not available   Furosemide Rash    Other reaction(s): Not available    Social History   Socioeconomic History   Marital status: Widowed    Spouse name: Not on file   Number of children: 2   Years of education: Not on file   Highest education level: GED or  equivalent  Occupational History   Occupation: Un-employed due to bipolar  Tobacco Use   Smoking status: Every Day    Current packs/day: 1.00    Average packs/day: 1 pack/day for 28.5 years (28.5 ttl pk-yrs)    Types: Cigarettes    Start date: 05/18/1995   Smokeless tobacco: Never   Tobacco comments:    smoking cessation information provided in AVS  Vaping Use   Vaping status: Former   Substances: Nicotine   Substance and Sexual Activity   Alcohol use: No    Alcohol/week: 0.0 standard drinks of alcohol    Comment: Socially- seldom   Drug use: Yes    Types: Marijuana    Comment: daily   Sexual activity: Yes    Partners: Male    Birth control/protection: Surgical  Other Topics Concern   Not on file  Social History Narrative   Regular Exercise -  NO   Daily Caffeine  Use:  1 cup coffee in am      1 child, one step child      4 years ago she watched her husband die slowly before her - resulting in her having PTSD            Social Drivers of Health   Financial Resource Strain: Low Risk  (10/19/2021)   Overall Financial Resource Strain (CARDIA)    Difficulty of Paying Living Expenses: Not very hard  Food Insecurity: No Food Insecurity (10/19/2021)   Hunger Vital Sign    Worried About Running Out of Food in the Last Year: Never true    Ran Out of Food in the Last Year: Never true  Transportation Needs: No Transportation Needs (10/19/2021)   PRAPARE - Administrator, Civil Service (Medical): No    Lack of Transportation (Non-Medical): No  Physical Activity: Inactive (10/19/2021)   Exercise Vital Sign    Days of Exercise per Week: 0 days    Minutes of Exercise per Session: 0 min  Stress: Stress Concern Present (10/19/2021)   [employer] of Occupational Health - Occupational Stress Questionnaire    Feeling of Stress : To some extent  Social Connections: Moderately Isolated (10/19/2021)   Social Connection and Isolation Panel [NHANES]    Frequency of  Communication with Friends and Family: More than three times a week    Frequency of Social Gatherings with Friends and Family: More than three times a week    Attends Religious Services: More  than 4 times per year    Active Member of Clubs or Organizations: No    Attends Banker Meetings: Never    Marital Status: Widowed  Intimate Partner Violence: Not At Risk (10/19/2021)   Humiliation, Afraid, Rape, and Kick questionnaire    Fear of Current or Ex-Partner: No    Emotionally Abused: No    Physically Abused: No    Sexually Abused: No    Family History  Problem Relation Age of Onset   Sleep apnea Mother    Hypertension Mother    Kidney Stones Mother    Lymphoma Mother    Mental illness Mother    Hypertension Father    Kidney Stones Father    Diabetes Maternal Aunt    Diabetes Maternal Uncle    Diabetes Maternal Grandmother    Breast cancer Paternal Grandmother    Mental illness Cousin    Bipolar disorder Daughter     The following portions of the patient's history were reviewed and updated as appropriate: allergies, current medications, past family history, past medical history, past social history, past surgical history and problem list.  Review of Systems Pertinent items are noted in HPI.   Objective:   LMP 09/16/2015 Comment: neg preg test   Assessment/Plan:   Patient Active Problem List   Diagnosis Date Noted   Cognitive disorder 04/29/2023   PTSD (post-traumatic stress disorder) 04/29/2023   Bipolar I disorder, most recent episode mixed (HCC) 04/29/2023   Attention and concentration deficit 04/29/2023   At risk for prolonged QT interval syndrome 04/29/2023   High risk medication use 04/29/2023   History of bulimia nervosa 04/29/2023   Substance induced mood disorder (HCC) 08/08/2020   Orthostasis 08/24/2016   Antibiotic-induced yeast infection 08/22/2016   Exercise-induced tachycardia 08/22/2016   Severe episode of recurrent major depressive  disorder, without psychotic features (HCC)    GERD (gastroesophageal reflux disease) 09/16/2015   Smoker 07/25/2015   Snoring 06/09/2015   Cardiac murmur 05/25/2015   Drug-induced nausea and vomiting 03/22/2015   Cyst, bone 02/14/2015   Pelvic adhesive disease 01/21/2015   Arthritis 12/19/2014   GAD (generalized anxiety disorder) 12/13/2014   Postural dizziness 12/13/2014   Subcutaneous cyst 12/13/2014   Nephrolithiasis 05/23/2012   Chronic pelvic pain in female 05/23/2012   Fibromyalgia 05/23/2012   Bipolar disorder with depression (HCC) 06/05/2011       11/27/2023   11:52 AM 04/29/2023   10:11 AM 10/31/2021    1:19 PM  PHQ9 SCORE ONLY  PHQ-9 Total Score 26  5     Information is confidential and restricted. Go to Review Flowsheets to unlock data.       11/27/2023   11:52 AM 04/29/2023   10:12 AM 10/31/2021    1:20 PM 11/15/2020    2:53 PM  GAD 7 : Generalized Anxiety Score  Nervous, Anxious, on Edge 3  1 2   Control/stop worrying 3  1 3   Worry too much - different things 3  1 3   Trouble relaxing 3  1 3   Restless 3  1 3   Easily annoyed or irritable 3  1 1   Afraid - awful might happen 3  1 3   Total GAD 7 Score 21  7 18   Anxiety Difficulty Extremely difficult  Somewhat difficult Very difficult     Information is confidential and restricted. Go to Review Flowsheets to unlock data.       Discussed menopause and current symptoms. Discussed treatment options . Pt has history  of Anxiety, depression, bipolar 1 and 2. She is seeing a counselor regularly but does not currently see a psychiatrist. Orders place. She has a history of smoking.  She states she is trying to stop. She is now vaping. Discussed use of Effexor until she is able to completely quit smoking . She agrees to plan. She denies any contraindication to use.   Time: 20  Return to Clinic: prn   Alise Appl, CNM [facility]

## 2023-11-28 ENCOUNTER — Encounter: Payer: Self-pay | Admitting: Certified Nurse Midwife

## 2023-11-28 LAB — CORTISOL: Cortisol: 10.4 ug/dL (ref 6.2–19.4)

## 2023-11-28 LAB — ESTRADIOL: Estradiol: 49.6 pg/mL

## 2023-11-28 LAB — DHEA-SULFATE: DHEA-SO4: 79.9 ug/dL (ref 41.2–243.7)

## 2023-11-28 LAB — FOLLICLE STIMULATING HORMONE: FSH: 109 m[IU]/mL

## 2023-11-29 NOTE — Telephone Encounter (Signed)
 Patient calling in to discuss lab results. Made pt aware of Annie's message stating she is postmenopausal and that we are still waiting on some other labs to result. Patient had questions regarding her levels and the medication Ivin Marrow prescribed, questions were answered and patient is going to pick up her medication today.

## 2023-12-06 ENCOUNTER — Other Ambulatory Visit: Payer: Self-pay

## 2023-12-06 DIAGNOSIS — R5383 Other fatigue: Secondary | ICD-10-CM

## 2023-12-06 DIAGNOSIS — R635 Abnormal weight gain: Secondary | ICD-10-CM

## 2023-12-06 DIAGNOSIS — R232 Flushing: Secondary | ICD-10-CM

## 2023-12-10 ENCOUNTER — Other Ambulatory Visit

## 2023-12-10 ENCOUNTER — Telehealth: Payer: Self-pay | Admitting: Certified Nurse Midwife

## 2023-12-10 NOTE — Telephone Encounter (Signed)
 Reached out to pt to reschedule lab appt that was scheduled on 12/10/2023 at 2:40 per Ivin Marrow.  Left message for pt to call back to reschedule.

## 2023-12-11 ENCOUNTER — Encounter: Payer: Self-pay | Admitting: Certified Nurse Midwife

## 2023-12-11 NOTE — Telephone Encounter (Signed)
 Reached out to pt (2x) to reschedule lab appt that was scheduled on 12/10/2023 at 2:40 per Ivin Marrow.  Left message for pt to call back to reschedule.  Will send a MyChart letter to pt.

## 2024-03-06 DIAGNOSIS — M797 Fibromyalgia: Secondary | ICD-10-CM

## 2024-03-06 HISTORY — DX: Fibromyalgia: M79.7

## 2024-03-08 ENCOUNTER — Other Ambulatory Visit: Payer: Self-pay

## 2024-03-08 ENCOUNTER — Encounter: Payer: Self-pay | Admitting: Emergency Medicine

## 2024-03-08 ENCOUNTER — Emergency Department (EMERGENCY_DEPARTMENT_HOSPITAL)
Admission: EM | Admit: 2024-03-08 | Discharge: 2024-03-09 | Disposition: A | Discharge status: Other Acute Inpatient Hospital | Source: Home / Self Care

## 2024-03-08 DIAGNOSIS — T50904A Poisoning by unspecified drugs, medicaments and biological substances, undetermined, initial encounter: Secondary | ICD-10-CM

## 2024-03-08 DIAGNOSIS — F419 Anxiety disorder, unspecified: Secondary | ICD-10-CM | POA: Insufficient documentation

## 2024-03-08 DIAGNOSIS — G4700 Insomnia, unspecified: Secondary | ICD-10-CM | POA: Insufficient documentation

## 2024-03-08 DIAGNOSIS — F39 Unspecified mood [affective] disorder: Secondary | ICD-10-CM | POA: Insufficient documentation

## 2024-03-08 DIAGNOSIS — T426X4A Poisoning by other antiepileptic and sedative-hypnotic drugs, undetermined, initial encounter: Secondary | ICD-10-CM | POA: Insufficient documentation

## 2024-03-08 DIAGNOSIS — F319 Bipolar disorder, unspecified: Secondary | ICD-10-CM | POA: Diagnosis not present

## 2024-03-08 DIAGNOSIS — F29 Unspecified psychosis not due to a substance or known physiological condition: Secondary | ICD-10-CM | POA: Insufficient documentation

## 2024-03-08 DIAGNOSIS — Z8659 Personal history of other mental and behavioral disorders: Secondary | ICD-10-CM | POA: Insufficient documentation

## 2024-03-08 DIAGNOSIS — F4310 Post-traumatic stress disorder, unspecified: Secondary | ICD-10-CM | POA: Diagnosis not present

## 2024-03-08 DIAGNOSIS — X58XXXA Exposure to other specified factors, initial encounter: Secondary | ICD-10-CM | POA: Insufficient documentation

## 2024-03-08 DIAGNOSIS — F332 Major depressive disorder, recurrent severe without psychotic features: Secondary | ICD-10-CM | POA: Diagnosis not present

## 2024-03-08 NOTE — ED Triage Notes (Signed)
 Pt presents to the ED via [facility] after taking Ambien tonight. Pt had taken 4 5mg  of her prescription Ambien to sleep. Per EMS, [facility] did a wellness check on the patient. Of note, Pt received Narcan from [facility]. A&Ox4 at this time. Denies SI/HI, CP or SOB.

## 2024-03-08 NOTE — ED Notes (Signed)
 ED provider declining blood work at this time. Stated he will put in for psych consult since pt did take a higher dose of medication that what is prescribed.

## 2024-03-08 NOTE — BH Assessment (Signed)
 Comprehensive Clinical Assessment (CCA) Screening, Triage and Referral Note  03/08/2024 SERRIA SLOMA [id number]  Chief Complaint: Patient is a 48 year old female presenting to the ED after EMS responded to a wellness check at her residence. Patient is unsure who initiated the 911 call. Patient has a known history of bipolar disorder with depressive episodes (per chart review). She denies current suicidal ideation, homicidal ideation, or self-harm. She denies any recent psychiatric hospitalizations or acute exacerbations.  She reports taking 20 mg of Ambien tonight.  She states this is her usual dose, although it exceeds her prescribed amount.  Denies use of other substances or alcohol. Denies overdose or intentional misuse.  Visit Diagnosis: Anxiety, Insomnia  Patient Reported Information How did you hear about us ? No data recorded What Is the Reason for Your Visit/Call Today? No data recorded How Long Has This Been Causing You Problems? No data recorded What Do You Feel Would Help You the Most Today? No data recorded  Have You Recently Had Any Thoughts About Hurting Yourself? No data recorded Are You Planning to Commit Suicide/Harm Yourself At This time? No data recorded  Have you Recently Had Thoughts About Hurting Someone Sherral? No data recorded Are You Planning to Harm Someone at This Time? No data recorded Explanation: No data recorded  Have You Used Any Alcohol or Drugs in the Past 24 Hours? No data recorded How Long Ago Did You Use Drugs or Alcohol? No data recorded What Did You Use and How Much? No data recorded  Do You Currently Have a Therapist/Psychiatrist? No data recorded Name of Therapist/Psychiatrist: No data recorded  Have You Been Recently Discharged From Any Office Practice or Programs? No data recorded Explanation of Discharge From Practice/Program: No data recorded   CCA Screening Triage Referral Assessment Type of Contact: No data recorded Telemedicine Service  Delivery:   Is this Initial or Reassessment?   Date Telepsych consult ordered in [facility]:  Date Telepsych consult ordered in [facility]: 03/08/24  Time Telepsych consult ordered in [facility]:    Location of Assessment: [facility] ED  Provider Location: No data recorded   Collateral Involvement: No data recorded  Does Patient Have a Court Appointed Legal Guardian? No data recorded Name and Contact of Legal Guardian: No data recorded If Minor and Not Living with Parent(s), Who has Custody? No data recorded Is CPS involved or ever been involved? No data recorded Is APS involved or ever been involved? No data recorded  Patient Determined To Be At Risk for Harm To Self or Others Based on Review of Patient Reported Information or Presenting Complaint? No data recorded Method: No data recorded Availability of Means: No data recorded Intent: No data recorded Notification Required: No data recorded Additional Information for Danger to Others Potential: No data recorded Additional Comments for Danger to Others Potential: No data recorded Are There Guns or Other Weapons in Your Home? No data recorded Types of Guns/Weapons: No data recorded Are These Weapons Safely Secured?                            No data recorded Who Could Verify You Are Able To Have These Secured: No data recorded Do You Have any Outstanding Charges, Pending Court Dates, Parole/Probation? No data recorded Contacted To Inform of Risk of Harm To Self or Others: No data recorded  Does Patient Present under Involuntary Commitment? No data recorded   [place] of Residence: No data recorded  Patient  Currently Receiving the Following Services: No data recorded  Determination of Need: No data recorded  Options For Referral: No data recorded  Disposition Recommendation per psychiatric provider: There are no psychiatric contraindications to discharge at this time  Dahna Hattabaugh C Naba Sneed, Counselor

## 2024-03-08 NOTE — ED Notes (Signed)
 ED Provider and RN to bedside. Pt denies any thoughts of SI/HI. Denies feeling sick recently. Stated that she took more ambient that prescription indicates but that she does that routinely due to hight tolerance. Pt stated that she lives alone and did not know who called 911 for wellness check. Pt sleepy at this time.

## 2024-03-08 NOTE — [facility] Counselor (Signed)
 [facility] tele psych consult requested.

## 2024-03-08 NOTE — ED Provider Notes (Signed)
 [facility] Provider Note    Event Date/Time   First MD Initiated Contact with Patient 03/08/24 2143     (approximate)   History   Wellness check   HPI  Casey Hodges is a 48 y.o. female presents to the emergency department today after EMS was called to her house for a wellness check.  Patient is not sure who called 911.  She states that she took 20 mg of Ambien tonight.  She says this is a normal amount that she takes although it is more than has been prescribed to her.  Patient denies any attempt to harm herself.  Per chart review the patient does have history of bipolar disorder with depression.  She denies any recent illness.     Physical Exam   Triage Vital Signs: ED Triage Vitals  Encounter Vitals Group     BP 03/08/24 2154 98/65     Girls Systolic BP Percentile --      Girls Diastolic BP Percentile --      Boys Systolic BP Percentile --      Boys Diastolic BP Percentile --      Pulse Rate 03/08/24 2154 95     Resp 03/08/24 2154 15     Temp 03/08/24 2154 98.3 F (36.8 C)     Temp Source 03/08/24 2154 Oral     SpO2 03/08/24 2154 98 %     Weight 03/08/24 2208 146 lb 9.7 oz (66.5 kg)     Height 03/08/24 2208 5' 2 (1.575 m)     Head Circumference --      Peak Flow --      Pain Score 03/08/24 2208 0     Pain Loc --      Pain Education --      Exclude from Growth Chart --     Most recent vital signs: Vitals:   03/08/24 2154 03/08/24 2155  BP: 98/65   Pulse: 95   Resp: 15   Temp: 98.3 F (36.8 C) 98.3 F (36.8 C)  SpO2: 98%    General: Awake, alert, oriented. CV:  Good peripheral perfusion.  Resp:  Normal effort.  Abd:  No distention.   ED Results / Procedures / Treatments   Labs (all labs ordered are listed, but only abnormal results are displayed) Labs Reviewed - No data to display   EKG  None   RADIOLOGY None  PROCEDURES:  Critical Care performed: No    MEDICATIONS ORDERED IN ED: Medications - No data to  display   IMPRESSION / MDM / ASSESSMENT AND PLAN / ED COURSE  I reviewed the triage vital signs and the nursing notes.                              Differential diagnosis includes, but is not limited to, overdose, self harm  Patient's presentation is most consistent with acute presentation with potential threat to life or bodily function.  Patient presents to the emergency department after 911 was called to her house for a wellness check for unclear reasons.  Patient does state that she took 20 mg of Ambien tonight which she states is a normal dose that she takes although it is more than is prescribed.  She denies any SI.  Does however have a history of psychiatric illness, will have psychiatry evaluate.      FINAL CLINICAL IMPRESSION(S) / ED DIAGNOSES   Final  diagnoses:  Overdose of undetermined intent, initial encounter    Note:  This document was prepared using Dragon voice recognition software and may include unintentional dictation errors.     Floy Roberts, MD 03/08/24 [phone]

## 2024-03-09 ENCOUNTER — Encounter: Payer: Self-pay | Admitting: Psychiatry

## 2024-03-09 ENCOUNTER — Inpatient Hospital Stay
Admission: AD | Admit: 2024-03-09 | Discharge: 2024-03-14 | DRG: 885 | Disposition: A | Discharge status: Home / Self Care | Source: Intra-hospital | Attending: Psychiatry | Admitting: Psychiatry

## 2024-03-09 DIAGNOSIS — Z8659 Personal history of other mental and behavioral disorders: Secondary | ICD-10-CM

## 2024-03-09 DIAGNOSIS — Z9151 Personal history of suicidal behavior: Secondary | ICD-10-CM | POA: Diagnosis not present

## 2024-03-09 DIAGNOSIS — M797 Fibromyalgia: Secondary | ICD-10-CM | POA: Diagnosis present

## 2024-03-09 DIAGNOSIS — F39 Unspecified mood [affective] disorder: Secondary | ICD-10-CM

## 2024-03-09 DIAGNOSIS — F4310 Post-traumatic stress disorder, unspecified: Secondary | ICD-10-CM | POA: Diagnosis present

## 2024-03-09 DIAGNOSIS — F5104 Psychophysiologic insomnia: Secondary | ICD-10-CM | POA: Diagnosis present

## 2024-03-09 DIAGNOSIS — F1290 Cannabis use, unspecified, uncomplicated: Secondary | ICD-10-CM | POA: Diagnosis present

## 2024-03-09 DIAGNOSIS — F332 Major depressive disorder, recurrent severe without psychotic features: Secondary | ICD-10-CM | POA: Diagnosis present

## 2024-03-09 DIAGNOSIS — Z723 Lack of physical exercise: Secondary | ICD-10-CM

## 2024-03-09 DIAGNOSIS — Z833 Family history of diabetes mellitus: Secondary | ICD-10-CM | POA: Diagnosis not present

## 2024-03-09 DIAGNOSIS — Z8249 Family history of ischemic heart disease and other diseases of the circulatory system: Secondary | ICD-10-CM | POA: Diagnosis not present

## 2024-03-09 DIAGNOSIS — Z818 Family history of other mental and behavioral disorders: Secondary | ICD-10-CM | POA: Diagnosis not present

## 2024-03-09 DIAGNOSIS — Z803 Family history of malignant neoplasm of breast: Secondary | ICD-10-CM | POA: Diagnosis not present

## 2024-03-09 DIAGNOSIS — F411 Generalized anxiety disorder: Secondary | ICD-10-CM | POA: Diagnosis present

## 2024-03-09 DIAGNOSIS — Z90710 Acquired absence of both cervix and uterus: Secondary | ICD-10-CM | POA: Diagnosis not present

## 2024-03-09 DIAGNOSIS — Z807 Family history of other malignant neoplasms of lymphoid, hematopoietic and related tissues: Secondary | ICD-10-CM

## 2024-03-09 DIAGNOSIS — F17210 Nicotine dependence, cigarettes, uncomplicated: Secondary | ICD-10-CM | POA: Diagnosis present

## 2024-03-09 DIAGNOSIS — F319 Bipolar disorder, unspecified: Secondary | ICD-10-CM | POA: Diagnosis not present

## 2024-03-09 DIAGNOSIS — F419 Anxiety disorder, unspecified: Secondary | ICD-10-CM | POA: Diagnosis present

## 2024-03-09 LAB — COMPREHENSIVE METABOLIC PANEL WITH GFR
ALT: 11 U/L (ref 0–44)
AST: 15 U/L (ref 15–41)
Albumin: 3.9 g/dL (ref 3.5–5.0)
Alkaline Phosphatase: 58 U/L (ref 38–126)
Anion gap: 7 (ref 5–15)
BUN: 14 mg/dL (ref 6–20)
CO2: 27 mmol/L (ref 22–32)
Calcium: 8.9 mg/dL (ref 8.9–10.3)
Chloride: 107 mmol/L (ref 98–111)
Creatinine, Ser: 0.88 mg/dL (ref 0.44–1.00)
GFR, Estimated: >60 mL/min (ref >60)
Glucose, Bld: 84 mg/dL (ref 70–99)
Potassium: 4 mmol/L (ref 3.5–5.1)
Sodium: 141 mmol/L (ref 135–145)
Total Bilirubin: 0.4 mg/dL (ref 0.0–1.2)
Total Protein: 7.2 g/dL (ref 6.5–8.1)

## 2024-03-09 LAB — CBC WITH DIFFERENTIAL/PLATELET
Abs Immature Granulocytes: 0.02 K/uL (ref 0.00–0.07)
Basophils Absolute: 0.1 K/uL (ref 0.0–0.1)
Basophils Relative: 1 %
Eosinophils Absolute: 0.1 K/uL (ref 0.0–0.5)
Eosinophils Relative: 1 %
HCT: 40 % (ref 36.0–46.0)
Hemoglobin: 13.2 g/dL (ref 12.0–15.0)
Immature Granulocytes: 0 %
Lymphocytes Relative: 50 %
Lymphs Abs: 4 K/uL (ref 0.7–4.0)
MCH: 30 pg (ref 26.0–34.0)
MCHC: 33 g/dL (ref 30.0–36.0)
MCV: 90.9 fL (ref 80.0–100.0)
Monocytes Absolute: 0.4 K/uL (ref 0.1–1.0)
Monocytes Relative: 5 %
Neutro Abs: 3.5 K/uL (ref 1.7–7.7)
Neutrophils Relative %: 43 %
Platelets: 266 K/uL (ref 150–400)
RBC: 4.4 MIL/uL (ref 3.87–5.11)
RDW: 12.6 % (ref 11.5–15.5)
WBC: 8.2 K/uL (ref 4.0–10.5)
nRBC: 0 % (ref 0.0–0.2)

## 2024-03-09 LAB — URINE DRUG SCREEN, QUALITATIVE (ARMC ONLY)
Amphetamines, Ur Screen: NOT DETECTED
Barbiturates, Ur Screen: NOT DETECTED
Benzodiazepine, Ur Scrn: POSITIVE — AB
Cannabinoid 50 Ng, Ur ~~LOC~~: POSITIVE — AB
Cocaine Metabolite,Ur ~~LOC~~: NOT DETECTED
MDMA (Ecstasy)Ur Screen: NOT DETECTED
Methadone Scn, Ur: NOT DETECTED
Opiate, Ur Screen: NOT DETECTED
Phencyclidine (PCP) Ur S: NOT DETECTED
Tricyclic, Ur Screen: NOT DETECTED

## 2024-03-09 LAB — ACETAMINOPHEN LEVEL: Acetaminophen (Tylenol), Serum: <10 ug/mL — ABNORMAL LOW (ref 10–30)

## 2024-03-09 LAB — POC URINE PREG, ED: Preg Test, Ur: NEGATIVE

## 2024-03-09 LAB — SALICYLATE LEVEL: Salicylate Lvl: <7 mg/dL — ABNORMAL LOW (ref 7.0–30.0)

## 2024-03-09 MED ORDER — ACETAMINOPHEN 325 MG PO TABS
650.0000 mg | ORAL_TABLET | Freq: Four times a day (QID) | ORAL | Status: DC | PRN
  Administered 2024-03-13: 650 mg via ORAL
  Filled 2024-03-09: qty 2

## 2024-03-09 MED ORDER — HALOPERIDOL LACTATE 5 MG/ML IJ SOLN: 5.0000 mg | Freq: Three times a day (TID) | INTRAMUSCULAR | Status: DC | PRN

## 2024-03-09 MED ORDER — HALOPERIDOL LACTATE 5 MG/ML IJ SOLN: 10.0000 mg | Freq: Three times a day (TID) | INTRAMUSCULAR | Status: DC | PRN

## 2024-03-09 MED ORDER — MAGNESIUM HYDROXIDE 400 MG/5ML PO SUSP
30.0000 mL | Freq: Every day | ORAL | Status: DC | PRN
  Administered 2024-03-12 – 2024-03-14 (×2): 30 mL via ORAL
  Filled 2024-03-09 (×2): qty 30

## 2024-03-09 MED ORDER — DIPHENHYDRAMINE HCL 50 MG/ML IJ SOLN: 50.0000 mg | Freq: Three times a day (TID) | INTRAMUSCULAR | Status: DC | PRN

## 2024-03-09 MED ORDER — HALOPERIDOL 5 MG PO TABS: 5.0000 mg | ORAL_TABLET | Freq: Three times a day (TID) | ORAL | Status: DC | PRN

## 2024-03-09 MED ORDER — DIPHENHYDRAMINE HCL 25 MG PO CAPS: 50.0000 mg | ORAL_CAPSULE | Freq: Three times a day (TID) | ORAL | Status: DC | PRN

## 2024-03-09 MED ORDER — ALUM & MAG HYDROXIDE-SIMETH 200-200-20 MG/5ML PO SUSP: 30.0000 mL | ORAL | Status: DC | PRN

## 2024-03-09 MED ORDER — HYDROXYZINE HCL 25 MG PO TABS
25.0000 mg | ORAL_TABLET | Freq: Four times a day (QID) | ORAL | Status: DC | PRN
  Administered 2024-03-09 – 2024-03-13 (×11): 25 mg via ORAL
  Filled 2024-03-09 (×11): qty 1

## 2024-03-09 MED ORDER — TRAZODONE HCL 50 MG PO TABS
50.0000 mg | ORAL_TABLET | Freq: Every evening | ORAL | Status: DC | PRN
  Administered 2024-03-09 – 2024-03-13 (×5): 50 mg via ORAL
  Filled 2024-03-09 (×5): qty 1

## 2024-03-09 MED ORDER — LORAZEPAM 2 MG/ML IJ SOLN: 2.0000 mg | Freq: Three times a day (TID) | INTRAMUSCULAR | Status: DC | PRN

## 2024-03-09 NOTE — Plan of Care (Signed)
 Pt is newly admitted and requires stabilization of psychiatric symptoms.

## 2024-03-09 NOTE — ED Notes (Signed)
 Pt dressed out by this RN and EDT, [facility]. Pt cussing at this RN saying I twisted her words about the message to the boyfriend where she stated she was going to kill herself by taking pills. RN stated that if she had indeed been told the incorrect statement that she would be more than happy to put the text in her pt's chart word for word if she would like so that no words could be twisted. Pt stated it wasn't a text that it was a tic tock message. RN stated that could be put in pt's chart as well but pt declined. Pt stated,  Bitch, I don't want any help from  you.

## 2024-03-09 NOTE — ED Notes (Signed)
 Pt transferred to [facility] via wheelchair by EDT and Security. All belongings and appropriate paperwork sent with EDT. Report to [place], [place]

## 2024-03-09 NOTE — ED Notes (Signed)
 MD made aware that pt texted her boyfriend earlier today saying she was going to kill herself by taking a bunch of pill and that he wouldn't have to worry about her anymore. This was validated by pt's father who had been told that by the pt earlier today. MD is going to IVC pt. Once pt is IVCd we will dress pt out and take pt's belongings.

## 2024-03-09 NOTE — ED Notes (Signed)
 Pt. transfered to [facility] without incident. Placed in room, oriented to unit and reviewed/guidelines/rules with RN. Pt. informed that for their safety all care areas are designed for safety and monitored by security cameras at all times; and visiting hours explained to patient. Patient verbalizes understanding, and verbal contract for safety obtained.

## 2024-03-09 NOTE — ED Notes (Signed)
IVC  MOVED TO  [facility]  PENDING  CONSULT 

## 2024-03-09 NOTE — ED Notes (Signed)
Pt given Ginger ale to drink 

## 2024-03-09 NOTE — ED Provider Notes (Signed)
  Physical Exam  BP 98/65 (BP Location: Left Arm)   Pulse 95   Temp 98.3 F (36.8 C) (Oral)   Resp 15   Ht 5' 2 (1.575 m)   Wt 66.5 kg   LMP 09/16/2015 Comment: neg preg test  SpO2 98%   BMI 26.81 kg/m   Physical Exam  Procedures  Procedures  ED Course / MDM   Clinical Course as of 03/09/24 9341  [place] Mar 08, 2024  2330 S/o from Dr. Floy: - 70F brought in after well-check -- said she took 20mg  ambien to sleep (says she normally does this) - denies any SI/self harm - does have hx bipolar, depression - no c/f underlying medical problems at this time  Voluntary status  TO DO: - f/u psych recs [MM]  Mon Mar 09, 2024  0036 Patient's father's name of the emergency department.  Notes that she texted her ex-boyfriend today saying that she was going to take her life by an overdose.  Expressing concern that this was a genuine suicide attempt.  Psych evaluation pending.  Will reevaluate shortly, low threshold to initiate IVC if she attempts to leave in the meantime  Delayed late entry though family did come to the emergency department to provide collateral.  Note that patient had texted her ex partner earlier in the evening saying she was going to kill herself by overdose.  High concern for intentional overdose.  Psych evaluation pending. [MM]  0645 Labs reviewed, overall unremarkable.  UDS positive for benzodiazepines and THC. [MM]    Clinical Course User Index [MM] Clarine Ozell LABOR, MD   Medical Decision Making Amount and/or Complexity of Data Reviewed Labs: ordered.          Clarine Ozell LABOR, MD 03/09/24 [phone]

## 2024-03-09 NOTE — ED Notes (Signed)
Using phone to speak with mother. 

## 2024-03-09 NOTE — ED Notes (Addendum)
 This RN informed pt that she would need to be dressed out at this time. Pt incompliant then stated go get the clothes. If you're going to be an asshole, just go get them. Ed provider made aware of the situation.

## 2024-03-09 NOTE — Tx Team (Signed)
 Initial Treatment Plan 03/09/2024 4:31 PM Casey Hodges FMW:[id number]    PATIENT STRESSORS: Other: relationship issues     PATIENT STRENGTHS: Average or above average intelligence  Communication skills  Special hobby/interest    PATIENT IDENTIFIED PROBLEMS: Depression  Anxiety                   DISCHARGE CRITERIA:  Improved stabilization in mood, thinking, and/or behavior Verbal commitment to aftercare and medication compliance  PRELIMINARY DISCHARGE PLAN: Outpatient therapy Return to previous living arrangement  PATIENT/FAMILY INVOLVEMENT: This treatment plan has been presented to and reviewed with the patient, Casey Hodges, and/or family member, .  The patient and family have been given the opportunity to ask questions and make suggestions.  Casey DELENA Burrs, RN 03/09/2024, 4:31 PM

## 2024-03-09 NOTE — Progress Notes (Signed)
 Pt admitted to unit under IVC after reportedly attempted OD on Ambien after texting her boyfriend. She denies SI, HI & AVH. She reports she was seeking a reaction from her boyfriend but didn't want to die. She is Ox4. On approach she is anxious and hyper verbal. 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

## 2024-03-09 NOTE — BH Assessment (Signed)
 Patient is to be admitted to [facility] on today 03/09/24 by Dr. Jadapalle.  Attending Physician will be Dr. Jadapalle.    Patient has been assigned to room 322, by [facility] Charge Nurse Gigi.    ER staff is aware of the admission: Olam, ER Secretary   Dr. Willo, ER MD  Arletta, Patient's Nurse  Danaiyzha, Patient Access.
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
## 2024-03-09 NOTE — ED Notes (Signed)
 Pt up to restroom.

## 2024-03-09 NOTE — ED Notes (Signed)
 Mom called this RN stating that pt was calling and texting people trying to get someone to come pick her up.

## 2024-03-09 NOTE — ED Notes (Signed)
 Hospital meal provided.  100% consumed, pt tolerated w/o complaints.  Waste discarded appropriately.

## 2024-03-09 NOTE — ED Notes (Signed)
 IVC PENDING  CONSULT ?

## 2024-03-10 ENCOUNTER — Encounter: Payer: Self-pay | Admitting: Psychiatry

## 2024-03-10 DIAGNOSIS — E7800 Pure hypercholesterolemia, unspecified: Secondary | ICD-10-CM | POA: Insufficient documentation

## 2024-03-10 DIAGNOSIS — F411 Generalized anxiety disorder: Secondary | ICD-10-CM

## 2024-03-10 DIAGNOSIS — F4310 Post-traumatic stress disorder, unspecified: Secondary | ICD-10-CM

## 2024-03-10 DIAGNOSIS — F4320 Adjustment disorder, unspecified: Secondary | ICD-10-CM | POA: Insufficient documentation

## 2024-03-10 DIAGNOSIS — N951 Menopausal and female climacteric states: Secondary | ICD-10-CM | POA: Insufficient documentation

## 2024-03-10 DIAGNOSIS — F332 Major depressive disorder, recurrent severe without psychotic features: Principal | ICD-10-CM

## 2024-03-10 DIAGNOSIS — F909 Attention-deficit hyperactivity disorder, unspecified type: Secondary | ICD-10-CM | POA: Insufficient documentation

## 2024-03-10 DIAGNOSIS — F419 Anxiety disorder, unspecified: Secondary | ICD-10-CM | POA: Diagnosis present

## 2024-03-10 DIAGNOSIS — F5104 Psychophysiologic insomnia: Secondary | ICD-10-CM

## 2024-03-10 LAB — LIPID PANEL
Cholesterol: 181 mg/dL (ref 0–200)
HDL: 48 mg/dL (ref >40)
LDL Cholesterol: 104 mg/dL — ABNORMAL HIGH (ref 0–99)
Total CHOL/HDL Ratio: 3.8 ratio
Triglycerides: 147 mg/dL (ref <150)
VLDL: 29 mg/dL (ref 0–40)

## 2024-03-10 LAB — HEMOGLOBIN A1C
Hgb A1c MFr Bld: 4.7 % — ABNORMAL LOW (ref 4.8–5.6)
Mean Plasma Glucose: 88.19 mg/dL

## 2024-03-10 MED ORDER — CLONAZEPAM 1 MG PO TABS
1.0000 mg | ORAL_TABLET | Freq: Two times a day (BID) | ORAL | Status: DC
  Administered 2024-03-10 – 2024-03-14 (×8): 1 mg via ORAL
  Filled 2024-03-10 (×8): qty 1

## 2024-03-10 NOTE — [facility] Suicide Risk Assessment (Signed)
 [facility] Admission Suicide Risk Assessment   Nursing information obtained from:    Demographic factors:  Caucasian, Living alone Current Mental Status:  Suicidal ideation indicated by others Loss Factors:  NA Historical Factors:  Prior suicide attempts Risk Reduction Factors:  Sense of responsibility to family  Total Time spent with patient: 30 minutes Principal Problem: MDD (major depressive disorder), recurrent severe, without psychosis (HCC) Diagnosis:  Principal Problem:   MDD (major depressive disorder), recurrent severe, without psychosis (HCC) Active Problems:   GAD (generalized anxiety disorder)   PTSD (post-traumatic stress disorder)   Chronic insomnia  Subjective Data: Casey Hodges is a 48 year old female with a chart documented history of PTSD, ADHD, bipolar disorder, depression, anxiety, fibromyalgia presents to the ED after concern for overdose, patient reports she took extra Ambien to sleep, per chart review of collateral, patient texted her father and boyfriend indicating she intended to attempt suicide by overdose. Patient is admitted to adult psych unit with Q15 min safety monitoring. Multidisciplinary team approach is offered. Medication management; group/milieu therapy is offered.   Continued Clinical Symptoms:  Alcohol Use Disorder Identification Test Final Score (AUDIT): 1 The Alcohol Use Disorders Identification Test, Guidelines for Use in Primary Care, Second Edition.  World Science writer [facility]). Score between 0-7:  no or low risk or alcohol related problems. Score between 8-15:  moderate risk of alcohol related problems. Score between 16-19:  high risk of alcohol related problems. Score 20 or above:  warrants further diagnostic evaluation for alcohol dependence and treatment.   CLINICAL FACTORS:   Depression:   Impulsivity   Musculoskeletal: Strength & Muscle Tone: within normal limits Gait & Station: normal Patient leans: N/A  Psychiatric Specialty  Exam:  Presentation  General Appearance: No data recorded Eye Contact:No data recorded Speech:No data recorded Speech Volume:No data recorded Handedness:No data recorded  Mood and Affect  Mood:No data recorded Affect:No data recorded  Thought Process  Thought Processes:No data recorded Descriptions of Associations:No data recorded Orientation:No data recorded Thought Content:No data recorded History of Schizophrenia/Schizoaffective disorder:No data recorded Duration of Psychotic Symptoms:No data recorded Hallucinations:No data recorded Ideas of Reference:No data recorded Suicidal Thoughts:No data recorded Homicidal Thoughts:No data recorded  Sensorium  Memory:No data recorded Judgment:No data recorded Insight:No data recorded  Executive Functions  Concentration:No data recorded Attention Span:No data recorded Recall:No data recorded Fund of Knowledge:No data recorded Language:No data recorded  Psychomotor Activity  Psychomotor Activity:No data recorded  Assets  Assets:No data recorded  Sleep  Sleep:No data recorded   Physical Exam: Physical Exam ROS Blood pressure 93/64, pulse 81, temperature 98.1 F (36.7 C), temperature source Oral, resp. rate 16, height 5' 2 (1.575 m), weight 66.4 kg, last menstrual period 09/16/2015, SpO2 100%. Body mass index is 26.77 kg/m.   COGNITIVE FEATURES THAT CONTRIBUTE TO RISK:  None    SUICIDE RISK:   Mild:  Suicidal ideation of limited frequency, intensity, duration, and specificity.  There are no identifiable plans, no associated intent, mild dysphoria and related symptoms, good self-control (both objective and subjective assessment), few other risk factors, and identifiable protective factors, including available and accessible social support.  PLAN OF CARE: Patient is admitted to adult psych unit with Q15 min safety monitoring. Multidisciplinary team approach is offered. Medication management; group/milieu therapy is  offered.   I certify that inpatient services furnished can reasonably be expected to improve the patient's condition.   Allyn Foil, MD 03/10/2024, 10:39 PM

## 2024-03-10 NOTE — Group Note (Signed)
 Date:  03/10/2024 Time:  8:57 PM  Group Topic/Focus:  Spirituality:   The focus of this group is to discuss how one's spirituality can aide in recovery.    Participation Level:  Active  Participation Quality:  Appropriate, Attentive, and Sharing  Affect:  Appropriate  Cognitive:  Alert and Appropriate  Insight: Appropriate  Engagement in Group:  Engaged and Improving  Modes of Intervention:  Discussion, Education, Problem-solving, Rapport Building, Dance movement psychotherapist, and Support  Additional Comments:     Casey Hodges 03/10/2024, 8:57 PM
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
## 2024-03-10 NOTE — Progress Notes (Signed)
   03/10/24 0900  Psych Admission Type (Psych Patients Only)  Admission Status Involuntary  Psychosocial Assessment  Patient Complaints Anxiety  Eye Contact Fair  Facial Expression Anxious  Affect Flat  Speech Logical/coherent  Interaction Other (Comment) (WNL)  Motor Activity Fidgety  Appearance/Hygiene Unremarkable  Behavior Characteristics Anxious  Mood Anxious  Thought Process  Coherency WDL  Content WDL  Delusions WDL  Perception WDL  Hallucination None reported or observed  Judgment WDL  Confusion WDL  Danger to Self  Current suicidal ideation? Denies

## 2024-03-10 NOTE — Group Note (Signed)
 Recreation Therapy Group Note   Group Topic:Emotion Expression  Group Date: 03/10/2024 Start Time: 1000 End Time: 1055 Facilitators: Celestia Jeoffrey BRAVO, LRT, CTRS Location: Craft Room  Group Description: Painting a [employer] operational officer. Patients and LRT discuss what it means to be "at peace", what it feels like physically and mentally. Pts are given a canvas and watercolor paint to use and encouraged to draw their idea of a peaceful place. Pts and LRT discuss how they use this in their daily life post discharge. Pts are encouraged to take their canvas home with them as a reminder to find their peaceful place whenever they are feeling depressed, anxious, etc.    Goal Area(s) Addressed:  Patient will identify what it means to experience a "peaceful" emotion. Patient will identify a new coping skill.  Patient will express their emotions through art. Patients will increase communication by talking with LRT and peers while in group.   Affect/Mood: Appropriate   Participation Level: Active and Engaged   Participation Quality: Independent   Behavior: Appropriate, Calm, and Cooperative   Speech/Thought Process: Coherent   Insight: Fair   Judgement: Good   Modes of Intervention: Art   Patient Response to Interventions:  Attentive, Engaged, and Receptive   Education Outcome:  Acknowledges education   Clinical Observations/Individualized Feedback: Casey Hodges was active in their participation of session activities and group discussion. Pt identified being with my daughter as her peaceful place. Pt interacted well with LRT and peers duration of session.    Plan: Continue to engage patient in RT group sessions 2-3x/week.   Jeoffrey BRAVO Celestia, LRT, CTRS 03/10/2024 12:54 PM
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
## 2024-03-10 NOTE — Progress Notes (Signed)
   03/09/24 2000  Psych Admission Type (Psych Patients Only)  Admission Status Involuntary  Psychosocial Assessment  Patient Complaints Anxiety  Eye Contact Fair  Facial Expression Anxious  Affect Flat  Speech Logical/coherent  Interaction Assertive  Motor Activity Fidgety  Appearance/Hygiene Unremarkable  Behavior Characteristics Anxious  Mood Anxious  Thought Process  Coherency WDL  Content WDL  Delusions WDL  Perception WDL  Hallucination None reported or observed  Judgment WDL  Confusion WDL  Danger to Self  Current suicidal ideation? Denies

## 2024-03-10 NOTE — Group Note (Signed)
 Date:  03/10/2024 Time:  1:55 PM  Group Topic/Focus:  Making Healthy Choices:   The focus of this group is to help patients identify negative/unhealthy choices they were using prior to admission and identify positive/healthier coping strategies to replace them upon discharge.    Participation Level:  Active  Participation Quality:  Appropriate  Affect:  Appropriate  Cognitive:  Appropriate  Insight: Appropriate  Engagement in Group:  Engaged  Modes of Intervention:  Activity  Additional Comments:    Casey Hodges [name] 03/10/2024, 1:55 PM

## 2024-03-10 NOTE — Plan of Care (Signed)
  Problem: Education: Goal: Emotional status will improve 03/10/2024 0336 by Joshua Clarita BRAVO, RN Outcome: Progressing 03/09/2024 2232 by Joshua Clarita BRAVO, RN Outcome: Progressing Goal: Mental status will improve 03/10/2024 0336 by Joshua Clarita BRAVO, RN Outcome: Progressing 03/09/2024 2232 by Joshua Clarita BRAVO, RN Outcome: Progressing Goal: Verbalization of understanding the information provided will improve Outcome: Progressing

## 2024-03-10 NOTE — Plan of Care (Signed)
   Problem: Education: Goal: Emotional status will improve Outcome: Progressing

## 2024-03-10 NOTE — Group Note (Signed)
 Date:  03/10/2024 Time:  5:41 PM  Group Topic/Focus:  Wellness Toolbox:   The focus of this group is to discuss various aspects of wellness, balancing those aspects and exploring ways to increase the ability to experience wellness.  Patients will create a wellness toolbox for use upon discharge.    Participation Level:  Active  Participation Quality:  Appropriate  Affect:  Appropriate  Cognitive:  Appropriate  Insight: Appropriate  Engagement in Group:  Engaged  Modes of Intervention:  Activity and Socialization  Additional Comments:    Casey Hodges 03/10/2024, 5:41 PM

## 2024-03-11 NOTE — Group Note (Signed)
 Date:  03/11/2024 Time:  8:40 PM  Group Topic/Focus:  Wrap-Up Group:   The focus of this group is to help patients review their daily goal of treatment and discuss progress on daily workbooks.    Participation Level:  Active  Participation Quality:  Appropriate and Attentive  Affect:  Appropriate  Cognitive:  Appropriate  Insight: Appropriate and Good  Engagement in Group:  Supportive  Modes of Intervention:  Discussion  Additional Comments:     Kerri Katz 03/11/2024, 8:40 PM

## 2024-03-11 NOTE — Group Note (Signed)
 Date:  03/11/2024 Time:  3:08 PM  Group Topic/Focus:  Building Self Esteem:   The Focus of this group is helping patients become aware of the effects of self-esteem on their lives, the things they and others do that enhance or undermine their self-esteem, seeing the relationship between their level of self-esteem and the choices they make and learning ways to enhance self-esteem.    Participation Level:  Active  Participation Quality:  Appropriate  Affect:  Appropriate  Cognitive:  Appropriate  Insight: Appropriate  Engagement in Group:  Engaged  Modes of Intervention:  Activity  Additional Comments:    Casey Hodges Morgan Rennert 03/11/2024, 3:08 PM

## 2024-03-11 NOTE — Progress Notes (Signed)
   03/11/24 0931  Psych Admission Type (Psych Patients Only)  Admission Status Involuntary  Psychosocial Assessment  Patient Complaints Anxiety  Eye Contact Brief  Facial Expression Flat;Sullen  Affect Sullen;Flat  Speech Logical/coherent  Interaction Assertive  Motor Activity Slow  Appearance/Hygiene Unremarkable  Behavior Characteristics Anxious  Mood Anxious  Thought Process  Coherency WDL  Content WDL  Delusions WDL  Perception WDL  Hallucination None reported or observed  Judgment WDL  Confusion None  Danger to Self  Current suicidal ideation? Denies  Danger to Others  Danger to Others None reported or observed

## 2024-03-11 NOTE — Group Note (Signed)
 [facility] LCSW Group Therapy Note   Group Date: 03/11/2024 Start Time: 1300 End Time: 1415   Type of Therapy/Topic:  Group Therapy:  Emotion Regulation  Participation Level:  Active    Description of Group:    The purpose of this group is to assist patients in learning to regulate negative emotions and experience positive emotions. Patients will be guided to discuss ways in which they have been vulnerable to their negative emotions. These vulnerabilities will be juxtaposed with experiences of positive emotions or situations, and patients challenged to use positive emotions to combat negative ones. Special emphasis will be placed on coping with negative emotions in conflict situations, and patients will process healthy conflict resolution skills.  Therapeutic Goals: Patient will identify two positive emotions or experiences to reflect on in order to balance out negative emotions:  Patient will label two or more emotions that they find the most difficult to experience:  Patient will be able to demonstrate positive conflict resolution skills through discussion or role plays:   Summary of Patient Progress: Patient was present for the entirety of the group process. She was involved in the discussion. Pt appeared open and receptive to feedback/comments from both her peers and the facilitator.    Therapeutic Modalities:   Cognitive Behavioral Therapy Feelings Identification Dialectical Behavioral Therapy   Nadara JONELLE Fam, LCSW

## 2024-03-11 NOTE — Group Note (Signed)
 Date:  03/11/2024 Time:  7:09 PM  Group Topic/Focus:  Wellness Toolbox:   The focus of this group is to discuss various aspects of wellness, balancing those aspects and exploring ways to increase the ability to experience wellness.  Patients will create a wellness toolbox for use upon discharge.    Participation Level:  Active  Participation Quality:  Appropriate  Affect:  Appropriate  Cognitive:  Appropriate  Insight: Appropriate  Engagement in Group:  Engaged  Modes of Intervention:  Activity and Socialization  Additional Comments:    Deitra Caron Mainland 03/11/2024, 7:09 PM

## 2024-03-11 NOTE — Progress Notes (Signed)
 [facility] MD Progress Note  03/11/2024 3:21 PM Casey Hodges  MRN:  [id number] "Casey Hodges is a 48 y.o. female presents to the emergency department today after EMS was called to her house for a wellness check.  Patient is not sure who called 911.  She states that she took 20 mg of Ambien tonight.  She says this is a normal amount that she takes although it is more than has been prescribed to her.  Patient denies any attempt to harm herself.  Per chart review the patient does have history of bipolar disorder with depression.  She denies any recent illness". Patient is admitted to adult psych unit with Q15 min safety monitoring. Multidisciplinary team approach is offered. Medication management; group/milieu therapy is offered.   Subjective:  Chart reviewed, case discussed in multidisciplinary meeting, patient seen during rounds.  On interview today patient met with the treatment team and continues to remain focused on discharge planning.  She continues to minimize her presentation but eventually was able to tell that the text to the boyfriend's probability a little manipulation as she was upset with him and wanted him to know that she was upset.  She initially states that she broke up with her boyfriend but then agreed with the treatment team that she is talking to him every day even on the inpatient unit.  She consistently denies SI/HI/plan and denies hallucinations.  She talks about her mom needing her for wound care and her daughter wanting her home.  Treatment team informed her about the need of reaching out to her daughter or her father for safety information as they both have been involved in the incident that led up to the ED visit.  Patient gave consent for the team to reach out to either her father or her daughter.  She denies auditory/visual hallucinations.  She has fair appetite and sleep.  She is participating in groups.   Sleep: Fair  Appetite:  Fair  Past Psychiatric History: see h&P Family  History:  Family History  Problem Relation Age of Onset   Sleep apnea Mother    Hypertension Mother    Kidney Stones Mother    Lymphoma Mother    Mental illness Mother    Hypertension Father    Kidney Stones Father    Diabetes Maternal Aunt    Diabetes Maternal Uncle    Diabetes Maternal Grandmother    Breast cancer Paternal Grandmother    Mental illness Cousin    Bipolar disorder Daughter    Social History:  Social History   Substance and Sexual Activity  Alcohol Use No   Alcohol/week: 0.0 standard drinks of alcohol   Comment: Socially- seldom     Social History   Substance and Sexual Activity  Drug Use Yes   Types: Marijuana   Comment: daily    Social History   Socioeconomic History   Marital status: Widowed    Spouse name: Not on file   Number of children: 2   Years of education: Not on file   Highest education level: GED or equivalent  Occupational History   Occupation: Un-employed due to bipolar  Tobacco Use   Smoking status: Every Day    Current packs/day: 1.00    Average packs/day: 1 pack/day for 28.8 years (28.8 ttl pk-yrs)    Types: Cigarettes    Start date: 05/18/1995   Smokeless tobacco: Never   Tobacco comments:    smoking cessation information provided in AVS  Vaping Use  Vaping status: Former   Substances: Nicotine   Substance and Sexual Activity   Alcohol use: No    Alcohol/week: 0.0 standard drinks of alcohol    Comment: Socially- seldom   Drug use: Yes    Types: Marijuana    Comment: daily   Sexual activity: Yes    Partners: Male    Birth control/protection: Surgical  Other Topics Concern   Not on file  Social History Narrative   Regular Exercise -  NO   Daily Caffeine  Use:  1 cup coffee in am      1 child, one step child      4 years ago she watched her husband die slowly before her - resulting in her having PTSD            Social Drivers of Health   Financial Resource Strain: Low Risk  (10/19/2021)   Overall Financial  Resource Strain (CARDIA)    Difficulty of Paying Living Expenses: Not very hard  Food Insecurity: No Food Insecurity (03/09/2024)   Hunger Vital Sign    Worried About Running Out of Food in the Last Year: Never true    Ran Out of Food in the Last Year: Never true  Transportation Needs: No Transportation Needs (03/09/2024)   PRAPARE - Administrator, Civil Service (Medical): No    Lack of Transportation (Non-Medical): No  Physical Activity: Inactive (10/19/2021)   Exercise Vital Sign    Days of Exercise per Week: 0 days    Minutes of Exercise per Session: 0 min  Stress: Stress Concern Present (10/19/2021)   [employer] of Occupational Health - Occupational Stress Questionnaire    Feeling of Stress : To some extent  Social Connections: Moderately Isolated (10/19/2021)   Social Connection and Isolation Panel    Frequency of Communication with Friends and Family: More than three times a week    Frequency of Social Gatherings with Friends and Family: More than three times a week    Attends Religious Services: More than 4 times per year    Active Member of [employer] or Organizations: No    Attends Banker Meetings: Never    Marital Status: Widowed   Past Medical History:  Past Medical History:  Diagnosis Date   Abnormal vaginal Pap smear    10+ years ago- no colpo repeat was normal   ADHD    Antibiotic-induced yeast infection 08/22/2016   Anxiety    AR (allergic rhinitis)    Bipolar affective (HCC)    pt reported   Bipolar disorder (HCC)    Bursitis of both hips    Calcium , deposits, in bursa    left hip   Eating disorder    Under control per patient   Fibromyalgia 03/06/2024   GERD (gastroesophageal reflux disease)    History of bulimia nervosa 04/29/2023   Nephrolithiasis 05/23/2012   Painful intercourse    Painful menstrual periods    Pelvic pain in female    PTSD (post-traumatic stress disorder)    Renal disorder    Spinal stenosis    Uterine  polyp    VWD (acquired von Willebrand's disease) (HCC)     Past Surgical History:  Procedure Laterality Date   ABDOMINAL HYSTERECTOMY     CYSTOSCOPY WITH STENT PLACEMENT Left 04/19/2017   Procedure: CYSTOSCOPY WITH STENT PLACEMENT;  Surgeon: Carolee Sherwood JONETTA DOUGLAS, MD;  Location: [facility];  Service: Urology;  Laterality: Left;   HYSTEROSCOPY     removed polyps  LAPAROSCOPIC VAGINAL HYSTERECTOMY  2015   at [facility]   LAPAROSCOPY Left 01/10/2015   Procedure: LAPAROSCOPY OPERATIVE with biopsy, left oopherectomy;  Surgeon: Gladis DELENA Dollar, MD;  Location: [facility];  Service: Gynecology;  Laterality: Left;   LAPAROSCOPY ABDOMEN DIAGNOSTIC     OOPHORECTOMY Left    TUBAL LIGATION     URETEROSCOPY WITH HOLMIUM LASER LITHOTRIPSY Left 04/19/2017   Procedure: URETEROSCOPY WITH HOLMIUM LASER LITHOTRIPSY;  Surgeon: Carolee Sherwood JONETTA DOUGLAS, MD;  Location: [facility];  Service: Urology;  Laterality: Left;    Current Medications: Current Facility-Administered Medications  Medication Dose Route Frequency Provider Last Rate Last Admin   acetaminophen  (TYLENOL ) tablet 650 mg  650 mg Oral Q6H PRN Donnelly Mellow, MD       alum & mag hydroxide-simeth (MAALOX/MYLANTA) 200-200-20 MG/5ML suspension 30 mL  30 mL Oral Q4H PRN Georgie Eduardo, MD       clonazePAM  (KLONOPIN ) tablet 1 mg  1 mg Oral BID Trayvion Embleton, MD   1 mg at 03/11/24 9162   haloperidol  (HALDOL ) tablet 5 mg  5 mg Oral TID PRN Svea Pusch, MD       And   diphenhydrAMINE  (BENADRYL ) capsule 50 mg  50 mg Oral TID PRN Garald Rhew, MD       haloperidol  lactate (HALDOL ) injection 5 mg  5 mg Intramuscular TID PRN Donnelly Mellow, MD       And   diphenhydrAMINE  (BENADRYL ) injection 50 mg  50 mg Intramuscular TID PRN Donnelly Mellow, MD       And   LORazepam  (ATIVAN ) injection 2 mg  2 mg Intramuscular TID PRN Tasheika Kitzmiller, MD       haloperidol  lactate (HALDOL ) injection 10 mg  10 mg Intramuscular TID PRN Donnelly Mellow, MD       And    diphenhydrAMINE  (BENADRYL ) injection 50 mg  50 mg Intramuscular TID PRN Donnelly Mellow, MD       And   LORazepam  (ATIVAN ) injection 2 mg  2 mg Intramuscular TID PRN Chastin Garlitz, MD       hydrOXYzine  (ATARAX ) tablet 25 mg  25 mg Oral Q6H PRN Jawon Dipiero, MD   25 mg at 03/11/24 1432   magnesium  hydroxide (MILK OF MAGNESIA) suspension 30 mL  30 mL Oral Daily PRN Donnelly Mellow, MD       traZODone  (DESYREL ) tablet 50 mg  50 mg Oral QHS PRN Marne Meline, MD   50 mg at 03/10/24 2112    Lab Results:  Results for orders placed or performed during the hospital encounter of 03/09/24 (from the past 48 hours)  Hemoglobin A1c     Status: Abnormal   Collection Time: 03/10/24  5:39 AM  Result Value Ref Range   Hgb A1c MFr Bld 4.7 (L) 4.8 - 5.6 %    Comment: (NOTE) Diagnosis of Diabetes The following HbA1c ranges recommended by the American Diabetes Association (ADA) may be used as an aid in the diagnosis of diabetes mellitus.  Hemoglobin             Suggested A1C NGSP%              Diagnosis  <5.7                   Non Diabetic  5.7-6.4                Pre-Diabetic  >6.4  Diabetic  <7.0                   Glycemic control for                       adults with diabetes.     Mean Plasma Glucose 88.19 mg/dL    Comment: Performed at [facility], [address] [place], [place] [address]  Lipid panel     Status: Abnormal   Collection Time: 03/10/24  6:39 AM  Result Value Ref Range   Cholesterol 181 0 - 200 mg/dL   Triglycerides 852 <849 mg/dL   HDL 48 >59 mg/dL   Total CHOL/HDL Ratio 3.8 RATIO   VLDL 29 0 - 40 mg/dL   LDL Cholesterol 895 (H) 0 - 99 mg/dL    Comment:        Total Cholesterol/HDL:CHD Risk Coronary Heart Disease Risk Table                     Men   Women  1/2 Average Risk   3.4   3.3  Average Risk       5.0   4.4  2 X Average Risk   9.6   7.1  3 X Average Risk  23.4   11.0        Use the calculated Patient Ratio above and the CHD  Risk Table to determine the patient's CHD Risk.        ATP III CLASSIFICATION (LDL):  <100     mg/dL   Optimal  899-870  mg/dL   Near or Above                    Optimal  130-159  mg/dL   Borderline  839-810  mg/dL   High  >809     mg/dL   Very High Performed at [facility], [address] [place], [place] [address]     Blood Alcohol level:  Lab Results  Component Value Date   ETH 88 (H) 08/07/2020    Metabolic Disorder Labs: Lab Results  Component Value Date   HGBA1C 4.7 (L) 03/10/2024   MPG 88.19 03/10/2024   No results found for: PROLACTIN Lab Results  Component Value Date   CHOL 181 03/10/2024   TRIG 147 03/10/2024   HDL 48 03/10/2024   CHOLHDL 3.8 03/10/2024   VLDL 29 03/10/2024   LDLCALC 104 (H) 03/10/2024   LDLCALC 92 03/06/2016    Physical Findings: AIMS:  , ,  ,  ,    CIWA:    COWS:      Psychiatric Specialty Exam:  Presentation  General Appearance: Appropriate for Environment; Casual  Eye Contact:Fair  Speech:Clear and Coherent  Speech Volume:Normal    Mood and Affect  Mood:Anxious  Affect:Appropriate   Thought Process  Thought Processes:Coherent  Descriptions of Associations:Intact  Orientation:Full (Time, Place and Person)  Thought Content:Logical  Hallucinations:Hallucinations: None  Ideas of Reference:None  Suicidal Thoughts:Suicidal Thoughts: No  Homicidal Thoughts:Homicidal Thoughts: No   Sensorium  Memory:Immediate Fair; Recent Fair; Remote Fair  Judgment:Impaired  Insight:Shallow   Executive Functions  Concentration:Fair  Attention Span:Poor  Recall:Fair  Fund of Knowledge:Fair  Language:Fair   Psychomotor Activity  Psychomotor Activity:Psychomotor Activity: Normal  Musculoskeletal: Strength & Muscle Tone: within normal limits Gait & Station: normal Assets  Assets:Communication Skills; Desire for Improvement; Physical Health    Physical Exam: Physical Exam ROS Blood  pressure (!) 87/60, pulse 80, temperature 98.1 F (36.7 C), temperature source Oral, resp. rate 20, height 5' 2 (1.575 m), weight 66.4 kg, last menstrual period 09/16/2015, SpO2 100%. Body mass index is 26.77 kg/m.  Diagnosis: Principal Problem:   MDD (major depressive disorder), recurrent severe, without psychosis (HCC) Active Problems:   GAD (generalized anxiety disorder)   PTSD (post-traumatic stress disorder)   Chronic insomnia    Clinical Decision Making: Patient currently admitted for possible overdose on Ambien in the context of psychosocial stressors of conflict with her boyfriend.  Even though patient denies SI/HI/plan discomforting information in ED documentation about patient sending text messages of overdose being a suicide attempt to her boyfriend and her father.  At this time patient will be monitored closely for further safety concerns.  Patient is declining any psychotropic medications at this time.  Treatment Plan Summary:  Safety and Monitoring: --Voluntary admission to inpatient psychiatric unit for safety, stabilization and treatment             -- Daily contact with patient to assess and evaluate symptoms and progress in treatment             -- Patient's case to be discussed in multi-disciplinary team meeting             -- Observation Level: q15 minute checks             -- Vital signs:  q12 hours             -- Precautions: suicide, elopement, and assault   2. Psychiatric Diagnoses and Treatment: Patient is declining any psychotropic medications for mood stabilization at this time.  Patient is not meeting criteria for nonemergent forced medication.  Patient wants to continue on Klonopin  and do weekly therapy sessions with her therapist of 9 years             -- Will continue Klonopin  1mg  twice a day  -- Ambien 5mg      -- The risks/benefits/side-effects/alternatives to this medication were discussed in detail with the patient and time was given for questions. The  patient consents to medication trial.                -- Metabolic profile and EKG monitoring obtained while on an atypical antipsychotic (BMI: Lipid Panel: HbgA1c: QTc:)              -- Encouraged patient to participate in unit milieu and in scheduled group therapies                            3. Medical Issues Being Addressed:       4. Discharge Planning:   -- Social work and case management to assist with discharge planning and identification of hospital follow-up needs prior to discharge  -- Estimated LOS: 3-4 days  Jodel Mayhall, MD 03/11/2024, 3:21 PM

## 2024-03-11 NOTE — Plan of Care (Signed)
  Problem: Education: Goal: Knowledge of  General Education information/materials will improve Outcome: Progressing Goal: Emotional status will improve Outcome: Progressing Goal: Mental status will improve Outcome: Progressing Goal: Verbalization of understanding the information provided will improve Outcome: Progressing   Problem: Activity: Goal: Interest or engagement in activities will improve Outcome: Progressing Goal: Sleeping patterns will improve Outcome: Progressing   Problem: Coping: Goal: Ability to verbalize frustrations and anger appropriately will improve Outcome: Progressing Goal: Ability to demonstrate self-control will improve Outcome: Progressing   Problem: Health Behavior/Discharge Planning: Goal: Identification of resources available to assist in meeting health care needs will improve Outcome: Progressing Goal: Compliance with treatment plan for underlying cause of condition will improve Outcome: Progressing   Problem: Physical Regulation: Goal: Ability to maintain clinical measurements within normal limits will improve Outcome: Progressing   Problem: Safety: Goal: Periods of time without injury will increase Outcome: Progressing   Problem: Activity: Goal: Will identify at least one activity in which they can participate Outcome: Progressing   Problem: Coping: Goal: Ability to identify and develop effective coping behavior will improve Outcome: Progressing Goal: Ability to interact with others will improve Outcome: Progressing Goal: Demonstration of participation in decision-making regarding own care will improve Outcome: Progressing Goal: Ability to use eye contact when communicating with others will improve Outcome: Progressing   Problem: Health Behavior/Discharge Planning: Goal: Identification of resources available to assist in meeting health care needs will improve Outcome: Progressing   Problem: Self-Concept: Goal: Will  verbalize positive feelings about self Outcome: Progressing   

## 2024-03-11 NOTE — BH IP Treatment Plan (Signed)
 Interdisciplinary Treatment and Diagnostic Plan Update  03/11/2024 Time of Session: 10:22 AM Casey Hodges MRN: [id number]  Principal Diagnosis: MDD (major depressive disorder), recurrent severe, without psychosis (HCC)  Secondary Diagnoses: Principal Problem:   MDD (major depressive disorder), recurrent severe, without psychosis (HCC) Active Problems:   GAD (generalized anxiety disorder)   PTSD (post-traumatic stress disorder)   Chronic insomnia   Current Medications:  Current Facility-Administered Medications  Medication Dose Route Frequency Provider Last Rate Last Admin   acetaminophen  (TYLENOL ) tablet 650 mg  650 mg Oral Q6H PRN Jadapalle, Sree, MD       alum & mag hydroxide-simeth (MAALOX/MYLANTA) 200-200-20 MG/5ML suspension 30 mL  30 mL Oral Q4H PRN Donnelly Mellow, MD       clonazePAM  (KLONOPIN ) tablet 1 mg  1 mg Oral BID Jadapalle, Sree, MD   1 mg at 03/11/24 9162   haloperidol  (HALDOL ) tablet 5 mg  5 mg Oral TID PRN Jadapalle, Sree, MD       And   diphenhydrAMINE  (BENADRYL ) capsule 50 mg  50 mg Oral TID PRN Jadapalle, Sree, MD       haloperidol  lactate (HALDOL ) injection 5 mg  5 mg Intramuscular TID PRN Jadapalle, Sree, MD       And   diphenhydrAMINE  (BENADRYL ) injection 50 mg  50 mg Intramuscular TID PRN Jadapalle, Sree, MD       And   LORazepam  (ATIVAN ) injection 2 mg  2 mg Intramuscular TID PRN Jadapalle, Sree, MD       haloperidol  lactate (HALDOL ) injection 10 mg  10 mg Intramuscular TID PRN Jadapalle, Sree, MD       And   diphenhydrAMINE  (BENADRYL ) injection 50 mg  50 mg Intramuscular TID PRN Jadapalle, Sree, MD       And   LORazepam  (ATIVAN ) injection 2 mg  2 mg Intramuscular TID PRN Jadapalle, Sree, MD       hydrOXYzine  (ATARAX ) tablet 25 mg  25 mg Oral Q6H PRN Jadapalle, Sree, MD   25 mg at 03/11/24 9162   magnesium  hydroxide (MILK OF MAGNESIA) suspension 30 mL  30 mL Oral Daily PRN Donnelly Mellow, MD       traZODone  (DESYREL ) tablet 50 mg  50 mg Oral QHS PRN  Jadapalle, Sree, MD   50 mg at 03/10/24 2112   PTA Medications: Medications Prior to Admission  Medication Sig Dispense Refill Last Dose/Taking   clonazePAM  (KLONOPIN ) 1 MG tablet Take 0.5-1 tablets (0.5-1 mg total) by mouth daily as needed for anxiety. Weaning off (Patient taking differently: Take 1 mg by mouth 2 (two) times daily. Weaning off)   Past Week   diazepam (VALIUM) 5 MG tablet Take 5 mg by mouth 2 (two) times daily.   Taking   fluticasone  (FLONASE ) 50 MCG/ACT nasal spray Place 1 spray into both nostrils as needed for allergies.      ibuprofen  (ADVIL ) 800 MG tablet TAKE 1 TABLET BY MOUTH EVERY 8 HOURS AS NEEDED FOR MODERATE PAIN. 90 tablet 1    magnesium  oxide (MAG-OX) 400 MG tablet Take 400 mg by mouth daily.      omega-3 acid ethyl esters (LOVAZA) 1 g capsule 2 (two) times daily at 10 AM and 5 PM.      ondansetron  (ZOFRAN ) 4 MG tablet Take 1 tablet (4 mg total) by mouth every 8 (eight) hours as needed for up to 10 doses for nausea or vomiting. 10 tablet 0    venlafaxine  XR (EFFEXOR  XR) 37.5 MG 24 hr capsule  Take 1 capsule (37.5 mg total) by mouth daily with breakfast. (Patient not taking: Reported on 03/09/2024) 90 capsule 0    Vitamin D , Cholecalciferol, 25 MCG (1000 UT) TABS Take 1 tablet by mouth daily.      zolpidem (AMBIEN) 5 MG tablet Take 5 mg by mouth at bedtime as needed for sleep.       Patient Stressors: Other: relationship issues    Patient Strengths: Average or above average intelligence  Communication skills  Special hobby/interest   Treatment Modalities: Medication Management, Group therapy, Case management,  1 to 1 session with clinician, Psychoeducation, Recreational therapy.   Physician Treatment Plan for Primary Diagnosis: MDD (major depressive disorder), recurrent severe, without psychosis (HCC) Long Term Goal(s): Improvement in symptoms so as ready for discharge   Short Term Goals: Ability to identify changes in lifestyle to reduce recurrence of  condition will improve Ability to verbalize feelings will improve Ability to disclose and discuss suicidal ideas Ability to demonstrate self-control will improve Ability to identify and develop effective coping behaviors will improve Compliance with prescribed medications will improve Ability to identify triggers associated with substance abuse/mental health issues will improve  Medication Management: Evaluate patient's response, side effects, and tolerance of medication regimen.  Therapeutic Interventions: 1 to 1 sessions, Unit Group sessions and Medication administration.  Evaluation of Outcomes: Not Met  Physician Treatment Plan for Secondary Diagnosis: Principal Problem:   MDD (major depressive disorder), recurrent severe, without psychosis (HCC) Active Problems:   GAD (generalized anxiety disorder)   PTSD (post-traumatic stress disorder)   Chronic insomnia  Long Term Goal(s): Improvement in symptoms so as ready for discharge   Short Term Goals: Ability to identify changes in lifestyle to reduce recurrence of condition will improve Ability to verbalize feelings will improve Ability to disclose and discuss suicidal ideas Ability to demonstrate self-control will improve Ability to identify and develop effective coping behaviors will improve Compliance with prescribed medications will improve Ability to identify triggers associated with substance abuse/mental health issues will improve     Medication Management: Evaluate patient's response, side effects, and tolerance of medication regimen.  Therapeutic Interventions: 1 to 1 sessions, Unit Group sessions and Medication administration.  Evaluation of Outcomes: Not Met   RN Treatment Plan for Primary Diagnosis: MDD (major depressive disorder), recurrent severe, without psychosis (HCC) Long Term Goal(s): Knowledge of disease and therapeutic regimen to maintain health will improve  Short Term Goals: Ability to verbalize  frustration and anger appropriately will improve, Ability to demonstrate self-control, Ability to participate in decision making will improve, Ability to verbalize feelings will improve, Ability to disclose and discuss suicidal ideas, and Ability to identify and develop effective coping behaviors will improve  Medication Management: RN will administer medications as ordered by provider, will assess and evaluate patient's response and provide education to patient for prescribed medication. RN will report any adverse and/or side effects to prescribing provider.  Therapeutic Interventions: 1 on 1 counseling sessions, Psychoeducation, Medication administration, Evaluate responses to treatment, Monitor vital signs and CBGs as ordered, Perform/monitor CIWA, COWS, AIMS and Fall Risk screenings as ordered, Perform wound care treatments as ordered.  Evaluation of Outcomes: Not Met   LCSW Treatment Plan for Primary Diagnosis: MDD (major depressive disorder), recurrent severe, without psychosis (HCC) Long Term Goal(s): Safe transition to appropriate next level of care at discharge, Engage patient in therapeutic group addressing interpersonal concerns.  Short Term Goals: Engage patient in aftercare planning with referrals and resources, Increase social support, Increase ability to appropriately  verbalize feelings, Increase emotional regulation, Facilitate acceptance of mental health diagnosis and concerns, Facilitate patient progression through stages of change regarding substance use diagnoses and concerns, Identify triggers associated with mental health/substance abuse issues, and Increase skills for wellness and recovery  Therapeutic Interventions: Assess for all discharge needs, 1 to 1 time with Social worker, Explore available resources and support systems, Assess for adequacy in community support network, Educate family and significant other(s) on suicide prevention, Complete Psychosocial Assessment,  Interpersonal group therapy.  Evaluation of Outcomes: Not Met   Progress in Treatment: Attending groups: Yes. Participating in groups: Yes. Taking medication as prescribed: Yes. Toleration medication: Yes. Family/Significant other contact made: No, will contact:  CSW to contact once permission is granted.  Patient understands diagnosis: Yes. Discussing patient identified problems/goals with staff: Yes. Medical problems stabilized or resolved: Yes. Denies suicidal/homicidal ideation: Yes. Issues/concerns per patient self-inventory: No. Other: None  New problem(s) identified: No, Describe:  None  New Short Term/Long Term Goal(s):detox, elimination of symptoms of psychosis, medication management for mood stabilization; elimination of SI thoughts; development of comprehensive mental wellness/sobriety plan.    Patient Goals:  I just want to do all of the activities and participate.   Discharge Plan or Barriers: CSW to assist with the development of appropriate discharge plan.    Reason for Continuation of Hospitalization: Anxiety Delusions  Depression Suicidal ideation  Estimated Length of Stay: 1-7 days.   Last 3 [place] Suicide Severity Risk Score: Flowsheet Row Admission (Current) from 03/09/2024 in [facility] ED from 03/08/2024 in [facility] Visit from 04/29/2023 in confidential department  C-SSRS RISK CATEGORY No Risk No Risk Low Risk    Last PHQ 2/9 Scores:    11/27/2023   11:52 AM 04/29/2023   10:11 AM 10/31/2021    1:19 PM  Depression screen PHQ 2/9  Decreased Interest 3 3 1   Down, Depressed, Hopeless 3 3 1   PHQ - 2 Score 6 6 2   Altered sleeping 3 3 1   Tired, decreased energy 3 3 1   Change in appetite 3 3 0  Feeling bad or failure about yourself  3 3 0  Trouble concentrating 3 3 1   Moving slowly or fidgety/restless 3 3 0  Suicidal thoughts 2 1 0  PHQ-9 Score 26 25 5   Difficult doing  work/chores Extremely dIfficult Extremely dIfficult Not difficult at all    Scribe for Treatment Team: Mariajose Mow M Saima Monterroso, LCSW 03/11/2024 11:03 AM

## 2024-03-11 NOTE — Progress Notes (Signed)
   03/11/24 0600  Psych Admission Type (Psych Patients Only)  Admission Status Involuntary  Psychosocial Assessment  Patient Complaints Anxiety  Eye Contact Fair  Facial Expression Anxious  Affect Flat  Speech Logical/coherent  Interaction Assertive  Motor Activity Fidgety  Appearance/Hygiene Unremarkable  Behavior Characteristics Anxious  Mood Anxious  Aggressive Behavior  Effect No apparent injury  Thought Process  Coherency WDL  Content WDL  Delusions WDL  Perception WDL  Hallucination None reported or observed  Judgment WDL  Confusion WDL  Danger to Self  Current suicidal ideation? Denies  Danger to Others  Danger to Others None reported or observed

## 2024-03-11 NOTE — Plan of Care (Signed)

## 2024-03-12 NOTE — [facility] Suicide Risk Assessment (Signed)
 [facility] INPATIENT:  Family/Significant Other Suicide Prevention Education  Suicide Prevention Education:  Contact Attempts: Vernell Patten, [phone], Daughter , (name of family member/significant other) has been identified by the patient as the family member/significant other with whom the patient will be residing, and identified as the person(s) who will aid the patient in the event of a mental health crisis.  With written consent from the patient, two attempts were made to provide suicide prevention education, prior to and/or following the patient's discharge.  We were unsuccessful in providing suicide prevention education.  A suicide education pamphlet was given to the patient to share with family/significant other.  Date and time of first attempt: 03/12/2024 at 10:37AM Date and time of second attempt: Second attempt is needed  CSW left HIPAA compliant voicemail.  Sherryle JINNY Margo 03/12/2024, 10:42 AM

## 2024-03-12 NOTE — Plan of Care (Signed)
   Problem: Education: Goal: Knowledge of [facility] Education information/materials will improve Outcome: Progressing Goal: Emotional status will improve Outcome: Progressing Goal: Mental status will improve Outcome: Progressing Goal: Verbalization of understanding the information provided will improve Outcome: Progressing

## 2024-03-12 NOTE — Group Note (Signed)
 [facility] LCSW Group Therapy Note   Group Date: 03/12/2024 Start Time: 1305 End Time: 1415   Type of Therapy/Topic:  Group Therapy:  Emotion Regulation  Participation Level:  Active   Mood:Appropriate   Description of Group:    The purpose of this group is to assist patients in learning to regulate negative emotions and experience positive emotions. Patients will be guided to discuss ways in which they have been vulnerable to their negative emotions. These vulnerabilities will be juxtaposed with experiences of positive emotions or situations, and patients challenged to use positive emotions to combat negative ones. Special emphasis will be placed on coping with negative emotions in conflict situations, and patients will process healthy conflict resolution skills.  Therapeutic Goals: Patient will identify two positive emotions or experiences to reflect on in order to balance out negative emotions:  Patient will label two or more emotions that they find the most difficult to experience:  Patient will be able to demonstrate positive conflict resolution skills through discussion or role plays:   Summary of Patient Progress:   During group, patient and group explored the ways in which our thoughts can impact our feelings which impacts our behaviors. Group along with facilitator completed a thermometer activity where different areas of life were explored. Participants were asked to notate in which zone these areas exist in on their personal thermometers. The group then discussed coping skills, and safety plans to help better prepare for potential stressors and learn to better emotionally regulate.     Therapeutic Modalities:   Cognitive Behavioral Therapy Feelings Identification Dialectical Behavioral Therapy   Alveta CHRISTELLA Kerns, LCSW

## 2024-03-12 NOTE — Progress Notes (Signed)
 [facility] MD Progress Note  03/12/2024 9:46 PM Casey Hodges  MRN:  [id number] "Casey Hodges is a 48 y.o. female presents to the emergency department today after EMS was called to her house for a wellness check.  Patient is not sure who called 911.  She states that she took 20 mg of Ambien tonight.  She says this is a normal amount that she takes although it is more than has been prescribed to her.  Patient denies any attempt to harm herself.  Per chart review the patient does have history of bipolar disorder with depression.  She denies any recent illness". Patient is admitted to adult psych unit with Q15 min safety monitoring. Multidisciplinary team approach is offered. Medication management; group/milieu therapy is offered.   Subjective:  Chart reviewed, case discussed in multidisciplinary meeting, patient seen during rounds.   03/12/24: On interview patient is noted to be talking to another peer.  She offers no complaints.  She continues to remain discharge focused and reports that her daughter and her mother are her great strengths.  She reports that she broke up with her ex-boyfriend and is not planning to involve him in her life.  She did acknowledge that the alleged overdose and the text message to him is to get attention from him.  She denies any SI/HI/plan and denies any auditory/visual hallucinations.  Patient reports fair appetite and sleep.  Provider discussed about the discharge planning on Saturday or Sunday depending on the collateral information from her family.  03/11/24:On interview today patient met with the treatment team and continues to remain focused on discharge planning.  She continues to minimize her presentation but eventually was able to tell that the text to the boyfriend's probability a little manipulation as she was upset with him and wanted him to know that she was upset.  She initially states that she broke up with her boyfriend but then agreed with the treatment team that she is  talking to him every day even on the inpatient unit.  She consistently denies SI/HI/plan and denies hallucinations.  She talks about her mom needing her for wound care and her daughter wanting her home.  Treatment team informed her about the need of reaching out to her daughter or her father for safety information as they both have been involved in the incident that led up to the ED visit.  Patient gave consent for the team to reach out to either her father or her daughter.  She denies auditory/visual hallucinations.  She has fair appetite and sleep.  She is participating in groups.   Sleep: Fair  Appetite:  Fair  Past Psychiatric History: see h&P Family History:  Family History  Problem Relation Age of Onset   Sleep apnea Mother    Hypertension Mother    Kidney Stones Mother    Lymphoma Mother    Mental illness Mother    Hypertension Father    Kidney Stones Father    Diabetes Maternal Aunt    Diabetes Maternal Uncle    Diabetes Maternal Grandmother    Breast cancer Paternal Grandmother    Mental illness Cousin    Bipolar disorder Daughter    Social History:  Social History   Substance and Sexual Activity  Alcohol Use No   Alcohol/week: 0.0 standard drinks of alcohol   Comment: Socially- seldom     Social History   Substance and Sexual Activity  Drug Use Yes   Types: Marijuana   Comment: daily  Social History   Socioeconomic History   Marital status: Widowed    Spouse name: Not on file   Number of children: 2   Years of education: Not on file   Highest education level: GED or equivalent  Occupational History   Occupation: Un-employed due to bipolar  Tobacco Use   Smoking status: Every Day    Current packs/day: 1.00    Average packs/day: 1 pack/day for 28.8 years (28.8 ttl pk-yrs)    Types: Cigarettes    Start date: 05/18/1995   Smokeless tobacco: Never   Tobacco comments:    smoking cessation information provided in AVS  Vaping Use   Vaping status: Former    Substances: Nicotine   Substance and Sexual Activity   Alcohol use: No    Alcohol/week: 0.0 standard drinks of alcohol    Comment: Socially- seldom   Drug use: Yes    Types: Marijuana    Comment: daily   Sexual activity: Yes    Partners: Male    Birth control/protection: Surgical  Other Topics Concern   Not on file  Social History Narrative   Regular Exercise -  NO   Daily Caffeine  Use:  1 cup coffee in am      1 child, one step child      4 years ago she watched her husband die slowly before her - resulting in her having PTSD            Social Drivers of Health   Financial Resource Strain: Low Risk  (10/19/2021)   Overall Financial Resource Strain (CARDIA)    Difficulty of Paying Living Expenses: Not very hard  Food Insecurity: No Food Insecurity (03/09/2024)   Hunger Vital Sign    Worried About Running Out of Food in the Last Year: Never true    Ran Out of Food in the Last Year: Never true  Transportation Needs: No Transportation Needs (03/09/2024)   PRAPARE - Administrator, Civil Service (Medical): No    Lack of Transportation (Non-Medical): No  Physical Activity: Inactive (10/19/2021)   Exercise Vital Sign    Days of Exercise per Week: 0 days    Minutes of Exercise per Session: 0 min  Stress: Stress Concern Present (10/19/2021)   [employer] of Occupational Health - Occupational Stress Questionnaire    Feeling of Stress : To some extent  Social Connections: Moderately Isolated (10/19/2021)   Social Connection and Isolation Panel    Frequency of Communication with Friends and Family: More than three times a week    Frequency of Social Gatherings with Friends and Family: More than three times a week    Attends Religious Services: More than 4 times per year    Active Member of [employer] or Organizations: No    Attends Banker Meetings: Never    Marital Status: Widowed   Past Medical History:  Past Medical History:  Diagnosis Date    Abnormal vaginal Pap smear    10+ years ago- no colpo repeat was normal   ADHD    Antibiotic-induced yeast infection 08/22/2016   Anxiety    AR (allergic rhinitis)    Bipolar affective (HCC)    pt reported   Bipolar disorder (HCC)    Bursitis of both hips    Calcium , deposits, in bursa    left hip   Eating disorder    Under control per patient   Fibromyalgia 03/06/2024   GERD (gastroesophageal reflux disease)    History of  bulimia nervosa 04/29/2023   Nephrolithiasis 05/23/2012   Painful intercourse    Painful menstrual periods    Pelvic pain in female    PTSD (post-traumatic stress disorder)    Renal disorder    Spinal stenosis    Uterine polyp    VWD (acquired von Willebrand's disease) (HCC)     Past Surgical History:  Procedure Laterality Date   ABDOMINAL HYSTERECTOMY     CYSTOSCOPY WITH STENT PLACEMENT Left 04/19/2017   Procedure: CYSTOSCOPY WITH STENT PLACEMENT;  Surgeon: Carolee Sherwood JONETTA DOUGLAS, MD;  Location: [facility] OR[facility];  Service: Urology;  Laterality: Left;   HYSTEROSCOPY     removed polyps   LAPAROSCOPIC VAGINAL HYSTERECTOMY  2015   at [facility]   LAPAROSCOPY Left 01/10/2015   Procedure: LAPAROSCOPY OPERATIVE with biopsy, left oopherectomy;  Surgeon: Gladis DELENA Dollar, MD;  Location: [facility] OR[facility];  Service: Gynecology;  Laterality: Left;   LAPAROSCOPY ABDOMEN DIAGNOSTIC     OOPHORECTOMY Left    TUBAL LIGATION     URETEROSCOPY WITH HOLMIUM LASER LITHOTRIPSY Left 04/19/2017   Procedure: URETEROSCOPY WITH HOLMIUM LASER LITHOTRIPSY;  Surgeon: Carolee Sherwood JONETTA DOUGLAS, MD;  Location: [facility];  Service: Urology;  Laterality: Left;    Current Medications: Current Facility-Administered Medications  Medication Dose Route Frequency Provider Last Rate Last Admin   acetaminophen  (TYLENOL ) tablet 650 mg  650 mg Oral Q6H PRN Donnelly Mellow, MD       alum & mag hydroxide-simeth (MAALOX/MYLANTA) 200-200-20 MG/5ML suspension 30 mL  30 mL Oral Q4H PRN Celestine Prim, MD       clonazePAM   (KLONOPIN ) tablet 1 mg  1 mg Oral BID Dontell Mian, MD   1 mg at 03/12/24 1743   haloperidol  (HALDOL ) tablet 5 mg  5 mg Oral TID PRN Itzamara Casas, MD       And   diphenhydrAMINE  (BENADRYL ) capsule 50 mg  50 mg Oral TID PRN Anupama Piehl, MD       haloperidol  lactate (HALDOL ) injection 5 mg  5 mg Intramuscular TID PRN Mendi Constable, MD       And   diphenhydrAMINE  (BENADRYL ) injection 50 mg  50 mg Intramuscular TID PRN Laelani Vasko, MD       And   LORazepam  (ATIVAN ) injection 2 mg  2 mg Intramuscular TID PRN Shanetha Bradham, MD       haloperidol  lactate (HALDOL ) injection 10 mg  10 mg Intramuscular TID PRN Akeylah Hendel, MD       And   diphenhydrAMINE  (BENADRYL ) injection 50 mg  50 mg Intramuscular TID PRN Tazaria Dlugosz, MD       And   LORazepam  (ATIVAN ) injection 2 mg  2 mg Intramuscular TID PRN Ashea Winiarski, MD       hydrOXYzine  (ATARAX ) tablet 25 mg  25 mg Oral Q6H PRN Ashana Tullo, MD   25 mg at 03/12/24 2113   magnesium  hydroxide (MILK OF MAGNESIA) suspension 30 mL  30 mL Oral Daily PRN Donnelly Mellow, MD   30 mL at 03/12/24 1025   traZODone  (DESYREL ) tablet 50 mg  50 mg Oral QHS PRN Mariya Mottley, MD   50 mg at 03/12/24 2113    Lab Results:  No results found for this or any previous visit (from the past 48 hours).   Blood Alcohol level:  Lab Results  Component Value Date   ETH 88 (H) 08/07/2020    Metabolic Disorder Labs: Lab Results  Component Value Date   HGBA1C 4.7 (L) 03/10/2024  MPG 88.19 03/10/2024   No results found for: PROLACTIN Lab Results  Component Value Date   CHOL 181 03/10/2024   TRIG 147 03/10/2024   HDL 48 03/10/2024   CHOLHDL 3.8 03/10/2024   VLDL 29 03/10/2024   LDLCALC 104 (H) 03/10/2024   LDLCALC 92 03/06/2016    Physical Findings: AIMS:  , ,  ,  ,    CIWA:    COWS:      Psychiatric Specialty Exam:  Presentation  General Appearance: Appropriate for Environment; Casual  Eye Contact:Fair  Speech:Clear and  Coherent  Speech Volume:Normal    Mood and Affect  Mood:Anxious  Affect:Appropriate   Thought Process  Thought Processes:Coherent  Descriptions of Associations:Intact  Orientation:Full (Time, Place and Person)  Thought Content:Logical  Hallucinations:Hallucinations: None  Ideas of Reference:None  Suicidal Thoughts:Suicidal Thoughts: No  Homicidal Thoughts:Homicidal Thoughts: No   Sensorium  Memory:Immediate Fair; Recent Fair; Remote Fair  Judgment:Impaired  Insight:Shallow   Executive Functions  Concentration:Fair  Attention Span:Poor  Recall:Fair  Fund of Knowledge:Fair  Language:Fair   Psychomotor Activity  Psychomotor Activity:Psychomotor Activity: Normal  Musculoskeletal: Strength & Muscle Tone: within normal limits Gait & Station: normal Assets  Assets:Communication Skills; Desire for Improvement; Physical Health    Physical Exam: Physical Exam Vitals and nursing note reviewed.    ROS Blood pressure (!) 112/59, pulse 84, temperature 97.7 F (36.5 C), resp. rate 18, height 5' 2 (1.575 m), weight 66.4 kg, last menstrual period 09/16/2015, SpO2 99%. Body mass index is 26.77 kg/m.  Diagnosis: Principal Problem:   MDD (major depressive disorder), recurrent severe, without psychosis (HCC) Active Problems:   GAD (generalized anxiety disorder)   PTSD (post-traumatic stress disorder)   Chronic insomnia    Clinical Decision Making: Patient currently admitted for possible overdose on Ambien in the context of psychosocial stressors of conflict with her boyfriend.  Even though patient denies SI/HI/plan discomforting information in ED documentation about patient sending text messages of overdose being a suicide attempt to her boyfriend and her father.  At this time patient will be monitored closely for further safety concerns.  Patient is declining any psychotropic medications at this time.  Treatment Plan Summary:  Safety and  Monitoring: --Voluntary admission to inpatient psychiatric unit for safety, stabilization and treatment             -- Daily contact with patient to assess and evaluate symptoms and progress in treatment             -- Patient's case to be discussed in multi-disciplinary team meeting             -- Observation Level: q15 minute checks             -- Vital signs:  q12 hours             -- Precautions: suicide, elopement, and assault   2. Psychiatric Diagnoses and Treatment: Patient is declining any psychotropic medications for mood stabilization at this time.  Patient is not meeting criteria for nonemergent forced medication.  Patient wants to continue on Klonopin  and do weekly therapy sessions with her therapist of 9 years             -- Will continue Klonopin  1mg  twice a day  -- Ambien 5mg      -- The risks/benefits/side-effects/alternatives to this medication were discussed in detail with the patient and time was given for questions. The patient consents to medication trial.                --  Metabolic profile and EKG monitoring obtained while on an atypical antipsychotic (BMI: Lipid Panel: HbgA1c: QTc:)              -- Encouraged patient to participate in unit milieu and in scheduled group therapies                            3. Medical Issues Being Addressed:       4. Discharge Planning:   -- Social work and case management to assist with discharge planning and identification of hospital follow-up needs prior to discharge  -- Estimated LOS: 3-4 days  Jagar Lua, MD 03/12/2024, 9:46 PM

## 2024-03-12 NOTE — [facility] Suicide Risk Assessment (Signed)
 [facility] INPATIENT:  Family/Significant Other Suicide Prevention Education  Suicide Prevention Education:  Education Completed; Vernell Patten, [phone], Daughter,  (name of family member/significant other) has been identified by the patient as the family member/significant other with whom the patient will be residing, and identified as the person(s) who will aid the patient in the event of a mental health crisis (suicidal ideations/suicide attempt).  With written consent from the patient, the family member/significant other has been provided the following suicide prevention education, prior to the and/or following the discharge of the patient.  The suicide prevention education provided includes the following: Suicide risk factors Suicide prevention and interventions National Suicide Hotline telephone number [facility] assessment telephone number [facility] Emergency Assistance 911 [facility] and/or Residential Mobile Crisis Unit telephone number  Request made of family/significant other to: Remove weapons (e.g., guns, rifles, knives), all items previously/currently identified as safety concern.   Remove drugs/medications (over-the-counter, prescriptions, illicit drugs), all items previously/currently identified as a safety concern.  The family member/significant other verbalizes understanding of the suicide prevention education information provided.  The family member/significant other agrees to remove the items of safety concern listed above.  Sherryle JINNY Margo 03/12/2024, 3:50 PM

## 2024-03-12 NOTE — Group Note (Deleted)
 [facility] LCSW Group Therapy Note   Group Date: 03/12/2024 Start Time: 1305 End Time: 1422   Type of Therapy and Topic: Group Therapy: Avoiding Self-Sabotaging and Enabling Behaviors  Participation Level: {[facility] PARTICIPATION OZCZO:77735}  Mood:  Description of Group:  In this group, patients will learn how to identify obstacles, self-sabotaging and enabling behaviors, as well as: what are they, why do we do them and what needs these behaviors meet. Discuss unhealthy relationships and how to have positive healthy boundaries with those that sabotage and enable. Explore aspects of self-sabotage and enabling in yourself and how to limit these self-destructive behaviors in everyday life.   Therapeutic Goals: 1. Patient will identify one obstacle that relates to self-sabotage and enabling behaviors 2. Patient will identify one personal self-sabotaging or enabling behavior they did prior to admission 3. Patient will state a plan to change the above identified behavior 4. Patient will demonstrate ability to communicate their needs through discussion and/or role play.    Summary of Patient Progress:   ***   Therapeutic Modalities:  Cognitive Behavioral Therapy Person-Centered Therapy Motivational Interviewing    Casey Marzano M Ala Kratz, LCSW

## 2024-03-12 NOTE — Group Note (Signed)
 Date:  03/12/2024 Time:  4:02 PM  Group Topic/Focus:  Goals Group:   The focus of this group is to help patients establish daily goals to achieve during treatment and discuss how the patient can incorporate goal setting into their daily lives to aide in recovery.    Participation Level:  Active  Participation Quality:  Appropriate  Affect:  Appropriate  Cognitive:  Alert  Insight: Appropriate  Engagement in Group:  Engaged  Modes of Intervention:  Activity, Discussion, and Education  Additional Comments:    Malvina Schadler L Elinda Bunten 03/12/2024, 4:02 PM

## 2024-03-12 NOTE — [facility] Counselor (Signed)
 CSW spoke with Vernell Patten, [phone], Daughter.  She reports that patient was admitted because she had drank a couple of glasses of wine, just started a new Ambien prescription and was triggered by an argument with her boyfriend.   She reports that patient is not a danger to self or others.   She denies access to weapons.  She reports no concerns for patient returning home.   Sherryle Margo, MSW, LCSW 03/12/2024 3:52 PM

## 2024-03-12 NOTE — Progress Notes (Signed)
   03/12/24 0050  Psych Admission Type (Psych Patients Only)  Admission Status Involuntary  Psychosocial Assessment  Patient Complaints Anxiety  Eye Contact Fair  Facial Expression Anxious  Affect Flat  Speech Logical/coherent  Interaction Assertive  Motor Activity Fidgety  Appearance/Hygiene Unremarkable  Behavior Characteristics Anxious  Mood Anxious  Aggressive Behavior  Effect No apparent injury  Thought Process  Coherency WDL  Content WDL  Delusions WDL  Perception WDL  Hallucination None reported or observed  Judgment WDL  Confusion WDL  Danger to Self  Current suicidal ideation? Denies  Danger to Others  Danger to Others None reported or observed

## 2024-03-12 NOTE — Group Note (Signed)
 Date:  03/12/2024 Time:  9:21 PM  Group Topic/Focus:  Overcoming Stress:   The focus of this group is to define stress and help patients assess their triggers.    Participation Level:  Active  Participation Quality:  Appropriate  Affect:  Appropriate  Cognitive:  Appropriate  Insight: Appropriate and Good  Engagement in Group:  Limited  Modes of Intervention:  Discussion  Additional Comments:     Kerri Katz 03/12/2024, 9:21 PM

## 2024-03-12 NOTE — Plan of Care (Signed)
  Problem: Education: Goal: Emotional status will improve Outcome: Progressing   Problem: Education: Goal: Mental status will improve Outcome: Progressing   Problem: Activity: Goal: Interest or engagement in activities will improve Outcome: Progressing   Problem: Coping: Goal: Ability to demonstrate self-control will improve Outcome: Progressing   Problem: Health Behavior/Discharge Planning: Goal: Identification of resources available to assist in meeting health care needs will improve Outcome: Progressing   Problem: Self-Concept: Goal: Will verbalize positive feelings about self Outcome: Progressing

## 2024-03-12 NOTE — Progress Notes (Signed)
   03/12/24 1800  Psych Admission Type (Psych Patients Only)  Admission Status Involuntary  Psychosocial Assessment  Patient Complaints Anxiety  Eye Contact Fair  Facial Expression Anxious  Affect Appropriate to circumstance  Speech Logical/coherent  Interaction Assertive  Motor Activity Fidgety  Appearance/Hygiene Unremarkable  Behavior Characteristics Cooperative;Anxious  Mood Anxious;Pleasant  Aggressive Behavior  Effect No apparent injury  Thought Process  Coherency WDL  Content WDL  Delusions None reported or observed  Perception WDL  Hallucination None reported or observed  Judgment WDL  Confusion None  Danger to Self  Current suicidal ideation? Denies  Self-Injurious Behavior No self-injurious ideation or behavior indicators observed or expressed   Agreement Not to Harm Self Yes  Description of Agreement Verbal  Danger to Others  Danger to Others None reported or observed

## 2024-03-12 NOTE — Group Note (Signed)
 Recreation Therapy Group Note   Group Topic:Animal Assisted Therapy   Group Date: 03/12/2024 Start Time: 1000 End Time: 1035 Facilitators: Celestia Jeoffrey BRAVO, LRT, CTRS Location: Courtyard  Group Description: AAA. Animal-Assisted Activity provides opportunities for motivational, educational, therapeutic and/or recreational benefits to enhance quality of life. Selinda Geophysicist/field seismologist) and Rollo (dog) visited the unit to interact with patients.   Goal Areas Addressed:  Reduced anxiety and stress Improved mood Increased social interaction Enhanced communication skills Reduced loneliness and isolation Improved emotional regulation   Affect/Mood: Appropriate   Participation Level: Active   Participation Quality: Independent   Behavior: Appropriate   Speech/Thought Process: Coherent   Insight: Good   Judgement: Good   Modes of Intervention: Activity   Patient Response to Interventions:  Receptive   Education Outcome:  Acknowledges education   Clinical Observations/Individualized Feedback: Ruba was active in their participation of session activities and group discussion. Pt interacted well with LRT and peers duration of session.    Plan: Continue to engage patient in RT group sessions 2-3x/week.   Jeoffrey BRAVO Celestia, LRT, CTRS 03/12/2024 1:04 PM

## 2024-03-13 NOTE — Progress Notes (Signed)
   03/12/24 2349  Psych Admission Type (Psych Patients Only)  Admission Status Involuntary  Psychosocial Assessment  Patient Complaints Anxiety  Eye Contact Fair  Facial Expression Anxious  Affect Flat  Speech Logical/coherent  Interaction Assertive  Motor Activity Fidgety  Appearance/Hygiene Unremarkable  Behavior Characteristics Cooperative;Anxious  Mood Anxious  Aggressive Behavior  Effect No apparent injury  Thought Process  Coherency WDL  Content WDL  Delusions WDL  Perception WDL  Hallucination None reported or observed  Judgment WDL  Confusion WDL  Danger to Self  Current suicidal ideation? Denies  Danger to Others  Danger to Others None reported or observed

## 2024-03-13 NOTE — Group Note (Signed)
 Recreation Therapy Group Note   Group Topic:Leisure Education  Group Date: 03/13/2024 Start Time: 1300 End Time: 1400 Facilitators: Celestia Jeoffrey BRAVO, LRT, CTRS  Location: Craft Room  Group Description: Leisure. Patients were given the option to choose from journaling, coloring, drawing, making origami, playing with playdoh, listening to music or singing karaoke. LRT and pts discussed the meaning of leisure, the importance of participating in leisure during their free time/when they're outside of the hospital, as well as how our leisure interests can also serve as coping skills.   Goal Area(s) Addressed:  Patient will identify a current leisure interest.  Patient will learn the definition of "leisure". Patient will practice making a positive decision. Patient will have the opportunity to try a new leisure activity. Patient will communicate with peers and LRT.    Affect/Mood: Appropriate   Participation Level: Active and Engaged   Participation Quality: Independent   Behavior: Appropriate, Calm, and Cooperative   Speech/Thought Process: Coherent   Insight: Good   Judgement: Good   Modes of Intervention: Clarification, Education, and Exploration   Patient Response to Interventions:  Attentive, Engaged, Interested , and Receptive   Education Outcome:  Acknowledges education   Clinical Observations/Individualized Feedback: Casey Hodges was active in their participation of session activities and group discussion. Pt identified spend time with my daughter and walk her new puppy as things she does in her free time. Pt chose to play with play doh while in group.   Plan: Continue to engage patient in RT group sessions 2-3x/week.   Jeoffrey BRAVO Celestia, LRT, CTRS 03/13/2024 2:04 PM

## 2024-03-13 NOTE — Plan of Care (Signed)

## 2024-03-13 NOTE — Plan of Care (Signed)

## 2024-03-13 NOTE — Progress Notes (Signed)
   03/13/24 0959  Psych Admission Type (Psych Patients Only)  Admission Status Involuntary  Psychosocial Assessment  Patient Complaints Anxiety  Eye Contact Fair  Facial Expression Animated  Affect Appropriate to circumstance  Speech Pressured  Interaction Assertive  Motor Activity Fidgety  Appearance/Hygiene Unremarkable  Behavior Characteristics Cooperative  Mood Anxious  Aggressive Behavior  Effect No apparent injury  Thought Process  Coherency WDL  Content WDL  Delusions None reported or observed  Perception WDL  Hallucination None reported or observed  Judgment WDL  Confusion WDL  Danger to Self  Current suicidal ideation? Denies  Self-Injurious Behavior No self-injurious ideation or behavior indicators observed or expressed   Agreement Not to Harm Self Yes  Description of Agreement verbal  Danger to Others  Danger to Others None reported or observed

## 2024-03-14 NOTE — Progress Notes (Signed)
   03/14/24 0829  Psych Admission Type (Psych Patients Only)  Admission Status Involuntary  Psychosocial Assessment  Patient Complaints Anxiety  Eye Contact Fair  Facial Expression Animated  Affect Appropriate to circumstance  Speech Logical/coherent  Interaction Assertive  Motor Activity Slow  Appearance/Hygiene Unremarkable  Behavior Characteristics Cooperative  Mood Pleasant  Aggressive Behavior  Effect No apparent injury  Thought Process  Coherency WDL  Content WDL  Delusions None reported or observed  Perception WDL  Hallucination None reported or observed  Judgment WDL  Confusion WDL  Danger to Self  Current suicidal ideation? Denies  Self-Injurious Behavior No self-injurious ideation or behavior indicators observed or expressed   Agreement Not to Harm Self Yes  Description of Agreement verbal  Danger to Others  Danger to Others None reported or observed

## 2024-03-14 NOTE — Progress Notes (Signed)
  [facility] Adult Case Management Discharge Plan :  Will you be returning to the same living situation after discharge:  Yes,  residence At discharge, do you have transportation home?: Yes,  Daughter, Vernell Has you have the ability to pay for your medications: Yes,  pt has medical coverage  Release of information consent forms completed and in the chart;  Patient's signature needed at discharge.  Patient to Follow up at:  Follow-up Information     [facility] [address] Follow up.   Why: Provider will give you a call on Monday to follow after your discharge.  She was unable to provide a time. Contact information: [address] [facility] [place] [address] [phone]                 Next level of care provider has access to [facility] Link:no  Safety Planning and Suicide Prevention discussed: Yes,  Completed with adult daughter, Vernell     Has patient been referred to the Quitline?: Patient does not use tobacco/nicotine  products  Patient has been referred for addiction treatment: Patient refused referral for treatment.  Reilley Latorre D Fatime Biswell, LCSW 03/14/2024, 12:14 PM

## 2024-03-14 NOTE — Progress Notes (Signed)
 [facility] MD Progress Note  03/14/2024 12:00 AM KLARYSSA Hodges  MRN:  [id number] "Casey Hodges is a 48 y.o. female presents to the emergency department today after EMS was called to her house for a wellness check.  Patient is not sure who called 911.  She states that she took 20 mg of Ambien tonight.  She says this is a normal amount that she takes although it is more than has been prescribed to her.  Patient denies any attempt to harm herself.  Per chart review the patient does have history of bipolar disorder with depression.  She denies any recent illness". Patient is admitted to adult psych unit with Q15 min safety monitoring. Multidisciplinary team approach is offered. Medication management; group/milieu therapy is offered.   Subjective:  Chart reviewed, case discussed in multidisciplinary meeting, patient seen during rounds.   03/13/24:Patient is noted to be resting in bed due to headache.She reports that her daughter visited her last evening and the visit went well. She is able to identify her daughter and mom as her support system. She denies SI/HI/plan and denies hallucinations. 03/12/24: On interview patient is noted to be talking to another peer.  She offers no complaints.  She continues to remain discharge focused and reports that her daughter and her mother are her great strengths.  She reports that she broke up with her ex-boyfriend and is not planning to involve him in her life.  She did acknowledge that the alleged overdose and the text message to him is to get attention from him.  She denies any SI/HI/plan and denies any auditory/visual hallucinations.  Patient reports fair appetite and sleep.  Provider discussed about the discharge planning on Saturday or Sunday depending on the collateral information from her family.  03/11/24:On interview today patient met with the treatment team and continues to remain focused on discharge planning.  She continues to minimize her presentation but eventually  was able to tell that the text to the boyfriend's probability a little manipulation as she was upset with him and wanted him to know that she was upset.  She initially states that she broke up with her boyfriend but then agreed with the treatment team that she is talking to him every day even on the inpatient unit.  She consistently denies SI/HI/plan and denies hallucinations.  She talks about her mom needing her for wound care and her daughter wanting her home.  Treatment team informed her about the need of reaching out to her daughter or her father for safety information as they both have been involved in the incident that led up to the ED visit.  Patient gave consent for the team to reach out to either her father or her daughter.  She denies auditory/visual hallucinations.  She has fair appetite and sleep.  She is participating in groups.   Sleep: Fair  Appetite:  Fair  Past Psychiatric History: see h&P Family History:  Family History  Problem Relation Age of Onset   Sleep apnea Mother    Hypertension Mother    Kidney Stones Mother    Lymphoma Mother    Mental illness Mother    Hypertension Father    Kidney Stones Father    Diabetes Maternal Aunt    Diabetes Maternal Uncle    Diabetes Maternal Grandmother    Breast cancer Paternal Grandmother    Mental illness Cousin    Bipolar disorder Daughter    Social History:  Social History   Substance and Sexual  Activity  Alcohol Use No   Alcohol/week: 0.0 standard drinks of alcohol   Comment: Socially- seldom     Social History   Substance and Sexual Activity  Drug Use Yes   Types: Marijuana   Comment: daily    Social History   Socioeconomic History   Marital status: Widowed    Spouse name: Not on file   Number of children: 2   Years of education: Not on file   Highest education level: GED or equivalent  Occupational History   Occupation: Un-employed due to bipolar  Tobacco Use   Smoking status: Every Day    Current  packs/day: 1.00    Average packs/day: 1 pack/day for 28.8 years (28.8 ttl pk-yrs)    Types: Cigarettes    Start date: 05/18/1995   Smokeless tobacco: Never   Tobacco comments:    smoking cessation information provided in AVS  Vaping Use   Vaping status: Former   Substances: Nicotine   Substance and Sexual Activity   Alcohol use: No    Alcohol/week: 0.0 standard drinks of alcohol    Comment: Socially- seldom   Drug use: Yes    Types: Marijuana    Comment: daily   Sexual activity: Yes    Partners: Male    Birth control/protection: Surgical  Other Topics Concern   Not on file  Social History Narrative   Regular Exercise -  NO   Daily Caffeine  Use:  1 cup coffee in am      1 child, one step child      4 years ago she watched her husband die slowly before her - resulting in her having PTSD            Social Drivers of Health   Financial Resource Strain: Low Risk  (10/19/2021)   Overall Financial Resource Strain (CARDIA)    Difficulty of Paying Living Expenses: Not very hard  Food Insecurity: No Food Insecurity (03/09/2024)   Hunger Vital Sign    Worried About Running Out of Food in the Last Year: Never true    Ran Out of Food in the Last Year: Never true  Transportation Needs: No Transportation Needs (03/09/2024)   PRAPARE - Administrator, Civil Service (Medical): No    Lack of Transportation (Non-Medical): No  Physical Activity: Inactive (10/19/2021)   Exercise Vital Sign    Days of Exercise per Week: 0 days    Minutes of Exercise per Session: 0 min  Stress: Stress Concern Present (10/19/2021)   [employer] of Occupational Health - Occupational Stress Questionnaire    Feeling of Stress : To some extent  Social Connections: Moderately Isolated (10/19/2021)   Social Connection and Isolation Panel    Frequency of Communication with Friends and Family: More than three times a week    Frequency of Social Gatherings with Friends and Family: More than three  times a week    Attends Religious Services: More than 4 times per year    Active Member of [employer] or Organizations: No    Attends Banker Meetings: Never    Marital Status: Widowed   Past Medical History:  Past Medical History:  Diagnosis Date   Abnormal vaginal Pap smear    10+ years ago- no colpo repeat was normal   ADHD    Antibiotic-induced yeast infection 08/22/2016   Anxiety    AR (allergic rhinitis)    Bipolar affective (HCC)    pt reported   Bipolar disorder (HCC)  Bursitis of both hips    Calcium , deposits, in bursa    left hip   Eating disorder    Under control per patient   Fibromyalgia 03/06/2024   GERD (gastroesophageal reflux disease)    History of bulimia nervosa 04/29/2023   Nephrolithiasis 05/23/2012   Painful intercourse    Painful menstrual periods    Pelvic pain in female    PTSD (post-traumatic stress disorder)    Renal disorder    Spinal stenosis    Uterine polyp    VWD (acquired von Willebrand's disease) (HCC)     Past Surgical History:  Procedure Laterality Date   ABDOMINAL HYSTERECTOMY     CYSTOSCOPY WITH STENT PLACEMENT Left 04/19/2017   Procedure: CYSTOSCOPY WITH STENT PLACEMENT;  Surgeon: Carolee Sherwood JONETTA DOUGLAS, MD;  Location: [facility] OR[facility];  Service: Urology;  Laterality: Left;   HYSTEROSCOPY     removed polyps   LAPAROSCOPIC VAGINAL HYSTERECTOMY  2015   at [facility]   LAPAROSCOPY Left 01/10/2015   Procedure: LAPAROSCOPY OPERATIVE with biopsy, left oopherectomy;  Surgeon: Gladis DELENA Dollar, MD;  Location: [facility] OR[facility];  Service: Gynecology;  Laterality: Left;   LAPAROSCOPY ABDOMEN DIAGNOSTIC     OOPHORECTOMY Left    TUBAL LIGATION     URETEROSCOPY WITH HOLMIUM LASER LITHOTRIPSY Left 04/19/2017   Procedure: URETEROSCOPY WITH HOLMIUM LASER LITHOTRIPSY;  Surgeon: Carolee Sherwood JONETTA DOUGLAS, MD;  Location: [facility] OR[facility];  Service: Urology;  Laterality: Left;    Current Medications: Current Facility-Administered Medications  Medication Dose Route  Frequency Provider Last Rate Last Admin   acetaminophen  (TYLENOL ) tablet 650 mg  650 mg Oral Q6H PRN Lewi Drost, MD   650 mg at 03/13/24 1428   alum & mag hydroxide-simeth (MAALOX/MYLANTA) 200-200-20 MG/5ML suspension 30 mL  30 mL Oral Q4H PRN Rhylan Kagel, MD       clonazePAM  (KLONOPIN ) tablet 1 mg  1 mg Oral BID Clemente Dewey, MD   1 mg at 03/13/24 1714   haloperidol  (HALDOL ) tablet 5 mg  5 mg Oral TID PRN Niamh Rada, MD       And   diphenhydrAMINE  (BENADRYL ) capsule 50 mg  50 mg Oral TID PRN Armya Westerhoff, MD       haloperidol  lactate (HALDOL ) injection 5 mg  5 mg Intramuscular TID PRN Murline Weigel, MD       And   diphenhydrAMINE  (BENADRYL ) injection 50 mg  50 mg Intramuscular TID PRN Markes Shatswell, MD       And   LORazepam  (ATIVAN ) injection 2 mg  2 mg Intramuscular TID PRN Kennedy Brines, MD       haloperidol  lactate (HALDOL ) injection 10 mg  10 mg Intramuscular TID PRN Emylia Latella, MD       And   diphenhydrAMINE  (BENADRYL ) injection 50 mg  50 mg Intramuscular TID PRN Meah Jiron, MD       And   LORazepam  (ATIVAN ) injection 2 mg  2 mg Intramuscular TID PRN Domanik Rainville, MD       hydrOXYzine  (ATARAX ) tablet 25 mg  25 mg Oral Q6H PRN Jasmain Ahlberg, MD   25 mg at 03/13/24 2121   magnesium  hydroxide (MILK OF MAGNESIA) suspension 30 mL  30 mL Oral Daily PRN Donnelly Mellow, MD   30 mL at 03/12/24 1025   traZODone  (DESYREL ) tablet 50 mg  50 mg Oral QHS PRN Rowene Suto, MD   50 mg at 03/13/24 2122    Lab Results:  No results found for this or any  previous visit (from the past 48 hours).   Blood Alcohol level:  Lab Results  Component Value Date   ETH 88 (H) 08/07/2020    Metabolic Disorder Labs: Lab Results  Component Value Date   HGBA1C 4.7 (L) 03/10/2024   MPG 88.19 03/10/2024   No results found for: PROLACTIN Lab Results  Component Value Date   CHOL 181 03/10/2024   TRIG 147 03/10/2024   HDL 48 03/10/2024   CHOLHDL 3.8 03/10/2024    VLDL 29 03/10/2024   LDLCALC 104 (H) 03/10/2024   LDLCALC 92 03/06/2016    Physical Findings: AIMS:  , ,  ,  ,    CIWA:    COWS:      Psychiatric Specialty Exam:  Presentation  General Appearance: Appropriate for Environment; Casual  Eye Contact:Fair  Speech:Clear and Coherent  Speech Volume:Normal    Mood and Affect  Mood:Anxious  Affect:Appropriate   Thought Process  Thought Processes:Coherent  Descriptions of Associations:Intact  Orientation:Full (Time, Place and Person)  Thought Content:Logical  Hallucinations:No data recorded  Ideas of Reference:None  Suicidal Thoughts:No data recorded  Homicidal Thoughts:No data recorded   Sensorium  Memory:Immediate Fair; Recent Fair; Remote Fair  Judgment:Impaired  Insight:Shallow   Executive Functions  Concentration:Fair  Attention Span:Poor  Recall:Fair  Fund of Knowledge:Fair  Language:Fair   Psychomotor Activity  Psychomotor Activity:No data recorded  Musculoskeletal: Strength & Muscle Tone: within normal limits Gait & Station: normal Assets  Assets:Communication Skills; Desire for Improvement; Physical Health    Physical Exam: Physical Exam Vitals and nursing note reviewed.    ROS Blood pressure 99/75, pulse 82, temperature 98.4 F (36.9 C), temperature source Oral, resp. rate 16, height 5' 2 (1.575 m), weight 66.4 kg, last menstrual period 09/16/2015, SpO2 100%. Body mass index is 26.77 kg/m.  Diagnosis: Principal Problem:   MDD (major depressive disorder), recurrent severe, without psychosis (HCC) Active Problems:   GAD (generalized anxiety disorder)   PTSD (post-traumatic stress disorder)   Chronic insomnia    Clinical Decision Making: Patient currently admitted for possible overdose on Ambien in the context of psychosocial stressors of conflict with her boyfriend.  Even though patient denies SI/HI/plan discomforting information in ED documentation about patient  sending text messages of overdose being a suicide attempt to her boyfriend and her father.  At this time patient will be monitored closely for further safety concerns.  Patient is declining any psychotropic medications at this time.  Treatment Plan Summary:  Safety and Monitoring: --Voluntary admission to inpatient psychiatric unit for safety, stabilization and treatment             -- Daily contact with patient to assess and evaluate symptoms and progress in treatment             -- Patient's case to be discussed in multi-disciplinary team meeting             -- Observation Level: q15 minute checks             -- Vital signs:  q12 hours             -- Precautions: suicide, elopement, and assault   2. Psychiatric Diagnoses and Treatment: Patient is declining any psychotropic medications for mood stabilization at this time.  Patient is not meeting criteria for nonemergent forced medication.  Patient wants to continue on Klonopin  and do weekly therapy sessions with her therapist of 9 years             -- Will  continue Klonopin  1mg  twice a day  -- Ambien 5mg      -- The risks/benefits/side-effects/alternatives to this medication were discussed in detail with the patient and time was given for questions. The patient consents to medication trial.                -- Metabolic profile and EKG monitoring obtained while on an atypical antipsychotic (BMI: Lipid Panel: HbgA1c: QTc:)              -- Encouraged patient to participate in unit milieu and in scheduled group therapies                            3. Medical Issues Being Addressed:       4. Discharge Planning:   -- Social work and case management to assist with discharge planning and identification of hospital follow-up needs prior to discharge  -- Estimated LOS: 3-4 days  Athalee Esterline, MD 03/14/2024, 12:00 AM

## 2024-03-14 NOTE — Progress Notes (Signed)
 Patient denies SI/I/AVH at this time. Discharge instructions, AVS, prescriptions, and transition record reviewed with patient. Patient agrees to comply with medication management, follow-up visit and outpatient therapy. Patient belongings returned to patient. Patient questions and concerns addressed and answered. Patient ambulatory off unit. Patient discharged to home. Transportation was provided by her daughter.
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
## 2024-03-14 NOTE — B[facility] Suicide Risk Assessment (Signed)
 [facility] Discharge Suicide Risk Assessment   Principal Problem: MDD (major depressive disorder), recurrent severe, without psychosis (HCC) Discharge Diagnoses: Principal Problem:   MDD (major depressive disorder), recurrent severe, without psychosis (HCC) Active Problems:   GAD (generalized anxiety disorder)   PTSD (post-traumatic stress disorder)   Chronic insomnia   Total Time spent with patient: 30 minutes  Musculoskeletal: Strength & Muscle Tone: within normal limits Gait & Station: normal Patient leans: N/A  Psychiatric Specialty Exam  Presentation  General Appearance:  Appropriate for Environment; Casual  Eye Contact: Fair  Speech: Clear and Coherent  Speech Volume: Normal  Handedness: Right   Mood and Affect  Mood: Anxious  Duration of Depression Symptoms: No data recorded Affect: Appropriate   Thought Process  Thought Processes: Coherent  Descriptions of Associations:Intact  Orientation:Full (Time, Place and Person)  Thought Content:Logical  History of Schizophrenia/Schizoaffective disorder:No data recorded Duration of Psychotic Symptoms:No data recorded Hallucinations:No data recorded Ideas of Reference:None  Suicidal Thoughts:No data recorded Homicidal Thoughts:No data recorded  Sensorium  Memory: Immediate Fair; Recent Fair; Remote Fair  Judgment: Impaired  Insight: Shallow   Executive Functions  Concentration: Fair  Attention Span: Poor  Recall: [employer] of Knowledge: Fair  Language: Fair   Psychomotor Activity  Psychomotor Activity:No data recorded  Assets  Assets: Communication Skills; Desire for Improvement; Physical Health   Sleep  Sleep:No data recorded Estimated Sleeping Duration (Last 24 Hours): 7.50-8.50 hours  Physical Exam: Physical Exam Vitals and nursing note reviewed.    ROS Blood pressure (!) 115/102, pulse 75, temperature (!) 97.3 F (36.3 C), resp. rate 18, height 5' 2 (1.575 m),  weight 66.4 kg, last menstrual period 09/16/2015, SpO2 100%. Body mass index is 26.77 kg/m.  Mental Status Per Nursing Assessment::   On Admission:  Suicidal ideation indicated by others  Demographic Factors:  Caucasian  Loss Factors: Decrease in vocational status  Historical Factors: Impulsivity  Risk Reduction Factors:   Living with another person, especially a relative, Positive social support, Positive therapeutic relationship, and Positive coping skills or problem solving skills  Continued Clinical Symptoms:  Depression:   Impulsivity  Cognitive Features That Contribute To Risk:  None    Suicide Risk:  Minimal: No identifiable suicidal ideation.  Patients presenting with no risk factors but with morbid ruminations; may be classified as minimal risk based on the severity of the depressive symptoms   Follow-up Information     [facility] [address] Follow up.   Why: Provider will give you a call on Monday to follow after your discharge.  She was unable to provide a time. Contact information: [address] [place] [address] [phone]                 Plan Of Care/Follow-up recommendations:  Activity:  as tolerated  Allyn Foil, MD 03/14/2024, 8:46 AM

## 2024-03-14 NOTE — Group Note (Unsigned)
 Date:  03/14/2024 Time:  11:51 AM  Group Topic/Focus:  Emotional Education:   The focus of this group is to discuss what feelings/emotions are, and how they are experienced.     Participation Level:  {BHH PARTICIPATION OZCZO:77735}  Participation Quality:  {BHH PARTICIPATION QUALITY:22265}  Affect:  {BHH AFFECT:22266}  Cognitive:  {BHH COGNITIVE:22267}  Insight: {BHH Insight2:20797}  Engagement in Group:  {BHH ENGAGEMENT IN HMNLE:77731}  Modes of Intervention:  {BHH MODES OF INTERVENTION:22269}  Additional Comments:  ***  Camellia HERO Jaxxen Voong 03/14/2024, 11:51 AM

## 2024-03-14 NOTE — Group Note (Signed)
 Date:  03/14/2024 Time:  11:54 AM  Group Topic/Focus:  Emotional Education:   The focus of this group is to discuss what feelings/emotions are, and how they are experienced.    Participation Level:  Active  Participation Quality:  Appropriate  Affect:  Appropriate  Cognitive:  Appropriate  Insight: Appropriate  Engagement in Group:  Engaged  Modes of Intervention:  Activity  Additional Comments:    Casey Hodges Casey Hodges 03/14/2024, 11:54 AM

## 2024-05-26 ENCOUNTER — Inpatient Hospital Stay

## 2024-05-26 ENCOUNTER — Encounter: Payer: Self-pay | Admitting: Oncology

## 2024-05-26 ENCOUNTER — Inpatient Hospital Stay: Attending: Oncology | Admitting: Oncology

## 2024-05-26 VITALS — BP 118/72 | HR 83 | Temp 97.9°F | Resp 18 | Ht 62.0 in | Wt 152.0 lb

## 2024-05-26 DIAGNOSIS — Z862 Personal history of diseases of the blood and blood-forming organs and certain disorders involving the immune mechanism: Secondary | ICD-10-CM | POA: Diagnosis not present

## 2024-05-26 LAB — CBC WITH DIFFERENTIAL/PLATELET
Abs Immature Granulocytes: 0.03 K/uL (ref 0.00–0.07)
Basophils Absolute: 0.1 K/uL (ref 0.0–0.1)
Basophils Relative: 1 %
Eosinophils Absolute: 0 K/uL (ref 0.0–0.5)
Eosinophils Relative: 0 %
HCT: 42.3 % (ref 36.0–46.0)
Hemoglobin: 14 g/dL (ref 12.0–15.0)
Immature Granulocytes: 0 %
Lymphocytes Relative: 45 %
Lymphs Abs: 3.2 K/uL (ref 0.7–4.0)
MCH: 29.6 pg (ref 26.0–34.0)
MCHC: 33.1 g/dL (ref 30.0–36.0)
MCV: 89.4 fL (ref 80.0–100.0)
Monocytes Absolute: 0.5 K/uL (ref 0.1–1.0)
Monocytes Relative: 7 %
Neutro Abs: 3.4 K/uL (ref 1.7–7.7)
Neutrophils Relative %: 47 %
Platelets: 291 K/uL (ref 150–400)
RBC: 4.73 MIL/uL (ref 3.87–5.11)
RDW: 12.8 % (ref 11.5–15.5)
WBC: 7.1 K/uL (ref 4.0–10.5)
nRBC: 0 % (ref 0.0–0.2)

## 2024-05-26 NOTE — Progress Notes (Unsigned)
 Patient is having severe dizziness. She has some off balance. Back, hip and knee pain, rates at about an 8.

## 2024-05-26 NOTE — Progress Notes (Unsigned)
 [facility]  Telephone:(336) [phone] Fax:(336) [phone]  ID: Casey Hodges OB: 01-10-1976  MR#: [id number]  RDW#:[id number]  Patient Care Team: [facility] as PCP - General Sebastian Sham, CNM as Midwife (Certified Nurse Midwife) Jacobo Casey PARAS, MD as Consulting Physician (Oncology)  CHIEF COMPLAINT: Possible history of von Willebrand's disease.  INTERVAL HISTORY: Patient is a 48 year old female with chronic back pain requiring injections who is referred for evaluation of a possible history of von Willebrand's disease.  Patient reports years ago she required DDAVP for surgical procedure.  She does not report any increased bleeding or bruising.  She currently feels well.  She has no neurologic complaints.  She denies any recent fevers or illnesses.  She has good appetite and denies weight loss.  She has no chest pain, shortness of breath, cough, or hemoptysis.  She denies any nausea, vomiting, constipation, or diarrhea.  She has no urinary complaints.  Patient offers no further specific complaints today.  REVIEW OF SYSTEMS:   Review of Systems  Constitutional: Negative.  Negative for fever, malaise/fatigue and weight loss.  Respiratory: Negative.  Negative for cough, hemoptysis and shortness of breath.   Cardiovascular: Negative.  Negative for chest pain and leg swelling.  Gastrointestinal: Negative.  Negative for abdominal pain.  Genitourinary: Negative.  Negative for dysuria.  Musculoskeletal:  Positive for back pain.  Skin: Negative.  Negative for rash.  Neurological: Negative.  Negative for dizziness, focal weakness, weakness and headaches.  Psychiatric/Behavioral: Negative.  The patient is not nervous/anxious.     As per HPI. Otherwise, a complete review of systems is negative.  PAST MEDICAL HISTORY: Past Medical History:  Diagnosis Date   Abnormal vaginal Pap smear    10+ years ago- no colpo repeat was normal   ADHD     Antibiotic-induced yeast infection 08/22/2016   Anxiety    AR (allergic rhinitis)    Bipolar affective (HCC)    pt reported   Bipolar disorder (HCC)    Bursitis of both hips    Calcium , deposits, in bursa    left hip   Eating disorder    Under control per patient   Fibromyalgia 03/06/2024   GERD (gastroesophageal reflux disease)    History of bulimia nervosa 04/29/2023   Nephrolithiasis 05/23/2012   Painful intercourse    Painful menstrual periods    Pelvic pain in female    PTSD (post-traumatic stress disorder)    Renal disorder    Spinal stenosis    Uterine polyp    VWD (acquired von Willebrand's disease) (HCC)     PAST SURGICAL HISTORY: Past Surgical History:  Procedure Laterality Date   ABDOMINAL HYSTERECTOMY     CYSTOSCOPY WITH STENT PLACEMENT Left 04/19/2017   Procedure: CYSTOSCOPY WITH STENT PLACEMENT;  Surgeon: Carolee Sherwood JONETTA DOUGLAS, MD;  Location: [facility];  Service: Urology;  Laterality: Left;   HYSTEROSCOPY     removed polyps   LAPAROSCOPIC VAGINAL HYSTERECTOMY  2015   at [facility]   LAPAROSCOPY Left 01/10/2015   Procedure: LAPAROSCOPY OPERATIVE with biopsy, left oopherectomy;  Surgeon: Gladis DELENA Dollar, MD;  Location: [facility];  Service: Gynecology;  Laterality: Left;   LAPAROSCOPY ABDOMEN DIAGNOSTIC     OOPHORECTOMY Left    TUBAL LIGATION     URETEROSCOPY WITH HOLMIUM LASER LITHOTRIPSY Left 04/19/2017   Procedure: URETEROSCOPY WITH HOLMIUM LASER LITHOTRIPSY;  Surgeon: Carolee Sherwood JONETTA DOUGLAS, MD;  Location: [facility];  Service: Urology;  Laterality: Left;    FAMILY  HISTORY: Family History  Problem Relation Age of Onset   Sleep apnea Mother    Hypertension Mother    Kidney Stones Mother    Lymphoma Mother    Mental illness Mother    Hypertension Father    Kidney Stones Father    Diabetes Maternal Aunt    Diabetes Maternal Uncle    Diabetes Maternal Grandmother    Breast cancer Paternal Grandmother    Mental illness Cousin    Bipolar disorder Daughter      ADVANCED DIRECTIVES (Y/N):  N  HEALTH MAINTENANCE: Social History   Tobacco Use   Smoking status: Every Day    Current packs/day: 1.00    Average packs/day: 1 pack/day for 29.0 years (29.0 ttl pk-yrs)    Types: Cigarettes    Start date: 05/18/1995   Smokeless tobacco: Never   Tobacco comments:    smoking cessation information provided in AVS  Vaping Use   Vaping status: Former   Substances: Nicotine   Substance Use Topics   Alcohol use: No    Alcohol/week: 0.0 standard drinks of alcohol    Comment: Socially- seldom   Drug use: Yes    Types: Marijuana    Comment: daily     Colonoscopy:  PAP:  Bone density:  Lipid panel:  Allergies  Allergen Reactions   Wellbutrin [Bupropion Hcl] Other (See Comments)    Reaction:  Suicidal    Nitrofurantoin Macrocrystal     Other reaction(s): Not available   Furosemide Rash    Other reaction(s): Not available    Current Outpatient Medications  Medication Sig Dispense Refill   clonazePAM  (KLONOPIN ) 1 MG tablet Take 0.5-1 tablets (0.5-1 mg total) by mouth daily as needed for anxiety. Weaning off     cyclobenzaprine  (FLEXERIL ) 10 MG tablet Take 10 mg by mouth 3 (three) times daily.     hydrOXYzine  (ATARAX ) 25 MG tablet Take 25 mg by mouth 2 (two) times daily.     Vitamin D , Cholecalciferol, 25 MCG (1000 UT) CAPS 1 capsule.     omega-3 acid ethyl esters (LOVAZA) 1 g capsule 2 (two) times daily at 10 AM and 5 PM. (Patient not taking: Reported on 05/26/2024)     traZODone  (DESYREL ) 50 MG tablet Take 50 mg by mouth at bedtime. (Patient not taking: Reported on 05/26/2024)     venlafaxine  XR (EFFEXOR  XR) 37.5 MG 24 hr capsule Take 1 capsule (37.5 mg total) by mouth daily with breakfast. (Patient not taking: Reported on 05/26/2024) 90 capsule 0   No current facility-administered medications for this visit.    OBJECTIVE: Vitals:   05/26/24 1503  BP: 118/72  Pulse: 83  Resp: 18  Temp: 97.9 F (36.6 C)  SpO2: 100%     Body mass index  is 27.8 kg/m.    ECOG FS:0 - Asymptomatic  General: Well-developed, well-nourished, no acute distress. Eyes: Pink conjunctiva, anicteric sclera. HEENT: Normocephalic, moist mucous membranes. Lungs: No audible wheezing or coughing. Heart: Regular rate and rhythm. Abdomen: Soft, nontender, no obvious distention. Musculoskeletal: No edema, cyanosis, or clubbing. Neuro: Alert, answering all questions appropriately. Cranial nerves grossly intact. Skin: No rashes or petechiae noted. Psych: Normal affect. Lymphatics: No cervical, calvicular, axillary or inguinal LAD.   LAB RESULTS:  Lab Results  Component Value Date   NA 141 03/09/2024   K 4.0 03/09/2024   CL 107 03/09/2024   CO2 27 03/09/2024   GLUCOSE 84 03/09/2024   BUN 14 03/09/2024   CREATININE 0.88 03/09/2024   CALCIUM   8.9 03/09/2024   PROT 7.2 03/09/2024   ALBUMIN 3.9 03/09/2024   AST 15 03/09/2024   ALT 11 03/09/2024   ALKPHOS 58 03/09/2024   BILITOT 0.4 03/09/2024   GFRNONAA >60 03/09/2024   GFRAA >60 09/23/2019    Lab Results  Component Value Date   WBC 7.1 05/26/2024   NEUTROABS 3.4 05/26/2024   HGB 14.0 05/26/2024   HCT 42.3 05/26/2024   MCV 89.4 05/26/2024   PLT 291 05/26/2024     STUDIES: No results found.  ASSESSMENT: Possible history of von Willebrand's disease.  PLAN:    Possible history of von Willebrand's disease: Patient reports she was diagnosed with von Willebrand's disease years ago and that she required a nasal spray, possibly DDAVP, prior to her hysterectomy.  She does not report any episodes of bleeding or bruising.  Repeat testing von Willebrand's panel today is completely within normal limits indicating patient does not have this disease.  She has an mildly elevated factor VIII level which is likely clinically insignificant.  No intervention is needed.  Patient does not require treatment.  She is at no further increased risk of bleeding in the general population.  She will have  video-assisted telemedicine visit in 2 weeks to discuss the results.   Spinal injections: Proceed as indicated per orthopedics.  I spent a total of 45 minutes reviewing chart data, face-to-face evaluation with the patient, counseling and coordination of care as detailed above.  Patient expressed understanding and was in agreement with this plan. She also understands that She can call clinic at any time with any questions, concerns, or complaints.     Casey JINNY Reusing, MD   05/27/2024 3:24 PM

## 2024-05-27 LAB — VON WILLEBRAND PANEL
Coagulation Factor VIII: 189 % — ABNORMAL HIGH (ref 56–140)
Ristocetin Co-factor, Plasma: 144 % (ref 50–200)
Von Willebrand Antigen, Plasma: 130 % (ref 50–200)

## 2024-05-27 LAB — COAG STUDIES INTERP REPORT

## 2024-05-28 ENCOUNTER — Telehealth: Payer: Self-pay

## 2024-06-11 ENCOUNTER — Inpatient Hospital Stay: Admitting: Oncology

## 2024-06-11 DIAGNOSIS — Z862 Personal history of diseases of the blood and blood-forming organs and certain disorders involving the immune mechanism: Secondary | ICD-10-CM

## 2024-06-11 NOTE — Progress Notes (Signed)
 [facility]  Telephone:(336) [phone] [phone]) [phone]  ID: Casey Hodges OB: 05-08-1976  MR#: [id number]  RDW#:[id number]  Patient Care Team: [facility] as PCP - General Sebastian Sham, CNM as Midwife (Certified Nurse Midwife) Jacobo Casey PARAS, MD as Consulting Physician (Oncology)  I connected with Casey Hodges on 06/11/2024 at  3:30 PM EST by video enabled telemedicine visit and verified that I am speaking with the correct person using two identifiers.   I discussed the limitations, risks, security and privacy concerns of performing an evaluation and management service by telemedicine and the availability of in-person appointments. I also discussed with the patient that there may be a patient responsible charge related to this service. The patient expressed understanding and agreed to proceed.   Other persons participating in the visit and their role in the encounter: Patient, MD.  Patients location: Home. Providers location: Clinic.  CHIEF COMPLAINT: Possible history of von Willebrand's disease.  INTERVAL HISTORY: Patient agreed to video-assisted telemedicine visit for further evaluation and discussion of her laboratory results.  She currently feels well and is asymptomatic.  She recently received injections in her hips and states her pain has improved. She has no neurologic complaints.  She denies any recent fevers or illnesses.  She has a good appetite and denies weight loss.  She has no chest pain, shortness of breath, cough, or hemoptysis.  She denies any nausea, vomiting, constipation, or diarrhea.  She has no urinary complaints.  Patient offers no specific complaints today.  REVIEW OF SYSTEMS:   Review of Systems  Constitutional: Negative.  Negative for fever, malaise/fatigue and weight loss.  Respiratory: Negative.  Negative for cough, hemoptysis and shortness of breath.   Cardiovascular: Negative.  Negative for chest pain and leg  swelling.  Gastrointestinal: Negative.  Negative for abdominal pain.  Genitourinary: Negative.  Negative for dysuria.  Musculoskeletal:  Positive for back pain.  Skin: Negative.  Negative for rash.  Neurological: Negative.  Negative for dizziness, focal weakness, weakness and headaches.  Psychiatric/Behavioral: Negative.  The patient is not nervous/anxious.     As per HPI. Otherwise, a complete review of systems is negative.  PAST MEDICAL HISTORY: Past Medical History:  Diagnosis Date   Abnormal vaginal Pap smear    10+ years ago- no colpo repeat was normal   ADHD    Antibiotic-induced yeast infection 08/22/2016   Anxiety    AR (allergic rhinitis)    Bipolar affective (HCC)    pt reported   Bipolar disorder (HCC)    Bursitis of both hips    Calcium , deposits, in bursa    left hip   Eating disorder    Under control per patient   Fibromyalgia 03/06/2024   GERD (gastroesophageal reflux disease)    History of bulimia nervosa 04/29/2023   Nephrolithiasis 05/23/2012   Painful intercourse    Painful menstrual periods    Pelvic pain in female    PTSD (post-traumatic stress disorder)    Renal disorder    Spinal stenosis    Uterine polyp    VWD (acquired von Willebrand's disease) (HCC)     PAST SURGICAL HISTORY: Past Surgical History:  Procedure Laterality Date   ABDOMINAL HYSTERECTOMY     CYSTOSCOPY WITH STENT PLACEMENT Left 04/19/2017   Procedure: CYSTOSCOPY WITH STENT PLACEMENT;  Surgeon: Carolee Sherwood JONETTA DOUGLAS, MD;  Location: [facility];  Service: Urology;  Laterality: Left;   HYSTEROSCOPY     removed polyps   LAPAROSCOPIC VAGINAL HYSTERECTOMY  2015   at [facility]   LAPAROSCOPY Left 01/10/2015   Procedure: LAPAROSCOPY OPERATIVE with biopsy, left oopherectomy;  Surgeon: Gladis DELENA Dollar, MD;  Location: [facility];  Service: Gynecology;  Laterality: Left;   LAPAROSCOPY ABDOMEN DIAGNOSTIC     OOPHORECTOMY Left    TUBAL LIGATION     URETEROSCOPY WITH HOLMIUM LASER  LITHOTRIPSY Left 04/19/2017   Procedure: URETEROSCOPY WITH HOLMIUM LASER LITHOTRIPSY;  Surgeon: Carolee Sherwood JONETTA DOUGLAS, MD;  Location: [facility];  Service: Urology;  Laterality: Left;    FAMILY HISTORY: Family History  Problem Relation Age of Onset   Sleep apnea Mother    Hypertension Mother    Kidney Stones Mother    Lymphoma Mother    Mental illness Mother    Hypertension Father    Kidney Stones Father    Diabetes Maternal Aunt    Diabetes Maternal Uncle    Diabetes Maternal Grandmother    Breast cancer Paternal Grandmother    Mental illness Cousin    Bipolar disorder Daughter     ADVANCED DIRECTIVES (Y/N):  N  HEALTH MAINTENANCE: Social History   Tobacco Use   Smoking status: Every Day    Current packs/day: 1.00    Average packs/day: 1 pack/day for 29.1 years (29.1 ttl pk-yrs)    Types: Cigarettes    Start date: 05/18/1995   Smokeless tobacco: Never   Tobacco comments:    smoking cessation information provided in AVS  Vaping Use   Vaping status: Former   Substances: Nicotine   Substance Use Topics   Alcohol use: No    Alcohol/week: 0.0 standard drinks of alcohol    Comment: Socially- seldom   Drug use: Yes    Types: Marijuana    Comment: daily     Colonoscopy:  PAP:  Bone density:  Lipid panel:  Allergies  Allergen Reactions   Wellbutrin [Bupropion Hcl] Other (See Comments)    Reaction:  Suicidal    Nitrofurantoin Macrocrystal     Other reaction(s): Not available   Furosemide Rash    Other reaction(s): Not available    Current Outpatient Medications  Medication Sig Dispense Refill   clonazePAM  (KLONOPIN ) 1 MG tablet Take 0.5-1 tablets (0.5-1 mg total) by mouth daily as needed for anxiety. Weaning off     cyclobenzaprine  (FLEXERIL ) 10 MG tablet Take 10 mg by mouth 3 (three) times daily.     hydrOXYzine  (ATARAX ) 25 MG tablet Take 25 mg by mouth 2 (two) times daily.     omega-3 acid ethyl esters (LOVAZA) 1 g capsule 2 (two) times daily at 10 AM and 5 PM.  (Patient not taking: Reported on 06/11/2024)     traZODone  (DESYREL ) 50 MG tablet Take 50 mg by mouth at bedtime. (Patient not taking: Reported on 06/11/2024)     venlafaxine  XR (EFFEXOR  XR) 37.5 MG 24 hr capsule Take 1 capsule (37.5 mg total) by mouth daily with breakfast. (Patient not taking: Reported on 06/11/2024) 90 capsule 0   No current facility-administered medications for this visit.    OBJECTIVE: There were no vitals filed for this visit.    There is no height or weight on file to calculate BMI.    ECOG FS:0 - Asymptomatic  General: Well-developed, well-nourished, no acute distress. HEENT: Normocephalic. Neuro: Alert, answering all questions appropriately. Cranial nerves grossly intact. Psych: Normal affect.  LAB RESULTS:  Lab Results  Component Value Date   NA 141 03/09/2024   K 4.0 03/09/2024   CL 107 03/09/2024  CO2 27 03/09/2024   GLUCOSE 84 03/09/2024   BUN 14 03/09/2024   CREATININE 0.88 03/09/2024   CALCIUM  8.9 03/09/2024   PROT 7.2 03/09/2024   ALBUMIN 3.9 03/09/2024   AST 15 03/09/2024   ALT 11 03/09/2024   ALKPHOS 58 03/09/2024   BILITOT 0.4 03/09/2024   GFRNONAA >60 03/09/2024   GFRAA >60 09/23/2019    Lab Results  Component Value Date   WBC 7.1 05/26/2024   NEUTROABS 3.4 05/26/2024   HGB 14.0 05/26/2024   HCT 42.3 05/26/2024   MCV 89.4 05/26/2024   PLT 291 05/26/2024     STUDIES: No results found.  ASSESSMENT: Possible history of von Willebrand's disease.  PLAN:    Possible history of von Willebrand's disease: Patient reports she was diagnosed with von Willebrand's disease years ago and that she required a nasal spray, possibly DDAVP, prior to her hysterectomy.  She does not report any episodes of bleeding or bruising.  Repeat testing von Willebrand's panel today is completely within normal limits indicating patient does not have this disease.  She has an mildly elevated factor VIII level which is likely clinically insignificant.  No  intervention is needed.  Patient does not require treatment.  She is at no further increased risk of bleeding in the general population.  No further follow-up is necessary. Spinal injections: Proceed as indicated per orthopedics.  I provided 20 minutes of face-to-face video visit time during this encounter which included chart review, counseling, and coordination of care as documented above.   Patient expressed understanding and was in agreement with this plan. She also understands that She can call clinic at any time with any questions, concerns, or complaints.     Casey JINNY Reusing, MD   06/11/2024 4:12 PM

## 2024-06-11 NOTE — Progress Notes (Signed)
 Patient is doing ok, no new symptoms to report today.

## 2024-06-22 ENCOUNTER — Other Ambulatory Visit: Payer: Self-pay | Admitting: Physical Medicine & Rehabilitation

## 2024-06-22 DIAGNOSIS — G8929 Other chronic pain: Secondary | ICD-10-CM
